# Patient Record
Sex: Male | Born: 1945 | Race: Black or African American | Hispanic: No | Marital: Single | Smoking: Former smoker
Health system: Southern US, Community
[De-identification: ages and names within clinical notes are randomized; demographics above are authoritative.]

## PROBLEM LIST (undated history)

## (undated) DIAGNOSIS — J9311 Primary spontaneous pneumothorax: Secondary | ICD-10-CM

## (undated) DIAGNOSIS — J439 Emphysema, unspecified: Secondary | ICD-10-CM

## (undated) DIAGNOSIS — Z789 Other specified health status: Secondary | ICD-10-CM

## (undated) DIAGNOSIS — R918 Other nonspecific abnormal finding of lung field: Secondary | ICD-10-CM

## (undated) DIAGNOSIS — Z87898 Personal history of other specified conditions: Secondary | ICD-10-CM

## (undated) DIAGNOSIS — F32A Depression, unspecified: Secondary | ICD-10-CM

## (undated) DIAGNOSIS — F200 Paranoid schizophrenia: Secondary | ICD-10-CM

## (undated) DIAGNOSIS — C3490 Malignant neoplasm of unspecified part of unspecified bronchus or lung: Secondary | ICD-10-CM

## (undated) DIAGNOSIS — Z5189 Encounter for other specified aftercare: Secondary | ICD-10-CM

## (undated) DIAGNOSIS — F1010 Alcohol abuse, uncomplicated: Secondary | ICD-10-CM

## (undated) DIAGNOSIS — T7840XA Allergy, unspecified, initial encounter: Secondary | ICD-10-CM

## (undated) DIAGNOSIS — K219 Gastro-esophageal reflux disease without esophagitis: Secondary | ICD-10-CM

## (undated) DIAGNOSIS — D689 Coagulation defect, unspecified: Secondary | ICD-10-CM

## (undated) DIAGNOSIS — F419 Anxiety disorder, unspecified: Secondary | ICD-10-CM

## (undated) DIAGNOSIS — F329 Major depressive disorder, single episode, unspecified: Secondary | ICD-10-CM

## (undated) DIAGNOSIS — D649 Anemia, unspecified: Secondary | ICD-10-CM

## (undated) DIAGNOSIS — F17200 Nicotine dependence, unspecified, uncomplicated: Secondary | ICD-10-CM

## (undated) DIAGNOSIS — E785 Hyperlipidemia, unspecified: Secondary | ICD-10-CM

## (undated) HISTORY — DX: Emphysema, unspecified: J43.9

## (undated) HISTORY — DX: Anemia, unspecified: D64.9

## (undated) HISTORY — PX: COLONOSCOPY: SHX174

## (undated) HISTORY — DX: Other specified health status: Z78.9

## (undated) HISTORY — DX: Major depressive disorder, single episode, unspecified: F32.9

## (undated) HISTORY — DX: Anxiety disorder, unspecified: F41.9

## (undated) HISTORY — DX: Coagulation defect, unspecified: D68.9

## (undated) HISTORY — DX: Hyperlipidemia, unspecified: E78.5

## (undated) HISTORY — DX: Gastro-esophageal reflux disease without esophagitis: K21.9

## (undated) HISTORY — DX: Encounter for other specified aftercare: Z51.89

## (undated) HISTORY — DX: Depression, unspecified: F32.A

## (undated) HISTORY — PX: APPENDECTOMY: SHX54

## (undated) HISTORY — PX: CHEST TUBE INSERTION: SHX231

## (undated) HISTORY — PX: INSERTION CENTRAL VENOUS ACCESS DEVICE W/ SUBCUTANEOUS PORT: SUR725

## (undated) HISTORY — DX: Malignant neoplasm of unspecified part of unspecified bronchus or lung: C34.90

---

## 2003-01-24 ENCOUNTER — Emergency Department (HOSPITAL_COMMUNITY): Admission: EM | Admit: 2003-01-24 | Discharge: 2003-01-24 | Payer: Self-pay | Admitting: Emergency Medicine

## 2004-02-15 ENCOUNTER — Ambulatory Visit: Payer: Self-pay | Admitting: Family Medicine

## 2004-08-12 ENCOUNTER — Ambulatory Visit: Payer: Self-pay | Admitting: Family Medicine

## 2004-10-18 ENCOUNTER — Ambulatory Visit: Payer: Self-pay | Admitting: Family Medicine

## 2004-11-16 ENCOUNTER — Ambulatory Visit: Payer: Self-pay | Admitting: Family Medicine

## 2005-01-04 ENCOUNTER — Ambulatory Visit: Payer: Self-pay | Admitting: Family Medicine

## 2005-01-04 ENCOUNTER — Ambulatory Visit (HOSPITAL_COMMUNITY): Admission: RE | Admit: 2005-01-04 | Discharge: 2005-01-04 | Payer: Self-pay | Admitting: Family Medicine

## 2005-02-06 ENCOUNTER — Ambulatory Visit: Payer: Self-pay | Admitting: Family Medicine

## 2005-03-17 ENCOUNTER — Ambulatory Visit (HOSPITAL_COMMUNITY): Admission: RE | Admit: 2005-03-17 | Discharge: 2005-03-17 | Payer: Self-pay | Admitting: Gastroenterology

## 2005-04-14 ENCOUNTER — Ambulatory Visit: Payer: Self-pay | Admitting: Internal Medicine

## 2005-05-03 ENCOUNTER — Ambulatory Visit (HOSPITAL_COMMUNITY): Admission: RE | Admit: 2005-05-03 | Discharge: 2005-05-03 | Payer: Self-pay | Admitting: Internal Medicine

## 2005-05-16 ENCOUNTER — Ambulatory Visit: Payer: Self-pay | Admitting: Family Medicine

## 2005-09-18 ENCOUNTER — Ambulatory Visit: Payer: Self-pay | Admitting: Family Medicine

## 2005-10-23 ENCOUNTER — Ambulatory Visit: Payer: Self-pay | Admitting: Family Medicine

## 2005-11-03 ENCOUNTER — Encounter: Payer: Self-pay | Admitting: Vascular Surgery

## 2005-11-03 ENCOUNTER — Ambulatory Visit: Admission: RE | Admit: 2005-11-03 | Discharge: 2005-11-03 | Payer: Self-pay | Admitting: Family Medicine

## 2005-11-03 ENCOUNTER — Ambulatory Visit: Payer: Self-pay | Admitting: Family Medicine

## 2005-11-20 ENCOUNTER — Ambulatory Visit: Payer: Self-pay | Admitting: Family Medicine

## 2006-02-19 ENCOUNTER — Ambulatory Visit: Payer: Self-pay | Admitting: Family Medicine

## 2006-03-02 ENCOUNTER — Ambulatory Visit: Payer: Self-pay | Admitting: Family Medicine

## 2006-04-23 ENCOUNTER — Ambulatory Visit: Payer: Self-pay | Admitting: Family Medicine

## 2006-08-27 ENCOUNTER — Ambulatory Visit: Payer: Self-pay | Admitting: Family Medicine

## 2006-12-11 ENCOUNTER — Ambulatory Visit: Payer: Self-pay | Admitting: Family Medicine

## 2006-12-19 DIAGNOSIS — I6529 Occlusion and stenosis of unspecified carotid artery: Secondary | ICD-10-CM | POA: Insufficient documentation

## 2006-12-19 DIAGNOSIS — E785 Hyperlipidemia, unspecified: Secondary | ICD-10-CM | POA: Insufficient documentation

## 2006-12-19 DIAGNOSIS — Z87898 Personal history of other specified conditions: Secondary | ICD-10-CM | POA: Insufficient documentation

## 2006-12-19 DIAGNOSIS — F1910 Other psychoactive substance abuse, uncomplicated: Secondary | ICD-10-CM | POA: Insufficient documentation

## 2006-12-19 HISTORY — DX: Personal history of other specified conditions: Z87.898

## 2007-02-18 ENCOUNTER — Ambulatory Visit: Payer: Self-pay | Admitting: Family Medicine

## 2007-02-18 DIAGNOSIS — I672 Cerebral atherosclerosis: Secondary | ICD-10-CM | POA: Insufficient documentation

## 2007-02-18 DIAGNOSIS — R634 Abnormal weight loss: Secondary | ICD-10-CM | POA: Insufficient documentation

## 2007-02-18 LAB — CONVERTED CEMR LAB
ALT: 11 units/L (ref 0–53)
AST: 25 units/L (ref 0–37)
Albumin: 4.7 g/dL (ref 3.5–5.2)
Alkaline Phosphatase: 63 units/L (ref 39–117)
BUN: 12 mg/dL (ref 6–23)
CO2: 22 meq/L (ref 19–32)
Calcium: 9.4 mg/dL (ref 8.4–10.5)
Chloride: 104 meq/L (ref 96–112)
Cholesterol: 148 mg/dL (ref 0–200)
Creatinine, Ser: 0.97 mg/dL (ref 0.40–1.50)
Glucose, Bld: 80 mg/dL (ref 70–99)
HDL: 78 mg/dL (ref >39)
LDL Cholesterol: 55 mg/dL (ref 0–99)
Potassium: 4.6 meq/L (ref 3.5–5.3)
Sodium: 139 meq/L (ref 135–145)
TSH: 1.298 microintl units/mL (ref 0.350–5.50)
Total Bilirubin: 0.3 mg/dL (ref 0.3–1.2)
Total CHOL/HDL Ratio: 1.9
Total Protein: 8.2 g/dL (ref 6.0–8.3)
Triglycerides: 77 mg/dL (ref <150)
VLDL: 15 mg/dL (ref 0–40)

## 2007-02-22 ENCOUNTER — Telehealth (INDEPENDENT_AMBULATORY_CARE_PROVIDER_SITE_OTHER): Payer: Self-pay | Admitting: Internal Medicine

## 2007-04-23 ENCOUNTER — Ambulatory Visit: Payer: Self-pay | Admitting: Family Medicine

## 2007-11-11 ENCOUNTER — Ambulatory Visit: Payer: Self-pay | Admitting: Family Medicine

## 2007-11-12 ENCOUNTER — Ambulatory Visit: Payer: Self-pay | Admitting: Family Medicine

## 2007-11-12 DIAGNOSIS — D649 Anemia, unspecified: Secondary | ICD-10-CM | POA: Insufficient documentation

## 2007-11-12 LAB — CONVERTED CEMR LAB
ALT: 8 units/L (ref 0–53)
AST: 21 units/L (ref 0–37)
Albumin: 4 g/dL (ref 3.5–5.2)
Alkaline Phosphatase: 75 units/L (ref 39–117)
BUN: 8 mg/dL (ref 6–23)
Basophils Absolute: 0 10*3/uL (ref 0.0–0.1)
Basophils Absolute: 0 10*3/uL (ref 0.0–0.1)
Basophils Relative: 1 % (ref 0–1)
Basophils Relative: 0 % (ref 0–1)
CO2: 19 meq/L (ref 19–32)
Calcium: 8.6 mg/dL (ref 8.4–10.5)
Chloride: 107 meq/L (ref 96–112)
Cholesterol: 147 mg/dL (ref 0–200)
Creatinine, Ser: 0.77 mg/dL (ref 0.40–1.50)
Eosinophils Absolute: 0.3 10*3/uL (ref 0.0–0.7)
Eosinophils Absolute: 0.2 10*3/uL (ref 0.0–0.7)
Eosinophils Relative: 5 % (ref 0–5)
Eosinophils Relative: 4 % (ref 0–5)
Glucose, Bld: 85 mg/dL (ref 70–99)
HCT: 23.9 % — ABNORMAL LOW (ref 39.0–52.0)
HCT: 27 %
HCT: 25.5 % — ABNORMAL LOW (ref 39.0–52.0)
HDL: 72 mg/dL (ref >39)
Hemoglobin: 6.6 g/dL — CL (ref 13.0–17.0)
Hemoglobin: 6.6 g/dL — CL (ref 13.0–17.0)
Iron: 12 ug/dL — ABNORMAL LOW (ref 42–165)
LDL Cholesterol: 58 mg/dL (ref 0–99)
Lymphocytes Relative: 29 % (ref 12–46)
Lymphocytes Relative: 22 % (ref 12–46)
Lymphs Abs: 1.6 10*3/uL (ref 0.7–4.0)
Lymphs Abs: 1.3 10*3/uL (ref 0.7–4.0)
MCHC: 27.6 g/dL — ABNORMAL LOW (ref 30.0–36.0)
MCHC: 25.9 g/dL — ABNORMAL LOW (ref 30.0–36.0)
MCV: 74.5 fL — ABNORMAL LOW (ref 78.0–100.0)
MCV: 74.8 fL — ABNORMAL LOW (ref 78.0–100.0)
Monocytes Absolute: 0.5 10*3/uL (ref 0.1–1.0)
Monocytes Absolute: 0.5 10*3/uL (ref 0.1–1.0)
Monocytes Relative: 9 % (ref 3–12)
Monocytes Relative: 9 % (ref 3–12)
Neutro Abs: 3.1 10*3/uL (ref 1.7–7.7)
Neutro Abs: 3.7 10*3/uL (ref 1.7–7.7)
Neutrophils Relative %: 57 % (ref 43–77)
Neutrophils Relative %: 65 % (ref 43–77)
Platelets: 308 10*3/uL (ref 150–400)
Platelets: 344 10*3/uL (ref 150–400)
Potassium: 4.7 meq/L (ref 3.5–5.3)
RBC: 3.21 M/uL — ABNORMAL LOW (ref 4.22–5.81)
RBC: 3.41 M/uL — ABNORMAL LOW (ref 4.22–5.81)
RDW: 20.7 % — ABNORMAL HIGH (ref 11.5–15.5)
RDW: 21 % — ABNORMAL HIGH (ref 11.5–15.5)
Retic Ct Pct: 1.2 % (ref 0.4–3.1)
Saturation Ratios: 3 % — ABNORMAL LOW (ref 20–55)
Sodium: 140 meq/L (ref 135–145)
TIBC: 402 ug/dL (ref 215–435)
TSH: 2.632 microintl units/mL (ref 0.350–4.50)
Total Bilirubin: 0.2 mg/dL — ABNORMAL LOW (ref 0.3–1.2)
Total CHOL/HDL Ratio: 2
Total Protein: 7.4 g/dL (ref 6.0–8.3)
Triglycerides: 87 mg/dL (ref <150)
UIBC: 390 ug/dL
VLDL: 17 mg/dL (ref 0–40)
WBC: 5.5 10*3/uL (ref 4.0–10.5)
WBC: 5.7 10*3/uL (ref 4.0–10.5)

## 2007-11-13 ENCOUNTER — Encounter (HOSPITAL_COMMUNITY): Admission: RE | Admit: 2007-11-13 | Discharge: 2008-01-08 | Payer: Self-pay | Admitting: Family Medicine

## 2008-03-04 ENCOUNTER — Ambulatory Visit: Payer: Self-pay | Admitting: Family Medicine

## 2008-03-04 ENCOUNTER — Encounter (INDEPENDENT_AMBULATORY_CARE_PROVIDER_SITE_OTHER): Payer: Self-pay | Admitting: *Deleted

## 2008-03-05 ENCOUNTER — Telehealth (INDEPENDENT_AMBULATORY_CARE_PROVIDER_SITE_OTHER): Payer: Self-pay | Admitting: Family Medicine

## 2008-03-05 LAB — CONVERTED CEMR LAB
Basophils Absolute: 0 10*3/uL (ref 0.0–0.1)
Basophils Relative: 1 % (ref 0–1)
Eosinophils Absolute: 0.2 10*3/uL (ref 0.0–0.7)
Eosinophils Relative: 3 % (ref 0–5)
HCT: 22.4 % — ABNORMAL LOW (ref 39.0–52.0)
Hemoglobin: 6 g/dL — CL (ref 13.0–17.0)
Lymphocytes Relative: 23 % (ref 12–46)
Lymphs Abs: 1.6 10*3/uL (ref 0.7–4.0)
MCHC: 26.8 g/dL — ABNORMAL LOW (ref 30.0–36.0)
MCV: 78.6 fL (ref 78.0–100.0)
Monocytes Absolute: 0.6 10*3/uL (ref 0.1–1.0)
Monocytes Relative: 8 % (ref 3–12)
Neutro Abs: 4.6 10*3/uL (ref 1.7–7.7)
Neutrophils Relative %: 66 % (ref 43–77)
Platelets: 330 10*3/uL (ref 150–400)
RBC: 2.85 M/uL — ABNORMAL LOW (ref 4.22–5.81)
RDW: 19.4 % — ABNORMAL HIGH (ref 11.5–15.5)
WBC: 6.9 10*3/uL (ref 4.0–10.5)

## 2008-03-06 ENCOUNTER — Encounter (INDEPENDENT_AMBULATORY_CARE_PROVIDER_SITE_OTHER): Payer: Self-pay | Admitting: Family Medicine

## 2008-03-09 ENCOUNTER — Ambulatory Visit (HOSPITAL_COMMUNITY): Admission: RE | Admit: 2008-03-09 | Discharge: 2008-03-09 | Payer: Self-pay | Admitting: Family Medicine

## 2008-03-16 ENCOUNTER — Encounter (INDEPENDENT_AMBULATORY_CARE_PROVIDER_SITE_OTHER): Payer: Self-pay | Admitting: Family Medicine

## 2008-03-16 ENCOUNTER — Ambulatory Visit (HOSPITAL_COMMUNITY): Admission: RE | Admit: 2008-03-16 | Discharge: 2008-03-16 | Payer: Self-pay | Admitting: Gastroenterology

## 2008-03-16 DIAGNOSIS — Q279 Congenital malformation of peripheral vascular system, unspecified: Secondary | ICD-10-CM | POA: Insufficient documentation

## 2008-03-20 ENCOUNTER — Telehealth (INDEPENDENT_AMBULATORY_CARE_PROVIDER_SITE_OTHER): Payer: Self-pay | Admitting: Family Medicine

## 2008-05-25 ENCOUNTER — Telehealth (INDEPENDENT_AMBULATORY_CARE_PROVIDER_SITE_OTHER): Payer: Self-pay | Admitting: Family Medicine

## 2008-05-29 ENCOUNTER — Encounter (INDEPENDENT_AMBULATORY_CARE_PROVIDER_SITE_OTHER): Payer: Self-pay | Admitting: Family Medicine

## 2008-08-26 ENCOUNTER — Ambulatory Visit: Payer: Self-pay | Admitting: Family Medicine

## 2008-08-27 ENCOUNTER — Encounter (INDEPENDENT_AMBULATORY_CARE_PROVIDER_SITE_OTHER): Payer: Self-pay | Admitting: Family Medicine

## 2008-08-27 LAB — CONVERTED CEMR LAB
ALT: 11 units/L (ref 0–53)
AST: 23 units/L (ref 0–37)
Albumin: 4.2 g/dL (ref 3.5–5.2)
Alkaline Phosphatase: 74 units/L (ref 39–117)
BUN: 9 mg/dL (ref 6–23)
Basophils Absolute: 0 10*3/uL (ref 0.0–0.1)
Basophils Relative: 0 % (ref 0–1)
CO2: 26 meq/L (ref 19–32)
Calcium: 9.5 mg/dL (ref 8.4–10.5)
Chloride: 104 meq/L (ref 96–112)
Cholesterol: 188 mg/dL (ref 0–200)
Creatinine, Ser: 0.97 mg/dL (ref 0.40–1.50)
Eosinophils Absolute: 0.2 10*3/uL (ref 0.0–0.7)
Eosinophils Relative: 2 % (ref 0–5)
Glucose, Bld: 86 mg/dL (ref 70–99)
HCT: 31.3 % — ABNORMAL LOW (ref 39.0–52.0)
HDL: 75 mg/dL (ref >39)
Hemoglobin: 9.5 g/dL — ABNORMAL LOW (ref 13.0–17.0)
LDL Cholesterol: 97 mg/dL (ref 0–99)
Lymphocytes Relative: 17 % (ref 12–46)
Lymphs Abs: 1.3 10*3/uL (ref 0.7–4.0)
MCHC: 30.4 g/dL (ref 30.0–36.0)
MCV: 97.2 fL (ref 78.0–100.0)
Monocytes Absolute: 0.6 10*3/uL (ref 0.1–1.0)
Monocytes Relative: 7 % (ref 3–12)
Neutro Abs: 6 10*3/uL (ref 1.7–7.7)
Neutrophils Relative %: 74 % (ref 43–77)
Platelets: 318 10*3/uL (ref 150–400)
Potassium: 4.9 meq/L (ref 3.5–5.3)
RBC: 3.22 M/uL — ABNORMAL LOW (ref 4.22–5.81)
RDW: 19.6 % — ABNORMAL HIGH (ref 11.5–15.5)
Sodium: 141 meq/L (ref 135–145)
Total Bilirubin: 0.2 mg/dL — ABNORMAL LOW (ref 0.3–1.2)
Total CHOL/HDL Ratio: 2.5
Total Protein: 7.5 g/dL (ref 6.0–8.3)
Triglycerides: 79 mg/dL (ref <150)
VLDL: 16 mg/dL (ref 0–40)
WBC: 8.1 10*3/uL (ref 4.0–10.5)

## 2008-11-19 ENCOUNTER — Ambulatory Visit: Payer: Self-pay | Admitting: Nurse Practitioner

## 2008-11-19 DIAGNOSIS — R040 Epistaxis: Secondary | ICD-10-CM | POA: Insufficient documentation

## 2009-01-25 ENCOUNTER — Telehealth: Payer: Self-pay | Admitting: Physician Assistant

## 2009-01-26 ENCOUNTER — Ambulatory Visit: Payer: Self-pay | Admitting: Physician Assistant

## 2009-01-28 ENCOUNTER — Encounter: Payer: Self-pay | Admitting: Physician Assistant

## 2009-01-28 LAB — CONVERTED CEMR LAB
ALT: 13 units/L (ref 0–53)
AST: 24 units/L (ref 0–37)
Albumin: 4.4 g/dL (ref 3.5–5.2)
Alkaline Phosphatase: 66 units/L (ref 39–117)
BUN: 10 mg/dL (ref 6–23)
Basophils Absolute: 0 10*3/uL (ref 0.0–0.1)
Basophils Relative: 0 % (ref 0–1)
CO2: 23 meq/L (ref 19–32)
Calcium: 8.9 mg/dL (ref 8.4–10.5)
Chloride: 106 meq/L (ref 96–112)
Cholesterol: 142 mg/dL (ref 0–200)
Creatinine, Ser: 0.9 mg/dL (ref 0.40–1.50)
Eosinophils Absolute: 0.1 10*3/uL (ref 0.0–0.7)
Eosinophils Relative: 2 % (ref 0–5)
Glucose, Bld: 91 mg/dL (ref 70–99)
HCT: 35.8 % — ABNORMAL LOW (ref 39.0–52.0)
HDL: 80 mg/dL (ref >39)
Hemoglobin: 11.8 g/dL — ABNORMAL LOW (ref 13.0–17.0)
LDL Cholesterol: 47 mg/dL (ref 0–99)
Lymphocytes Relative: 18 % (ref 12–46)
Lymphs Abs: 1.4 10*3/uL (ref 0.7–4.0)
MCHC: 33 g/dL (ref 30.0–36.0)
MCV: 100.6 fL — ABNORMAL HIGH (ref 78.0–100.0)
Monocytes Absolute: 0.5 10*3/uL (ref 0.1–1.0)
Monocytes Relative: 7 % (ref 3–12)
Neutro Abs: 5.5 10*3/uL (ref 1.7–7.7)
Neutrophils Relative %: 73 % (ref 43–77)
Platelets: 247 10*3/uL (ref 150–400)
Potassium: 4.3 meq/L (ref 3.5–5.3)
RBC: 3.56 M/uL — ABNORMAL LOW (ref 4.22–5.81)
RDW: 16.7 % — ABNORMAL HIGH (ref 11.5–15.5)
Sodium: 142 meq/L (ref 135–145)
Total Bilirubin: 0.4 mg/dL (ref 0.3–1.2)
Total CHOL/HDL Ratio: 1.8
Total Protein: 7.2 g/dL (ref 6.0–8.3)
Triglycerides: 74 mg/dL (ref <150)
VLDL: 15 mg/dL (ref 0–40)
WBC: 7.5 10*3/uL (ref 4.0–10.5)

## 2009-02-01 ENCOUNTER — Encounter: Payer: Self-pay | Admitting: Physician Assistant

## 2009-02-01 ENCOUNTER — Ambulatory Visit: Payer: Self-pay | Admitting: Internal Medicine

## 2009-02-01 LAB — CONVERTED CEMR LAB
Ferritin: 25 ng/mL (ref 22–322)
Folate: 15.3 ng/mL
Vit D, 25-Hydroxy: 55 ng/mL (ref 30–89)
Vitamin B-12: 632 pg/mL (ref 211–911)

## 2009-03-03 ENCOUNTER — Encounter: Payer: Self-pay | Admitting: Physician Assistant

## 2009-04-16 ENCOUNTER — Encounter: Payer: Self-pay | Admitting: Physician Assistant

## 2009-05-17 ENCOUNTER — Telehealth: Payer: Self-pay | Admitting: Physician Assistant

## 2009-05-24 ENCOUNTER — Ambulatory Visit: Payer: Self-pay | Admitting: Nurse Practitioner

## 2009-05-24 DIAGNOSIS — F17200 Nicotine dependence, unspecified, uncomplicated: Secondary | ICD-10-CM

## 2009-05-24 HISTORY — DX: Nicotine dependence, unspecified, uncomplicated: F17.200

## 2009-05-24 LAB — CONVERTED CEMR LAB
Cholesterol, target level: 200 mg/dL
HCT: 41.6 % (ref 39.0–52.0)
HDL goal, serum: 40 mg/dL
Hemoglobin: 12.9 g/dL — ABNORMAL LOW (ref 13.0–17.0)
LDL Goal: 160 mg/dL
MCHC: 31 g/dL (ref 30.0–36.0)
MCV: 100.2 fL — ABNORMAL HIGH (ref 78.0–100.0)
Platelets: 253 10*3/uL (ref 150–400)
RBC: 4.15 M/uL — ABNORMAL LOW (ref 4.22–5.81)
RDW: 16.9 % — ABNORMAL HIGH (ref 11.5–15.5)
Retic Ct Pct: 1.5 % (ref 0.4–3.1)
WBC: 5.4 10*3/uL (ref 4.0–10.5)

## 2009-05-25 ENCOUNTER — Encounter (INDEPENDENT_AMBULATORY_CARE_PROVIDER_SITE_OTHER): Payer: Self-pay | Admitting: Nurse Practitioner

## 2009-09-03 ENCOUNTER — Telehealth: Payer: Self-pay | Admitting: Physician Assistant

## 2009-10-25 ENCOUNTER — Encounter: Payer: Self-pay | Admitting: Physician Assistant

## 2009-10-31 ENCOUNTER — Encounter: Payer: Self-pay | Admitting: Physician Assistant

## 2009-10-31 DIAGNOSIS — F200 Paranoid schizophrenia: Secondary | ICD-10-CM | POA: Insufficient documentation

## 2009-10-31 HISTORY — DX: Paranoid schizophrenia: F20.0

## 2009-11-12 ENCOUNTER — Ambulatory Visit: Payer: Self-pay | Admitting: Nurse Practitioner

## 2009-11-12 DIAGNOSIS — F1010 Alcohol abuse, uncomplicated: Secondary | ICD-10-CM

## 2009-11-12 HISTORY — DX: Alcohol abuse, uncomplicated: F10.10

## 2009-11-15 LAB — CONVERTED CEMR LAB
HCT: 38.3 % — ABNORMAL LOW (ref 39.0–52.0)
Hemoglobin: 12.4 g/dL — ABNORMAL LOW (ref 13.0–17.0)
MCHC: 32.4 g/dL (ref 30.0–36.0)
MCV: 102.4 fL — ABNORMAL HIGH (ref 78.0–100.0)
Platelets: 236 10*3/uL (ref 150–400)
RBC: 3.74 M/uL — ABNORMAL LOW (ref 4.22–5.81)
RDW: 14.4 % (ref 11.5–15.5)
Retic Ct Pct: 2.7 % (ref 0.4–3.1)
WBC: 5.9 10*3/uL (ref 4.0–10.5)

## 2009-12-06 ENCOUNTER — Telehealth (INDEPENDENT_AMBULATORY_CARE_PROVIDER_SITE_OTHER): Payer: Self-pay | Admitting: Nurse Practitioner

## 2010-02-15 ENCOUNTER — Encounter (INDEPENDENT_AMBULATORY_CARE_PROVIDER_SITE_OTHER): Payer: Self-pay | Admitting: Nurse Practitioner

## 2010-03-15 ENCOUNTER — Ambulatory Visit: Payer: Self-pay | Admitting: Nurse Practitioner

## 2010-04-08 ENCOUNTER — Encounter (INDEPENDENT_AMBULATORY_CARE_PROVIDER_SITE_OTHER): Payer: Self-pay | Admitting: Nurse Practitioner

## 2010-04-12 ENCOUNTER — Encounter (INDEPENDENT_AMBULATORY_CARE_PROVIDER_SITE_OTHER): Payer: Self-pay | Admitting: Nurse Practitioner

## 2010-04-12 DIAGNOSIS — S98139A Complete traumatic amputation of one unspecified lesser toe, initial encounter: Secondary | ICD-10-CM | POA: Insufficient documentation

## 2010-04-12 DIAGNOSIS — L851 Acquired keratosis [keratoderma] palmaris et plantaris: Secondary | ICD-10-CM | POA: Insufficient documentation

## 2010-06-07 NOTE — Progress Notes (Signed)
Summary: Plavix refill  Phone Note Call from Patient Call back at [facility] Phone [phone]   Caller: Patient Summary of Call: The pt wants to know if the provider can [facility] [address] Pladix medication Dr. Barbaraann Barthel Initial call taken by: Manon Hilding,  May 25, 2008 12:27 PM  Follow-up for Phone Call        left message to return call....Marland KitchenMarland KitchenMikey College CMA  May 25, 2008 4:56 PM   Mr Hayashi needs a refill on Plavix 75mg  [facility]....his sister states she called the pharmacy last week. Did advise I would send request to provider and will call her when completed.......Marland KitchenMikey College CMA  May 26, 2008 3:00 PM   done.Beverley Fiedler MD  May 26, 2008 5:23 PM  Follow-up by: Beverley Fiedler MD,  May 26, 2008 5:23 PM      Prescriptions: LIPITOR 20 MG TABS (ATORVASTATIN CALCIUM) 1 by mouth once daily  #30 Tablet x 5   Entered and Authorized by:   Beverley Fiedler MD   Signed by:   Beverley Fiedler MD on 05/26/2008   Method used:   Electronically to        [facility] [facility] [address] [phone]* (retail)       [address]       [place], [place]  [address]       Ph: [phone] or [phone]       Fax: [phone]   RxID:   [id number] PLAVIX 75 MG TABS (CLOPIDOGREL BISULFATE) 1 by mouth once daily  #30 x 5   Entered and Authorized by:   Beverley Fiedler MD   Signed by:   Beverley Fiedler MD on 05/26/2008   Method used:   Electronically to        [facility] [facility] [address] [phone]* (retail)       [address]       [place], [place]  [address]       Ph: [phone] or [phone]       Fax: [phone]   RxID:   [id number]

## 2010-06-07 NOTE — Letter (Signed)
Summary: psychiatry notes  psychiatry notes   Imported By: Arta Bruce 11/02/2009 14:38:59  _____________________________________________________________________  External Attachment:    Type:   Image     Comment:   External Document

## 2010-06-07 NOTE — Letter (Signed)
Summary: *HSN Results Follow up  [facility]  [address] [place], [place] [address]   Phone: [phone]  Fax: [phone]      05/25/2009   Ivan Daniels [address] [place], [place]  [address]   Dear  Mr. Ivan Daniels,                            ____S.Drinkard,FNP   ____D. Gore,FNP       ____B. McPherson,MD   ____V. Rankins,MD    ____E. Mulberry,MD    _X___N. Daphine Deutscher, FNP  ____D. Reche Dixon, MD    ____K. Philipp Deputy, MD    ____Other     This letter is to inform you that your recent test(s):  _______Pap Smear    ____X___Lab Test     _______X-ray   Comments: Labs done during recent office visit still show  your are anemic.  Continue to take the iron supplements three times per day.       _________________________________________________________ If you have any questions, please contact our office [phone].                    Sincerely,    Ivan Prom FNP [facility]

## 2010-06-07 NOTE — Progress Notes (Signed)
  Phone Note From Pharmacy   Summary of Call: [facility] [address]  Filled Oct. 4--if they did not receive-can have Plavix  #30 with 4 refills. Initial call taken by: Julieanne Manson MD,  February 22, 2007 8:40 AM  Follow-up for Phone Call        pharmacy indicated it was filled 10/14 with 3 refills Follow-up by: Gaylyn Cheers RN,  February 25, 2007 4:40 PM

## 2010-06-07 NOTE — Progress Notes (Signed)
Summary: REFILL ON PLAVIX  Phone Note Call from Patient   Caller: SISTER-DOROTHY Reason for Call: Refill Medication Summary of Call: Denzell Colasanti PT. MS ALSTON CALLED AND SAYS [facility] THAT MR Nesby HAS NO MORE REFILLS ON HIS PLAVIX, AND SHE SAYS HE HAS ONLY 2 PILLS LEFT. [facility] [address] Initial call taken by: Leodis Rains,  September 03, 2009 8:32 AM  Follow-up for Phone Call        pt is aware Follow-up by: Armenia Shannon,  September 03, 2009 11:37 AM

## 2010-06-07 NOTE — Progress Notes (Signed)
Summary: REFILL ON HIS ASPRIN  Phone Note Call from Patient Call back at Home Phone [phone]   Reason for Call: Refill Medication Summary of Call: SCOTTS PT. MS DOROTHY ALSTON, MR Kohut SISTER STOPPED BY TO SEE IF HE CAN GET ANOTHER REFILL ON HIS ASPRIN [facility] [address] Initial call taken by: Leodis Rains,  May 17, 2009 8:39 AM  Follow-up for Phone Call        forward to provider Follow-up by: Armenia Shannon,  May 17, 2009 9:09 AM    New/Updated Medications: ASPIRIN 325 MG TABS (ASPIRIN) 1 by mouth once daily ASPIR-LOW 81 MG TBEC (ASPIRIN) Take 1 tablet by mouth once a day Prescriptions: ASPIR-LOW 81 MG TBEC (ASPIRIN) Take 1 tablet by mouth once a day  #30 x 11   Entered and Authorized by:   Tereso Newcomer PA-C   Signed by:   Tereso Newcomer PA-C on 05/17/2009   Method used:   Electronically to        [facility]  [address] [phone]* (retail)       [address]       [place], [place]  [id number]       Ph: [phone] or [phone]       Fax: [phone]   RxID:   [id number]

## 2010-06-07 NOTE — Assessment & Plan Note (Signed)
Summary: 3 month fu from July////kt   Vital Signs:  Patient Profile:   65 Years Old Male Height:     69.5 inches Weight:      126 pounds BMI:     18.41 Temp:     98.7 degrees F Pulse rate:   84 / minute Pulse rhythm:   regular Resp:     18 per minute BP sitting:   140 / 60  (left arm) Cuff size:   regular  Pt. in pain?   no  Vitals Entered By: Vesta Mixer CMA (March 04, 2008 10:47 AM)                  PCP:  Dayanara Sherrill  Chief Complaint:  f/u from having blood transfusion in July and would like to get a flu shot.  History of Present Illness: Here with sister. Pt last seen 7/09 just prior to his blood transfusion. He was not seen in immediate f/u and comes in today saying that he is doing well. Sister says his appetite is good and he is actually up 2 lbs from his 7/09 weight. No rectal bleeding or melena. No ill symptoms. His sister got him a multivitamin to take every day...unfortunately it does not have iron in it.  He was to have a f/u with his GI,Dr.John Madilyn Fireman, but did not have appt made. This was done today. Pt last saw Dr.Hayes in 01/26/2003 for colonoscopy(normal to 30 cm).    Prior Medications Reviewed Using: Medication Bottles  Current Allergies (reviewed today): ! 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      Physical Exam  General:     Well-developed,well-nourished,in no acute distress;  alert,appropriate and cooperative throughout examination. PT has mental disabilities at baseline and is a limited historian. Eyes:     non-icteric or non-injected sclerae. Neck:     no thyromegaly.   Lungs:     Normal respiratory effort, chest expands symmetrically. Lungs are clear to auscultation, no crackles or wheezes. Heart:     Normal rate and regular rhythm. S1 and S2 normal without gallop, murmur, click, rub or other extra sounds. Abdomen:     Bowel sounds positive,abdomen soft and non-tender without masses, organomegaly.    Impression & Recommendations:  Problem # 1:  UNSPECIFIED ANEMIA (ICD-285.9) Has had extensive lab w/u including lead,B12,folate,HgB electrophoresis. Needs re-evaluation by GI for new anemia and recent h/o weight loss(though this seems to have improved since 7/09). His updated medication list for this problem includes:    Ferrous Sulfate 325 (65 Fe) Mg Tbec (Ferrous sulfate) .Marland Kitchen... 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  Complete Medication List: 1)  Plavix 75 Mg Tabs (Clopidogrel bisulfate) .Marland Kitchen.. 1 by mouth once daily 2)  Aspirin 325 Mg Tabs (Aspirin) .Marland Kitchen.. 1 by mouth once daily 3)  Lipitor 20 Mg Tabs (Atorvastatin calcium) .Marland Kitchen.. 1 by mouth once daily 4)  Haloperidol 5 Mg Tabs (Haloperidol) .Marland Kitchen.. 1 by mouth once daily(per [facility]) 5)  Benztropine Mesylate 1 Mg Tabs (Benztropine mesylate) .Marland Kitchen.. 1 by mouth two times a day(per [facility]) 6)  Ferrous Sulfate 325 (65 Fe) Mg Tbec (Ferrous sulfate) .... Take one tablet by mouth three times a day   Patient Instructions: 1)  Take your iron pills three times each day...take with orange juice if possible. 2)  Take metamucil daily. 3)  Recommended in the community(we have no FLU shot supply).   Prescriptions: FERROUS SULFATE 325 (65 FE) MG TBEC (FERROUS SULFATE) Take one tablet by mouth three times a day   #90 x 3   Entered and Authorized by:   Beverley Fiedler MD   Signed by:   Beverley Fiedler MD on 03/04/2008   Method used:   Electronically to        [facility] [address] [phone]* (retail)       [address]       [place], [place]  [address]       Ph: [phone] or [phone]       Fax: [phone]   RxID:   [id number]  ]

## 2010-06-07 NOTE — Assessment & Plan Note (Signed)
Summary: PER DR Kamdon Reisig/ F/U OV/ WT//GK   Vital Signs:  Patient Profile:   65 Years Old Male Height:     69.5 inches Weight:      121 pounds BMI:     17.68 BSA:     1.68 Temp:     97 degrees F oral Pulse rate:   68 / minute Pulse rhythm:   regular Resp:     18 per minute BP sitting:   130 / 70  (left arm) Cuff size:   regular  Pt. in pain?   no  Vitals Entered By: Gaylyn Cheers RN (February 18, 2007 9:50 AM)              Is Patient Diabetic? No  Does patient need assistance? Ambulation Normal Comments brought all meds except ASA however pt stated he does take 325mg  daily, sister Kurtis Bushman) with pt.     PCP:  Grace Haggart  Chief Complaint:  F/U on weight loss.  History of Present Illness: Here with sister. Weght down 4 lbs but sister reports up from mid-summer. She feels he is eating better though is often gone during daylight hours and patient reports skips lunch ~3x each week. Still actively drinking. Sister lives with patient and monitors medication administration.Pt and sister report appetit is good. See ROS for further details. Pt had normal colonoscopy to 30cm by Dr.Hayes in 2004. Needs med refilled.  Current Allergies (reviewed today): ! 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    Risk Factors:  Tobacco use:  current    Year started:  1962    Cigarettes:  Yes Passive smoke exposure:  yes Drug use:  no HIV high-risk behavior:  no Caffeine use:  2 drinks per day Alcohol use:  no Exercise:  yes    Times per week:  7    Type:  walk Seatbelt use:  100  % Sun Exposure:  occasionally  Family History Risk Factors:    Family History of MI in females < 45 years old:  no    Family History of MI in males < 18 years old:  no  Colonoscopy History:    Date of Last Colonoscopy:  03/13/2005   Review of Systems       The patient complains of weight loss.  The patient denies anorexia, fever, chest pain, syncope, dyspnea on exhertion, peripheral edema, prolonged cough, hemoptysis, abdominal pain, melena, hematochezia, severe indigestion/heartburn, and hematuria.         No nausea/vomiting. Still spends alot of time away from home and skips lunch  ~ 3x per week.Sister feels that he is doing better.   Physical Exam  General:     alert, appropriate dress, normal appearance, good hygiene, and underweight appearing.   Lungs:     Normal respiratory effort, chest expands symmetrically. Lungs are clear to auscultation, no crackles or wheezes. Heart:     Normal rate and regular rhythm. S1 and S2 normal without gallop, murmur, click, rub or other extra sounds. Abdomen:     Bowel  sounds positive,abdomen soft and non-tender without masses, organomegaly.    Impression & Recommendations:  Problem # 1:  WEIGHT LOSS, ABNORMAL (ICD-783.21) Pt to take ensure supplement three times a day. Historically,Pt's weight increases during winter months when he no longer leaves the home for long periods of time(and therefore drinks less in the community). 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 His updated medication list for this problem includes:    Plavix 75 Mg Tabs (Clopidogrel bisulfate) .Marland Kitchen... 1 by mouth once daily    Aspirin 325 Mg Tabs (Aspirin) .Marland Kitchen... 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  Problem # 4:  DYSLIPIDEMIA (ICD-272.4)  His updated medication list for this problem includes:    Lipitor 20  Mg Tabs (Atorvastatin calcium) .Marland Kitchen... 1 by mouth once daily  Orders: T-Lipid Profile ([id number]) T-Comprehensive Metabolic Panel [phone])   Complete Medication List: 1)  Plavix 75 Mg Tabs (Clopidogrel bisulfate) .Marland Kitchen.. 1 by mouth once daily 2)  Aspirin 325 Mg Tabs (Aspirin) .Marland Kitchen.. 1 by mouth once daily 3)  Lipitor 20 Mg Tabs (Atorvastatin calcium) .Marland Kitchen.. 1 by mouth once daily 4)  Haloperidol 5 Mg Tabs (Haloperidol) .Marland Kitchen.. 1 by mouth once daily(per [facility]) 5)  Benztropine Mesylate 1 Mg Tabs (Benztropine mesylate) .Marland Kitchen.. 1 by mouth two times a day(per [facility])   Patient Instructions: 1)  Please schedule a follow-up appointment in2 months.    Prescriptions: LIPITOR 20 MG TABS (ATORVASTATIN CALCIUM) 1 by mouth once daily  #30 x 4   Entered and Authorized by:   Beverley Fiedler MD   Signed by:   Beverley Fiedler MD on 02/18/2007   Method used:   Electronically sent to ...       [facility] [address] [phone]*       [address]       [place], [place]  [address]       Ph: [phone] or [phone]       Fax: [phone]   RxID:   [id number] ASPIRIN 325 MG TABS (ASPIRIN) 1 by mouth once daily  #30 x 4   Entered and Authorized by:   Beverley Fiedler MD   Signed by:   Beverley Fiedler MD on 02/18/2007   Method used:   Electronically sent to ...       [facility]  [facility] [address] [phone]*       [address]       [place], [place]  [address]       Ph: [phone] or [phone]       Fax: [phone]   RxID:   [id number] PLAVIX 75 MG TABS (CLOPIDOGREL BISULFATE) 1 by mouth once daily  #30 x 4   Entered and Authorized by:   Beverley Fiedler MD   Signed by:   Beverley Fiedler MD on 02/18/2007   Method used:   Electronically sent to ...       [facility]  [facility] [address] [phone]*       [address]       [place], [place]  [address]       Ph: [phone] or [phone]        Fax: [phone]   RxID:  [id number]  ] 

## 2010-06-07 NOTE — Assessment & Plan Note (Signed)
Summary: 4 month F/u   Vital Signs:  Patient profile:   65 year old male Height:      69.50 inches Weight:      122.5 pounds BMI:     17.90 Temp:     97.4 degrees F oral Pulse rate:   76 / minute Pulse rhythm:   regular Resp:     18 per minute BP sitting:   108 / 50  (left arm) Cuff size:   regular  Vitals Entered By: Armenia Shannon (March 15, 2010 8:11 AM) CC: four month f/u...., Lipid Management Is Patient Diabetic? No Pain Assessment Patient in pain? no       Does patient need assistance? Functional Status Self care Ambulation Normal   Primary Care Provider:  Lehman Prom FNP  CC:  four month f/u.... and Lipid Management.  History of Present Illness:  Pt into the office for routine f/u.  Weight - up 4 pounds since last visit.  Pt admits that he is trying to do better about his diet.    Presents today with his sister with whom he lives and she is his caretaker Pt presents today with all his medications.  Mental health - Ongoing f/u and medication management at mental health  BPH - Ongoing f/u with Dr. Julien Girt.  Last visit in September 2011. Pt reports that prostate exam is done at that office.  Pt in his usual state of health - no acute problems today  Lipid Management History:      Positive NCEP/ATP III risk factors include male age 71 years old or older, current tobacco user, and peripheral vascular disease.  Negative NCEP/ATP III risk factors include non-diabetic, HDL cholesterol greater than 60, no family history for ischemic heart disease, non-hypertensive, no ASHD (atherosclerotic heart disease), no prior stroke/TIA, and no history of aortic aneurysm.        The patient states that he does not know about the "Therapeutic Lifestyle Change" diet.  The patient does not know about adjunctive measures for cholesterol lowering.  He expresses no side effects from his lipid-lowering medication.  Comments include: Pt is taking meds as ordered.  The patient denies  any symptoms to suggest myopathy or liver disease.    Habits & Providers  Alcohol-Tobacco-Diet     Alcohol drinks/day: 0     Tobacco Status: current     Tobacco Counseling: to quit use of tobacco products     Cigarette Packs/Day: 0.25     Year Started: age 43  Exercise-Depression-Behavior     Does Patient Exercise: yes     Exercise Counseling: to improve exercise regimen     Type of exercise: walk     Times/week: 7     Have you felt down or hopeless? no     Have you felt little pleasure in things? no     Depression Counseling: not indicated; screening negative for depression     Drug Use: no     Seat Belt Use: 100     Sun Exposure: occasionally  Current Medications (verified): 1)  Plavix 75 Mg Tabs (Clopidogrel Bisulfate) .Marland Kitchen.. 1 By Mouth Once Daily 2)  Aspirin 325 Mg Tabs (Aspirin) .Marland Kitchen.. 1 By Mouth Once Daily 3)  Simvastatin 20 Mg Tabs (Simvastatin) .... One Tablet By Mouth Nightly For Cholesterol 4)  Haloperidol 5 Mg  Tabs (Haloperidol) .Marland Kitchen.. 1 By Mouth At Bedtime 5)  Benztropine Mesylate 1 Mg  Tabs (Benztropine Mesylate) .Marland Kitchen.. 1 By Mouth Two Times A Day(Per  [facility]) 6)  Ferrous Sulfate 325 (65 Fe) Mg Tbec (Ferrous Sulfate) .... Take One Tablet By Mouth Three Times A Day 7)  Aspir-Low 81 Mg Tbec (Aspirin) .... Take 1 Tablet By Mouth Once A Day 8)  Azasite 1 % Soln (Azithromycin) .... One Drop in Each Eye Every Day  Allergies (verified): 1)  ! Cogentin (Benztropine Mesylate) 2)  ! Thorazine  Social History: Packs/Day:  0.25  Review of Systems General:  Denies fever. CV:  Denies chest pain or discomfort. Resp:  Denies cough. GI:  Denies nausea and vomiting. Neuro:  Denies headaches.  Physical Exam  General:  alert.   Head:  normocephalic.   Lungs:  normal breath sounds.   Heart:  normal rate and regular rhythm.   Abdomen:  normal bowel sounds.   Msk:  up to the exam Neurologic:  alert & oriented X3.   Skin:  color normal.   Psych:  Oriented X3.     Impression &  Recommendations:  Problem # 1:  DYSLIPIDEMIA (ICD-272.4) labs stable on last check continue current meds His updated medication list for this problem includes:    Simvastatin 20 Mg Tabs (Simvastatin) ..... One tablet by mouth nightly for cholesterol  Problem # 2:  NEED PROPHYLACTIC VACCINATION&INOCULATION FLU (ICD-V04.81) given in office today  Problem # 3:  TOBACCO ABUSE (ICD-305.1) advised cessation  Problem # 4:  UNSPECIFIED ANEMIA (ICD-285.9) continue current meds will recheck on next visit His updated medication list for this problem includes:    Ferrous Sulfate 325 (65 Fe) Mg Tbec (Ferrous sulfate) .Marland Kitchen... 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 1 by mouth once daily    Aspir-low 81 Mg Tbec (Aspirin) .Marland Kitchen... Take 1 tablet by mouth once a day  Complete Medication List: 1)  Plavix 75 Mg Tabs (Clopidogrel bisulfate) .Marland Kitchen.. 1 by mouth once daily 2)  Simvastatin 20 Mg Tabs (Simvastatin) .... One tablet by mouth nightly for cholesterol 3)  Haloperidol 5 Mg Tabs (Haloperidol) .Marland Kitchen.. 1 by mouth at bedtime 4)  Benztropine Mesylate 1 Mg Tabs (Benztropine mesylate) .Marland Kitchen.. 1 by mouth two times a day(per [facility]) 5)  Ferrous Sulfate 325 (65 Fe) Mg Tbec (Ferrous sulfate) .... Take one tablet by mouth three times a day 6)  Aspir-low 81 Mg Tbec (Aspirin) .... Take 1 tablet by mouth once a day 7)  Azasite 1 % Soln (Azithromycin) .... One drop in each eye every day  Other Orders: Flu Vaccine 55yrs + (42353) Admin 1st Vaccine (61443)  Lipid Assessment/Plan:      Based on NCEP/ATP III, the patient's risk factor category is "history of coronary disease, peripheral vascular disease, cerebrovascular disease, or aortic  aneurysm along with either diabetes, current smoker, or LDL > 130 plus HDL < 40 plus triglycerides > 200".  The patient's lipid goals are as follows: Total cholesterol goal is 200; LDL cholesterol goal is 160; HDL cholesterol goal is 40; Triglyceride goal is 150.     Patient Instructions: 1)  See if you can get last office from Dr. Johnsie Kindred sent to this office. 2)  You have received the flu vaccine today. 3)  Continue current medications. Let your pharmacy know when you need  refills 4)  Follow up in  6 months with n.martin,fnp for sooner if necessary 5)  Come fasting for labs.   Orders Added: 1)  Est. Patient Level III [[address]109] 2)  Flu Vaccine 49yrs + [90658] 3)  Admin 1st Vaccine [90471]   Immunizations Administered:  Influenza Vaccine # 1:    Vaccine Type: Fluvax 3+    Site: left deltoid    Mfr: GlaxoSmithKline    Dose: 0.5 ml    Route: IM    Given by: Armenia Shannon    Exp. Date: 11/05/2010    Lot #: [id number]    VIS given: 11/30/09 version given March 15, 2010.  Flu Vaccine Consent Questions:    Do you have a history of severe allergic reactions to this vaccine? no    Any prior history of allergic reactions to egg and/or gelatin? no    Do you have a sensitivity to the preservative Thimersol? no    Do you have a past history of Guillan-Barre Syndrome? no    Do you currently have an acute febrile illness? no    Have you ever had a severe reaction to latex? no    Vaccine information given and explained to patient? yes   Immunizations Administered:  Influenza Vaccine # 1:    Vaccine Type: Fluvax 3+    Site: left deltoid    Mfr: GlaxoSmithKline    Dose: 0.5 ml    Route: IM    Given by: Armenia Shannon    Exp. Date: 11/05/2010    Lot #: [id number]    VIS given: 11/30/09 version given March 15, 2010.  Prevention & Chronic Care Immunizations   Influenza vaccine: Fluvax 3+  (03/15/2010)    Tetanus booster: 12/07/2003: Historical    Pneumococcal vaccine: Historical   (12/08/2002)    H. zoster vaccine: Not documented  Colorectal Screening   Hemoccult: Not documented    Colonoscopy: Normal  (03/13/2005)  Other Screening   PSA: 0.69  (05/24/2009)   Smoking status: current  (03/15/2010)  Lipids   Total Cholesterol: 146  (11/12/2009)   LDL: 61  (11/12/2009)   LDL Direct: Not documented   HDL: 74  (11/12/2009)   Triglycerides: 57  (11/12/2009)    SGOT (AST): 27  (11/12/2009)   SGPT (ALT): 13  (11/12/2009)   Alkaline phosphatase: 55  (11/12/2009)   Total bilirubin: 0.3  (11/12/2009)  Self-Management Support :    Lipid self-management support: Not documented    Nursing Instructions: Give Flu vaccine today

## 2010-06-07 NOTE — Assessment & Plan Note (Signed)
Summary: URGENT FU PER [place]///[place]   Vital Signs:  Patient Profile:   65 Years Old Male Height:     69.5 inches Weight:      124 pounds Temp:     98.6 degrees F oral Pulse rate:   84 / minute Pulse rhythm:   regular Resp:     20 per minute BP sitting:   120 / 60  (right arm) Cuff size:   regular  Vitals Entered By: Mikey College CMA (November 12, 2007 10:13 AM)                 Serial Vital Signs/Assessments:  Comments: 11:07 AM Lying down pulse 60, BP 152/74 Sitting pulse 60,BP 154/78 Standing 70,BP 130/72 By: Mikey College CMA    PCP:  Kayode Petion   History of Present Illness: her with sister,caregiver. Pt is his own guardian. Pt denies SOB,dizziness, chest pain. Denies syncope. Sister says he walks 3 city blocks before he has to sit down and rest. denies eating clay,starch,ice,lead chips or anything unusual. Denies drinking moonshine or homemade alcohol. Denies any melena or BRBPR.    Current Allergies: ! 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      Physical Exam  General:     Well-developed,well-nourished,in no acute distress; alert,appropriate and cooperative throughout examination Lungs:     Normal respiratory effort, chest expands symmetrically. Lungs are clear to auscultation, no crackles or wheezes. Heart:     Normal rate and regular rhythm. S1 and S2 normal without gallop, murmur, click, rub or other  extra sounds. Abdomen:     Bowel sounds positive,abdomen soft and non-tender without masses, organomegaly or hernias noted. Rectal:     No external abnormalities noted. Normal sphincter tone. No rectal masses or tenderness. Guiac neg/cnrt posit.    Impression & Recommendations:  Problem # 1:  UNSPECIFIED ANEMIA (ICD-285.9) Needs outpatient transfusion of 2 PRBCs Orders: Gastroenterology Referral (GI) T-Reticulocyte Count, Manual (16109) T-Vitamin B12 (60454-09811) T-Lead Level (91478-29562) T-CBC w/Diff [phone]) T-Iron [phone]) T-Iron Binding Capacity (TIBC) ([address]) T-Ferritin (53664-40347) T- * Misc. Laboratory test [phone]) Misc. Referral (Misc. Ref)   Problem # 2:  WEIGHT LOSS, ABNORMAL (ICD-783.21) Has stabilized but still concerning issue(though ETOH may play partial role) Orders: Gastroenterology Referral (GI)   Complete Medication List: 1)  Plavix 75 Mg Tabs (Clopidogrel bisulfate) .Marland Kitchen.. 1 by mouth once daily 2)  Aspirin 325 Mg Tabs (Aspirin) .Marland Kitchen.. 1 by mouth once daily 3)  Lipitor 20 Mg Tabs (Atorvastatin calcium) .Marland Kitchen.. 1 by mouth once daily 4)  Haloperidol 5 Mg Tabs (Haloperidol) .Marland Kitchen.. 1 by mouth once daily(per [facility]) 5)  Benztropine Mesylate 1 Mg Tabs (Benztropine mesylate) .Marland Kitchen.. 1 by mouth two times a day(per [facility])  Other  Orders: T-Folic Acid; RBC ([id number])    ] Laboratory Results   Blood Tests   Date/Time Received: November 12, 2007 10:30 AM Date/Time Reported: November 12, 2007 10:30 AM    CBC   Hct:  27 %   (Normal Range: 36.0-46.0)

## 2010-06-07 NOTE — Progress Notes (Signed)
Summary: Needs transfusion 2 units PRBCs  Phone Note Outgoing Call   Summary of Call: MD called short-stay.Marland KitchenHe needs transfusion and there first available for [facility] or [facility] is Mon.,03/09/08. He is set up at [facility] short-stay for T&C followed by transfusion of 2 units PRBCs.Orders faxed to [phone] attn Laverne. Pt and his sister(guardian) only have one # and no answering machine. Message left at [facility] office(pt has appt there tomorrow at 10:45am) and at  receptionist will continue to try and reach pt's sister... MD d/w Marchelle Folks at [facility] Initial call taken by: Beverley Fiedler MD,  March 05, 2008 10:56 AM  Follow-up for Phone Call        MS. ALSOTN IS AWARE OF THIS APPT FOR HER BROTHER. Follow-up by: Leodis Rains,  March 06, 2008 12:46 PM      Appended Document: Needs transfusion 2 units PRBCs

## 2010-06-07 NOTE — Letter (Signed)
Summary: REFERRAL/[facility]  REFERRAL/[facility]   Imported By: Arta Bruce 05/31/2009 12:34:42  _____________________________________________________________________  External Attachment:    Type:   Image     Comment:   External Document

## 2010-06-07 NOTE — Progress Notes (Signed)
  Phone Note Other Incoming   Summary of Call: received request for refill on Plavix he was supposed to return one week after last office visit in July for labs I do not see that they were ever done will fill Plavix for one month needs those labs done this week Initial call taken by: Brynda Rim,  January 25, 2009 2:14 PM  Follow-up for Phone Call        spoke with pt's mother and she said she can have pt to come in on Wed...........Marland KitchenArmenia Shannon  January 25, 2009 2:56 PM

## 2010-06-07 NOTE — Assessment & Plan Note (Signed)
Summary: f/u???///rjp   Vital Signs:  Patient profile:   65 year old male Weight:      130 pounds Temp:     98.5 degrees F Pulse rate:   68 / minute Pulse rhythm:   regular Resp:     16 per minute BP sitting:   136 / 60  (left arm) Cuff size:   regular  Vitals Entered By: Vesta Mixer CMA (August 26, 2008 10:21 AM) CC: "just for a check up" Is Patient Diabetic? No Pain Assessment Patient in pain? no       Does patient need assistance? Ambulation Normal   History of Present Illness: Here with sister. Weight up 4 lbs from 10/09. 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 Sister reports that they were told that no f/u was necessary. No dizziness or weakness. No blood in stools.Says stools dark(black)...he does take iron tablet daily. No hemetemesis. Appetite and energy level good. Eating most meals at home. Still drinks "one small can of beer per day".  Current Medications (verified): 1)  Plavix 75 Mg Tabs (Clopidogrel Bisulfate) .Marland Kitchen.. 1 By Mouth Once Daily 2)  Aspirin 325 Mg Tabs (Aspirin) .Marland Kitchen.. 1 By Mouth Once Daily 3)  Lipitor 20 Mg Tabs (Atorvastatin Calcium) .Marland Kitchen.. 1 By Mouth Once Daily 4)  Haloperidol 5 Mg  Tabs (Haloperidol) .Marland Kitchen.. 1 By Mouth Once Daily(Per [facility]) 5)  Benztropine Mesylate 1 Mg  Tabs (Benztropine Mesylate) .Marland Kitchen.. 1 By Mouth Two Times A Day(Per [facility]) 6)  Ferrous Sulfate 325 (65 Fe) Mg Tbec (Ferrous Sulfate) .... Take One Tablet By Mouth Three Times A Day  Allergies (verified): 1)  ! Cogentin (Benztropine Mesylate) 2)  ! 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    03/16/2008 EGD Dr.Hayes.Marland KitchenMarland KitchenAVMs x2(duodenum)  Social History:    Reviewed history from 12/19/2006 and no changes required:       Single       Alcohol use-yes       Drug use-yes       Lives w/sister Kurtis Bushman POA/guardian  Physical Exam  General:  Well-developed,well-nourished,in no acute distress; alert,appropriate and cooperative throughout examination. NAD. Little spontaneous speech. Lungs:  Normal respiratory effort, chest expands symmetrically. Lungs are clear to auscultation, no crackles or wheezes. Heart:  Normal rate and regular rhythm. S1 and S2 normal without gallop, murmur, click, rub or other extra sounds. Abdomen:  Bowel sounds positive,abdomen soft and non-tender without masses, organomegaly or hernias noted.   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 Will hopefully have resolved s/p laser treatment of AVMs at 11/09 EGD.  His updated medication list for this problem includes:    Ferrous Sulfate 325 (65 Fe) Mg Tbec (Ferrous sulfate) .Marland Kitchen... Take one tablet by mouth three times a day  Orders: T-CBC w/Diff ([id number])  Problem # 2:  WEIGHT LOSS, ABNORMAL (ICD-783.21) Weight  up 3 lbs. Appears to have stabilized(and suspect improved intake if patient has indeed cut back to just 1 beer per day).  Problem # 3:  DYSLIPIDEMIA (ICD-272.4)  His updated medication list for this problem includes:    Lipitor 20 Mg Tabs (Atorvastatin calcium) .Marland Kitchen... 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 His  updated medication list for this problem includes:    Plavix 75 Mg Tabs (Clopidogrel bisulfate) .Marland Kitchen... 1 by mouth once daily    Aspirin 325 Mg Tabs (Aspirin) .Marland Kitchen... 1 by mouth once daily  Complete Medication List: 1)  Plavix 75 Mg Tabs (Clopidogrel bisulfate) .Marland Kitchen.. 1 by mouth once daily 2)  Aspirin 325 Mg Tabs (Aspirin) .Marland Kitchen.. 1 by mouth once daily 3)  Lipitor 20 Mg Tabs (Atorvastatin calcium) .Marland Kitchen.. 1 by mouth once daily 4)  Haloperidol 5 Mg Tabs (Haloperidol) .Marland Kitchen.. 1 by mouth once daily(per [facility]) 5)  Benztropine Mesylate 1 Mg Tabs (Benztropine mesylate) .Marland Kitchen.. 1 by mouth two times a day(per [facility]) 6)  Ferrous Sulfate 325 (65 Fe) Mg Tbec (Ferrous sulfate) .... Take one tablet by mouth three times a day  Patient Instructions: 1)  If labs look good...next office visit in 6 months Prescriptions: FERROUS SULFATE 325 (65 FE) MG TBEC (FERROUS SULFATE) Take one tablet by mouth three times a day  #90 x 3   Entered and Authorized by:   Beverley Fiedler MD   Signed by:   Beverley Fiedler MD on 08/26/2008   Method used:   [facility]        [facility] [address] [phone][facility] (retail)       [address]       [place], [place]  [address]       Ph: [phone] or [phone]       Fax: [phone]   RxID:   [id number] LIPITOR 20 MG TABS (ATORVASTATIN CALCIUM) 1 by mouth once daily  #30 Tablet x 5   Entered and Authorized by:   Beverley Fiedler MD   Signed by:   Beverley Fiedler MD on 08/26/2008   Method used:   [facility] to        [facility] [address] [phone]* (retail)       [address]       [place], [place]  [address]       Ph: [phone] or [phone]       Fax: [phone]   RxID:   [id number] PLAVIX 75 MG TABS (CLOPIDOGREL BISULFATE) 1 by mouth once daily  #30 x 5   Entered and Authorized by:   Beverley Fiedler MD   Signed by:   Beverley Fiedler MD on 08/26/2008   Method used:   Electronically to        [facility] [address] [phone]* (retail)       [address]       [place], [place]  [address]       Ph: [phone] or [phone]       Fax: [phone]   RxID:   [id number]

## 2010-06-07 NOTE — Assessment & Plan Note (Signed)
Summary: 6 MONTHS CHECK UP / NS   Vital Signs:  Patient Profile:   65 Years Old Male Height:     69.5 inches Weight:      124 pounds Temp:     98.5 degrees F oral Pulse rate:   76 / minute Pulse rhythm:   regular Resp:     20 per minute BP sitting:   110 / 60  (right arm) Cuff size:   regular  Pt. in pain?   no  Vitals Entered By: Mikey College CMA (November 11, 2007 9:49 AM)              Is Patient Diabetic? No Comments [facility]     PCP:  Cathleen Yagi  Chief Complaint:  pt here for mos checkup.Marland Kitchen  History of Present Illness: Here with sister,caregiver. Appetite good and has maintained weight since 12/08. Still leaves in AM and spends day out with friends but says he doesn't drink. He is coming in before dark these days which is an improvement per sister. Pt and sister are getting along well No recent behavior problems.No ill symptoms.    Current Allergies: ! 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      Physical Exam  General:     Well-developed,well-nourished,in no acute distress; alert,appropriate and cooperative throughout examination Lungs:     Normal respiratory effort, chest expands symmetrically. Lungs are clear to auscultation, no crackles or wheezes. Heart:     Normal rate and regular rhythm. S1 and S2 normal without gallop, murmur, click, rub or other extra sounds. Abdomen:     Bowel  sounds positive,abdomen soft and non-tender without masses, organomegaly or hernias noted. Msk:     No CCE    Impression & Recommendations:  Problem # 1:  WEIGHT LOSS, ABNORMAL (ICD-783.21) Weight loss stabilized and he has maintained his weight since last visit. Pt denies ETOH use but reliability as historian is a concern. sister reports that he is not drinking at home and is staying home more. 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 Sister reports that they have also discussed issue at home and patient remains against surgery oprtion. Pt verbalizes decision against seeing surgeon here in office. His updated medication list for this problem includes:    Plavix 75 Mg Tabs (Clopidogrel bisulfate) .Marland Kitchen... 1 by mouth once daily    Aspirin 325 Mg Tabs (Aspirin) .Marland Kitchen... 1 by mouth once daily   Problem # 3:  DYSLIPIDEMIA (ICD-272.4)  His updated medication list for this problem includes:    Lipitor 20 Mg Tabs (Atorvastatin  calcium) .Marland Kitchen... 1 by mouth once daily  Orders: T-General Health Panel (CBCD, CMP, TSH) ([address]) T-Lipid Profile ([address]45-[address])   Complete Medication List: 1)  Plavix 75 Mg Tabs (Clopidogrel bisulfate) .Marland Kitchen.. 1 by mouth once daily 2)  Aspirin 325 Mg Tabs (Aspirin) .Marland Kitchen.. 1 by mouth once daily 3)  Lipitor 20 Mg Tabs (Atorvastatin calcium) .Marland Kitchen.. 1 by mouth once daily 4)  Haloperidol 5 Mg Tabs (Haloperidol) .Marland Kitchen.. 1 by mouth once daily(per [facility]) 5)  Benztropine Mesylate 1 Mg Tabs (Benztropine mesylate) .Marland Kitchen.. 1 by mouth two times a day(per [facility])   Patient Instructions: 1)  Please schedule a follow-up appointment in 6 months.   Prescriptions: PLAVIX 75 MG TABS (CLOPIDOGREL BISULFATE) 1 by mouth once daily  #30 x 5   Entered and Authorized by:   Beverley Fiedler MD   Signed by:   Beverley Fiedler MD on 11/11/2007   Method used:    Electronically sent to ...       [facility] [address] [phone]*       [address]       [place], [place]  [address]       Ph: [phone] or [phone]       Fax: [phone]   RxID:   [id number] LIPITOR 20 MG TABS (ATORVASTATIN CALCIUM) 1 by mouth once daily  #30 Tablet x 5   Entered and Authorized by:   Beverley Fiedler MD   Signed by:   Beverley Fiedler MD on 11/11/2007   Method used:   Electronically sent to ...       [facility] [address] [phone]*       [address]       [place], [place]  [address]       Ph: [phone] or [phone]       Fax: [phone]   RxID:   [id number] ASPIRIN 325 MG TABS (ASPIRIN) 1 by mouth once daily  #30 x 5   Entered and Authorized by:   Beverley Fiedler MD   Signed by:   Beverley Fiedler MD on 11/11/2007   Method used:   Electronically sent to ...       [facility] [address] [phone]*       [address]       [place], [place]  [address]       Ph: [phone] or [phone]       Fax: [phone]   RxID:   [id number]  ]

## 2010-06-07 NOTE — Assessment & Plan Note (Signed)
Summary: Anemia/Carotid artery Stenosis   Vital Signs:  Patient profile:   65 year old male Weight:      122.4 pounds BMI:     17.88 BSA:     1.69 Temp:     98.0 degrees F oral Pulse rate:   73 / minute Pulse rhythm:   regular Resp:     16 per minute BP sitting:   116 / 71  (left arm) Cuff size:   regular  Vitals Entered By: Levon Hedger (May 24, 2009 9:06 AM) CC: follow-up visit, Lipid Management Is Patient Diabetic? No Pain Assessment Patient in pain? no       Does patient need assistance? Functional Status Self care Ambulation Normal   Primary Care Provider:  [facility]  CC:  follow-up visit and Lipid Management.  History of Present Illness:  Pt into the office for 6 month f/u. Presents today with his sister with whom he lives  Known carotid stenosis - pt has refused intervention in the past.  Pt is on plavix.  Anemia - s/p blood transfusions in the past.  iron supplement by mouth three times a day. Admits that it makes his stools are hard and dark.  No fiber supplements  No acute problems today.  Lipid Management History:      Positive NCEP/ATP III risk factors include male age 59 years old or older, current tobacco user, and peripheral vascular disease.  Negative NCEP/ATP III risk factors include HDL cholesterol greater than 60, no family history for ischemic heart disease, non-hypertensive, no ASHD (atherosclerotic heart disease), no prior stroke/TIA, and no history of aortic aneurysm.        Comments include: no current meds.    Habits & Providers  Alcohol-Tobacco-Diet     Alcohol type: beer     Tobacco Status: current     Tobacco Counseling: to quit use of tobacco products     Cigarette Packs/Day: 1/2     Year Started: 1962     Passive Smoke Exposure: yes  Exercise-Depression-Behavior     Does Patient Exercise: yes     Type of exercise: walk     Times/week: 7     Drug Use: no     Seat Belt Use: 100     Sun Exposure:  occasionally  Medications Prior to Update: 1)  Plavix 75 Mg Tabs (Clopidogrel Bisulfate) .Marland Kitchen.. 1 By Mouth Once Daily 2)  Aspirin 325 Mg Tabs (Aspirin) .Marland Kitchen.. 1 By Mouth Once Daily 3)  Lipitor 20 Mg Tabs (Atorvastatin Calcium) .Marland Kitchen.. 1 By Mouth Once Daily 4)  Haloperidol 5 Mg  Tabs (Haloperidol) .Marland Kitchen.. 1 By Mouth Once Daily(Per [facility]) 5)  Benztropine Mesylate 1 Mg  Tabs (Benztropine Mesylate) .Marland Kitchen.. 1 By Mouth Two Times A Day(Per [facility]) 6)  Ferrous Sulfate 325 (65 Fe) Mg Tbec (Ferrous Sulfate) .... Take One Tablet By Mouth Three Times A Day 7)  Aspir-Low 81 Mg Tbec (Aspirin) .... Take 1 Tablet By Mouth Once A Day  Allergies (verified): 1)  ! Cogentin (Benztropine Mesylate) 2)  ! Thorazine  Review of Systems General:  Denies fever. CV:  Denies chest pain or discomfort. Resp:  Denies cough. GI:  Denies abdominal pain, nausea, and vomiting. MS:  Denies joint pain.  Physical Exam  General:  alert.  thin Head:  normocephalic.   Neck:  carotid bruit Lungs:  increased AP diameter Decreased bases Heart:  normal rate and regular rhythm.     Impression & Recommendations:  Problem # 1:  DYSLIPIDEMIA (  ICD-272.4) stable on last visit His updated medication list for this problem includes:    Simvastatin 20 Mg Tabs (Simvastatin) ..... One tablet by mouth nightly for cholesterol  Problem # 2:  TOBACCO ABUSE (ICD-305.1) advised cessation as this worsens his carotid stenosis and lungs  Problem # 3:  UNSPECIFIED ANEMIA (ICD-285.9)  will check anemia profile today advised pt to add fiber in the diet His updated medication list for this problem includes:    Ferrous Sulfate 325 (65 Fe) Mg Tbec (Ferrous sulfate) .Marland Kitchen... Take one tablet by mouth three times a day  Orders: T- * Misc. 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 no intervention. plavix  His updated medication list for this problem includes:    Plavix 75 Mg Tabs (Clopidogrel bisulfate) .Marland Kitchen...  1 by mouth once daily    Aspirin 325 Mg Tabs (Aspirin) .Marland Kitchen... 1 by mouth once daily    Aspir-low 81 Mg Tbec (Aspirin) .Marland Kitchen... Take 1 tablet by mouth once a day  Problem # 5:  BENIGN PROSTATIC HYPERTROPHY, HX OF (ICD-V13.8)  [facility] with [facility] 6 months. 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 advised pt to keep f/u  Problem # 7:  NEED PROPHYLACTIC VACCINATION&INOCULATION FLU (ICD-V04.81) given in office today  Complete Medication List: 1)  Plavix 75 Mg Tabs (Clopidogrel bisulfate) .Marland Kitchen.. 1 by mouth once daily 2)  Aspirin 325 Mg Tabs (Aspirin) .Marland Kitchen.. 1 by mouth once daily 3)  Simvastatin 20 Mg Tabs (Simvastatin) .... One tablet by mouth nightly for cholesterol 4)  Haloperidol 5 Mg Tabs (Haloperidol) .Marland Kitchen.. 1 by mouth once daily(per [facility]) 5)  Benztropine Mesylate 1 Mg Tabs (Benztropine mesylate) .Marland Kitchen.. 1 by mouth two times a day(per [facility]) 6)  Ferrous Sulfate 325 (65 Fe) Mg Tbec (Ferrous sulfate) .... Take one tablet by mouth three times a day 7)  Aspir-low 81 Mg Tbec (Aspirin) .... Take 1 tablet by mouth once a day  Other Orders: Flu Vaccine 6yrs + (64332) Admin 1st Vaccine (95188) Admin 1st Vaccine [facility]) [phone])  Lipid Assessment/Plan:      Based on NCEP/ATP III, the patient's risk factor category is "history of coronary disease, peripheral vascular disease, cerebrovascular disease, or aortic aneurysm along with either diabetes, current smoker, or LDL > 130 plus HDL < 40 plus triglycerides > 200".  The patient's lipid goals are as follows: Total cholesterol goal is 200; LDL cholesterol goal is 160; HDL cholesterol goal is 40; Triglyceride goal is 150.     Patient Instructions: 1)  You have recieved your flu vaccine today. 2)  Add fiber to your diet to keep your stools soft.  Oatmeal, raisins and fiber one ceral are great. 3)  Schedule a follow up appointment in July  2011. 4)  Do not eat before this appointment will need lipids/lft's Prescriptions: SIMVASTATIN 20 MG TABS (SIMVASTATIN) One tablet by mouth nightly for cholesterol  #30 x 5   Entered and Authorized by:   Lehman Prom FNP   Signed by:   Lehman Prom FNP on 05/24/2009   Method used:   [facility] to        [facility]  [address] [phone]* (retail)       [address]       [place], [place]  [address]       Ph:  [phone] or [phone]       Fax: [phone]   RxID:   [id number]    Influenza Vaccine    Vaccine Type: Fluvax 3+    Site: left deltoid    Mfr: Sanofi Pasteur    Dose: 0.5 ml    Route: IM    Given by: Levon Hedger    Exp. Date: 11/04/2009    Lot #: [id number]    VIS given: 11/29/06 version given May 24, 2009.  Flu Vaccine Consent Questions    Do you have a history of severe allergic reactions to this vaccine? no    Any prior history of allergic reactions to egg and/or gelatin? no    Do you have a sensitivity to the preservative Thimersol? no    Do you have a past history of Guillan-Barre Syndrome? no    Do you currently have an acute febrile illness? no    Have you ever had a severe reaction to latex? no    Vaccine information given and explained to patient? yes

## 2010-06-07 NOTE — Letter (Signed)
Summary: [facility]   Imported By: Arta Bruce 03/22/2010 15:20:51  _____________________________________________________________________  External Attachment:    Type:   Image     Comment:   External Document

## 2010-06-07 NOTE — Miscellaneous (Signed)
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

## 2010-06-07 NOTE — Miscellaneous (Signed)
  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

## 2010-06-07 NOTE — Progress Notes (Signed)
Summary: WEIGHT REPORT  Phone Note Call from Patient Call back at Home Phone [phone]   Summary of Call: WEAVER PT. MR [place] SISTER, DOROTHY CAME BY WITH HIS WEIGHT FOR 4 WEEKS, FROM 7-14, IT WAS 118, 7-18, IT WAS 120,  7-25, IT WAS 119, AND 08/73M, IT IS 120 Initial call taken by: Leodis Rains,  December 06, 2009 10:39 AM  Follow-up for Phone Call        forward to provider Follow-up by: Arm[place] Shannon,  December 06, 2009 11:11 AM  Additional Follow-up for Phone Call Additional follow up Details #1::        Looks like this is Margeart Allender's patient. Has been following with her for last several months. Will forward to N. Daphine Deutscher, NP Tereso Newcomer PA-C  December 06, 2009 2:39 PM     Additional Follow-up for Phone Call Additional follow up Details #2::    notify pt/sister thanks for weight values holding steady and actually up two pounds. continue to eat snacks during the day and a good breakfast and dinner as discussed during office visit. Follow-up by: Lehman Prom FNP,  December 07, 2009 8:11 AM  Additional Follow-up for Phone Call Additional follow up Details #3:: Details for Additional Follow-up Action Taken: Levon Hedger  December 07, 2009 3:27 PM Left message on machine for pt to return call to the office.  MS ALSTON STOPPED BY THIS MORNING AND SHE IS AWARE OF THE ABOVE MESSAGE FOR MR Morel. Additional Follow-up by: Leodis Rains,  December 08, 2009 9:29 AM

## 2010-06-07 NOTE — Progress Notes (Signed)
Summary: Office Visit - Podiatry  Office Visit - Podiatry   Imported By: Paula Libra 02/01/2009 16:44:07  _____________________________________________________________________  External Attachment:    Type:   Image     Comment:   External Document

## 2010-06-07 NOTE — Assessment & Plan Note (Signed)
Summary: 6 month f/u visit   Vital Signs:  Patient profile:   65 year old male Weight:      118.5 pounds BMI:     17.31 BSA:     1.66 Temp:     98.0 degrees F oral Pulse rate:   70 / minute Pulse rhythm:   regular Resp:     16 per minute BP sitting:   110 / 62  (left arm) Cuff size:   regular  Vitals Entered By: Levon Hedger (November 12, 2009 9:03 AM) CC: follow-up visit, Lipid Management Is Patient Diabetic? No Pain Assessment Patient in pain? no       Does patient need assistance? Functional Status Self care Ambulation Normal   Primary Care Provider:  [facility]  CC:  follow-up visit and Lipid Management.  History of Present Illness:  Pt into the office for 6 month f/u Presents today with his sister  Weight loss - Sister has concerns that pt is losing weight.  Down 4 pounds since the last. Pt walks a lot during the day. After breakfast he leaves his house and walks up to the corner store.  He is there all day, may eat a snack but no lunchtime meal.  He then goes home for dinner. Pt lives with his sister. Later during the interview pt admits that he drinks beer - daily when he goes to the store  Mental health - pt f/u there every 6 months. presents today with medication from that office  Lipid Management History:      Positive NCEP/ATP III risk factors include male age 10 years old or older, current tobacco user, and peripheral vascular disease.  Negative NCEP/ATP III risk factors include HDL cholesterol greater than 60, no family history for ischemic heart disease, non-hypertensive, no ASHD (atherosclerotic heart disease), no prior stroke/TIA, and no history of aortic aneurysm.        The patient does not know about adjunctive measures for cholesterol lowering.  Adjunctive measures started by the patient include ASA.  He expresses no side effects from his lipid-lowering medication.  The patient denies any symptoms to suggest myopathy or liver disease.     Habits &  Providers  Alcohol-Tobacco-Diet     Alcohol drinks/day: 1     Alcohol Counseling: to decrease amount and/or frequency of alcohol intake     Alcohol type: beer     Tobacco Status: current     Tobacco Counseling: to quit use of tobacco products     Cigarette Packs/Day: 1/2     Year Started: 1962     Passive Smoke Exposure: yes  Exercise-Depression-Behavior     Does Patient Exercise: yes     Type of exercise: walk     Times/week: 7     Drug Use: no     Seat Belt Use: 100     Sun Exposure: occasionally  Medications Prior to Update: 1)  Plavix 75 Mg Tabs (Clopidogrel Bisulfate) .Marland Kitchen.. 1 By Mouth Once Daily 2)  Aspirin 325 Mg Tabs (Aspirin) .Marland Kitchen.. 1 By Mouth Once Daily 3)  Simvastatin 20 Mg Tabs (Simvastatin) .... One Tablet By Mouth Nightly For Cholesterol 4)  Haloperidol 5 Mg  Tabs (Haloperidol) .Marland Kitchen.. 1 By Mouth At Bedtime 5)  Benztropine Mesylate 1 Mg  Tabs (Benztropine Mesylate) .Marland Kitchen.. 1 By Mouth Two Times A Day(Per [facility]) 6)  Ferrous Sulfate 325 (65 Fe) Mg Tbec (Ferrous Sulfate) .... Take One Tablet By Mouth Three Times A Day 7)  Aspir-Low  81 Mg Tbec (Aspirin) .... Take 1 Tablet By Mouth Once A Day  Allergies (verified): 1)  ! Cogentin (Benztropine Mesylate) 2)  ! Thorazine  Review of Systems General:  Denies fever. CV:  Denies chest pain or discomfort. Resp:  Denies cough. GI:  Denies abdominal pain, nausea, and vomiting. MS:  foot pain.  Physical Exam  General:  alert.  thin Head:  normocephalic.   Lungs:  normal breath sounds.   Heart:  normal rate and regular rhythm.   Abdomen:  normal bowel sounds.   Msk:  bil feet with all toes amputated - callouses bilaterally Neurologic:  alert & oriented X3.     Impression & Recommendations:  Problem # 1:  ATHEROSCLEROSIS, CEREBRAL (ICD-437.0)  His updated medication list for this problem includes:    Plavix 75 Mg Tabs (Clopidogrel bisulfate) .Marland Kitchen... 1 by mouth once daily    Aspirin 325 Mg Tabs (Aspirin) .Marland Kitchen... 1 by mouth once  daily    Aspir-low 81 Mg Tbec (Aspirin) .Marland Kitchen... Take 1 tablet by mouth once a day  Problem # 2:  DYSLIPIDEMIA (ICD-272.4) will check labs today will refill simvastatin His updated medication list for this problem includes:    Simvastatin 20 Mg Tabs (Simvastatin) ..... One tablet by mouth nightly for cholesterol  Orders: T-Lipid Profile ([id number]) T-Hepatic Function [phone])  Problem # 3:  WEIGHT LOSS, ABNORMAL (ICD-783.21) advised pt to monitor ETOH use try to eat healthy snacks during the day monitor weight every Monday for the next 4 weeks  Problem # 4:  TOBACCO ABUSE (ICD-305.1) advised cessation  Problem # 5:  CALLUS, RIGHT FOOT (ICD-700) sister will refer to podiatry  Problem # 6:  ALCOHOL ABUSE (ICD-305.00) advised cessation  Complete Medication List: 1)  Plavix 75 Mg Tabs (Clopidogrel bisulfate) .Marland Kitchen.. 1 by mouth once daily 2)  Aspirin 325 Mg Tabs (Aspirin) .Marland Kitchen.. 1 by mouth once daily 3)  Simvastatin 20 Mg Tabs (Simvastatin) .... One tablet by mouth nightly for cholesterol 4)  Haloperidol 5 Mg Tabs (Haloperidol) .Marland Kitchen.. 1 by mouth at bedtime 5)  Benztropine Mesylate 1 Mg Tabs (Benztropine mesylate) .Marland Kitchen.. 1 by mouth two times a day(per [facility]) 6)  Ferrous Sulfate 325 (65 Fe) Mg Tbec (Ferrous sulfate) .... Take one tablet by mouth three times a day 7)  Aspir-low 81 Mg Tbec (Aspirin) .... Take 1 tablet by mouth once a day 8)  Azasite 1 % Soln (Azithromycin) .... One drop in each eye every day  Other Orders: T- * Misc. Laboratory test [phone])  Lipid Assessment/Plan:      Based on NCEP/ATP III, the patient's risk factor category is "history of coronary disease, peripheral vascular disease, cerebrovascular disease, or aortic aneurysm along with either diabetes, current smoker, or LDL > 130 plus HDL < 40 plus triglycerides > 200".  The patient's lipid goals are as follows: Total cholesterol goal is 200; LDL cholesterol goal is 160; HDL cholesterol goal is 40; Triglyceride goal  is 150.    Patient Instructions: 1)  Weight loss - monitor weight for the next 4 weeks on Monday.  write down and either call or bring to this office 2)  Your labs will be checked today for cholesterol, liver and blood count.  you will be notified of the results 3)  Remember to try to decrease your alcohol intake.  The alcohol makes you eat less and take in less calories. 4)  Follow up in 4 months.  You will need your flu vaccine at that time. Prescriptions:  SIMVASTATIN 20 MG TABS (SIMVASTATIN) One tablet by mouth nightly for cholesterol  #30 x 5   Entered and Authorized by:   Lehman Prom FNP   Signed by:   Lehman Prom FNP on 11/12/2009   Method used:   [facility]  [address] [phone]* (retail)       [address]       [place], [place]  [id number]       Ph: [phone] or [phone]       Fax: [phone]   RxID:   [id number]

## 2010-06-07 NOTE — Assessment & Plan Note (Signed)
Summary: per Dr Shacara Cozine/ f/u 2 mos ?????? //gk   Vital Signs:  Patient Profile:   65 Years Old Male Height:     69.5 inches Weight:      124 pounds BMI:     18.11 BSA:     1.70 Temp:     97.4 degrees F oral Pulse (ortho):   78 / minute Pulse rhythm:   regular Resp:     18 per minute BP sitting:   120 / 60  (left arm) Cuff size:   regular  Pt. in pain?   no  Vitals Entered By: Gaylyn Cheers RN (April 23, 2007 9:15 AM)              Is Patient Diabetic? No  Does patient need assistance? Ambulation Normal     PCP:  Lonia Roane  Chief Complaint:  F/U per Dr. Barbaraann Barthel.  History of Present Illness: Here with sister/caregiver. Now that weather is colder,pt is staying at home and has subsequently decreased his alcohol use(not getting it out in community) and is eating regular meals. He has gained 3 lbs since his 02/18/07 visit. Has good appetite and is eating well at meals and then has snacks. No abdominal complaints or acute care issues per patient and his sister.  Current Allergies (reviewed today): ! 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    Risk Factors:    Review of Systems      See HPI   Physical Exam  General:     Well-developed,well-nourished,in no acute distress; alert,appropriate and cooperative throughout examination Lungs:     Normal respiratory effort, chest expands symmetrically. Lungs are clear  to auscultation, no crackles or wheezes. Heart:     Normal rate and regular rhythm. S1 and S2 normal without gallop, murmur, click, rub or other extra sounds.    Impression & Recommendations:  Problem # 1:  WEIGHT LOSS, ABNORMAL (ICD-783.21) Pt is now gaining as is in colder months and doesn't miss meals because hangs out in community and drinks beer. Doing well per his sister/caregiver. Got FLU shot today All labs from 02/19/07 visit were normal and are reviewed with Pt and his sister.  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 His updated medication list for this problem includes:    Plavix 75 Mg Tabs (Clopidogrel bisulfate) .Marland Kitchen... 1 by mouth once daily    Aspirin 325 Mg Tabs (Aspirin) .Marland Kitchen... 1 by mouth once daily   Complete Medication List: 1)  Plavix 75 Mg Tabs (Clopidogrel bisulfate) .Marland Kitchen.. 1 by mouth once daily 2)  Aspirin 325 Mg Tabs (Aspirin) .Marland Kitchen.. 1 by mouth once daily 3)  Lipitor 20 Mg Tabs (Atorvastatin calcium) .Marland KitchenMarland KitchenMarland Kitchen  1 by mouth once daily 4)  Haloperidol 5 Mg Tabs (Haloperidol) .Marland Kitchen.. 1 by mouth once daily(per [facility]) 5)  Benztropine Mesylate 1 Mg Tabs (Benztropine mesylate) .Marland Kitchen.. 1 by mouth two times a day(per [facility])  Other Orders: Flu Vaccine 9yrs + (13086) Admin 1st Vaccine ([id number])   Patient Instructions: 1)  Please schedule a follow-up appointment in 6 months. 2)  Pt got his FLU shot today    ]  Influenza Vaccine    Vaccine Type: Fluvax 3+    Site: left deltoid    Mfr: Sanofi Pasteur    Dose: 0.5 ml    Route: IM    Given by: Gaylyn Cheers RN    Exp. Date: 11/05/2007    Lot #: [id number]    VIS given: 11/29/06 version given April 23, 2007.  Flu Vaccine Consent Questions    Do you have a history of severe allergic reactions to this vaccine? no    Any prior history of allergic reactions to egg and/or gelatin? no    Do you have a sensitivity to the preservative Thimersol? no    Do you have a past  history of Guillan-Barre Syndrome? no    Do you currently have an acute febrile illness? no    Have you ever had a severe reaction to latex? no    Vaccine information given and explained to patient? yes

## 2010-06-07 NOTE — Assessment & Plan Note (Signed)
Summary: NASAL BLEEDING//GK   Vital Signs:  Patient profile:   65 year old male Height:      69.50 inches Weight:      122 pounds BMI:     17.82 BSA:     1.68 Temp:     97.5 degrees F oral Pulse rate:   73 / minute Pulse rhythm:   regular Resp:     16 per minute BP sitting:   118 / 70  (left arm) Cuff size:   regular  Vitals Entered By: Levon Hedger (November 19, 2008 11:22 AM) CC: had nose bleed one morning about 3 am/  needs follow-u0 Is Patient Diabetic? No Pain Assessment Patient in pain? no       Does patient need assistance? Functional Status Self care Ambulation Normal   Primary Care Provider:  [facility]  CC:  had nose bleed one morning about 3 am/  needs follow-u0.  History of Present Illness: Patient added on for epistaxis.  He is a prior patient of Dr. Barbaraann Barthel.  He has a h/o schizophrenia.  He lives with his sister who is here with him today.    The epistaxis occurred last Wed. am and lasted about 30 mins. on the right side.  There has been no recurrence.    He really presented today to have a checkup.  He has a h/o severe anemia treated with transfusion with PRBCs and referral to GI in October 2009.  He apparently had a previous colo that was normal in 2006 per the records.  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  His sister tells me he has been on iron therapy ever since.   He has not had a CBC since 08/2008.  He denies melena, hematochezia, hematemesis.  He has a h/o weight loss.  His appetite is better.  He is up 9 pounds since 2008.  He has a h/o >80% LICA stenosis by carotid doppler in 2007.  Pt. has declined vasc. surg referral in past.  No symptoms of TIAs or A.Fugax.  He has h/o schizophrenia and is followed by [facility] Hortencia Pilar at [facility].  Tolerating meds well.  Has f/u in Sept. 2010.  He has a h/o BPH and sees urology on a regular basis.  He has f/u soon.   Habits & Providers  Alcohol-Tobacco-Diet     Tobacco Status:  current  Problems Prior to Update: 1)  Arteriovenous Malformation  (ICD-747.60) 2)  Unspecified Anemia  (ICD-285.9) 3)  Weight Loss, Abnormal  (ICD-783.21) 4)  Carotid Artery Stenosis, Left  (ICD-433.10) 5)  Benign Prostatic Hypertrophy, Hx of  (ICD-V13.8) 6)  Substance Abuse, Multiple  (ICD-305.90) 7)  Schizophrenia Nos, Chronic  (ICD-295.92) 8)  Dyslipidemia  (ICD-272.4) 9)  Atherosclerosis, Cerebral  (ICD-437.0)  Medications Prior to Update: 1)  Plavix 75 Mg Tabs (Clopidogrel Bisulfate) .Marland Kitchen.. 1 By Mouth Once Daily 2)  Aspirin 325 Mg Tabs (Aspirin) .Marland Kitchen.. 1 By Mouth Once Daily 3)  Lipitor 20 Mg Tabs (Atorvastatin Calcium) .Marland Kitchen.. 1 By Mouth Once Daily 4)  Haloperidol 5 Mg  Tabs (Haloperidol) .Marland Kitchen.. 1 By Mouth Once Daily(Per [facility]) 5)  Benztropine Mesylate 1 Mg  Tabs (Benztropine Mesylate) .Marland Kitchen.. 1 By Mouth Two Times A Day(Per [facility]) 6)  Ferrous Sulfate 325 (65 Fe) Mg Tbec (Ferrous Sulfate) .... Take One Tablet By Mouth Three Times A Day  Allergies (verified): 1)  ! Cogentin (Benztropine Mesylate) 2)  ! 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         He does complain of a callus on his right foot (plantar surface).  He has a h/o frost bite and is s/p amputation of most toes on bilat. feet.   Physical Exam  General:  alert,  well-developed, and well-nourished.   Head:  normocephalic and atraumatic.   Eyes:  pupils equal, pupils round, and pupils reactive to light.   Ears:  R ear normal and L ear normal.   Nose:  no external deformity, no nasal mucosal lesions, and no active bleeding or clots.   Mouth:  pharynx pink and moist.   Neck:  supple.   Lungs:  normal respiratory effort, normal breath sounds, no crackles, and no wheezes.   Heart:  normal rate, regular rhythm, and no murmur.   Abdomen:  soft, non-tender, normal bowel sounds, and no hepatomegaly.   Msk:  normal ROM.   Neurologic:  alert & oriented X3 and cranial nerves II-XII intact.   Skin:  turgor normal.   Psych:  flat affect.   Additional Exam:  Right foot with all toes except the 5th toe amputated; large callus noted at 1st metatarsal head   Detailed Cardiovascular Exam  Neck    Carotids: cannot appreciate any bruits bilat.   Impression & Recommendations:  Problem # 1:  ARTERIOVENOUS MALFORMATION (ICD-747.60) He a h/o severe anemia requiring PRBC transfusion.  He is still on Iron.  GI is to see back on an as needed basis.  Will check CBC and ferritin level today.  If iron stores normal, could probably d/c iron. Orders: T-CBC w/Diff ([id number])  Problem # 2:  Preventive Health Care (ICD-V70.0) He would be at risk for vit. D deficiency with thin stature, smoking hx, race.  Will check Vit. D level with labs today. Orders: T-Vitamin D (25-Hydroxy) [id number])  Problem # 3:  DYSLIPIDEMIA (ICD-272.4) Recent labs in 08/2008.  Will recheck lipids and LFTs before next visit. His updated medication list for this problem includes:    Lipitor 20 Mg Tabs (Atorvastatin calcium) .Marland Kitchen... 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  He still refuses referral to vascular surgery.  Discussed reasons to have carotid stenosis treated with the patient and his sister today. His updated  medication list for this problem includes:    Plavix 75 Mg Tabs (Clopidogrel bisulfate) .Marland Kitchen... 1 by mouth once daily    Aspirin 325 Mg Tabs (Aspirin) .Marland Kitchen... 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.  Problem # 6:  BENIGN PROSTATIC HYPERTROPHY, HX OF (ICD-V13.8) Followed by urology.  Problem # 7:  CALLUS, RIGHT FOOT (ICD-700) Will refer to podiatry for help with foot care. Orders: Podiatry Referral (Podiatry)  Problem # 8:  EPISTAXIS (ICD-784.7) Resolved.  Probably exacerbated by dual antiplatelet therapy.  Rx is for stroke prophylaxis only.  Will decrease ASA to 81 mg daily.  Complete Medication List: 1)  Plavix 75 Mg Tabs (Clopidogrel  bisulfate) .Marland Kitchen.. 1 by mouth once daily 2)  Aspirin 325 Mg Tabs (Aspirin) .Marland Kitchen.. 1 by mouth once daily 3)  Lipitor 20 Mg Tabs (Atorvastatin calcium) .Marland Kitchen.. 1 by mouth once daily 4)  Haloperidol 5 Mg Tabs (Haloperidol) .Marland Kitchen.. 1 by mouth once daily(per [facility]) 5)  Benztropine Mesylate 1 Mg Tabs (Benztropine mesylate) .Marland Kitchen.. 1 by mouth two times a day(per [facility]) 6)  Ferrous Sulfate 325 (65 Fe) Mg Tbec (Ferrous sulfate) .... Take one tablet by mouth three times a day  Other Orders: T-Ferritin ([id number])  Patient Instructions: 1)  Please schedule a follow-up appointment in 6 months to follow up on choleterol and carotid stenosis.  2)  Please return for lab work one(1) week before your next appointment. (CMET; CBC; Fasting Lipids - ICD9: 285.9, 272.4) 3)  We will refer you to podiatry for your right foot callus.

## 2010-06-09 NOTE — Letter (Signed)
[facility]: [facility]   Imported By: Arta Bruce 04/19/2010 16:23:08  _____________________________________________________________________  External Attachment:    Type:   Image     Comment:   External Document

## 2010-07-26 ENCOUNTER — Telehealth (INDEPENDENT_AMBULATORY_CARE_PROVIDER_SITE_OTHER): Payer: Self-pay | Admitting: Nurse Practitioner

## 2010-08-04 NOTE — Progress Notes (Signed)
Summary: Query:  Refill Plavix?  Phone Note Call from Patient Call back at Home Phone [phone]   Reason for Call: Refill Medication Summary of Call: Ivan Daniels. Ivan Daniels SISTER, Ivan Daniels STOPPED BY BECAUSE SHE HAS BEEN TRYING TO GET HIS PLAVIX REFILLED SINCE LAST WEEK. [facility] [address]. Initial call taken by: Leodis Rains,  July 26, 2010 2:41 PM  Follow-up for Phone Call        Only received request for Plavix yesterday.  Last seen early November -- refill Plavix per protocol?  Has f/u appt. in May. Follow-up by: Dutch Quint RN,  July 26, 2010 3:40 PM  Additional Follow-up for Phone Call Additional follow up Details #1::        refill done notify sister to check at the pharmacy for availability Additional Follow-up by: Lehman Prom FNP,  July 26, 2010 3:59 PM    Additional Follow-up for Phone Call Additional follow up Details #2::    Sister notified.  Dutch Quint RN  July 26, 2010 4:15 PM   Prescriptions: PLAVIX 75 MG TABS (CLOPIDOGREL BISULFATE) 1 by mouth once daily  #30 Tablet x 11   Entered and Authorized by:   Lehman Prom FNP   Signed by:   Lehman Prom FNP on 07/26/2010   Method used:   [facility]  [address] [phone][facility] (retail)       [address]       [place], [place]  [id number]       Ph: [phone] or [phone]       Fax: [phone]   RxID:   [id number]

## 2010-09-20 NOTE — Op Note (Signed)
NAME:  Ivan Daniels, FARRIOR NO.:  [id number]   MEDICAL RECORD NO.:  [id number]          PATIENT TYPE:  AMB   LOCATION:  ENDO                         FACILITY:  [facility]   PHYSICIAN:  John C. Madilyn Fireman, M.D.    DATE OF BIRTH:  18-Aug-1945   DATE OF PROCEDURE:  03/16/2008  DATE OF DISCHARGE:                               OPERATIVE REPORT   PROCEDURE:  Esophagogastroduodenoscopy.   INDICATION FOR PROCEDURE:  Severe anemia with recent transfusion for  hemoglobin of 6.  No hematemesis, melena, or hematochezia.  He has had  colonoscopy done in 2006, which was normal.   PROCEDURE:  The patient was placed in the left lateral decubitus  position and placed on the pulse monitor with continuous low-flow oxygen  delivered via nasal cannula.  He was sedated with 50 mcg of IV fentanyl  and 5 mg of IV Versed.  The Olympus video endoscope was advanced under  direct vision into the oropharynx and esophagus.  There was a typical  appearance of scattered white plaques consistent with candidal  esophagitis primarily in the proximal and middle esophagus.  Other than  the white plaques, there was no associated visible inflammation.  The  distal esophagus and GE junction was relatively normal.  There was no  hiatal hernia, ring, stricture, or other abnormality of the GE junction.  The stomach was entered and a small amount of liquid secretions were  suctioned from the fundus.  Retroflexed view of cardia was unremarkable.  The fundus, body, antrum, and pylorus all appeared normal.  The duodenum  was entered and both bulb and second portion were well inspected.  Within the bulb, there were 2 small AVMs, which were cauterized with the  argon plasma coagulator.  No other abnormalities were seen in the  duodenum.  The scope was then withdrawn, and the patient returned to the  recovery room in stable condition.  He tolerated the procedure well.  There were no immediate complications.   IMPRESSION:  1.  Mild-to-moderate candidal esophagitis.  2. Two duodenal arteriovenous malformations, status post laser therapy      with the argon plasma coagulator.           ______________________________  Everardo All. Madilyn Fireman, M.D.     [facility]  D:  03/16/2008  T:  03/16/2008  Job:  [id number]   cc:   Fanny Dance. Rankins, M.D.

## 2010-09-23 NOTE — Op Note (Signed)
NAME:  Ivan Daniels, Ivan Daniels NO.:  [id number]   MEDICAL RECORD NO.:  [id number]          PATIENT TYPE:  AMB   LOCATION:  ENDO                         FACILITY:  [facility]   PHYSICIAN:  John C. Madilyn Fireman, M.D.    DATE OF BIRTH:  [birth year]   DATE OF PROCEDURE:  03/17/2005  DATE OF DISCHARGE:                                 OPERATIVE REPORT   OPERATION:  Colonoscopy.   INDICATIONS FOR PROCEDURE:  Weight loss in a 65 year old patient with no  prior colon screening.   PROCEDURE:  The patient was placed in the left lateral decubitus position  and placed on the pulse monitor with continuous low flow oxygen delivered by  nasal cannula.  He was sedated with 75 mcg IV Fentanyl and 7.5 mg IV Versed.  The Olympus video colonoscope was inserted into the rectum and advanced to  the cecum, confirmed by transillumination of McBurney's point and  visualization of the ileocecal valve and appendiceal orifice.  The prep was  good.  The cecum, ascending, transverse, descending, and sigmoid colon all  appeared normal with no masses, polyps, diverticula or other mucosal  abnormalities.  The rectum, likewise, appeared normal on retroflex view.  The anus revealed no obvious internal hemorrhoids.  The scope was then  withdrawn and the patient returned to the recovery room in stable condition.  He tolerated the procedure well and there were no immediate complications.   IMPRESSION:  Normal colonoscopy.   PLAN:  Further workup of weight loss per Dr. Barbaraann Barthel.           ______________________________  Everardo All Madilyn Fireman, M.D.     [facility]/MEDQ  D:  03/17/2005  T:  03/17/2005  Job:  [id number]   cc:   Fanny Dance. Rankins, M.D.  Fax: [phone]

## 2010-12-30 ENCOUNTER — Ambulatory Visit
Admission: RE | Admit: 2010-12-30 | Discharge: 2010-12-30 | Disposition: A | Discharge status: Home / Self Care | Payer: Medicaid Other | Source: Ambulatory Visit | Attending: Family Medicine | Admitting: Family Medicine

## 2010-12-30 ENCOUNTER — Other Ambulatory Visit: Payer: Self-pay | Admitting: Family Medicine

## 2010-12-30 DIAGNOSIS — R634 Abnormal weight loss: Secondary | ICD-10-CM

## 2010-12-30 DIAGNOSIS — F17200 Nicotine dependence, unspecified, uncomplicated: Secondary | ICD-10-CM

## 2011-02-02 LAB — CROSSMATCH
ABO/RH(D): A POS
Antibody Screen: NEGATIVE

## 2011-02-02 LAB — HEMATOCRIT: HCT: 30.4 — ABNORMAL LOW

## 2011-02-02 LAB — ABO/RH: ABO/RH(D): A POS

## 2011-02-07 LAB — CROSSMATCH
ABO/RH(D): A POS
Antibody Screen: NEGATIVE

## 2011-02-07 LAB — HEMATOCRIT: HCT: 28.9 — ABNORMAL LOW

## 2011-03-23 ENCOUNTER — Other Ambulatory Visit: Payer: Self-pay | Admitting: Physician Assistant

## 2013-02-25 ENCOUNTER — Ambulatory Visit (INDEPENDENT_AMBULATORY_CARE_PROVIDER_SITE_OTHER): Payer: Medicare Other

## 2013-02-25 VITALS — BP 176/89 | HR 64 | Resp 12 | Ht 71.0 in | Wt 125.0 lb

## 2013-02-25 DIAGNOSIS — I739 Peripheral vascular disease, unspecified: Secondary | ICD-10-CM

## 2013-02-25 DIAGNOSIS — M79609 Pain in unspecified limb: Secondary | ICD-10-CM

## 2013-02-25 DIAGNOSIS — Q828 Other specified congenital malformations of skin: Secondary | ICD-10-CM

## 2013-02-25 NOTE — Patient Instructions (Signed)
Peripheral Vascular Disease Peripheral Vascular Disease (PVD), also called Peripheral Arterial Disease (PAD), is a circulation problem caused by cholesterol (atherosclerotic plaque) deposits in the arteries. PVD commonly occurs in the lower extremities (legs) but it can occur in other areas of the body, such as your arms. The cholesterol buildup in the arteries reduces blood flow which can cause pain and other serious problems. The presence of PVD can place a person at risk for Coronary Artery Disease (CAD).  CAUSES  Causes of PVD can be many. It is usually associated with more than one risk factor such as:   High Cholesterol.  Smoking.  Diabetes.  Lack of exercise or inactivity.  High blood pressure (hypertension).  Obesity.  Family history. SYMPTOMS   When the lower extremities are affected, patients with PVD may experience:  Leg pain with exertion or physical activity. This is called INTERMITTENT CLAUDICATION. This may present as cramping or numbness with physical activity. The location of the pain is associated with the level of blockage. For example, blockage at the abdominal level (distal abdominal aorta) may result in buttock or hip pain. Lower leg arterial blockage may result in calf pain.  As PVD becomes more severe, pain can develop with less physical activity.  In people with severe PVD, leg pain may occur at rest.  Other PVD signs and symptoms:  Leg numbness or weakness.  Coldness in the affected leg or foot, especially when compared to the other leg.  A change in leg color.  Patients with significant PVD are more prone to ulcers or sores on toes, feet or legs. These may take longer to heal or may reoccur. The ulcers or sores can become infected.  If signs and symptoms of PVD are ignored, gangrene may occur. This can result in the loss of toes or loss of an entire limb.  Not all leg pain is related to PVD. Other medical conditions can cause leg pain such  as:  Blood clots (embolism) or Deep Vein Thrombosis.  Inflammation of the blood vessels (vasculitis).  Spinal stenosis. DIAGNOSIS  Diagnosis of PVD can involve several different types of tests. These can include:  Pulse Volume Recording Method (PVR). This test is simple, painless and does not involve the use of X-rays. PVR involves measuring and comparing the blood pressure in the arms and legs. An ABI (Ankle-Brachial Index) is calculated. The normal ratio of blood pressures is 1. As this number becomes smaller, it indicates more severe disease.  < 0.95  indicates significant narrowing in one or more leg vessels.  <0.8 there will usually be pain in the foot, leg or buttock with exercise.  <0.4 will usually have pain in the legs at rest.  <0.25  usually indicates limb threatening PVD.  Doppler detection of pulses in the legs. This test is painless and checks to see if you have a pulses in your legs/feet.  A dye or contrast material (a substance that highlights the blood vessels so they show up on x-ray) may be given to help your caregiver better see the arteries for the following tests. The dye is eliminated from your body by the kidney's. Your caregiver may order blood work to check your kidney function and other laboratory values before the following tests are performed:  Magnetic Resonance Angiography (MRA). An MRA is a picture study of the blood vessels and arteries. The MRA machine uses a large magnet to produce images of the blood vessels.  Computed Tomography Angiography (CTA). A CTA is a   specialized x-ray that looks at how the blood flows in your blood vessels. An IV may be inserted into your arm so contrast dye can be injected.  Angiogram. Is a procedure that uses x-rays to look at your blood vessels. This procedure is minimally invasive, meaning a small incision (cut) is made in your groin. A small tube (catheter) is then inserted into the artery of your groin. The catheter is  guided to the blood vessel or artery your caregiver wants to examine. Contrast dye is injected into the catheter. X-rays are then taken of the blood vessel or artery. After the images are obtained, the catheter is taken out. TREATMENT  Treatment of PVD involves many interventions which may include:  Lifestyle changes:  Quitting smoking.  Exercise.  Following a low fat, low cholesterol diet.  Control of diabetes.  Foot care is very important to the PVD patient. Good foot care can help prevent infection.  Medication:  Cholesterol-lowering medicine.  Blood pressure medicine.  Anti-platelet drugs.  Certain medicines may reduce symptoms of Intermittent Claudication.  Interventional/Surgical options:  Angioplasty. An Angioplasty is a procedure that inflates a balloon in the blocked artery. This opens the blocked artery to improve blood flow.  Stent Implant. A wire mesh tube (stent) is placed in the artery. The stent expands and stays in place, allowing the artery to remain open.  Peripheral Bypass Surgery. This is a surgical procedure that reroutes the blood around a blocked artery to help improve blood flow. This type of procedure may be performed if Angioplasty or stent implants are not an option. SEEK IMMEDIATE MEDICAL CARE IF:   You develop pain or numbness in your arms or legs.  Your arm or leg turns cold, becomes blue in color.  You develop redness, warmth, swelling and pain in your arms or legs. MAKE SURE YOU:   Understand these instructions.  Will watch your condition.  Will get help right away if you are not doing well or get worse. Document Released: 06/01/2004 Document Revised: 07/17/2011 Document Reviewed: 04/28/2008 [employer] Patient Information 2014 [employer].  
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
## 2013-10-10 ENCOUNTER — Ambulatory Visit (INDEPENDENT_AMBULATORY_CARE_PROVIDER_SITE_OTHER): Payer: Medicare Other

## 2013-10-10 VITALS — BP 126/56 | HR 70 | Resp 16

## 2013-10-10 DIAGNOSIS — M79609 Pain in unspecified limb: Secondary | ICD-10-CM

## 2013-10-10 DIAGNOSIS — Q828 Other specified congenital malformations of skin: Secondary | ICD-10-CM

## 2013-10-10 DIAGNOSIS — I739 Peripheral vascular disease, unspecified: Secondary | ICD-10-CM

## 2013-10-10 NOTE — Progress Notes (Signed)
   Subjective:    Patient ID: Ivan Daniels, male    DOB: [birth year], 68 y.o.   MRN: [id number]  HPI Comments: "Check my feet and trim them"  Trim toenail 5th toe right  Calluses: Sub 2nd MPJ and lateral 5th MPJ right / Sub 4th MPJ and 2nd and 3rd metatarsal stump left     Review of Systems no new findings or systemic changes noted this time.     Objective:   Physical Exam Okay objective findings as follows patient does have dorsalis pedis pulse one over 4 on the right and left sides PT pulse bilateral zero over four absent sensations noted on Semmes Weinstein testing to the digit to the digits patient amputated 9 digits intact fifth toe right foot only remaining digits have been amputated however the stump site showed hyperkeratoses and pre-ulceration there is sub-1 right and distal second third metatarsal right having he opted for a keratoses and sub-1 left and subsecond MTP area or distal second. Left also have punctate keratotic lesions which are debrided and dressed with lumicain Neosporin and Band-Aid or gauze dressings. Patient does have diminished neurovascular status history of amputations with abnormal architecture in plantigrade metatarsals and osteoarthropathy atrophy of fat pad keratotic lesion debridement at this time followup in the future as needed      Assessment & Plan:  Assessment history peripheral arterial disease her PhD in vascular compromise and angiopathy at this time debridement of multiple 4 keratoses the presence of peripheral vascular compromise and complications no active ulcers noted no secondary infection is noted keratoses are debrided Neosporin and Band-Aid dressings applied recheck in 3-6 months or as needed for future care and followup  Harriet Masson DPM

## 2013-10-10 NOTE — Patient Instructions (Signed)
Place peripheral vascular disease patient instructions here.

## 2014-01-09 ENCOUNTER — Ambulatory Visit (INDEPENDENT_AMBULATORY_CARE_PROVIDER_SITE_OTHER): Payer: Medicare Other

## 2014-01-09 DIAGNOSIS — M79609 Pain in unspecified limb: Secondary | ICD-10-CM

## 2014-01-09 DIAGNOSIS — S98139A Complete traumatic amputation of one unspecified lesser toe, initial encounter: Secondary | ICD-10-CM

## 2014-01-09 DIAGNOSIS — M79673 Pain in unspecified foot: Secondary | ICD-10-CM

## 2014-01-09 DIAGNOSIS — L851 Acquired keratosis [keratoderma] palmaris et plantaris: Secondary | ICD-10-CM

## 2014-01-09 DIAGNOSIS — Q828 Other specified congenital malformations of skin: Secondary | ICD-10-CM

## 2014-01-09 DIAGNOSIS — I739 Peripheral vascular disease, unspecified: Secondary | ICD-10-CM

## 2014-01-09 NOTE — Patient Instructions (Signed)
WEARING INSTRUCTIONS FOR ORTHOTICS or new shoes and insoles  Don't expect to be comfortable wearing your orthotic devices for the first time.  Like eyeglasses, you may be aware of them as time passes, they will not be uncomfortable and you will enjoy wearing them.  FOLLOW THESE INSTRUCTIONS EXACTLY!  1. Wear your orthotic devices for:       Not more than 1 hour the first day.       Not more than 2 hours the second day.       Not more than 3 hours the third day and so on.        Or wear them for as long as they feel comfortable.       If you experience discomfort in your feet or legs take them out.  When feet & legs feel       better, put them back in.  You do need to be consistent and wear them a little        everyday. 2.   If at any time the orthotic devices become acutely uncomfortable before the       time for that particular day, STOP WEARING THEM. 3.   On the next day, do not increase the wearing time. 4.   Subsequently, increase the wearing time by 15-30 minutes only if comfortable to do       so. 5.   You will be seen by your doctor about 2-4 weeks after you receive your orthotic       devices, at which time you will probably be wearing your devices comfortably        for about 8 hours or more a day. 6.   Some patients occasionally report mild aches or discomfort in other parts of the of       body such as the knees, hips or back after 3 or 4 consecutive hours of wear.  If this       is the case with you, do not extend your wearing time.  Instead, cut it back an hour or       two.  In all likelihood, these symptoms will disappear in a short period of time as your       body posture realigns itself and functions more efficiently. 7.   It is possible that your orthotic device may require some small changes or adjustment       to improve their function or make them more comfortable.   This is usually not done       before one to three months have elapsed.  These adjustments are made  in        accordance with the changed position your feet are assuming as a result of       improved biomechanical function. 8.   In women's shoes, it's not unusual for your heel to slip out of the shoe, particularly if       they are step-in-shoes.  If this is the case, try other shoes or other styles.  Try to       purchase shoes which have deeper heal seats or higher heel counters. 9.   Squeaking of orthotics devices in the shoes is due to the movement of the devices       when they are functioning normally.  To eliminate squeaking, simply dust some       baby powder into your shoes before inserting the devices.  If this does not  work,        apply soap or wax to the edges of the orthotic devices or put a tissue into the shoes. 10. It is important that you follow these directions explicitly.  Failure to do so will simply       prolong the adjustment period or create problems which are easily avoided.  It makes       no difference if you are wearing your orthotic devices for only a few hours after        several months, so long as you are wearing them comfortably for those hours. 11. If you have any questions or complaints, contact our office.  We have no way of       knowing about your problems unless you tell us.  If we do not hear from you, we will       assume that you are proceeding well.

## 2014-01-09 NOTE — Progress Notes (Signed)
   Subjective:    Patient ID: Ivan Daniels, male    DOB: [birth year], 68 y.o.   MRN: [id number]  HPI Comments: "Trim the toenail and the calluses"  Trim toenail 5th toe right  Calluses: Sub 2nd MPJ and lateral 5th MPJ right / Sub 4th MPJ and 2nd and 3rd metatarsal stump left      Review of Systems no new findings or systemic changes noted     Objective:   Physical Exam Neurovascular status is diminished with thready pulses DP and PT bilateral patient had amputated multiple toes has multiple keratoses distal stump site left sub-first toe stump site on the right and sub-fourth toe stump site on the left is also keratoses lateral fifth MTP area right groin digit left is 80 atrophy fifth toe right foot. 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 No open wounds no current ulcers no signs of infection patient has intact sensation although very thin trophic skin changes noted       Assessment & Plan:  Assessment history of vascular compromise and angiopathy history of previous amputation of toes difficult exposure in injury patient has history of ulceration also porokeratosis the scars with history of amputation skin grafts. This time keratotic lesion is debrided and dispensed patient free of charge a pair of diabetic type extra-depth shoes apex 1200 she was dispensed with Plastizote inlays to accommodate the foot and cushion and help prevent keratoses and ulceration. Patient asked about diabetic shoes however without diabetes has not no coverage from this a pair shoes I never been pickup are dispensed and are furnished the patient this time free of charge. Followup in 3 months for palliative care in the future as needed  Harriet Masson DPM

## 2014-04-14 ENCOUNTER — Ambulatory Visit (INDEPENDENT_AMBULATORY_CARE_PROVIDER_SITE_OTHER): Payer: Medicare Other

## 2014-04-14 VITALS — BP 123/66 | HR 74 | Resp 12

## 2014-04-14 DIAGNOSIS — S98139S Complete traumatic amputation of one unspecified lesser toe, sequela: Secondary | ICD-10-CM

## 2014-04-14 DIAGNOSIS — Q828 Other specified congenital malformations of skin: Secondary | ICD-10-CM

## 2014-04-14 DIAGNOSIS — M79673 Pain in unspecified foot: Secondary | ICD-10-CM

## 2014-04-14 DIAGNOSIS — I739 Peripheral vascular disease, unspecified: Secondary | ICD-10-CM

## 2014-04-14 DIAGNOSIS — L851 Acquired keratosis [keratoderma] palmaris et plantaris: Secondary | ICD-10-CM
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
## 2014-07-14 ENCOUNTER — Ambulatory Visit (INDEPENDENT_AMBULATORY_CARE_PROVIDER_SITE_OTHER): Payer: Medicare Other | Admitting: Podiatry

## 2014-07-14 ENCOUNTER — Encounter: Payer: Self-pay | Admitting: Podiatry

## 2014-07-14 VITALS — BP 120/69 | HR 63

## 2014-07-14 DIAGNOSIS — I739 Peripheral vascular disease, unspecified: Secondary | ICD-10-CM

## 2014-07-14 DIAGNOSIS — L851 Acquired keratosis [keratoderma] palmaris et plantaris: Secondary | ICD-10-CM

## 2014-07-14 DIAGNOSIS — Q828 Other specified congenital malformations of skin: Secondary | ICD-10-CM

## 2014-07-14 DIAGNOSIS — M79673 Pain in unspecified foot: Secondary | ICD-10-CM | POA: Diagnosis not present

## 2014-07-14 DIAGNOSIS — S98139S Complete traumatic amputation of one unspecified lesser toe, sequela: Secondary | ICD-10-CM

## 2014-07-14 NOTE — Progress Notes (Signed)
He presents today for follow-up of his pore keratotic lesions plantar aspect of the bilateral foot.  Objective: Pulses are palpable. Porokeratosis plantar aspect bilateral foot.  Assessment: Porokeratosis.  Plan: Treatment on reactive hyperkeratosis bilateral.

## 2014-10-20 ENCOUNTER — Ambulatory Visit: Payer: Medicare Other

## 2014-10-22 ENCOUNTER — Ambulatory Visit (INDEPENDENT_AMBULATORY_CARE_PROVIDER_SITE_OTHER): Payer: Medicare Other | Admitting: Podiatry

## 2014-10-22 ENCOUNTER — Encounter: Payer: Self-pay | Admitting: Podiatry

## 2014-10-22 VITALS — BP 122/66 | HR 85 | Resp 18

## 2014-10-22 DIAGNOSIS — S98139S Complete traumatic amputation of one unspecified lesser toe, sequela: Secondary | ICD-10-CM

## 2014-10-22 DIAGNOSIS — Q828 Other specified congenital malformations of skin: Secondary | ICD-10-CM | POA: Diagnosis not present
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
## 2015-01-25 ENCOUNTER — Encounter (HOSPITAL_COMMUNITY): Payer: Self-pay | Admitting: Emergency Medicine

## 2015-01-25 ENCOUNTER — Other Ambulatory Visit: Payer: Self-pay | Admitting: Family

## 2015-01-25 ENCOUNTER — Inpatient Hospital Stay (HOSPITAL_COMMUNITY): Payer: Medicare Other

## 2015-01-25 ENCOUNTER — Inpatient Hospital Stay (HOSPITAL_COMMUNITY)
Admission: EM | Admit: 2015-01-25 | Discharge: 2015-01-29 | DRG: 988 | Disposition: A | Discharge status: Home / Self Care | Payer: Medicare Other | Attending: Thoracic Surgery (Cardiothoracic Vascular Surgery) | Admitting: Thoracic Surgery (Cardiothoracic Vascular Surgery)

## 2015-01-25 ENCOUNTER — Ambulatory Visit
Admission: RE | Admit: 2015-01-25 | Discharge: 2015-01-25 | Disposition: A | Discharge status: Home / Self Care | Payer: Medicaid Other | Source: Ambulatory Visit | Attending: Family | Admitting: Family

## 2015-01-25 DIAGNOSIS — Z79899 Other long term (current) drug therapy: Secondary | ICD-10-CM

## 2015-01-25 DIAGNOSIS — I672 Cerebral atherosclerosis: Secondary | ICD-10-CM | POA: Diagnosis present

## 2015-01-25 DIAGNOSIS — Z7982 Long term (current) use of aspirin: Secondary | ICD-10-CM | POA: Diagnosis not present

## 2015-01-25 DIAGNOSIS — I6522 Occlusion and stenosis of left carotid artery: Secondary | ICD-10-CM | POA: Diagnosis present

## 2015-01-25 DIAGNOSIS — R05 Cough: Secondary | ICD-10-CM

## 2015-01-25 DIAGNOSIS — F1010 Alcohol abuse, uncomplicated: Secondary | ICD-10-CM | POA: Diagnosis present

## 2015-01-25 DIAGNOSIS — C3411 Malignant neoplasm of upper lobe, right bronchus or lung: Secondary | ICD-10-CM | POA: Diagnosis present

## 2015-01-25 DIAGNOSIS — Z888 Allergy status to other drugs, medicaments and biological substances status: Secondary | ICD-10-CM

## 2015-01-25 DIAGNOSIS — L570 Actinic keratosis: Secondary | ICD-10-CM | POA: Diagnosis present

## 2015-01-25 DIAGNOSIS — D649 Anemia, unspecified: Secondary | ICD-10-CM | POA: Diagnosis present

## 2015-01-25 DIAGNOSIS — C771 Secondary and unspecified malignant neoplasm of intrathoracic lymph nodes: Secondary | ICD-10-CM | POA: Diagnosis present

## 2015-01-25 DIAGNOSIS — J939 Pneumothorax, unspecified: Secondary | ICD-10-CM

## 2015-01-25 DIAGNOSIS — E785 Hyperlipidemia, unspecified: Secondary | ICD-10-CM | POA: Diagnosis present

## 2015-01-25 DIAGNOSIS — R918 Other nonspecific abnormal finding of lung field: Secondary | ICD-10-CM | POA: Diagnosis present

## 2015-01-25 DIAGNOSIS — N400 Enlarged prostate without lower urinary tract symptoms: Secondary | ICD-10-CM | POA: Diagnosis present

## 2015-01-25 DIAGNOSIS — Z89429 Acquired absence of other toe(s), unspecified side: Secondary | ICD-10-CM | POA: Diagnosis not present

## 2015-01-25 DIAGNOSIS — Z7902 Long term (current) use of antithrombotics/antiplatelets: Secondary | ICD-10-CM | POA: Diagnosis not present

## 2015-01-25 DIAGNOSIS — J9311 Primary spontaneous pneumothorax: Secondary | ICD-10-CM | POA: Diagnosis present

## 2015-01-25 DIAGNOSIS — J9811 Atelectasis: Secondary | ICD-10-CM

## 2015-01-25 DIAGNOSIS — R059 Cough, unspecified: Secondary | ICD-10-CM

## 2015-01-25 DIAGNOSIS — F17210 Nicotine dependence, cigarettes, uncomplicated: Secondary | ICD-10-CM | POA: Diagnosis present

## 2015-01-25 DIAGNOSIS — C799 Secondary malignant neoplasm of unspecified site: Secondary | ICD-10-CM

## 2015-01-25 DIAGNOSIS — Z9689 Presence of other specified functional implants: Secondary | ICD-10-CM

## 2015-01-25 DIAGNOSIS — F200 Paranoid schizophrenia: Secondary | ICD-10-CM | POA: Diagnosis present

## 2015-01-25 HISTORY — DX: Other nonspecific abnormal finding of lung field: R91.8

## 2015-01-25 HISTORY — DX: Alcohol abuse, uncomplicated: F10.10

## 2015-01-25 HISTORY — DX: Nicotine dependence, unspecified, uncomplicated: F17.200

## 2015-01-25 HISTORY — DX: Paranoid schizophrenia: F20.0

## 2015-01-25 HISTORY — DX: Personal history of other specified conditions: Z87.898

## 2015-01-25 HISTORY — DX: Primary spontaneous pneumothorax: J93.11

## 2015-01-25 LAB — BASIC METABOLIC PANEL
Anion gap: 9 (ref 5–15)
BUN: 9 mg/dL (ref 6–20)
CO2: 25 mmol/L (ref 22–32)
Calcium: 9.1 mg/dL (ref 8.9–10.3)
Chloride: 103 mmol/L (ref 101–111)
Creatinine, Ser: 1.09 mg/dL (ref 0.61–1.24)
GFR calc Af Amer: >60 mL/min (ref >60)
GFR calc non Af Amer: >60 mL/min (ref >60)
Glucose, Bld: 98 mg/dL (ref 65–99)
Potassium: 4.3 mmol/L (ref 3.5–5.1)
Sodium: 137 mmol/L (ref 135–145)

## 2015-01-25 LAB — CBC
HCT: 42.3 % (ref 39.0–52.0)
Hemoglobin: 14.1 g/dL (ref 13.0–17.0)
MCH: 29.8 pg (ref 26.0–34.0)
MCHC: 33.3 g/dL (ref 30.0–36.0)
MCV: 89.4 fL (ref 78.0–100.0)
Platelets: 180 10*3/uL (ref 150–400)
RBC: 4.73 MIL/uL (ref 4.22–5.81)
RDW: 15 % (ref 11.5–15.5)
WBC: 5.5 10*3/uL (ref 4.0–10.5)

## 2015-01-25 MED ORDER — IOHEXOL 300 MG/ML  SOLN
75.0000 mL | Freq: Once | INTRAMUSCULAR | Status: AC | PRN
  Administered 2015-01-25: 75 mL via INTRAVENOUS

## 2015-01-25 MED ORDER — DOCUSATE SODIUM 100 MG PO CAPS
100.0000 mg | ORAL_CAPSULE | Freq: Two times a day (BID) | ORAL | Status: DC
  Administered 2015-01-25 – 2015-01-29 (×7): 100 mg via ORAL
  Filled 2015-01-25 (×7): qty 1

## 2015-01-25 MED ORDER — ACETAMINOPHEN 325 MG PO TABS: 650.0000 mg | ORAL_TABLET | Freq: Four times a day (QID) | ORAL | Status: DC | PRN

## 2015-01-25 MED ORDER — MORPHINE SULFATE (PF) 2 MG/ML IV SOLN
1.0000 mg | INTRAVENOUS | Status: DC | PRN
  Administered 2015-01-28: 1 mg via INTRAVENOUS
  Filled 2015-01-25: qty 1

## 2015-01-25 MED ORDER — HALOPERIDOL 5 MG PO TABS
5.0000 mg | ORAL_TABLET | Freq: Every day | ORAL | Status: DC
  Administered 2015-01-25 – 2015-01-28 (×4): 5 mg via ORAL
  Filled 2015-01-25 (×6): qty 1

## 2015-01-25 MED ORDER — MIDAZOLAM HCL 2 MG/2ML IJ SOLN
1.0000 mg | Freq: Once | INTRAMUSCULAR | Status: AC
  Administered 2015-01-25: 1 mg via INTRAVENOUS
  Filled 2015-01-25: qty 2

## 2015-01-25 MED ORDER — MORPHINE SULFATE (PF) 2 MG/ML IV SOLN
2.0000 mg | Freq: Once | INTRAVENOUS | Status: AC
  Administered 2015-01-25: 2 mg via INTRAVENOUS
  Filled 2015-01-25: qty 1

## 2015-01-25 MED ORDER — ENOXAPARIN SODIUM 30 MG/0.3ML ~~LOC~~ SOLN
30.0000 mg | SUBCUTANEOUS | Status: DC
  Administered 2015-01-26: 30 mg via SUBCUTANEOUS
  Filled 2015-01-25 (×2): qty 0.3

## 2015-01-25 MED ORDER — OXYCODONE HCL 5 MG PO TABS
10.0000 mg | ORAL_TABLET | ORAL | Status: DC | PRN
  Administered 2015-01-27: 10 mg via ORAL
  Filled 2015-01-25: qty 2

## 2015-01-25 MED ORDER — BENZTROPINE MESYLATE 0.5 MG PO TABS
0.5000 mg | ORAL_TABLET | Freq: Every day | ORAL | Status: DC
  Administered 2015-01-25 – 2015-01-28 (×4): 0.5 mg via ORAL
  Filled 2015-01-25 (×6): qty 1

## 2015-01-25 MED ORDER — LEVOFLOXACIN 500 MG PO TABS
500.0000 mg | ORAL_TABLET | Freq: Every day | ORAL | Status: DC
  Administered 2015-01-26 – 2015-01-29 (×3): 500 mg via ORAL
  Filled 2015-01-25 (×3): qty 1

## 2015-01-25 MED ORDER — LIDOCAINE HCL (PF) 1 % IJ SOLN
30.0000 mL | Freq: Once | INTRAMUSCULAR | Status: AC
  Administered 2015-01-25: 30 mL
  Filled 2015-01-25: qty 30

## 2015-01-25 MED ORDER — OXYCODONE HCL 5 MG PO TABS
5.0000 mg | ORAL_TABLET | ORAL | Status: DC | PRN
Start: 2015-01-25 — End: 2015-01-29

## 2015-01-25 MED ORDER — SIMVASTATIN 40 MG PO TABS
40.0000 mg | ORAL_TABLET | Freq: Every day | ORAL | Status: DC
  Administered 2015-01-25 – 2015-01-28 (×4): 40 mg via ORAL
  Filled 2015-01-25 (×6): qty 1

## 2015-01-25 MED ORDER — ADULT MULTIVITAMIN W/MINERALS CH
1.0000 | ORAL_TABLET | Freq: Every day | ORAL | Status: DC
  Administered 2015-01-26 – 2015-01-29 (×3): 1 via ORAL
  Filled 2015-01-25 (×4): qty 1

## 2015-01-25 MED ORDER — ASPIRIN EC 81 MG PO TBEC
81.0000 mg | DELAYED_RELEASE_TABLET | Freq: Every day | ORAL | Status: DC
  Administered 2015-01-26 – 2015-01-29 (×3): 81 mg via ORAL
  Filled 2015-01-25 (×3): qty 1

## 2015-01-25 NOTE — ED Notes (Signed)
Requested meds from pharmacy  

## 2015-01-25 NOTE — Op Note (Signed)
CARDIOTHORACIC SURGERY OPERATIVE NOTE  Date of Procedure:  01/25/2015  Preoperative Diagnosis: Left Spontaneous Pneumothorax  Postoperative Diagnosis: Same  Procedure:   Left chest tube placement  Surgeon:   Valentina Gu. Roxy Manns, MD  Anesthesia:   1% lidocaine local with intravenous sedation    DETAILS OF THE OPERATIVE PROCEDURE  Following full informed consent the patient was given morphine sulfate 2 mg and midazolam 1 mg intravenously and continuously monitored for rhythm, BP and oxygen saturation. The left chest was prepared and draped in a sterile manner. 1% lidocaine was utilized to anesthetize the skin and subcutaneous tissues. A small incision was made and a 28 French straight chest tube was placed through the incision into the pleural space. The tube was secured to the skin and connected to a closed suction collection device. The patient tolerated the procedure well. A portable CXR was ordered. There were no complications.    Valentina Gu. Roxy Manns, MD 01/25/2015 5:38 PM

## 2015-01-25 NOTE — ED Notes (Signed)
CT on the way as soon as a table opens

## 2015-01-25 NOTE — H&P (Signed)
[address]       [place] [address]             [phone]          CARDIOTHORACIC SURGERY HISTORY AND PHYSICAL EXAM  PCP is [name], MD Referring Provider is POLLINA, Gwenyth Allegra, MD   Chief Complaint:  cough  HPI:  Patient is a 69 year old African-American male from [place] with no previous history of spontaneous pneumothorax who describes a 1-2 week history of persistent cough and respiratory congestion. He was evaluated in his primary care physician's office several days ago and a chest x-ray was ordered. Chest x-ray performed earlier today demonstrates large left pneumothorax. The patient was instructed to go directly to the emergency department.  The patient lives with his sister locally in [place]. He has been out of work for many years and has been treated for schizophrenia. He has had problems with long-standing tobacco abuse and intermittent polysubstance abuse. He denies any recent history of trauma. He denies any history of previous pneumothorax. He states that he began developing a cough more than a week ago. He has not had associated shortness of breath. He denies any chest pain. Cough has been productive of whitish sputum. He has not had fevers or chills.  Past Medical History  Diagnosis Date  . Hyperlipidemia   . TOBACCO ABUSE 05/24/2009    Qualifier: Diagnosis of  By: Hassell Done FNP, Tori Milks    . Paranoid schizophrenia 10/31/2009    Qualifier: Diagnosis of  By: Jorene Minors, Scott    . BENIGN PROSTATIC HYPERTROPHY, HX OF 12/19/2006    Qualifier: Diagnosis of  By: Radene Ou MD, [place]    . ALCOHOL ABUSE 11/12/2009    Qualifier: Diagnosis of  By: Hassell Done FNP, Tori Milks    . Primary spontaneous pneumothorax, left 01/25/2015    History reviewed. No pertinent past surgical history.  History reviewed. No pertinent family history.  Social History Social History  Substance Use Topics  . Smoking status: Current Every Day Smoker --  0.50 packs/day for 30 years    Types: Cigarettes  . Smokeless tobacco: None  . Alcohol Use: None    Prior to Admission medications   Medication Sig Start Date End Date Taking? Authorizing Provider  aspirin EC 81 MG tablet Take 81 mg by mouth daily.   Yes Historical Provider, MD  benztropine (COGENTIN) 0.5 MG tablet Take 0.5 mg by mouth at bedtime.  02/24/13  Yes Historical Provider, MD  ferrous fumarate (HEMOCYTE - 106 MG FE) 325 (106 FE) MG TABS tablet Take 1 tablet by mouth.   Yes Historical Provider, MD  haloperidol (HALDOL) 5 MG tablet Take 5 mg by mouth at bedtime.  02/21/13  Yes Historical Provider, MD  levofloxacin (LEVAQUIN) 500 MG tablet Take 500 mg by mouth daily. 01/22/15  Yes Historical Provider, MD  Multiple Vitamins-Minerals (MULTIVITAMIN WITH MINERALS) tablet Take 1 tablet by mouth daily.   Yes Historical Provider, MD  simvastatin (ZOCOR) 20 MG tablet  02/14/13  Yes Historical Provider, MD  aspirin 120 MG suppository Place 120 mg rectally every 6 (six) hours as needed for fever.    Historical Provider, MD  clopidogrel (PLAVIX) 75 MG tablet  12/09/12   Historical Provider, MD    Allergies  Allergen Reactions  . Benztropine Mesylate     REACTION: decrease ROM neck  . Chlorpromazine Hcl     Review of Systems:  General:  normal appetite, normal energy   Respiratory:  +  cough, no wheezing, no hemoptysis, no pain with inspiration or cough, no shortness of breath   Cardiac:   no chest pain or tightness, no exertional SOB, no resting SOB, no PND, no orthopnea, no LE edema, no palpitations, no syncope  GI:   no difficulty swallowing, no hematochezia, no hematemesis, no melena, no constipation, no diarrhea   GU:   no dysuria, no urgency, no frequency   Musculoskeletal: no arthritis, no arthralgia   Vascular:  no pain suggestive of claudication   Neuro:   no symptoms suggestive of TIA's, no seizures, no headaches, no peripheral neuropathy   Endocrine:  Negative   HEENT:  no loose  teeth or painful teeth,  no recent vision changes  Psych:   no anxiety, no depression    Physical Exam:   BP 153/91 mmHg  Pulse 85  Temp(Src) 97.9 F (36.6 C) (Oral)  Resp 17  Ht '5\' 11"'$  (1.803 m)  Wt 56.7 kg (125 lb)  BMI 17.44 kg/m2  SpO2 98%  General:  Thin, chronically ill-appearing  HEENT:  Unremarkable   Neck:   no JVD, no bruits, no adenopathy   Chest:   clear to auscultation, diminished breath sounds on left side, no wheezes, no rhonchi   CV:   RRR, no  murmur   Abdomen:  soft, non-tender, no masses   Extremities:  warm, well-perfused, pulses diminished - s/p amputation of toes both feet  Rectal/GU  Deferred  Neuro:   Grossly non-focal and symmetrical throughout  Skin:   Clean and dry, no rashes, no breakdown  Diagnostic Tests:  CHEST 2 VIEW  COMPARISON: 12/30/2010  FINDINGS: Mediastinum is normal. Heart size normal. Right suprahilar infiltrate versus mass lesion noted. Prominent left pneumothorax with some degree of tension. Tiny nodular opacities projected over both lungs consistent with nipple shadows, similar findings noted on prior study . Left pleural effusion. No acute bony abnormality.  IMPRESSION: 1. Tension left pneumothorax. 2. Right suprahilar infiltrate versus mass. Critical Value/emergent results were called by telephone at the time of interpretation on 01/25/2015 at 2:03 pm to nurse Maudie Mercury , who verbally acknowledged these results.   Electronically Signed  By: Marcello Moores Register  On: 01/25/2015 14:06    Impression:  Primary left spontaneous pneumothorax. The patient has long-standing history of tobacco abuse and may have significant underlying COPD. He denies any history of recent trauma. Chest radiograph also demonstrates opacity in the right suprahilar region which could represent developing pneumonia versus underlying parenchymal mass.    Plan:  I have discussed the nature of primary spontaneous pneumothorax with the patient and  his sister at the bedside in the emergency department. Options for treatment and discussed. I recommend chest tube placement followed by  CT scan of the chest to further evaluate the presence of the right suprahilar opacity. Patient will be admitted to the hospital following chest tube placement.  The indications, risks, and potential benefits of chest tube placement have been explained in detail. All of their questions been addressed.   I spent in excess of 30 minutes during the conduct of this hospital encounter and >50% of this time involved direct face-to-face encounter with the patient for counseling and/or coordination of their care.   Valentina Gu. Roxy Manns, MD 01/25/2015 5:22 PM

## 2015-01-25 NOTE — ED Notes (Signed)
Meal tray has been ordered.

## 2015-01-25 NOTE — ED Notes (Signed)
Pt from PCP for tension pneumo to left lung, pt and family report dry cough x3 weeks. Pt denies any pain. Diminished lung sounds to left side. Pt reports no pain, no cp, no sob. axox 4.

## 2015-01-25 NOTE — ED Notes (Signed)
Attempted report to [facility]. They state they are capped and are unable to take the pt due to staffing. Unit informed that the surgeon stated before he left to not allow the pt go to any other unit rt chest tube placement.

## 2015-01-25 NOTE — ED Provider Notes (Signed)
CSN: [id number]     Arrival date & time 01/25/15  1429 History   First MD Initiated Contact with Patient 01/25/15 1506     Chief Complaint  Patient presents with  . pneumothorax    . Cough     (Consider location/radiation/quality/duration/timing/severity/associated sxs/prior Treatment) HPI Comments: Patient presents to the ER for evaluation of pneumothorax. Patient has been experiencing a dry cough for several weeks. He saw his doctor 3 days ago for this and was prescribed an antibiotic. He also had a chest x-ray ordered. The chest x-ray was not performed until today. When the x-ray was performed at [facility], it was noted that he had a left-sided pneumothorax. He was told to come immediately to the ER for further treatment.  Spitting any chest pain. He does not have any significant shortness of breath.  Patient is a 69 y.o. male presenting with cough.  Cough   Past Medical History  Diagnosis Date  . Hyperlipidemia    No past surgical history on file. No family history on file. Social History  Substance Use Topics  . Smoking status: Current Every Day Smoker  . Smokeless tobacco: None  . Alcohol Use: None    Review of Systems  Respiratory: Positive for cough.   All other systems reviewed and are negative.     Allergies  Benztropine mesylate and Chlorpromazine hcl  Home Medications   Prior to Admission medications   Medication Sig Start Date End Date Taking? Authorizing Provider  aspirin 120 MG suppository Place 120 mg rectally every 6 (six) hours as needed for fever.    Historical Provider, MD  benztropine (COGENTIN) 0.5 MG tablet  02/24/13   Historical Provider, MD  clopidogrel (PLAVIX) 75 MG tablet  12/09/12   Historical Provider, MD  ferrous fumarate (HEMOCYTE - 106 MG FE) 325 (106 FE) MG TABS tablet Take 1 tablet by mouth.    Historical Provider, MD  haloperidol (HALDOL) 5 MG tablet  02/21/13   Historical Provider, MD  Multiple Vitamins-Minerals  (MULTIVITAMIN WITH MINERALS) tablet Take 1 tablet by mouth daily.    Historical Provider, MD  simvastatin (ZOCOR) 20 MG tablet  02/14/13   Historical Provider, MD   BP 147/75 mmHg  Pulse 92  Temp(Src) 97.9 F (36.6 C) (Oral)  Resp 17  Ht '5\' 11"'$  (1.803 m)  Wt 125 lb (56.7 kg)  BMI 17.44 kg/m2  SpO2 97% Physical Exam  Constitutional: He is oriented to person, place, and time. He appears well-developed and well-nourished. No distress.  HENT:  Head: Normocephalic and atraumatic.  Right Ear: Hearing normal.  Left Ear: Hearing normal.  Nose: Nose normal.  Mouth/Throat: Oropharynx is clear and moist and mucous membranes are normal.  Eyes: Conjunctivae and EOM are normal. Pupils are equal, round, and reactive to light.  Neck: Normal range of motion. Neck supple.  Cardiovascular: Regular rhythm, S1 normal and S2 normal.  Exam reveals no gallop and no friction rub.   No murmur heard. Pulmonary/Chest: Effort normal. No respiratory distress. He has decreased breath sounds in the left middle field and the left lower field. He exhibits no tenderness.  Abdominal: Soft. Normal appearance and bowel sounds are normal. There is no hepatosplenomegaly. There is no tenderness. There is no rebound, no guarding, no tenderness at McBurney's point and negative Murphy's sign. No hernia.  Musculoskeletal: Normal range of motion.  Neurological: He is alert and oriented to person, place, and time. He has normal strength. No cranial nerve deficit or sensory deficit. Coordination  normal. GCS eye subscore is 4. GCS verbal subscore is 5. GCS motor subscore is 6.  Skin: Skin is warm, dry and intact. No rash noted. No cyanosis.  Psychiatric: He has a normal mood and affect. His speech is normal and behavior is normal. Thought content normal.  Nursing note and vitals reviewed.   ED Course  Procedures (including critical care time) Labs Review Labs Reviewed - No data to display  Imaging Review Dg Chest 2  View  01/25/2015   CLINICAL DATA:  Dry cough.  EXAM: CHEST  2 VIEW  COMPARISON:  12/30/2010  FINDINGS: Mediastinum is normal. Heart size normal. Right suprahilar infiltrate versus mass lesion noted. Prominent left pneumothorax with some degree of tension. Tiny nodular opacities projected over both lungs consistent with nipple shadows, similar findings noted on prior study . Left pleural effusion. No acute bony abnormality.  IMPRESSION: 1. Tension left pneumothorax. 2. Right suprahilar infiltrate versus mass. Critical Value/emergent results were called by telephone at the time of interpretation on 01/25/2015 at 2:03 pm to nurse Maudie Mercury , who verbally acknowledged these results.   Electronically Signed   By: Marcello Moores  Register   On: 01/25/2015 14:06   I have personally reviewed and evaluated these images and lab results as part of my medical decision-making.   EKG Interpretation   Date/Time:  Monday January 25 2015 14:44:27 EDT Ventricular Rate:  99 PR Interval:  154 QRS Duration: 76 QT Interval:  342 QTC Calculation: 438 R Axis:   91 Text Interpretation:  Normal sinus rhythm Possible Left atrial enlargement  Rightward axis Borderline ECG No previous tracing Confirmed by Rima Blizzard   MD, Thierno Hun [phone]) on 01/25/2015 3:38:36 PM      MDM   Final diagnoses:  Pneumothorax    Patient referred to the emergency department from radiology for pneumothorax. Patient is stable, no tension physiology on examination, although there apparently is some element of tension seen on the x-ray. No hypoxia, no hypotension. Consultation to CT surgery for further treatment.  Patient also informed of the possibility that he has a mass in his right lung. This will need to be followed up and further evaluated, likely as an outpatient after treatment for pneumothorax.    Orpah Greek, MD 01/25/15 [phone]

## 2015-01-26 ENCOUNTER — Inpatient Hospital Stay (HOSPITAL_COMMUNITY): Payer: Medicare Other

## 2015-01-26 ENCOUNTER — Encounter (HOSPITAL_COMMUNITY): Payer: Self-pay | Admitting: Thoracic Surgery (Cardiothoracic Vascular Surgery)

## 2015-01-26 LAB — CBC
HCT: 40 % (ref 39.0–52.0)
Hemoglobin: 13.5 g/dL (ref 13.0–17.0)
MCH: 29.8 pg (ref 26.0–34.0)
MCHC: 33.8 g/dL (ref 30.0–36.0)
MCV: 88.3 fL (ref 78.0–100.0)
Platelets: 219 10*3/uL (ref 150–400)
RBC: 4.53 MIL/uL (ref 4.22–5.81)
RDW: 14.9 % (ref 11.5–15.5)
WBC: 6.1 10*3/uL (ref 4.0–10.5)

## 2015-01-26 LAB — COMPREHENSIVE METABOLIC PANEL
ALT: 11 U/L — ABNORMAL LOW (ref 17–63)
AST: 21 U/L (ref 15–41)
Albumin: 3.5 g/dL (ref 3.5–5.0)
Alkaline Phosphatase: 52 U/L (ref 38–126)
Anion gap: 9 (ref 5–15)
BUN: 9 mg/dL (ref 6–20)
CO2: 28 mmol/L (ref 22–32)
Calcium: 9.1 mg/dL (ref 8.9–10.3)
Chloride: 101 mmol/L (ref 101–111)
Creatinine, Ser: 0.95 mg/dL (ref 0.61–1.24)
GFR calc Af Amer: >60 mL/min (ref >60)
GFR calc non Af Amer: >60 mL/min (ref >60)
Glucose, Bld: 108 mg/dL — ABNORMAL HIGH (ref 65–99)
Potassium: 3.7 mmol/L (ref 3.5–5.1)
Sodium: 138 mmol/L (ref 135–145)
Total Bilirubin: <0.1 mg/dL — ABNORMAL LOW (ref 0.3–1.2)
Total Protein: 7.2 g/dL (ref 6.5–8.1)

## 2015-01-26 LAB — TYPE AND SCREEN
ABO/RH(D): A POS
Antibody Screen: NEGATIVE

## 2015-01-26 LAB — PROTIME-INR
INR: 1.42 (ref 0.00–1.49)
Prothrombin Time: 17.5 seconds — ABNORMAL HIGH (ref 11.6–15.2)

## 2015-01-26 LAB — PLATELET INHIBITION P2Y12: Platelet Function  P2Y12: 258 [PRU] (ref 194–418)

## 2015-01-26 LAB — SURGICAL PCR SCREEN
MRSA, PCR: NEGATIVE
Staphylococcus aureus: NEGATIVE

## 2015-01-26 LAB — APTT: aPTT: 37 seconds (ref 24–37)

## 2015-01-26 MED ORDER — CHLORHEXIDINE GLUCONATE 4 % EX LIQD
60.0000 mL | Freq: Once | CUTANEOUS | Status: AC
  Administered 2015-01-27: 4 via TOPICAL
  Filled 2015-01-26: qty 60

## 2015-01-26 MED ORDER — DEXTROSE 5 % IV SOLN: 1.5000 g | INTRAVENOUS | Status: DC

## 2015-01-26 MED ORDER — GADOBENATE DIMEGLUMINE 529 MG/ML IV SOLN
10.0000 mL | Freq: Once | INTRAVENOUS | Status: AC | PRN
  Administered 2015-01-26: 10 mL via INTRAVENOUS

## 2015-01-26 MED ORDER — CHLORHEXIDINE GLUCONATE 4 % EX LIQD
60.0000 mL | Freq: Once | CUTANEOUS | Status: AC
  Administered 2015-01-26: 4 via TOPICAL
  Filled 2015-01-26: qty 60

## 2015-01-26 NOTE — ED Notes (Signed)
Pt being transported via stretcher w/telemetry to [facility].

## 2015-01-26 NOTE — Progress Notes (Signed)
UR Completed. Jaylon Grode, RN, BSN.  [phone] 

## 2015-01-26 NOTE — ED Notes (Signed)
MRI states it will be a while until this patient can go to MRI. Possibly several hours.

## 2015-01-26 NOTE — ED Notes (Signed)
Attempted to call report - was advised RN will need to return call for report.

## 2015-01-26 NOTE — Progress Notes (Signed)
Patient ID: Ivan Daniels, male   DOB: 1946-03-29, 69 y.o.   MRN: [id number]      [address]       [place] [address]             [phone]        Ivan Daniels [facility] Medical Record #[id number] Date of Birth: May 08, 1946  Referring: No ref. provider found Primary Care: [name], MD  Chief Complaint:    Chief Complaint  Patient presents with  . pneumothorax    . Cough    History of Present Illness:     Patient with wt lass for several months and increasing cough. Chest xray done as outpatient yesterdayshould left ptx and patient referred to the [facility] ER. A chest tube was placed on the left last pm, no air leak now. No hemoptysis. Cough has been productive of whitish sputum. He has not had fevers or chills.   The patient lives with his sister locally in [place]. He  has been treated for schizophrenia. He has had problems with long-standing tobacco abuse and intermittent polysubstance abuse.   Current Activity/ Functional Status: Patient is independent with mobility/ambulation, transfers, ADL's, IADL's.   Zubrod Score: At the time of surgery this patient's most appropriate activity status/level should be described as: '[]'$     0    Normal activity, no symptoms '[x]'$     1    Restricted in physical strenuous activity but ambulatory, able to do out light work '[]'$     2    Ambulatory and capable of self care, unable to do work activities, up and about                 more than 50%  Of the time                            '[]'$     3    Only limited self care, in bed greater than 50% of waking hours '[]'$     4    Completely disabled, no self care, confined to bed or chair '[]'$     5    Moribund  Past Medical History  Diagnosis Date  . Hyperlipidemia   . TOBACCO ABUSE 05/24/2009    Qualifier: Diagnosis of  By: Hassell Done FNP, Tori Milks    . Paranoid schizophrenia 10/31/2009    Qualifier: Diagnosis of  By: Jorene Minors, Scott    . BENIGN PROSTATIC HYPERTROPHY, HX OF  12/19/2006    Qualifier: Diagnosis of  By: Radene Ou MD, [place]    . ALCOHOL ABUSE 11/12/2009    Qualifier: Diagnosis of  By: Hassell Done FNP, Tori Milks    . Primary spontaneous pneumothorax, left 01/25/2015  . Lung mass 01/25/2015    3.7 x 3.6 cm RUL spiculated lung mass with 2.0 cm right paratracheal lymph node suspicious for bronchogenic CA    History reviewed. No pertinent past surgical history.  History  Smoking status  . Current Every Day Smoker -- 0.50 packs/day for 30 years  . Types: Cigarettes  Smokeless tobacco  . Not on file    History  Alcohol Use: Not on file    Social History   Social History  . Marital Status: Single    Spouse Name: N/A  . Number of Children: N/A  . Years of Education: N/A   Occupational History  . Not on file.   Social History Main Topics  . Smoking status: Current  Every Day Smoker -- 0.50 packs/day for 30 years    Types: Cigarettes  . Smokeless tobacco: Not on file  . Alcohol Use: Not on file  . Drug Use: Not on file  . Sexual Activity: Not on file   Other Topics Concern  . Not on file   Social History Narrative    Allergies  Allergen Reactions  . Benztropine Mesylate     REACTION: decrease ROM neck  . Chlorpromazine Hcl     Current Facility-Administered Medications  Medication Dose Route Frequency Provider Last Rate Last Dose  . acetaminophen (TYLENOL) tablet 650 mg  650 mg Oral Q6H PRN Rexene Alberts, MD      . aspirin EC tablet 81 mg  81 mg Oral Daily Rexene Alberts, MD   81 mg at 01/26/15 0706  . benztropine (COGENTIN) tablet 0.5 mg  0.5 mg Oral QHS Rexene Alberts, MD   0.5 mg at 01/25/15 2245  . docusate sodium (COLACE) capsule 100 mg  100 mg Oral BID Rexene Alberts, MD   100 mg at 01/26/15 1053  . haloperidol (HALDOL) tablet 5 mg  5 mg Oral QHS Rexene Alberts, MD   5 mg at 01/25/15 2246  . levofloxacin (LEVAQUIN) tablet 500 mg  500 mg Oral Daily Rexene Alberts, MD   500 mg at 01/26/15 1053  . morphine 2 MG/ML injection 1  mg  1 mg Intravenous Q1H PRN Rexene Alberts, MD      . multivitamin with minerals tablet 1 tablet  1 tablet Oral Daily Rexene Alberts, MD   1 tablet at 01/26/15 1053  . oxyCODONE (Oxy IR/ROXICODONE) immediate release tablet 10 mg  10 mg Oral Q3H PRN Rexene Alberts, MD      . oxyCODONE (Oxy IR/ROXICODONE) immediate release tablet 5 mg  5 mg Oral Q3H PRN Rexene Alberts, MD      . simvastatin (ZOCOR) tablet 40 mg  40 mg Oral q1800 Rexene Alberts, MD   40 mg at 01/25/15 2310    Prescriptions prior to admission  Medication Sig Dispense Refill Last Dose  . aspirin EC 81 MG tablet Take 81 mg by mouth daily.   01/25/2015 at Unknown time  . benztropine (COGENTIN) 0.5 MG tablet Take 0.5 mg by mouth at bedtime.    01/24/2015 at Unknown time  . ferrous fumarate (HEMOCYTE - 106 MG FE) 325 (106 FE) MG TABS tablet Take 1 tablet by mouth.   01/25/2015 at Unknown time  . haloperidol (HALDOL) 5 MG tablet Take 5 mg by mouth at bedtime.    01/24/2015 at Unknown time  . levofloxacin (LEVAQUIN) 500 MG tablet Take 500 mg by mouth daily.  0 01/25/2015 at Unknown time  . Multiple Vitamins-Minerals (MULTIVITAMIN WITH MINERALS) tablet Take 1 tablet by mouth daily.   01/25/2015 at Unknown time  . simvastatin (ZOCOR) 20 MG tablet    01/24/2015 at Unknown time  . aspirin 120 MG suppository Place 120 mg rectally every 6 (six) hours as needed for fever.   Taking  . clopidogrel (PLAVIX) 75 MG tablet    Taking    History reviewed. No pertinent family history.   Review of Systems:      Cardiac Review of Systems: Y or N  Chest Pain [ y   ]  Resting SOB [ y  ] Exertional SOB  Blue.Reese  ]  Orthopnea [ n ]   Pedal Edema [ n]    Palpitations [  n ] Syncope  [ n ]   Presyncope [ n  ]  General Review of Systems: [Y] = yes [  ]=no Constitional: recent weight change Blue.Reese  ]; anorexia Blue.Reese ]; fatigue Blue.Reese  ]; nausea [  ]; night sweats [  ]; fever [  ]; or chills [  ]                                                               Dental: poor  dentition[  ]; Last Dentist visit:   Eye : blurred vision [  ]; diplopia [   ]; vision changes [  ];  Amaurosis fugax[  ]; Resp: cough [  ];  wheezing[  ];  hemoptysis[n ]; shortness of breath[y ]; paroxysmal nocturnal dyspnea[  ]; dyspnea on exertion[  ]; or orthopnea[  ];  GI:  gallstones[  ], vomiting[  ];  dysphagia[  ]; melena[  ];  hematochezia [  ]; heartburn[  ];   Hx of  Colonoscopy[  ]; GU: kidney stones [  ]; hematuria[  ];   dysuria [  ];  nocturia[  ];  history of     obstruction [  ]; urinary frequency [  ]             Skin: rash, swelling[  ];, hair loss[  ];  peripheral edema[  ];  or itching[  ]; Musculosketetal: myalgias[  ];  joint swelling[  ];  joint erythema[  ];  joint pain[  ];  back pain[  ];  Heme/Lymph: bruising[  ];  bleeding[  ];  anemia[  ];  Neuro: TIA[  ];  headaches[  ];  stroke[  ];  vertigo[  ];  seizures[  ];   paresthesias[  ];  difficulty walking[ n ];  Psych:depression[  ]; anxiety[  ];  Endocrine: diabetes[  ];  thyroid dysfunction[  ];  Immunizations: Flu [ n ]; Pneumococcal[ n ];  Other:  Physical Exam: BP 149/74 mmHg  Pulse 81  Temp(Src) 99 F (37.2 C) (Oral)  Resp 19  Ht '5\' 11"'$  (1.803 m)  Wt 129 lb (58.514 kg)  BMI 18.00 kg/m2  SpO2 100%   General appearance: alert, cooperative and appears older than stated age Head: Normocephalic, without obvious abnormality, atraumatic Neck: no adenopathy, no carotid bruit, no JVD, supple, symmetrical, trachea midline and thyroid not enlarged, symmetric, no tenderness/mass/nodules Lymph nodes: Cervical, supraclavicular, and axillary nodes normal. Resp: diminished breath sounds bibasilar Back: symmetric, no curvature. ROM normal. No CVA tenderness. Cardio: regular rate and rhythm, S1, S2 normal, no murmur, click, rub or gallop GI: soft, non-tender; bowel sounds normal; no masses,  no organomegaly Extremities: feet warm, +1 dp and pt pulses bil, bilaterial toe amputations Neurologic: Grossly  normal  Diagnostic Studies & Laboratory data:     Recent Radiology Findings:   Dg Chest 2 View  01/25/2015   CLINICAL DATA:  Dry cough.  EXAM: CHEST  2 VIEW  COMPARISON:  12/30/2010  FINDINGS: Mediastinum is normal. Heart size normal. Right suprahilar infiltrate versus mass lesion noted. Prominent left pneumothorax with some degree of tension. Tiny nodular opacities projected over both lungs consistent with nipple shadows, similar findings noted on prior study . Left pleural effusion. No acute bony abnormality.  IMPRESSION: 1. Tension left pneumothorax. 2. Right suprahilar infiltrate versus mass. Critical Value/emergent results were called by telephone at the time of interpretation on 01/25/2015 at 2:03 pm to nurse Maudie Mercury , who verbally acknowledged these results.   Electronically Signed   By: Marcello Moores  Register   On: 01/25/2015 14:06   Ct Chest W Contrast  01/25/2015   CLINICAL DATA:  Status post chest tube for tension pneumothorax  EXAM: CT CHEST WITH CONTRAST  TECHNIQUE: Multidetector CT imaging of the chest was performed during intravenous contrast administration.  CONTRAST:  29m OMNIPAQUE IOHEXOL 300 MG/ML  SOLN  COMPARISON:  Chest radiographs dated 01/25/2015  FINDINGS: Mediastinum/Nodes: Heart is normal in size.  Mild coronary atherosclerosis.  2.0 cm short axis low right paratracheal node (series 2/ image 24). 12 mm short axis left paratracheal node (series 2/image 24).  Visualized thyroid is unremarkable.  Lungs/Pleura: 3.7 x 3.6 cm spiculated medial right upper lobe mass radiating into the hilar region (series 3/ image 25).  Additional 6 mm nodular opacity in the right lung apex (series 3/image 11).  Underlying moderate paraseptal emphysematous changes.  Subpleural reticulation/fibrosis, predominantly in the left lower lobe and lingula (series 3/image 46).  Near complete resolution of prior left pneumothorax status post placement of left chest tube. Trace residual pleural gas along the left fissure  inferiorly (series 3/image 50), of no consequence.  Upper abdomen: Visualized upper abdomen is notable for tiny probable hepatic cysts measuring up to 5 mm (series 2/ image 55), although technically too small to characterize.  Musculoskeletal: No focal osseous lesions.  Subcutaneous gas along the left anterior chest wall.  IMPRESSION: Prior left pneumothorax has essentially resolved status post placement of left chest tube.  3.7 x 3.6 cm spiculated medial right upper lobe mass extending into the hilar region, suspicious for primary bronchogenic neoplasm.  Associated paratracheal nodal metastases, as above.   Electronically Signed   By: SJulian HyM.D.   On: 01/25/2015 23:02   Dg Chest Port 1 View  01/26/2015   CLINICAL DATA:  Atelectasis, spontaneous pneumothorax status post left chest tube, lung mass  EXAM: PORTABLE CHEST - 1 VIEW  COMPARISON:  CT chest dated 01/25/2015  FINDINGS: Left chest tube without visualized pneumothorax.  Medial right upper lobe/hilar mass, better evaluated on CT.  The heart is normal in size.  IMPRESSION: Left chest tube without visualized pneumothorax.  Medial right upper lobe/hilar mass, better visualized on CT.   Electronically Signed   By: SJulian HyM.D.   On: 01/26/2015 05:04   Dg Chest Port 1 View  01/25/2015   CLINICAL DATA:  Left-sided chest tube inserted. Check tube position.  EXAM: PORTABLE CHEST - 1 VIEW  COMPARISON:  Nineteen 2016  FINDINGS: Left-sided chest tube has been placed. There has been near complete evacuation of left pneumothorax. Small left basilar pneumothorax persists. The lungs are hyperinflated. There is perihilar peribronchial thickening. Prominence of the right hilar region is again noted and warrants follow-up.  IMPRESSION: 1. Interval near complete evacuation of left pneumothorax following tube placement. 2. Persistent right hilar prominence.   Electronically Signed   By: ENolon NationsM.D.   On: 01/25/2015 18:42     I have  independently reviewed the above radiologic studies.  Recent Lab Findings: Lab Results  Component Value Date   WBC 5.5 01/25/2015   HGB 14.1 01/25/2015   HCT 42.3 01/25/2015   PLT 180 01/25/2015   GLUCOSE 98 01/25/2015   CHOL 142 01/26/2009   TRIG  74 01/26/2009   HDL 80 01/26/2009   LDLCALC 47 01/26/2009   ALT 13 01/26/2009   AST 24 01/26/2009   NA 137 01/25/2015   K 4.3 01/25/2015   CL 103 01/25/2015   CREATININE 1.09 01/25/2015   BUN 9 01/25/2015   CO2 25 01/25/2015   TSH 2.632 11/11/2007      Assessment / Plan:     Clinicial stage IIIa /B lung cancinoma, MRI of brain pending I have recommended to patient and family to proceed with bronchoscopy,EBUS poss mediastinoscopy to obtain tissue dx. PET scan can be done as outpatient. AFter bx then can dx chest tube on the left if no air leak. Discussed with Dr Roxy Manns The goals risks and alternatives of the planned surgical procedure  Bronchoscopy,EBUS with biopsy  poss mediastinoscopy  have been discussed with the patient in detail. The risks of the procedure including death, infection, stroke, myocardial infarction, bleeding, blood transfusion have all been discussed specifically.  I have quoted Princess Bruins Oettinger a 2 % of perioperative mortality and a complication rate as high as 10 %. The patient's questions have been answered.Princess Bruins Mcewan is willing  to proceed with the planned procedure.   I  spent 30 minutes counseling the patient face to face and 50% or more the  time was spent in counseling and coordination of care. The total time spent in the appointment was 40 minutes.    Grace Isaac MD      [address] [place] [address] Office [phone]   Beeper [phone]  01/26/2015 1:28 PM

## 2015-01-26 NOTE — ED Notes (Signed)
Family at bedside. 

## 2015-01-27 ENCOUNTER — Inpatient Hospital Stay (HOSPITAL_COMMUNITY): Payer: Medicare Other | Admitting: Certified Registered"

## 2015-01-27 ENCOUNTER — Encounter (HOSPITAL_COMMUNITY)
Admission: EM | Disposition: A | Discharge status: Home / Self Care | Payer: Self-pay | Source: Home / Self Care | Attending: Thoracic Surgery (Cardiothoracic Vascular Surgery)

## 2015-01-27 ENCOUNTER — Encounter (HOSPITAL_COMMUNITY): Payer: Self-pay | Admitting: Certified Registered"

## 2015-01-27 DIAGNOSIS — C3411 Malignant neoplasm of upper lobe, right bronchus or lung: Secondary | ICD-10-CM

## 2015-01-27 DIAGNOSIS — C3490 Malignant neoplasm of unspecified part of unspecified bronchus or lung: Secondary | ICD-10-CM

## 2015-01-27 HISTORY — PX: LUNG BIOPSY: SHX5088

## 2015-01-27 HISTORY — DX: Malignant neoplasm of unspecified part of unspecified bronchus or lung: C34.90

## 2015-01-27 HISTORY — PX: VIDEO BRONCHOSCOPY WITH ENDOBRONCHIAL ULTRASOUND: SHX6177

## 2015-01-27 SURGERY — BRONCHOSCOPY, WITH EBUS
Anesthesia: General | Laterality: Right

## 2015-01-27 MED ORDER — PHENYLEPHRINE HCL 10 MG/ML IJ SOLN
10.0000 mg | INTRAVENOUS | Status: DC | PRN
  Administered 2015-01-27: 50 ug/min via INTRAVENOUS

## 2015-01-27 MED ORDER — OXYCODONE HCL 5 MG/5ML PO SOLN: 5.0000 mg | Freq: Once | ORAL | Status: DC | PRN

## 2015-01-27 MED ORDER — GLYCOPYRROLATE 0.2 MG/ML IJ SOLN
INTRAMUSCULAR | Status: AC
  Filled 2015-01-27: qty 2

## 2015-01-27 MED ORDER — INFLUENZA VAC SPLIT QUAD 0.5 ML IM SUSY
0.5000 mL | PREFILLED_SYRINGE | INTRAMUSCULAR | Status: DC
  Filled 2015-01-27: qty 0.5

## 2015-01-27 MED ORDER — LACTATED RINGERS IV SOLN
INTRAVENOUS | Status: DC
  Administered 2015-01-27: 11:00:00 via INTRAVENOUS

## 2015-01-27 MED ORDER — LIDOCAINE HCL (CARDIAC) 20 MG/ML IV SOLN
INTRAVENOUS | Status: AC
  Filled 2015-01-27: qty 5

## 2015-01-27 MED ORDER — ROCURONIUM BROMIDE 100 MG/10ML IV SOLN
INTRAVENOUS | Status: DC | PRN
  Administered 2015-01-27: 40 mg via INTRAVENOUS

## 2015-01-27 MED ORDER — MIDAZOLAM HCL 5 MG/5ML IJ SOLN
INTRAMUSCULAR | Status: DC | PRN
  Administered 2015-01-27: 2 mg via INTRAVENOUS

## 2015-01-27 MED ORDER — SUFENTANIL CITRATE 50 MCG/ML IV SOLN
INTRAVENOUS | Status: AC
  Filled 2015-01-27: qty 1

## 2015-01-27 MED ORDER — SUFENTANIL CITRATE 50 MCG/ML IV SOLN
INTRAVENOUS | Status: DC | PRN
  Administered 2015-01-27: 10 ug via INTRAVENOUS

## 2015-01-27 MED ORDER — GLYCOPYRROLATE 0.2 MG/ML IJ SOLN
INTRAMUSCULAR | Status: DC | PRN
  Administered 2015-01-27: 0.4 mg via INTRAVENOUS

## 2015-01-27 MED ORDER — LIDOCAINE HCL (CARDIAC) 20 MG/ML IV SOLN
INTRAVENOUS | Status: DC | PRN
  Administered 2015-01-27: 50 mg via INTRAVENOUS

## 2015-01-27 MED ORDER — 0.9 % SODIUM CHLORIDE (POUR BTL) OPTIME
TOPICAL | Status: DC | PRN
  Administered 2015-01-27: 1000 mL

## 2015-01-27 MED ORDER — NEOSTIGMINE METHYLSULFATE 10 MG/10ML IV SOLN
INTRAVENOUS | Status: AC
  Filled 2015-01-27: qty 1

## 2015-01-27 MED ORDER — FENTANYL CITRATE (PF) 100 MCG/2ML IJ SOLN: 25.0000 ug | INTRAMUSCULAR | Status: DC | PRN

## 2015-01-27 MED ORDER — SODIUM CHLORIDE 0.9 % IR SOLN
Status: DC | PRN
  Administered 2015-01-27: 250 mL

## 2015-01-27 MED ORDER — PHENYLEPHRINE HCL 10 MG/ML IJ SOLN
INTRAMUSCULAR | Status: DC | PRN
  Administered 2015-01-27: 120 ug via INTRAVENOUS
  Administered 2015-01-27: 80 ug via INTRAVENOUS
  Administered 2015-01-27: 120 ug via INTRAVENOUS
  Administered 2015-01-27: 80 ug via INTRAVENOUS

## 2015-01-27 MED ORDER — NEOSTIGMINE METHYLSULFATE 10 MG/10ML IV SOLN
INTRAVENOUS | Status: DC | PRN
  Administered 2015-01-27: 3 mg via INTRAVENOUS

## 2015-01-27 MED ORDER — ONDANSETRON HCL 4 MG/2ML IJ SOLN: 4.0000 mg | Freq: Once | INTRAMUSCULAR | Status: DC | PRN

## 2015-01-27 MED ORDER — PROPOFOL 10 MG/ML IV BOLUS
INTRAVENOUS | Status: AC
  Filled 2015-01-27: qty 20

## 2015-01-27 MED ORDER — MIDAZOLAM HCL 2 MG/2ML IJ SOLN
INTRAMUSCULAR | Status: AC
  Filled 2015-01-27: qty 4

## 2015-01-27 MED ORDER — ONDANSETRON HCL 4 MG/2ML IJ SOLN
INTRAMUSCULAR | Status: AC
  Filled 2015-01-27: qty 2

## 2015-01-27 MED ORDER — ENOXAPARIN SODIUM 30 MG/0.3ML ~~LOC~~ SOLN
30.0000 mg | SUBCUTANEOUS | Status: DC
  Administered 2015-01-27 – 2015-01-28 (×2): 30 mg via SUBCUTANEOUS
  Filled 2015-01-27 (×2): qty 0.3

## 2015-01-27 MED ORDER — PHENYLEPHRINE 40 MCG/ML (10ML) SYRINGE FOR IV PUSH (FOR BLOOD PRESSURE SUPPORT)
PREFILLED_SYRINGE | INTRAVENOUS | Status: AC
  Filled 2015-01-27: qty 10

## 2015-01-27 MED ORDER — PROPOFOL 10 MG/ML IV BOLUS
INTRAVENOUS | Status: DC | PRN
Start: 2015-01-27 — End: 2015-01-27
  Administered 2015-01-27: 140 mg via INTRAVENOUS

## 2015-01-27 MED ORDER — DEXTROSE 5 % IV SOLN
INTRAVENOUS | Status: AC
  Administered 2015-01-27: 1.5 g via INTRAVENOUS
  Filled 2015-01-27: qty 1.5

## 2015-01-27 MED ORDER — ONDANSETRON HCL 4 MG/2ML IJ SOLN
INTRAMUSCULAR | Status: DC | PRN
  Administered 2015-01-27: 4 mg via INTRAVENOUS

## 2015-01-27 MED ORDER — PHENYLEPHRINE HCL 10 MG/ML IJ SOLN
INTRAMUSCULAR | Status: AC
  Filled 2015-01-27: qty 1

## 2015-01-27 MED ORDER — EPINEPHRINE HCL 1 MG/ML IJ SOLN
INTRAMUSCULAR | Status: AC
  Filled 2015-01-27: qty 1

## 2015-01-27 MED ORDER — OXYCODONE HCL 5 MG PO TABS: 5.0000 mg | ORAL_TABLET | Freq: Once | ORAL | Status: DC | PRN

## 2015-01-27 SURGICAL SUPPLY — 49 items
BLADE SURG 10 STRL SS (BLADE) IMPLANT
BRUSH CYTOL CELLEBRITY 1.5X140 (MISCELLANEOUS) ×3 IMPLANT
CANISTER SUCTION 2500CC (MISCELLANEOUS) ×6 IMPLANT
CLIP TI MEDIUM 6 (CLIP) IMPLANT
CONT SPEC 4OZ CLIKSEAL STRL BL (MISCELLANEOUS) ×9 IMPLANT
COVER DOME SNAP 22 D (MISCELLANEOUS) IMPLANT
COVER SURGICAL LIGHT HANDLE (MISCELLANEOUS) IMPLANT
COVER TABLE BACK 60X90 (DRAPES) ×3 IMPLANT
DERMABOND ADVANCED (GAUZE/BANDAGES/DRESSINGS) ×1
DERMABOND ADVANCED .7 DNX12 (GAUZE/BANDAGES/DRESSINGS) ×2 IMPLANT
DRAPE LAPAROTOMY T 102X78X121 (DRAPES) IMPLANT
DRSG AQUACEL AG ADV 3.5X14 (GAUZE/BANDAGES/DRESSINGS) IMPLANT
ELECT CAUTERY BLADE 6.4 (BLADE) IMPLANT
ELECT REM PT RETURN 9FT ADLT (ELECTROSURGICAL)
ELECTRODE REM PT RTRN 9FT ADLT (ELECTROSURGICAL) IMPLANT
FORCEPS BIOP RJ4 1.8 (CUTTING FORCEPS) ×3 IMPLANT
GAUZE SPONGE 4X4 12PLY STRL (GAUZE/BANDAGES/DRESSINGS) ×6 IMPLANT
GAUZE SPONGE 4X4 16PLY XRAY LF (GAUZE/BANDAGES/DRESSINGS) ×3 IMPLANT
GLOVE BIO SURGEON STRL SZ 6.5 (GLOVE) ×6 IMPLANT
GOWN STRL REUS W/ TWL LRG LVL3 (GOWN DISPOSABLE) ×4 IMPLANT
GOWN STRL REUS W/TWL LRG LVL3 (GOWN DISPOSABLE) ×2
HEMOSTAT SURGICEL 2X14 (HEMOSTASIS) IMPLANT
KIT BASIN OR (CUSTOM PROCEDURE TRAY) ×3 IMPLANT
KIT CLEAN ENDO COMPLIANCE (KITS) ×6 IMPLANT
KIT ROOM TURNOVER OR (KITS) ×3 IMPLANT
MARKER SKIN DUAL TIP RULER LAB (MISCELLANEOUS) ×3 IMPLANT
NEEDLE BIOPSY TRANSBRONCH 21G (NEEDLE) IMPLANT
NEEDLE BLUNT 18X1 FOR OR ONLY (NEEDLE) IMPLANT
NEEDLE SONO TIP II EBUS (NEEDLE) ×3 IMPLANT
NS IRRIG 1000ML POUR BTL (IV SOLUTION) ×6 IMPLANT
OIL SILICONE PENTAX (PARTS (SERVICE/REPAIRS)) ×3 IMPLANT
PACK SURGICAL SETUP 50X90 (CUSTOM PROCEDURE TRAY) IMPLANT
PAD ARMBOARD 7.5X6 YLW CONV (MISCELLANEOUS) ×12 IMPLANT
PENCIL BUTTON HOLSTER BLD 10FT (ELECTRODE) IMPLANT
SPONGE INTESTINAL PEANUT (DISPOSABLE) IMPLANT
STAPLER VISISTAT 35W (STAPLE) IMPLANT
SUT VIC AB 3-0 SH 18 (SUTURE) IMPLANT
SUT VICRYL 4-0 PS2 18IN ABS (SUTURE) IMPLANT
SWAB COLLECTION DEVICE MRSA (MISCELLANEOUS) IMPLANT
SYR 20CC LL (SYRINGE) ×3 IMPLANT
SYR 20ML ECCENTRIC (SYRINGE) ×6 IMPLANT
SYRINGE 10CC LL (SYRINGE) ×3 IMPLANT
TOWEL OR 17X24 6PK STRL BLUE (TOWEL DISPOSABLE) ×6 IMPLANT
TOWEL OR 17X26 10 PK STRL BLUE (TOWEL DISPOSABLE) ×3 IMPLANT
TRAP SPECIMEN MUCOUS 40CC (MISCELLANEOUS) ×3 IMPLANT
TUBE ANAEROBIC SPECIMEN COL (MISCELLANEOUS) IMPLANT
TUBE CONNECTING 12X1/4 (SUCTIONS) ×3 IMPLANT
TUBE CONNECTING 20X1/4 (TUBING) ×3 IMPLANT
WATER STERILE IRR 1000ML POUR (IV SOLUTION) ×3 IMPLANT

## 2015-01-27 NOTE — Anesthesia Postprocedure Evaluation (Signed)
  Anesthesia Post-op Note  Patient: Ivan Daniels  Procedure(s) Performed: Procedure(s): VIDEO BRONCHOSCOPY WITH ENDOBRONCHIAL ULTRASOUND (N/A) Right Upper Lobe Bronchus BIOPSY (Right)  Patient Location: PACU  Anesthesia Type:General  Level of Consciousness: awake, alert  and oriented  Airway and Oxygen Therapy: Patient Spontanous Breathing and Patient connected to nasal cannula oxygen  Post-op Pain: mild  Post-op Assessment: Post-op Vital signs reviewed, Patient's Cardiovascular Status Stable, Respiratory Function Stable, Patent Airway and Pain level controlled              Post-op Vital Signs: stable  Last Vitals:  Filed Vitals:   01/27/15 1427  BP: 118/61  Pulse: 85  Temp: 36.9 C  Resp: 18    Complications: No apparent anesthesia complications

## 2015-01-27 NOTE — Brief Op Note (Signed)
      [address]       [place] [address]             [phone]    01/27/2015  12:14 PM  PATIENT:  Ivan Daniels  69 y.o. male  PRE-OPERATIVE DIAGNOSIS:  Right Lung mass and mediastinal adenopathy  POST-OPERATIVE DIAGNOSIS: non small cell lung cancer stage IIIb PROCEDURE:  Procedure(s): VIDEO BRONCHOSCOPY WITH ENDOBRONCHIAL ULTRASOUND (N/A) Right Upper Lobe Bronchus BIOPSY (Right) Transbronchial biopsy of #7 node  SURGEON:  Surgeon(s) and Role:    * Grace Isaac, MD - Primary   ANESTHESIA:   general  EBL:  Total I/O In: 0  Out: 200 [Urine:200]  BLOOD ADMINISTERED:none  DRAINS: none   LOCAL MEDICATIONS USED:  NONE  SPECIMEN:  Source of Specimen:  # 7 node and right upper lobe bronchus  DISPOSITION OF SPECIMEN:  PATHOLOGY  COUNTS:  YES  DICTATION: .Dragon Dictation  PLAN OF CARE: patient already inpatient  PATIENT DISPOSITION:  PACU - hemodynamically stable.   Delay start of Pharmacological VTE agent (>24hrs) due to surgical blood loss or risk of bleeding: yes

## 2015-01-27 NOTE — Anesthesia Preprocedure Evaluation (Signed)
Anesthesia Evaluation  Patient identified by MRN, date of birth, ID band Patient awake    Reviewed: Allergy & Precautions, NPO status , Patient's Chart, lab work & pertinent test results  Airway Mallampati: II  TM Distance: >3 FB Neck ROM: Full    Dental  (+) Edentulous Upper, Edentulous Lower   Pulmonary Current Smoker,    breath sounds clear to auscultation       Cardiovascular  Rhythm:Regular Rate:Normal     Neuro/Psych    GI/Hepatic   Endo/Other    Renal/GU      Musculoskeletal   Abdominal   Peds  Hematology   Anesthesia Other Findings   Reproductive/Obstetrics                             Anesthesia Physical Anesthesia Plan  ASA: III  Anesthesia Plan: General   Post-op Pain Management:    Induction: Intravenous  Airway Management Planned: Oral ETT  Additional Equipment:   Intra-op Plan:   Post-operative Plan:   Informed Consent: I have reviewed the patients History and Physical, chart, labs and discussed the procedure including the risks, benefits and alternatives for the proposed anesthesia with the patient or authorized representative who has indicated his/her understanding and acceptance.     Plan Discussed with: CRNA and Anesthesiologist  Anesthesia Plan Comments:         Anesthesia Quick Evaluation

## 2015-01-27 NOTE — Anesthesia Procedure Notes (Signed)
Procedure Name: Intubation Date/Time: 01/27/2015 11:16 AM Performed by: Melina Copa, Loryn Haacke R Pre-anesthesia Checklist: Patient identified, Emergency Drugs available, Suction available, Patient being monitored and Timeout performed Patient Re-evaluated:Patient Re-evaluated prior to inductionOxygen Delivery Method: Circle system utilized Intubation Type: IV induction Ventilation: Mask ventilation without difficulty Laryngoscope Size: Mac and 4 Grade View: Grade I Tube type: Oral Tube size: 9.0 mm Number of attempts: 1 Airway Equipment and Method: Stylet Placement Confirmation: ETT inserted through vocal cords under direct vision,  positive ETCO2 and breath sounds checked- equal and bilateral Secured at: 20 cm Tube secured with: Tape Dental Injury: Teeth and Oropharynx as per pre-operative assessment

## 2015-01-27 NOTE — Progress Notes (Signed)
      [address]       [place] [address]             [phone]         Procedure(s) (LRB): VIDEO BRONCHOSCOPY WITH ENDOBRONCHIAL ULTRASOUND (N/A) MEDIASTINOSCOPY (N/A) LUNG BIOPSY (N/A)  Subjective: Patient with pain at left chest tube site.  Objective: Vital signs in last 24 hours: Temp:  [98.6 F (37 C)-99 F (37.2 C)] 98.6 F (37 C) (09/21 0503) Pulse Rate:  [81-91] 86 (09/21 0503) Cardiac Rhythm:  [-] Normal sinus rhythm (09/21 0719) Resp:  [18-19] 18 (09/21 0503) BP: (133-161)/(68-74) 133/72 mmHg (09/21 0503) SpO2:  [95 %-100 %] 99 % (09/21 0503) Weight:  [129 lb (58.514 kg)] 129 lb (58.514 kg) (09/20 1219)     Intake/Output from previous day: 09/20 0701 - 09/21 0700 In: 596 [P.O.:596] Out: 1825 [Urine:1825]   Physical Exam:  Cardiovascular: RRR Pulmonary: Clear to auscultation bilaterally; no rales, wheezes, or rhonchi. Wounds: Dressing is clean and dry.   Chest Tube: to suction and no air leak  Lab Results: CBC: Recent Labs  01/25/15 1820 01/26/15 1454  WBC 5.5 6.1  HGB 14.1 13.5  HCT 42.3 40.0  PLT 180 219   BMET:  Recent Labs  01/25/15 1820 01/26/15 1454  NA 137 138  K 4.3 3.7  CL 103 101  CO2 25 28  GLUCOSE 98 108*  BUN 9 9  CREATININE 1.09 0.95  CALCIUM 9.1 9.1    PT/INR:  Recent Labs  01/26/15 1454  LABPROT 17.5*  INR 1.42   ABG:  INR: Will add last result for INR, ABG once components are confirmed Will add last 4 CBG results once components are confirmed  Assessment/Plan:  1. CV - SR in the 80's. 2.  Pulmonary - Chest tube is to suction. There is no air leak.Patient with stage IIIa/B lung cancer.  3. MRI brain showed no acute intracranial process or metastasis. 4. Patient to undergo EBUS/lung biopsy later today  Misael Mcgaha MPA-C 01/27/2015,9:18 AM

## 2015-01-27 NOTE — Transfer of Care (Signed)
Immediate Anesthesia Transfer of Care Note  Patient: Ivan Daniels  Procedure(s) Performed: Procedure(s): VIDEO BRONCHOSCOPY WITH ENDOBRONCHIAL ULTRASOUND (N/A) Right Upper Lobe Bronchus BIOPSY (Right)  Patient Location: PACU  Anesthesia Type:General  Level of Consciousness: sedated  Airway & Oxygen Therapy: Patient Spontanous Breathing and Patient connected to nasal cannula oxygen  Post-op Assessment: Report given to RN, Post -op Vital signs reviewed and stable and Patient moving all extremities  Post vital signs: Reviewed and stable  Last Vitals:  Filed Vitals:   01/27/15 1224  BP:   Pulse:   Temp: 36.8 C  Resp:     Complications: No apparent anesthesia complications

## 2015-01-28 ENCOUNTER — Ambulatory Visit: Payer: Medicare Other | Admitting: Podiatry

## 2015-01-28 ENCOUNTER — Encounter (HOSPITAL_COMMUNITY): Payer: Self-pay | Admitting: Cardiothoracic Surgery

## 2015-01-28 ENCOUNTER — Inpatient Hospital Stay (HOSPITAL_COMMUNITY): Payer: Medicare Other

## 2015-01-28 MED ORDER — GUAIFENESIN 100 MG/5ML PO SOLN
5.0000 mL | ORAL | Status: DC | PRN
  Administered 2015-01-28: 100 mg via ORAL
  Filled 2015-01-28: qty 5

## 2015-01-28 NOTE — Discharge Summary (Signed)
Physician Discharge Summary  Patient ID: Ivan Daniels MRN: [id number] DOB/AGE: 1945-05-21 69 y.o.  Admit date: 01/25/2015 Discharge date: 01/29/2015  Admission Diagnoses:  Patient Active Problem List   Diagnosis Date Noted  . Primary spontaneous pneumothorax, left 01/25/2015  . Lung mass 01/25/2015  . KERATOSIS 04/12/2010  . 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 ALCOHOL ABUSE 11/12/2009  . Paranoid schizophrenia 10/31/2009  . TOBACCO ABUSE 05/24/2009  . EPISTAXIS 11/19/2008  . 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 UNSPECIFIED ANEMIA 11/12/2007  . ATHEROSCLEROSIS, CEREBRAL 02/18/2007  . WEIGHT LOSS, ABNORMAL 02/18/2007  . DYSLIPIDEMIA 12/19/2006  . 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 CAROTID ARTERY STENOSIS, LEFT 12/19/2006  . BENIGN PROSTATIC HYPERTROPHY, HX OF 12/19/2006   Discharge Diagnoses:   Patient Active Problem List   Diagnosis Date Noted  . Primary spontaneous pneumothorax, left 01/25/2015  . Lung mass 01/25/2015  . KERATOSIS 04/12/2010  . 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 ALCOHOL ABUSE 11/12/2009  . Paranoid schizophrenia 10/31/2009  . TOBACCO ABUSE 05/24/2009  . EPISTAXIS 11/19/2008  . 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 UNSPECIFIED ANEMIA 11/12/2007  . ATHEROSCLEROSIS, CEREBRAL 02/18/2007  . WEIGHT LOSS, ABNORMAL 02/18/2007  . DYSLIPIDEMIA 12/19/2006  . 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 CAROTID ARTERY STENOSIS, LEFT 12/19/2006  . BENIGN PROSTATIC HYPERTROPHY, HX OF 12/19/2006   Discharged Condition: good  History of Present Illness:  Mr. Ivan Daniels is a 69 yo African American Male who present with complaint of 1-2 week history of persistent cough and respiratory congestion.  He was evaluated by his PCP who got a CXR which showed a large left sided pneumothorax and he was sent to the ED.  TCTS consult was obtained and review of CXR showed a large opacity on the right side.  It was also felt chest tube placement would  be indicated.  Hospital Course:   He was admitted by Dr. Roxy Daniels.  He performed bedside chest tube placement with successful evacuation of pneumothorax.  CT Scan was obtained to further investigate Right lung opacity.  This showed a 3.7 x 3.6 cm spiculate medial right upper lobe mass with extension into the hilar region.  The was also associated evidence of nodal metastasis.  The patient was then evaluated by Dr. Servando Daniels who felt he should undergo an EBUS for further diagnostic purposes.  This was done on 01/27/2015.  The patient tolerated the procedure without difficulty.  MRI of brain did not show evidence of metastasis.  His CXR has remained stable.  He has been weaned off suction and his chest tube was removed.  Follow up CXR is stable and free from significant pneumothorax.  The patient will require PET once discharge with follow up in [facility] clinic.  He is felt medically stable for discharge today.  Significant Diagnostic Studies:  CT Scan: Prior left pneumothorax has essentially resolved status post placement of left chest tube.  3.7 x 3.6 cm spiculated medial right upper lobe mass extending into the hilar region, suspicious for primary bronchogenic neoplasm.  Associated paratracheal nodal metastases, as above.  Treatments:   VIDEO BRONCHOSCOPY WITH ENDOBRONCHIAL ULTRASOUND (N/A) Right Upper Lobe Bronchus BIOPSY (Right) Transbronchial biopsy of #7 node  Disposition: Home  Discharge Medications:     Medication List    TAKE these medications        aspirin 120 MG suppository  Place 120 mg rectally every 6 (six) hours as needed for fever.     aspirin EC 81 MG tablet  Take 81 mg  by mouth daily.     benztropine 0.5 MG tablet  Commonly known as:  COGENTIN  Take 0.5 mg by mouth at bedtime.     clopidogrel 75 MG tablet  Commonly known as:  PLAVIX     ferrous fumarate 325 (106 FE) MG Tabs tablet  Commonly known as:  HEMOCYTE - 106 mg FE  Take 1 tablet by mouth.      haloperidol 5 MG tablet  Commonly known as:  HALDOL  Take 5 mg by mouth at bedtime.     levofloxacin 500 MG tablet  Commonly known as:  LEVAQUIN  Take 500 mg by mouth daily.     multivitamin with minerals tablet  Take 1 tablet by mouth daily.     Oxycodone HCl 10 MG Tabs  Take 1 tablet (10 mg total) by mouth every 3 (three) hours as needed for severe pain.     oxyCODONE 5 MG immediate release tablet  Commonly known as:  Oxy IR/ROXICODONE  Take 1 tablet (5 mg total) by mouth every 3 (three) hours as needed for moderate pain.     simvastatin 20 MG tablet  Commonly known as:  ZOCOR       Follow-up Information    Follow up with Ivan Isaac, MD On 01/28/2015.   Specialty:  Cardiothoracic Surgery   Contact information:   [address] [place] [address] [phone]       Signed: Ellwood Handler 01/29/2015, 8:50 AM

## 2015-01-28 NOTE — Progress Notes (Addendum)
      [address]       [place] [address]             [phone]      1 Day Post-Op Procedure(s) (LRB): VIDEO BRONCHOSCOPY WITH ENDOBRONCHIAL ULTRASOUND (N/A) Right Upper Lobe Bronchus BIOPSY (Right)   Subjective:  No complaints  Objective: Vital signs in last 24 hours: Temp:  [97.8 F (36.6 C)-99 F (37.2 C)] 99 F (37.2 C) (09/22 0333) Pulse Rate:  [85-95] 95 (09/22 0333) Cardiac Rhythm:  [-] Normal sinus rhythm (09/22 0700) Resp:  [11-19] 18 (09/22 0333) BP: (113-146)/(56-81) 146/76 mmHg (09/22 0333) SpO2:  [97 %-99 %] 99 % (09/22 0333)  Intake/Output from previous day: 09/21 0701 - 09/22 0700 In: 855 [P.O.:355; I.V.:500] Out: 1955 [Urine:1955] Intake/Output this shift: Total I/O In: -  Out: 300 [Urine:300]  General appearance: alert, cooperative and no distress Heart: regular rate and rhythm Lungs: clear to auscultation bilaterally Abdomen: soft, non-tender; bowel sounds normal; no masses,  no organomegaly Wound: clean and dry  Lab Results:  Recent Labs  01/25/15 1820 01/26/15 1454  WBC 5.5 6.1  HGB 14.1 13.5  HCT 42.3 40.0  PLT 180 219   BMET:  Recent Labs  01/25/15 1820 01/26/15 1454  NA 137 138  K 4.3 3.7  CL 103 101  CO2 25 28  GLUCOSE 98 108*  BUN 9 9  CREATININE 1.09 0.95  CALCIUM 9.1 9.1    PT/INR:  Recent Labs  01/26/15 1454  LABPROT 17.5*  INR 1.42   ABG No results found for: PHART, HCO3, TCO2, ACIDBASEDEF, O2SAT CBG (last 3)  No results for input(s): GLUCAP in the last 72 hours.  Assessment/Plan: S/P Procedure(s) (LRB): VIDEO BRONCHOSCOPY WITH ENDOBRONCHIAL ULTRASOUND (N/A) Right Upper Lobe Bronchus BIOPSY (Right)  1. Chest tube- no air leak appreciated, CXR free from pneumothorax- can possibly d/c chest tube today 2. CV- hemodynamically stable 3. Dispo- CT per Dr. Servando Snare, possibly remove today, home in AM   LOS: 3 days    Ivan Daniels 01/28/2015  Chest xray ok, d/c chest tube  today Poss home tomorrow PET scan as outpatient followup in [facility]/Oncology I have seen and examined Ivan Daniels and agree with the above assessment  and plan.  Grace Isaac MD Beeper [phone] Office [phone] 01/28/2015 8:59 AM

## 2015-01-29 ENCOUNTER — Inpatient Hospital Stay (HOSPITAL_COMMUNITY): Payer: Medicare Other

## 2015-01-29 MED ORDER — OXYCODONE HCL 5 MG PO TABS: 5.0000 mg | ORAL_TABLET | ORAL | Status: DC | PRN

## 2015-01-29 MED ORDER — OXYCODONE HCL 10 MG PO TABS: 10.0000 mg | ORAL_TABLET | ORAL | Status: DC | PRN

## 2015-01-29 NOTE — Progress Notes (Signed)
      [address]       [place] [address]             [phone]      2 Days Post-Op Procedure(s) (LRB): VIDEO BRONCHOSCOPY WITH ENDOBRONCHIAL ULTRASOUND (N/A) Right Upper Lobe Bronchus BIOPSY (Right)   Subjective:  Mr. Nevada Crane has no complaints.  Objective: Vital signs in last 24 hours: Temp:  [98.6 F (37 C)-98.8 F (37.1 C)] 98.6 F (37 C) (09/23 0511) Pulse Rate:  [83-86] 83 (09/23 0511) Cardiac Rhythm:  [-] Normal sinus rhythm (09/23 0700) Resp:  [16-18] 18 (09/23 0511) BP: (111-122)/(62-65) 111/65 mmHg (09/23 0511) SpO2:  [98 %] 98 % (09/23 0511)  Intake/Output from previous day: 09/22 0701 - 09/23 0700 In: 360 [P.O.:360] Out: 2700 [Urine:2700]  General appearance: alert, cooperative and no distress Heart: regular rate and rhythm Lungs: clear to auscultation bilaterally Abdomen: soft, non-tender; bowel sounds normal; no masses,  no organomegaly Wound: clean and dry  Lab Results:  Recent Labs  01/26/15 1454  WBC 6.1  HGB 13.5  HCT 40.0  PLT 219   BMET:  Recent Labs  01/26/15 1454  NA 138  K 3.7  CL 101  CO2 28  GLUCOSE 108*  BUN 9  CREATININE 0.95  CALCIUM 9.1    PT/INR:  Recent Labs  01/26/15 1454  LABPROT 17.5*  INR 1.42   ABG No results found for: PHART, HCO3, TCO2, ACIDBASEDEF, O2SAT CBG (last 3)  No results for input(s): GLUCAP in the last 72 hours.  Assessment/Plan: S/P Procedure(s) (LRB): VIDEO BRONCHOSCOPY WITH ENDOBRONCHIAL ULTRASOUND (N/A) Right Upper Lobe Bronchus BIOPSY (Right)  1. Chest tube- removed yesterday, CXR free from pneumothorax 2. Lung Cancer- will arrange outpatient PET CT 3. Dispo- patient stable, d/c home today   LOS: 4 days    Ahmed Prima, Junie Panning 01/29/2015

## 2015-01-29 NOTE — Progress Notes (Signed)
Discharge instructions given. Pt verbalized understanding and all questions were answered.  

## 2015-01-29 NOTE — Op Note (Signed)
NAME:  Ivan Daniels, Ivan Daniels NO.:  [id number]  MEDICAL RECORD NO.:  [id number]  LOCATION:  [id number]                        FACILITY:  [facility]  PHYSICIAN:  Lanelle Bal, MD    DATE OF BIRTH:  02-Apr-1946  DATE OF PROCEDURE:  01/27/2015 DATE OF DISCHARGE:                              OPERATIVE REPORT   PREOPERATIVE DIAGNOSIS:  Right hilar mass and left spontaneous pneumothorax.  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  PROCEDURES PERFORMED:  Video bronchoscopy with biopsy and EBUS with transbronchial biopsy.  SURGEON:  Lanelle Bal, MD.  BRIEF HISTORY:  The patient presented to his family care doctor with increasing shortness of breath.  A chest x-ray was obtained and noted a moderate-sized left spontaneous pneumothorax.  The patient was seen in the emergency room.  Chest tube was placed on the left by Dr. Roxy Manns. Subsequent chest x-ray suggested a right hilar mass.  A CT scan was performed demonstrating evidence of mediastinal adenopathy, and a right hilar mass suggestive of carcinoma.  MRI of the brain showed no evidence of metastatic disease.  The patient's pneumothorax quickly resolved without air leak.  With the chest tube still in place, we decided and recommended the patient and his family to proceed with bronchoscopy and EBUS to obtain a tissue diagnosis.  The patient agreed and signed informed consent.  DESCRIPTION OF PROCEDURE:  The patient underwent general endotracheal anesthesia without incident.  A single-lumen endotracheal tube was placed.  Appropriate time-out was performed.  We proceeded with a video bronchoscopy to the subsegmental level.  The left tracheobronchial tree appeared clear of endobronchial lesions.  At the takeoff of the right upper lobe bronchus, there was extrinsic mass narrowing the bronchus suggestive of tumor.  We then removed the video scope and placed an EBUS scope and easily identified  enlarged #7 nodes.  Multiple transbronchial biopsies were obtained, and this tissue was sent for an initial quick smear, and the remaining passes in satellite for further examination. We then removed the EBUS scope and again identified the area of tumor at the very takeoff of the right upper lobe bronchus.  Brushings of this area and multiple biopsies were obtained and submitted as right upper lobe bronchus biopsy.  Initial quick smear of the #7 node suggested non- small cell lung cancer, probable squamous cell.  The scope was removed. The patient was then extubated in the operating room and transferred to the recovery room for postoperative observation having tolerated the procedure without obvious complication.    Lanelle Bal, MD    EG/MEDQ  D:  01/29/2015  T:  01/29/2015  Job:  [id number]

## 2015-02-01 ENCOUNTER — Other Ambulatory Visit: Payer: Self-pay | Admitting: *Deleted

## 2015-02-01 ENCOUNTER — Encounter: Payer: Self-pay | Admitting: *Deleted

## 2015-02-01 DIAGNOSIS — R918 Other nonspecific abnormal finding of lung field: Secondary | ICD-10-CM

## 2015-02-01 NOTE — Progress Notes (Signed)
Oncology Nurse Navigator Documentation  Oncology Nurse Navigator Flowsheets 02/01/2015  Navigator Encounter Type Other/noteiced patient is out of the hospital.  I asked HIM to schedule with Dr. Julien Nordmann on 02/10/15 arrive at 1:45, labs placed.   Interventions Coordination of Care  Coordination of Care MD Appointments  Time Spent with Patient 15

## 2015-02-02 ENCOUNTER — Encounter: Payer: Medicaid Other | Admitting: *Deleted

## 2015-02-03 ENCOUNTER — Encounter: Payer: Self-pay | Admitting: Podiatry

## 2015-02-03 ENCOUNTER — Ambulatory Visit
Admission: RE | Admit: 2015-02-03 | Discharge: 2015-02-03 | Disposition: A | Discharge status: Home / Self Care | Payer: Medicaid Other | Source: Ambulatory Visit | Attending: Cardiothoracic Surgery | Admitting: Cardiothoracic Surgery

## 2015-02-03 ENCOUNTER — Ambulatory Visit (INDEPENDENT_AMBULATORY_CARE_PROVIDER_SITE_OTHER): Payer: Medicare Other | Admitting: Podiatry

## 2015-02-03 ENCOUNTER — Other Ambulatory Visit: Payer: Self-pay | Admitting: Cardiothoracic Surgery

## 2015-02-03 ENCOUNTER — Ambulatory Visit (INDEPENDENT_AMBULATORY_CARE_PROVIDER_SITE_OTHER): Payer: Medicare Other | Admitting: *Deleted

## 2015-02-03 DIAGNOSIS — J9383 Other pneumothorax: Secondary | ICD-10-CM

## 2015-02-03 DIAGNOSIS — R918 Other nonspecific abnormal finding of lung field: Secondary | ICD-10-CM | POA: Diagnosis not present

## 2015-02-03 DIAGNOSIS — Q828 Other specified congenital malformations of skin: Secondary | ICD-10-CM | POA: Diagnosis not present

## 2015-02-03 DIAGNOSIS — Z4802 Encounter for removal of sutures: Secondary | ICD-10-CM

## 2015-02-03 DIAGNOSIS — J939 Pneumothorax, unspecified: Secondary | ICD-10-CM

## 2015-02-03 DIAGNOSIS — M79673 Pain in unspecified foot: Secondary | ICD-10-CM

## 2015-02-03 NOTE — Progress Notes (Signed)
Subjective:     Patient ID: Ivan Daniels, male   DOB: 02-18-1946, 69 y.o.   MRN: [id number]  HPIThis patient presents to my office with painful areas on the bottom of both feet.  He says he lost his toes to frostbite and now develops callused areas on the bottom of both feet.   Review of Systems     Objective:   Physical Exam Objective: Review of past medical history, medications, social history and allergies were performed.  Vascular: Dorsalis pedis and posterior tibial pulses were palpable B/L, capillary refill was  WNL B/L, temperature gradient was WNL B/L   Skin:  No signs of symptoms of infection or ulcers on both feet.  Porokeratosis sub2 right foot and sub4th left foot.   Sensory: Thornell Mule monifilament WNL   Orthopedic: Orthopedic evaluation demonstrates all joints distal t ankle have full ROM without crepitus, muscle power WNL B/L    Assessment:     Porokeratosis B/L     Plan:    Debridement of porokeratosis

## 2015-02-04 NOTE — Progress Notes (Signed)
Ivan Daniels returns for suture removal from a previous left chest tube site for a left spontaneous pneumothorax.  The site is well healed and a chest xray shows no residual pneumothorax. He has f/u for a PET scan and [facility] for treatment of a right hilar mass and is aware.  I discussed the chest xray findings with Dr. Servando Snare today.

## 2015-02-08 ENCOUNTER — Ambulatory Visit (HOSPITAL_COMMUNITY)
Admission: RE | Admit: 2015-02-08 | Discharge: 2015-02-08 | Disposition: A | Discharge status: Home / Self Care | Payer: Medicare Other | Source: Ambulatory Visit | Attending: Cardiothoracic Surgery | Admitting: Cardiothoracic Surgery

## 2015-02-08 DIAGNOSIS — Z79899 Other long term (current) drug therapy: Secondary | ICD-10-CM | POA: Diagnosis not present

## 2015-02-08 DIAGNOSIS — J439 Emphysema, unspecified: Secondary | ICD-10-CM | POA: Insufficient documentation

## 2015-02-08 DIAGNOSIS — R918 Other nonspecific abnormal finding of lung field: Secondary | ICD-10-CM

## 2015-02-08 LAB — GLUCOSE, CAPILLARY: Glucose-Capillary: 88 mg/dL (ref 65–99)

## 2015-02-08 MED ORDER — FLUDEOXYGLUCOSE F - 18 (FDG) INJECTION
7.8000 | Freq: Once | INTRAVENOUS | Status: DC | PRN
  Administered 2015-02-08: 7.8 via INTRAVENOUS
  Filled 2015-02-08: qty 7.8

## 2015-02-10 ENCOUNTER — Encounter: Payer: Self-pay | Admitting: Internal Medicine

## 2015-02-10 ENCOUNTER — Ambulatory Visit (HOSPITAL_BASED_OUTPATIENT_CLINIC_OR_DEPARTMENT_OTHER): Payer: Medicare Other | Admitting: Internal Medicine

## 2015-02-10 ENCOUNTER — Other Ambulatory Visit (HOSPITAL_BASED_OUTPATIENT_CLINIC_OR_DEPARTMENT_OTHER): Payer: Medicare Other

## 2015-02-10 VITALS — BP 141/60 | HR 79 | Temp 98.4°F | Resp 18 | Ht 71.0 in | Wt 129.0 lb

## 2015-02-10 DIAGNOSIS — C3491 Malignant neoplasm of unspecified part of right bronchus or lung: Secondary | ICD-10-CM

## 2015-02-10 DIAGNOSIS — C3411 Malignant neoplasm of upper lobe, right bronchus or lung: Secondary | ICD-10-CM | POA: Diagnosis present

## 2015-02-10 DIAGNOSIS — Z720 Tobacco use: Secondary | ICD-10-CM

## 2015-02-10 DIAGNOSIS — R918 Other nonspecific abnormal finding of lung field: Secondary | ICD-10-CM

## 2015-02-10 DIAGNOSIS — Z789 Other specified health status: Secondary | ICD-10-CM

## 2015-02-10 DIAGNOSIS — C3490 Malignant neoplasm of unspecified part of unspecified bronchus or lung: Secondary | ICD-10-CM | POA: Insufficient documentation

## 2015-02-10 HISTORY — DX: Other specified health status: Z78.9

## 2015-02-10 LAB — COMPREHENSIVE METABOLIC PANEL (CC13)
ALT: 10 U/L (ref 0–55)
AST: 19 U/L (ref 5–34)
Albumin: 3.7 g/dL (ref 3.5–5.0)
Alkaline Phosphatase: 55 U/L (ref 40–150)
Anion Gap: 6 mEq/L (ref 3–11)
BUN: 8 mg/dL (ref 7.0–26.0)
CO2: 30 mEq/L — ABNORMAL HIGH (ref 22–29)
Calcium: 9.8 mg/dL (ref 8.4–10.4)
Chloride: 105 mEq/L (ref 98–109)
Creatinine: 1 mg/dL (ref 0.7–1.3)
EGFR: 87 mL/min/{1.73_m2} — ABNORMAL LOW (ref >90)
Glucose: 107 mg/dl (ref 70–140)
Potassium: 4.6 mEq/L (ref 3.5–5.1)
Sodium: 141 mEq/L (ref 136–145)
Total Bilirubin: <0.3 mg/dL (ref 0.20–1.20)
Total Protein: 7.4 g/dL (ref 6.4–8.3)

## 2015-02-10 LAB — CBC WITH DIFFERENTIAL/PLATELET
BASO%: 0.2 % (ref 0.0–2.0)
Basophils Absolute: 0 10*3/uL (ref 0.0–0.1)
EOS%: 1.4 % (ref 0.0–7.0)
Eosinophils Absolute: 0.1 10*3/uL (ref 0.0–0.5)
HCT: 35.3 % — ABNORMAL LOW (ref 38.4–49.9)
HGB: 11.9 g/dL — ABNORMAL LOW (ref 13.0–17.1)
LYMPH%: 24.2 % (ref 14.0–49.0)
MCH: 30.1 pg (ref 27.2–33.4)
MCHC: 33.7 g/dL (ref 32.0–36.0)
MCV: 89.1 fL (ref 79.3–98.0)
MONO#: 0.5 10*3/uL (ref 0.1–0.9)
MONO%: 8.2 % (ref 0.0–14.0)
NEUT#: 4.1 10*3/uL (ref 1.5–6.5)
NEUT%: 66 % (ref 39.0–75.0)
Platelets: 314 10*3/uL (ref 140–400)
RBC: 3.96 10*6/uL — ABNORMAL LOW (ref 4.20–5.82)
RDW: 14.9 % — ABNORMAL HIGH (ref 11.0–14.6)
WBC: 6.2 10*3/uL (ref 4.0–10.3)
lymph#: 1.5 10*3/uL (ref 0.9–3.3)
nRBC: 0 % (ref 0–0)

## 2015-02-10 MED ORDER — PROCHLORPERAZINE MALEATE 10 MG PO TABS
10.0000 mg | ORAL_TABLET | Freq: Four times a day (QID) | ORAL | Status: DC | PRN
Start: 2015-02-10 — End: 2016-07-10

## 2015-02-10 NOTE — Progress Notes (Signed)
[place] [phone]   [phone]  CONSULT NOTE  REFERRING PHYSICIAN: Dr. Lanelle Bal  REASON FOR CONSULTATION:  69 years old African-American male recently diagnosed with lung cancer.  HPI Ivan Daniels is a 69 y.o. male with past medical history significant for paranoid schizophrenia, benign prostatic hypertrophy, dyslipidemia, anemia, alcohol and substance abuse, carotid artery stenosis as well as left spontaneous pneumothorax. The patient also has a long history of smoking. 2 weeks ago the patient has been complaining of increasing cough as well as shortness of breath. He was seen by his primary care physician and chest x-ray was performed on 01/25/2015 and it showed tension left pneumothorax. The patient was advised to go immediately to the emergency department at [facility]. He had left chest tube placed by Dr. Roxy Manns. CT scan of the chest on 01/25/2015 showed 3.7 x 3.6 cm spiculated medial right upper lobe mass radiating into the hilar region. There was also 2.0 cm short axis lower right paratracheal nodes and 1.2 CM short axis left paratracheal node. There was also additional 6 mm nodular opacity in the right lung apex. The scan also showed near complete resolution of the prior left pneumothorax status post placement of left chest tube. MRI of the brain on 01/27/2015 showed no evidence for metastatic disease to the brain. On 01/27/2015 the patient underwent a video bronchoscopy with biopsies as well as endobronchial ultrasound and transbronchial biopsy under the care of Dr. Servando Snare. The final pathology (Accession: [id number]) of the left upper lobe biopsy as well as the level 7 lymph nodes were positive for invasive squamous cell carcinoma. A PET scan on 02/08/2015 showed markedly hypermetabolic right central upper lobe lung mass invading the right hilum and mediastinum consistent with neoplasm. There was also hypermetabolic AP window  lymph nodes no contralateral hilar adenopathy. There was no findings for metastatic disease involving the lung, abdomen, pelvis or bony structures. Dr. Servando Snare kindly referred the patient to me today for further evaluation and recommendation regarding treatment of his condition. When seen today the patient is feeling fine except for pain at the left side of the chest at the insertion site of the chest tube. He denied having any significant shortness breath, cough or hemoptysis. The patient has some difficulty swallowing. He denied having any significant weight loss or night sweats. He has no headache or visual changes. He denied having any significant nausea or vomiting, no fever or chills. Family history significant for a mother and father died from old age and sister with breast cancer. The patient is single and has 2 daughters. He was accompanied by his sister Jetty Duhamel. He is to go Architect work. The patient has a history of smoking 1 pack per day for around 52 years and unfortunately he continues to smoke a few cigarettes every day. He also has a history of alcohol and drug abuse but not recently.  HPI  Past Medical History  Diagnosis Date  . Hyperlipidemia   . TOBACCO ABUSE 05/24/2009    Qualifier: Diagnosis of  By: Hassell Done FNP, Tori Milks    . Paranoid schizophrenia ([place]) 10/31/2009    Qualifier: Diagnosis of  By: Jorene Minors, [place]    . BENIGN PROSTATIC HYPERTROPHY, HX OF 12/19/2006    Qualifier: Diagnosis of  By: Radene Ou MD, [place]    . ALCOHOL ABUSE 11/12/2009    Qualifier: Diagnosis of  By: Hassell Done FNP, Tori Milks    . Primary spontaneous pneumothorax, left 01/25/2015  . Lung  mass 01/25/2015    3.7 x 3.6 cm RUL spiculated lung mass with 2.0 cm right paratracheal lymph node suspicious for bronchogenic CA  . Non-small cell lung cancer ([place]) 02/10/2015    Past Surgical History  Procedure Laterality Date  . Video bronchoscopy with endobronchial ultrasound N/A 01/27/2015    Procedure:  VIDEO BRONCHOSCOPY WITH ENDOBRONCHIAL ULTRASOUND;  Surgeon: Grace Isaac, MD;  Location: [place];  Service: Thoracic;  Laterality: N/A;  . Lung biopsy Right 01/27/2015    Procedure: Right Upper Lobe Bronchus BIOPSY;  Surgeon: Grace Isaac, MD;  Location: [place];  Service: Thoracic;  Laterality: Right;    No family history on file.  Social History Social History  Substance Use Topics  . Smoking status: Current Every Day Smoker -- 0.50 packs/day for 30 years    Types: Cigarettes  . Smokeless tobacco: Not on file  . Alcohol Use: Not on file    Allergies  Allergen Reactions  . Benztropine Mesylate     REACTION: decrease ROM neck  . Chlorpromazine Hcl     Current Outpatient Prescriptions  Medication Sig Dispense Refill  . aspirin EC 81 MG tablet Take 81 mg by mouth daily.    . benztropine (COGENTIN) 0.5 MG tablet Take 0.5 mg by mouth at bedtime.     . clopidogrel (PLAVIX) 75 MG tablet     . ferrous fumarate (HEMOCYTE - 106 MG FE) 325 (106 FE) MG TABS tablet Take 1 tablet by mouth.    . haloperidol (HALDOL) 5 MG tablet Take 5 mg by mouth at bedtime.     . Multiple Vitamins-Minerals (MULTIVITAMIN WITH MINERALS) tablet Take 1 tablet by mouth daily.    . simvastatin (ZOCOR) 20 MG tablet     . aspirin 120 MG suppository Place 120 mg rectally every 6 (six) hours as needed for fever.    Marland Kitchen oxyCODONE (OXY IR/ROXICODONE) 5 MG immediate release tablet Take 1 tablet (5 mg total) by mouth every 3 (three) hours as needed for moderate pain. (Patient not taking: Reported on 02/10/2015) 30 tablet 0  . oxyCODONE 10 MG TABS Take 1 tablet (10 mg total) by mouth every 3 (three) hours as needed for severe pain. (Patient not taking: Reported on 02/10/2015) 30 tablet 0   No current facility-administered medications for this visit.   Facility-Administered Medications Ordered in Other Visits  Medication Dose Route Frequency Provider Last Rate Last Dose  . fludeoxyglucose F - 18 (FDG) injection 7.8 milli  Curie  7.8 milli Curie Intravenous Once PRN Medication Radiologist, MD   7.8 milli Curie at 02/08/15 1345    Review of Systems  Constitutional: negative Eyes: negative Ears, nose, mouth, throat, and face: negative Respiratory: positive for pleurisy/chest pain Cardiovascular: negative Gastrointestinal: negative Genitourinary:negative Integument/breast: negative Hematologic/lymphatic: negative Musculoskeletal:negative Neurological: negative Behavioral/Psych: negative Endocrine: negative Allergic/Immunologic: negative  Physical Exam  ZJI:RCVEL, healthy, no distress, well nourished and well developed SKIN: skin color, texture, turgor are normal, no rashes or significant lesions HEAD: Normocephalic, No masses, lesions, tenderness or abnormalities EYES: normal, PERRLA, Conjunctiva are pink and non-injected EARS: External ears normal, Canals clear OROPHARYNX:no exudate, no erythema and lips, buccal mucosa, and tongue normal  NECK: supple, no adenopathy, no JVD LYMPH:  no palpable lymphadenopathy, no hepatosplenomegaly LUNGS: clear to auscultation , and palpation HEART: regular rate & rhythm, no murmurs and no gallops ABDOMEN:abdomen soft, non-tender, normal bowel sounds and no masses or organomegaly BACK: Back symmetric, no curvature., No CVA tenderness EXTREMITIES:no joint deformities, effusion, or inflammation,  no edema, no skin discoloration  NEURO: alert & oriented x 3 with fluent speech, no focal motor/sensory deficits  PERFORMANCE STATUS: ECOG 1  LABORATORY DATA: Lab Results  Component Value Date   WBC 6.2 02/10/2015   HGB 11.9* 02/10/2015   HCT 35.3* 02/10/2015   MCV 89.1 02/10/2015   PLT 314 02/10/2015      Chemistry      Component Value Date/Time   NA 141 02/10/2015 1304   NA 138 01/26/2015 1454   K 4.6 02/10/2015 1304   K 3.7 01/26/2015 1454   CL 101 01/26/2015 1454   CO2 30* 02/10/2015 1304   CO2 28 01/26/2015 1454   BUN 8.0 02/10/2015 1304   BUN 9  01/26/2015 1454   CREATININE 1.0 02/10/2015 1304   CREATININE 0.95 01/26/2015 1454      Component Value Date/Time   CALCIUM 9.8 02/10/2015 1304   CALCIUM 9.1 01/26/2015 1454   ALKPHOS 55 02/10/2015 1304   ALKPHOS 52 01/26/2015 1454   AST 19 02/10/2015 1304   AST 21 01/26/2015 1454   ALT 10 02/10/2015 1304   ALT 11* 01/26/2015 1454   BILITOT <0.30 02/10/2015 1304   BILITOT <0.1* 01/26/2015 1454       RADIOGRAPHIC STUDIES: Dg Chest 2 View  02/03/2015   CLINICAL DATA:  Recent bronchoscopy and biopsy.  Pneumothorax.  EXAM: CHEST  2 VIEW  COMPARISON:  01/29/2015  FINDINGS: Cardiac silhouette is within normal limits. Right suprahilar mass is unchanged. No airspace consolidation, edema, pleural effusion, or pneumothorax is identified. No acute osseous abnormality is identified.  IMPRESSION: No pneumothorax.  Unchanged right suprahilar mass.   Electronically Signed   By: Logan Bores M.D.   On: 02/03/2015 11:26   Dg Chest 2 View  01/29/2015   CLINICAL DATA:  Status post video bronchoscopy with endobronchial ultrasound and biopsy. Z78.9 (ICD-10-CM) - Chest tube in place  EXAM: CHEST  2 VIEW  COMPARISON:  01/28/2015  FINDINGS: Left chest tube has been removed since the previous study. No pneumothorax.  Right suprahilar mass is stable. No lung consolidation or edema. Cardiac silhouette is normal in size. Normal mediastinal contours. No left hilar mass or adenopathy.  Bony thorax is intact.  IMPRESSION: 1. Status post removal of the left chest tube.  No pneumothorax. 2. No acute cardiopulmonary disease.   Electronically Signed   By: Lajean Manes M.D.   On: 01/29/2015 08:10   Dg Chest 2 View  01/25/2015   CLINICAL DATA:  Dry cough.  EXAM: CHEST  2 VIEW  COMPARISON:  12/30/2010  FINDINGS: Mediastinum is normal. Heart size normal. Right suprahilar infiltrate versus mass lesion noted. Prominent left pneumothorax with some degree of tension. Tiny nodular opacities projected over both lungs consistent with  nipple shadows, similar findings noted on prior study . Left pleural effusion. No acute bony abnormality.  IMPRESSION: 1. Tension left pneumothorax. 2. Right suprahilar infiltrate versus mass. Critical Value/emergent results were called by telephone at the time of interpretation on 01/25/2015 at 2:03 pm to nurse Maudie Mercury , who verbally acknowledged these results.   Electronically Signed   By: Marcello Moores  Register   On: 01/25/2015 14:06   Ct Chest W Contrast  01/25/2015   CLINICAL DATA:  Status post chest tube for tension pneumothorax  EXAM: CT CHEST WITH CONTRAST  TECHNIQUE: Multidetector CT imaging of the chest was performed during intravenous contrast administration.  CONTRAST:  44m OMNIPAQUE IOHEXOL 300 MG/ML  SOLN  COMPARISON:  Chest radiographs dated 01/25/2015  FINDINGS: Mediastinum/Nodes: Heart is normal in size.  Mild coronary atherosclerosis.  2.0 cm short axis low right paratracheal node (series 2/ image 24). 12 mm short axis left paratracheal node (series 2/image 24).  Visualized thyroid is unremarkable.  Lungs/Pleura: 3.7 x 3.6 cm spiculated medial right upper lobe mass radiating into the hilar region (series 3/ image 25).  Additional 6 mm nodular opacity in the right lung apex (series 3/image 11).  Underlying moderate paraseptal emphysematous changes.  Subpleural reticulation/fibrosis, predominantly in the left lower lobe and lingula (series 3/image 46).  Near complete resolution of prior left pneumothorax status post placement of left chest tube. Trace residual pleural gas along the left fissure inferiorly (series 3/image 50), of no consequence.  Upper abdomen: Visualized upper abdomen is notable for tiny probable hepatic cysts measuring up to 5 mm (series 2/ image 55), although technically too small to characterize.  Musculoskeletal: No focal osseous lesions.  Subcutaneous gas along the left anterior chest wall.  IMPRESSION: Prior left pneumothorax has essentially resolved status post placement of left  chest tube.  3.7 x 3.6 cm spiculated medial right upper lobe mass extending into the hilar region, suspicious for primary bronchogenic neoplasm.  Associated paratracheal nodal metastases, as above.   Electronically Signed   By: Julian Hy M.D.   On: 01/25/2015 23:02   Mr Jeri Cos SW Contrast  01/27/2015   CLINICAL DATA:  Persistent cough, lung mass seen on yesterday's CT chest concerning for neoplasm. Assess for intracranial metastasis. History of schizophrenia and alcohol abuse.  EXAM: MRI HEAD WITHOUT AND WITH CONTRAST  TECHNIQUE: Multiplanar, multiecho pulse sequences of the brain and surrounding structures were obtained without and with intravenous contrast.  CONTRAST:  35m MULTIHANCE GADOBENATE DIMEGLUMINE 529 MG/ML IV SOLN  COMPARISON:  None.  FINDINGS: Moderate ventriculomegaly on the basis of global parenchymal brain volume loss. No abnormal parenchymal signal, mass lesions, mass effect. No abnormal parenchymal enhancement. Minimal white matter T2 hyperintensities compatible with chronic small vessel ischemic disease, less than expected for age. No reduced diffusion to suggest acute ischemia. No susceptibility artifact to suggest hemorrhage.  No abnormal extra-axial fluid collections. No extra-axial masses nor leptomeningeal enhancement. Normal major intracranial vascular flow voids seen at the skull base.  Ocular globes and orbital contents are unremarkable though not tailored for evaluation. No suspicious calvarial bone marrow signal. No abnormal sellar expansion. Craniocervical junction maintained. Mild paranasal sinus mucosal thickening. The mastoid air cells are well aerated. Patient is edentulous.  IMPRESSION: No acute intracranial process nor MR findings of intracranial metastasis.  Moderate global brain volume loss.   Electronically Signed   By: CElon AlasM.D.   On: 01/27/2015 00:24   Nm Pet Image Initial (pi) Skull Base To Thigh  02/08/2015   CLINICAL DATA:  Initial treatment  strategy for right lung mass.  EXAM: NUCLEAR MEDICINE PET SKULL BASE TO THIGH  TECHNIQUE: 7.8 mCi F-18 FDG was injected intravenously. Full-ring PET imaging was performed from the skull base to thigh after the radiotracer. CT data was obtained and used for attenuation correction and anatomic localization.  FASTING BLOOD GLUCOSE:  Value: 80 mg/dl  COMPARISON:  Chest CT 01/25/2015  FINDINGS: NECK  No hypermetabolic lymph nodes in the neck.  CHEST  The large central lung mass invading the right hilum and mediastinum is markedly hypermetabolic with SUV max of 296.7 Weakly positive aorticopulmonary window lymph node measuring 3.5 SUV. No contralateral hilar adenopathy. Mild diffuse activity in the esophagus is likely normal.  Curvilinear soft  tissue density in the anterior mediastinum is not hypermetabolic and may represent some type of thymic remnant.  Moderate paraseptal emphysema noted in the upper lung zones bilaterally. Right upper lobe scarring change without hypermetabolism.  Somewhat linear area of hypermetabolism noted in the left chest wall area. This extends from the scan down to the lower aspect of the left pectoralis muscle. It may be old granulation tissue from previous chest wound. It does not have the appearance of a metastatic focus.  ABDOMEN/PELVIS  No abnormal hypermetabolic activity within the liver, pancreas, adrenal glands, or spleen. No hypermetabolic lymph nodes in the abdomen or pelvis.  SKELETON  No focal hypermetabolic activity to suggest skeletal metastasis.  IMPRESSION: 1. Markedly hypermetabolic right central upper lobe lung mass invading the right hilum and mediastinum consistent with neoplasm. 2. Hypermetabolic aorticopulmonary window lymph nodes. No contralateral hilar adenopathy. 3. No findings for metastatic disease involving the lungs, abdomen, pelvis or bony structures. 4. Paraseptal emphysema.   Electronically Signed   By: Marijo Sanes M.D.   On: 02/08/2015 15:28   Dg Chest Port 1  View  01/28/2015   CLINICAL DATA:  Spontaneous left pneumothorax. Left chest tube in place.  EXAM: PORTABLE CHEST - 1 VIEW  COMPARISON:  01/26/2015 and earlier, including CT chest 01/25/2015.  FINDINGS: Left chest tube in place with no pneumothorax. Central right upper lobe lung mass and right paratracheal lymphadenopathy as noted previously. Lungs otherwise clear. Hyperinflation and emphysematous changes as noted previously. Cardiac silhouette normal in size, unchanged.  IMPRESSION: 1. No evidence of pneumothorax with left chest tube in place. 2. Central right upper lobe lung mass and right paratracheal lymphadenopathy as no previously. 3. COPD/emphysema.  No acute cardiopulmonary disease otherwise.   Electronically Signed   By: Evangeline Dakin M.D.   On: 01/28/2015 08:12   Dg Chest Port 1 View  01/26/2015   CLINICAL DATA:  Atelectasis, spontaneous pneumothorax status post left chest tube, lung mass  EXAM: PORTABLE CHEST - 1 VIEW  COMPARISON:  CT chest dated 01/25/2015  FINDINGS: Left chest tube without visualized pneumothorax.  Medial right upper lobe/hilar mass, better evaluated on CT.  The heart is normal in size.  IMPRESSION: Left chest tube without visualized pneumothorax.  Medial right upper lobe/hilar mass, better visualized on CT.   Electronically Signed   By: Julian Hy M.D.   On: 01/26/2015 05:04   Dg Chest Port 1 View  01/25/2015   CLINICAL DATA:  Left-sided chest tube inserted. Check tube position.  EXAM: PORTABLE CHEST - 1 VIEW  COMPARISON:  Nineteen 2016  FINDINGS: Left-sided chest tube has been placed. There has been near complete evacuation of left pneumothorax. Small left basilar pneumothorax persists. The lungs are hyperinflated. There is perihilar peribronchial thickening. Prominence of the right hilar region is again noted and warrants follow-up.  IMPRESSION: 1. Interval near complete evacuation of left pneumothorax following tube placement. 2. Persistent right hilar prominence.    Electronically Signed   By: Nolon Nations M.D.   On: 01/25/2015 18:42    ASSESSMENT: This is a very pleasant 68 years old African-American male recently diagnosed with a stage IIIa (T2a, N2, M0) non-small cell lung cancer consistent with invasive squamous cell carcinoma presented with right upper lobe lung mass in addition to right hilar and mediastinal lymphadenopathy diagnosed in September 2016.   PLAN: I had a lengthy discussion with the patient and his sister today about his current disease stage, prognosis and treatment options. I recommended for the patient  a course of concurrent chemoradiation with weekly carboplatin for AUC of 2 and paclitaxel 45 MG/M2. I discussed with the patient adverse effect of the chemotherapy including but not limited to alopecia, myelosuppression, nausea and vomiting, peripheral neuropathy, liver or renal dysfunction. I will arrange for the patient to have a chemotherapy education class before starting the first dose of his chemotherapy. I will refer the patient to radiation oncology for evaluation and discussion of the radiotherapy option. I expect the patient to start the first cycle of concurrent chemoradiation on 02/22/2015. I will call his pharmacy with prescription for Compazine 10 mg by mouth every 6 hours as needed for nausea. The patient would come back for follow-up visit in 3 weeks for reevaluation and management of any adverse effect of his treatment. He was advised to call immediately if he has any concerning symptoms in the interval. CODE STATUS was discussed with the patient and he would like to continue with full CODE STATUS. For smoke cessation I strongly encouraged the patient to quit smoking and offered him to smoke cessation program.  The patient voices understanding of current disease status and treatment options and is in agreement with the current care plan.  All questions were answered. The patient knows to call the clinic with any  problems, questions or concerns. We can certainly see the patient much sooner if necessary.  Thank you so much for allowing me to participate in the care of [employer]. I will continue to follow up the patient with you and assist in his care.  I spent 55 minutes counseling the patient face to face. The total time spent in the appointment was 80 minutes.  Disclaimer: This note was dictated with voice recognition software. Similar sounding words can inadvertently be transcribed and may not be corrected upon review.   Gaurav Baldree K. February 10, 2015, 3:15 PM

## 2015-02-11 ENCOUNTER — Telehealth: Payer: Self-pay | Admitting: Internal Medicine

## 2015-02-11 ENCOUNTER — Encounter (HOSPITAL_COMMUNITY): Payer: Self-pay

## 2015-02-11 NOTE — Telephone Encounter (Signed)
Phone states no one available and has no voicemail,have made appointments and mailed out calendars  anne

## 2015-02-16 ENCOUNTER — Other Ambulatory Visit: Payer: Medicare Other

## 2015-02-16 ENCOUNTER — Encounter: Payer: Self-pay | Admitting: Radiation Oncology

## 2015-02-16 ENCOUNTER — Encounter: Payer: Self-pay | Admitting: *Deleted

## 2015-02-16 NOTE — Progress Notes (Addendum)
Thoracic Location of Tumor / Histology: Right Upper Lobe Mass Lung, radiation into hilar region(Stage IIa,N2,MO) non small -cell consistent with invasive squamous cell carcinoma   Patient presented months ago with symptoms of: shortness of breath with exertion, occasional difficulty swallowing, non productive cough   Biopsies of  (if applicable) revealed: Diagnosis 01/27/15: Dr.  Lanelle Bal, MD Bronchus, biopsy, Right upper lobe- INVASIVE SQUAMOUS CELL CARCINOMA. BRONCHIAL BRUSHING, RIGHT, B (SPECIMEN 2 OF 2, COLLECTED ON 01/27/15): SQUAMOUS CELL CARCINOMA. Diagnosis 01/27/15: FINE NEEDLE ASPIRATION: NEEDLE ASPIRATION: NEEDLE ASPIRATION, EBUS, 7 NODE, A (SPECIMEN 1 OF 2, COLLECTED ON 01/27/15): SQUAMOUS CELL CARCINOMA.  Tobacco/substance: 1PPD  Cigarettes x 52 years, still smokes a few daily, , alcohol and substance abuse   Past/Anticipated interventions by cardiothoracic surgery, if any:  Left side Chest tube removed 02/03/15. Site well healed.  Past/Anticipated interventions by medical oncology, if any: Dr. Julien Nordmann seen 02/10/15. Chemo class 02/16/15, 1st taxol/carbo infusion 02/22/15 with F.U Dr. Julien Nordmann 03/01/15  Signs/Symptoms  Weight changes, if any: 2 lb weight loss in a month  Respiratory complaints, if any: occasional difficulty swallowing, sob with exertion, non productive cough  Hemoptysis, if any: no  Pain issues, if any:  no  SAFETY ISSUES:  Prior radiation? NO  Pacemaker/ICD? NO Is the patient on methotrexate? NO  Current Complaints / other details:  Single, 2 daughters, paranoid schizophrenia,BPH,carotid artery stenosis, (as well as left spontaneous pneumothorax,=01/25/15), toes missing from frostbite,  Father&Mother  Deceased old age. Here today with oldest sister. Patient is one of six siblings. Oldest sister aided younger sister through breast ca.   Left eye ptosis. Denies diplopia, floaters or blurred vision. Denies nausea or vomiting. Reports an episode of  dizziness preceding a fall in September. Sister reports the patient "lays down a lot." Patient states, "i am bored; there is nothing to do."

## 2015-02-17 ENCOUNTER — Encounter: Payer: Self-pay | Admitting: Radiation Oncology

## 2015-02-17 ENCOUNTER — Ambulatory Visit
Admission: RE | Admit: 2015-02-17 | Discharge: 2015-02-17 | Disposition: A | Discharge status: Home / Self Care | Payer: Medicare Other | Source: Ambulatory Visit | Attending: Radiation Oncology | Admitting: Radiation Oncology

## 2015-02-17 ENCOUNTER — Telehealth: Payer: Self-pay | Admitting: *Deleted

## 2015-02-17 ENCOUNTER — Telehealth: Payer: Self-pay | Admitting: Internal Medicine

## 2015-02-17 ENCOUNTER — Other Ambulatory Visit: Payer: Self-pay | Admitting: Internal Medicine

## 2015-02-17 VITALS — BP 134/54 | HR 75 | Temp 98.0°F | Resp 16 | Ht 71.0 in | Wt 131.0 lb

## 2015-02-17 DIAGNOSIS — Z87891 Personal history of nicotine dependence: Secondary | ICD-10-CM | POA: Diagnosis not present

## 2015-02-17 DIAGNOSIS — C3412 Malignant neoplasm of upper lobe, left bronchus or lung: Secondary | ICD-10-CM | POA: Diagnosis not present

## 2015-02-17 DIAGNOSIS — Z809 Family history of malignant neoplasm, unspecified: Secondary | ICD-10-CM | POA: Insufficient documentation

## 2015-02-17 DIAGNOSIS — C3411 Malignant neoplasm of upper lobe, right bronchus or lung: Secondary | ICD-10-CM | POA: Insufficient documentation

## 2015-02-17 DIAGNOSIS — F200 Paranoid schizophrenia: Secondary | ICD-10-CM | POA: Insufficient documentation

## 2015-02-17 DIAGNOSIS — Z510 Encounter for antineoplastic radiation therapy: Secondary | ICD-10-CM | POA: Insufficient documentation

## 2015-02-17 DIAGNOSIS — C3490 Malignant neoplasm of unspecified part of unspecified bronchus or lung: Secondary | ICD-10-CM

## 2015-02-17 DIAGNOSIS — C969 Malignant neoplasm of lymphoid, hematopoietic and related tissue, unspecified: Secondary | ICD-10-CM | POA: Diagnosis not present

## 2015-02-17 DIAGNOSIS — E785 Hyperlipidemia, unspecified: Secondary | ICD-10-CM | POA: Diagnosis not present

## 2015-02-17 HISTORY — DX: Allergy, unspecified, initial encounter: T78.40XA

## 2015-02-17 NOTE — Telephone Encounter (Signed)
per pof to delay pt trmt-cld and spoke w/Dorothy patients sister and gave appt time & date for 10/24

## 2015-02-17 NOTE — Progress Notes (Signed)
See progress note under physician encounter. 

## 2015-02-17 NOTE — Telephone Encounter (Signed)
-----   Message from Curt Bears, MD sent at 02/16/2015  3:35 PM EDT ----- Regarding: RE: PAC  OK to do Port per his request. ----- Message -----    From: Lucile Crater, RN    Sent: 02/16/2015  10:06 AM      To: Curt Bears, MD, Ardeen Garland, RN Subject: [facility]                                            Dr. Julien Nordmann,  Pt went to chemo ed class and told them he would like a PAC. She advised she would let MD know, however his 1st tx on 10/17 would be PIV.  Can I place this order for pt?  Stanton Kidney

## 2015-02-17 NOTE — Telephone Encounter (Signed)
POF to scheduling, order entered. LMOVM for pt - expect a call from scheduling about Port placement date/time. 1st treatment will more than likely be with PIV. Call office with concerns

## 2015-02-17 NOTE — Progress Notes (Signed)
Radiation Oncology         [phone] ________________________________  Name: Ivan Daniels MRN: [id number]  Date: 02/17/2015  DOB: [birth year]  HC:WCBJSEG,BTDVV N, MD  Curt Bears, MD     REFERRING PHYSICIAN: Curt Bears, MD   DIAGNOSIS: The primary encounter diagnosis was Malignant neoplasm of right upper lobe of lung ([place]). A diagnosis of Primary cancer of right upper lobe of lung ([place]) was also pertinent to this visit.    HISTORY OF PRESENT ILLNESS::[name] is a 69 y.o. male who is seen for an initial consultation visit regarding the patient's diagnosis of (Stage IIa,N2,MO) non small -cell lung cancer .  The patient presented with increasing cough as well as shortness of breath beginning in September 2016. On 01/25/15, his primary care physician ordered a chest X-ray which showed tension in the left pneumothorax. The patient was advised to go immediately to the ED at [facility]. He had a left chest tube placed by Dr. Roxy Manns. CT scan of the chest on 01/25/15 revealed a 3.7 x 3.6 cm spiculated medial right upper lobe mass radiating into the hilar region. There was also 2.0 cm short axis lower right paratracheal nodes and 1.2 cm short axis left paratracheal node. There was also an additional 6 mm nodular opacity in the right lung apex. The scan also showed near complete resolution of the prior left pneumothorax status post placement of left chest tube.   MRI of the brain on 01/27/2015 showed no evidence for metastatic disease to the brain. On 01/27/2015 the patient underwent a video bronchoscopy with biopsies as well as endobronchial ultrasound and transbronchial biopsy under the care of Dr. 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 A PET scan on 02/08/2015 showed markedly hypermetabolic right central upper lobe lung mass invading the right  hilum and mediastinum consistent with neoplasm. There was also hypermetabolic AP window lymph nodes no contralateral hilar adenopathy. There was no findings for metastatic disease involving the lung, abdomen, pelvis or bony structures.  Patients is feeling well on today's visit. He noted recently that he had difficulty swallowing due to coughing but the symptoms have since subsided. Left side Chest tube removed 02/03/15. Site well healed.   Patient works in [employer]. He has a history of smoking 1 pack per day for around 52 years, he continues to smoke a few cigarettes every day. He also has a history of alcohol and drug abuse but not recently.   Patient has significant past medical history significant for paranoid schizophrenia, benign prostatic hypertrophy, dyslipidemia, anemia, alcohol and substance abuse, carotid artery stenosis as well as left spontaneous pneumothorax.    PREVIOUS RADIATION THERAPY: No   PAST MEDICAL HISTORY:  has a past medical history of Hyperlipidemia; TOBACCO ABUSE (05/24/2009); Paranoid schizophrenia ([place]) (10/31/2009); BENIGN PROSTATIC HYPERTROPHY, HX OF (12/19/2006); Primary spontaneous pneumothorax, left (01/25/2015); Lung mass (01/25/2015); Full code status (02/10/2015); Allergy; ALCOHOL ABUSE (11/12/2009); and Non-small cell lung cancer ([place]) (01/27/15).     PAST SURGICAL HISTORY: Past Surgical History  Procedure Laterality Date  . Video bronchoscopy with endobronchial ultrasound N/A 01/27/2015    Procedure: VIDEO BRONCHOSCOPY WITH ENDOBRONCHIAL ULTRASOUND;  Surgeon: Grace Isaac, MD;  Location: [place];  Service: Thoracic;  Laterality: N/A;  . Lung biopsy Right 01/27/2015    Procedure: Right Upper Lobe Bronchus BIOPSY;  Surgeon: Grace Isaac, MD;  Location: [place];  Service: Thoracic;  Laterality: Right;     FAMILY HISTORY: family history includes Cancer in his sister.   SOCIAL HISTORY:  reports that he has been smoking Cigarettes.  He has a 26 pack-year  smoking history. He has never used smokeless tobacco. He reports that he does not drink alcohol or use illicit drugs.   ALLERGIES: Benztropine mesylate and Chlorpromazine hcl   MEDICATIONS:  Current Outpatient Prescriptions  Medication Sig Dispense Refill  . aspirin 120 MG suppository Place 120 mg rectally every 6 (six) hours as needed for fever.    . benztropine (COGENTIN) 0.5 MG tablet Take 0.5 mg by mouth at bedtime.     . clopidogrel (PLAVIX) 75 MG tablet     . ferrous fumarate (HEMOCYTE - 106 MG FE) 325 (106 FE) MG TABS tablet Take 1 tablet by mouth.    . haloperidol (HALDOL) 5 MG tablet Take 5 mg by mouth at bedtime.     . Multiple Vitamins-Minerals (MULTIVITAMIN WITH MINERALS) tablet Take 1 tablet by mouth daily.    . simvastatin (ZOCOR) 20 MG tablet     . oxyCODONE (OXY IR/ROXICODONE) 5 MG immediate release tablet Take 1 tablet (5 mg total) by mouth every 3 (three) hours as needed for moderate pain. (Patient not taking: Reported on 02/10/2015) 30 tablet 0  . oxyCODONE 10 MG TABS Take 1 tablet (10 mg total) by mouth every 3 (three) hours as needed for severe pain. (Patient not taking: Reported on 02/10/2015) 30 tablet 0  . prochlorperazine (COMPAZINE) 10 MG tablet Take 1 tablet (10 mg total) by mouth every 6 (six) hours as needed for nausea or vomiting. (Patient not taking: Reported on 02/17/2015) 30 tablet 0   No current facility-administered medications for this encounter.     REVIEW OF SYSTEMS:  A 15 point review of systems is documented in the electronic medical record. This was obtained by the nursing staff. However, I reviewed this with the patient to discuss relevant findings and make appropriate changes.  Pertinent items are noted in HPI.    PHYSICAL EXAM:  height is '5\' 11"'$  (1.803 m) and weight is 131 lb (59.421 kg). His oral temperature is 98 F (36.7 C). His blood pressure is 134/54 and his pulse is 75. His respiration is 16 and oxygen saturation is 100%.   General:  Well-developed, in no acute distress HEENT: Normocephalic, atraumatic; oral cavity clear Neck: Supple without any lymphadenopathy Cardiovascular: Regular rate and rhythm Respiratory: Decreased breath sounds in the right upper lung, otherwise Clear to auscultation bilaterally GI: Soft, nontender, normal bowel sounds Extremities: No edema present Neuro: No focal deficits     ECOG = 1  0 - Asymptomatic (Fully active, able to carry on all predisease activities without restriction)  1 - Symptomatic but completely ambulatory (Restricted in physically strenuous activity but ambulatory and able to carry out work of a light or sedentary nature. For example, light housework, office work)  2 - Symptomatic, <50% in bed during the day (Ambulatory and capable of all self care but unable to carry out any work activities. Up and about more than 50% of waking hours)  3 - Symptomatic, >50% in bed, but not bedbound (Capable of only limited self-care, confined to bed or chair 50% or more of waking hours)  4 - Bedbound (Completely disabled. Cannot carry on any self-care. Totally confined to bed or chair)  5 - Death   Eustace Pen MM, Creech RH, Tormey DC, et al. [phone]). "Toxicity and response criteria of  the [facility]". [place] Oncol. 5 (6): 649-55  _   LABORATORY DATA:  Lab Results  Component Value Date   WBC 6.2 02/10/2015   HGB 11.9* 02/10/2015   HCT 35.3* 02/10/2015   MCV 89.1 02/10/2015   PLT 314 02/10/2015   Lab Results  Component Value Date   NA 141 02/10/2015   K 4.6 02/10/2015   CL 101 01/26/2015   CO2 30* 02/10/2015   Lab Results  Component Value Date   ALT 10 02/10/2015   AST 19 02/10/2015   ALKPHOS 55 02/10/2015   BILITOT <0.30 02/10/2015      RADIOGRAPHY: Dg Chest 2 View  02/03/2015  CLINICAL DATA:  Recent bronchoscopy and biopsy.  Pneumothorax. EXAM: CHEST  2 VIEW COMPARISON:  01/29/2015 FINDINGS: Cardiac silhouette is within normal limits.  Right suprahilar mass is unchanged. No airspace consolidation, edema, pleural effusion, or pneumothorax is identified. No acute osseous abnormality is identified. IMPRESSION: No pneumothorax.  Unchanged right suprahilar mass. Electronically Signed   By: Logan Bores M.D.   On: 02/03/2015 11:26   Dg Chest 2 View  01/29/2015  CLINICAL DATA:  Status post video bronchoscopy with endobronchial ultrasound and biopsy. Z78.9 (ICD-10-CM) - Chest tube in place EXAM: CHEST  2 VIEW COMPARISON:  01/28/2015 FINDINGS: Left chest tube has been removed since the previous study. No pneumothorax. Right suprahilar mass is stable. No lung consolidation or edema. Cardiac silhouette is normal in size. Normal mediastinal contours. No left hilar mass or adenopathy. Bony thorax is intact. IMPRESSION: 1. Status post removal of the left chest tube.  No pneumothorax. 2. No acute cardiopulmonary disease. Electronically Signed   By: Lajean Manes M.D.   On: 01/29/2015 08:10   Dg Chest 2 View  01/25/2015  CLINICAL DATA:  Dry cough. EXAM: CHEST  2 VIEW COMPARISON:  12/30/2010 FINDINGS: Mediastinum is normal. Heart size normal. Right suprahilar infiltrate versus mass lesion noted. Prominent left pneumothorax with some degree of tension. Tiny nodular opacities projected over both lungs consistent with nipple shadows, similar findings noted on prior study . Left pleural effusion. No acute bony abnormality. IMPRESSION: 1. Tension left pneumothorax. 2. Right suprahilar infiltrate versus mass. Critical Value/emergent results were called by telephone at the time of interpretation on 01/25/2015 at 2:03 pm to nurse Maudie Mercury , who verbally acknowledged these results. Electronically Signed   By: Marcello Moores  Register   On: 01/25/2015 14:06   Ct Chest W Contrast  01/25/2015  CLINICAL DATA:  Status post chest tube for tension pneumothorax EXAM: CT CHEST WITH CONTRAST TECHNIQUE: Multidetector CT imaging of the chest was performed during intravenous contrast  administration. CONTRAST:  22m OMNIPAQUE IOHEXOL 300 MG/ML  SOLN COMPARISON:  Chest radiographs dated 01/25/2015 FINDINGS: Mediastinum/Nodes: Heart is normal in size. Mild coronary atherosclerosis. 2.0 cm short axis low right paratracheal node (series 2/ image 24). 12 mm short axis left paratracheal node (series 2/image 24). Visualized thyroid is unremarkable. Lungs/Pleura: 3.7 x 3.6 cm spiculated medial right upper lobe mass radiating into the hilar region (series 3/ image 25). Additional 6 mm nodular opacity in the right lung apex (series 3/image 11). Underlying moderate paraseptal emphysematous changes. Subpleural reticulation/fibrosis, predominantly in the left lower lobe and lingula (series 3/image 46). Near complete resolution of prior left pneumothorax status post placement of left chest tube. Trace residual pleural gas along the left fissure inferiorly (series 3/image 50), of no consequence. Upper abdomen: Visualized upper abdomen is notable for tiny probable hepatic cysts measuring up to  5 mm (series 2/ image 55), although technically too small to characterize. Musculoskeletal: No focal osseous lesions. Subcutaneous gas along the left anterior chest wall. IMPRESSION: Prior left pneumothorax has essentially resolved status post placement of left chest tube. 3.7 x 3.6 cm spiculated medial right upper lobe mass extending into the hilar region, suspicious for primary bronchogenic neoplasm. Associated paratracheal nodal metastases, as above. Electronically Signed   By: Julian Hy M.D.   On: 01/25/2015 23:02   Mr Jeri Cos XI Contrast  01/27/2015  CLINICAL DATA:  Persistent cough, lung mass seen on yesterday's CT chest concerning for neoplasm. Assess for intracranial metastasis. History of schizophrenia and alcohol abuse. EXAM: MRI HEAD WITHOUT AND WITH CONTRAST TECHNIQUE: Multiplanar, multiecho pulse sequences of the brain and surrounding structures were obtained without and with intravenous contrast.  CONTRAST:  4m MULTIHANCE GADOBENATE DIMEGLUMINE 529 MG/ML IV SOLN COMPARISON:  None. FINDINGS: Moderate ventriculomegaly on the basis of global parenchymal brain volume loss. No abnormal parenchymal signal, mass lesions, mass effect. No abnormal parenchymal enhancement. Minimal white matter T2 hyperintensities compatible with chronic small vessel ischemic disease, less than expected for age. No reduced diffusion to suggest acute ischemia. No susceptibility artifact to suggest hemorrhage. No abnormal extra-axial fluid collections. No extra-axial masses nor leptomeningeal enhancement. Normal major intracranial vascular flow voids seen at the skull base. Ocular globes and orbital contents are unremarkable though not tailored for evaluation. No suspicious calvarial bone marrow signal. No abnormal sellar expansion. Craniocervical junction maintained. Mild paranasal sinus mucosal thickening. The mastoid air cells are well aerated. Patient is edentulous. IMPRESSION: No acute intracranial process nor MR findings of intracranial metastasis. Moderate global brain volume loss. Electronically Signed   By: CElon AlasM.D.   On: 01/27/2015 00:24   Nm Pet Image Initial (pi) Skull Base To Thigh  02/08/2015  CLINICAL DATA:  Initial treatment strategy for right lung mass. EXAM: NUCLEAR MEDICINE PET SKULL BASE TO THIGH TECHNIQUE: 7.8 mCi F-18 FDG was injected intravenously. Full-ring PET imaging was performed from the skull base to thigh after the radiotracer. CT data was obtained and used for attenuation correction and anatomic localization. FASTING BLOOD GLUCOSE:  Value: 80 mg/dl COMPARISON:  Chest CT 01/25/2015 FINDINGS: NECK No hypermetabolic lymph nodes in the neck. CHEST The large central lung mass invading the right hilum and mediastinum is markedly hypermetabolic with SUV max of 233.8 Weakly positive aorticopulmonary window lymph node measuring 3.5 SUV. No contralateral hilar adenopathy. Mild diffuse activity in  the esophagus is likely normal. Curvilinear soft tissue density in the anterior mediastinum is not hypermetabolic and may represent some type of thymic remnant. Moderate paraseptal emphysema noted in the upper lung zones bilaterally. Right upper lobe scarring change without hypermetabolism. Somewhat linear area of hypermetabolism noted in the left chest wall area. This extends from the scan down to the lower aspect of the left pectoralis muscle. It may be old granulation tissue from previous chest wound. It does not have the appearance of a metastatic focus. ABDOMEN/PELVIS No abnormal hypermetabolic activity within the liver, pancreas, adrenal glands, or spleen. No hypermetabolic lymph nodes in the abdomen or pelvis. SKELETON No focal hypermetabolic activity to suggest skeletal metastasis. IMPRESSION: 1. Markedly hypermetabolic right central upper lobe lung mass invading the right hilum and mediastinum consistent with neoplasm. 2. Hypermetabolic aorticopulmonary window lymph nodes. No contralateral hilar adenopathy. 3. No findings for metastatic disease involving the lungs, abdomen, pelvis or bony structures. 4. Paraseptal emphysema. Electronically Signed   By: PRicky StabsD.  On: 02/08/2015 15:28   Dg Chest Port 1 View  01/28/2015  CLINICAL DATA:  Spontaneous left pneumothorax. Left chest tube in place. EXAM: PORTABLE CHEST - 1 VIEW COMPARISON:  01/26/2015 and earlier, including CT chest 01/25/2015. FINDINGS: Left chest tube in place with no pneumothorax. Central right upper lobe lung mass and right paratracheal lymphadenopathy as noted previously. Lungs otherwise clear. Hyperinflation and emphysematous changes as noted previously. Cardiac silhouette normal in size, unchanged. IMPRESSION: 1. No evidence of pneumothorax with left chest tube in place. 2. Central right upper lobe lung mass and right paratracheal lymphadenopathy as no previously. 3. COPD/emphysema.  No acute cardiopulmonary disease otherwise.  Electronically Signed   By: Evangeline Dakin M.D.   On: 01/28/2015 08:12   Dg Chest Port 1 View  01/26/2015  CLINICAL DATA:  Atelectasis, spontaneous pneumothorax status post left chest tube, lung mass EXAM: PORTABLE CHEST - 1 VIEW COMPARISON:  CT chest dated 01/25/2015 FINDINGS: Left chest tube without visualized pneumothorax. Medial right upper lobe/hilar mass, better evaluated on CT. The heart is normal in size. IMPRESSION: Left chest tube without visualized pneumothorax. Medial right upper lobe/hilar mass, better visualized on CT. Electronically Signed   By: Julian Hy M.D.   On: 01/26/2015 05:04   Dg Chest Port 1 View  01/25/2015  CLINICAL DATA:  Left-sided chest tube inserted. Check tube position. EXAM: PORTABLE CHEST - 1 VIEW COMPARISON:  Nineteen 2016 FINDINGS: Left-sided chest tube has been placed. There has been near complete evacuation of left pneumothorax. Small left basilar pneumothorax persists. The lungs are hyperinflated. There is perihilar peribronchial thickening. Prominence of the right hilar region is again noted and warrants follow-up. IMPRESSION: 1. Interval near complete evacuation of left pneumothorax following tube placement. 2. Persistent right hilar prominence. Electronically Signed   By: Nolon Nations M.D.   On: 01/25/2015 18:42       IMPRESSION:  The patient has been diagnosed with Stage T2,N2, M0 non-small cell lung cancer, stage III. The patient is experiencing symptoms of non-productive cough with shortness of breath upon exertion. The patient would benefit from radiation treatment with adjuvant chemotherapy to the lung mass.    PLAN: We discussed the possible side effects and risks of treatment in addition to the possible benefits of treatment. We discussed the protocol for radiation treatment.  All of the patient's questions were answered. The patient does wish to proceed with this treatment. A simulation will be scheduled such that we can proceed with  treatment planning. I anticipate a 6.5 week course of treatment.    Patient to begin chemotherapy with Dr.Mohamed next Monday, however I will send a note to Dr.Mohamed to push back that start date so that we can begin radiation treatment concurrently with chemotherapy. I will schedule a simulation appointment for this Friday.      ________________________________   Jodelle Gross, MD, PhD   **Disclaimer: This note was dictated with voice recognition software. Similar sounding words can inadvertently be transcribed and this note may contain transcription errors which may not have been corrected upon publication of note.**  This document serves as a record of services personally performed by Kyung Rudd, MD. It was created on his behalf by Derek Mound, a trained medical scribe. The creation of this record is based on the scribe's personal observations and the provider's statements to them. This document has been checked and approved by the attending provider.

## 2015-02-18 ENCOUNTER — Telehealth: Payer: Self-pay | Admitting: *Deleted

## 2015-02-18 NOTE — Telephone Encounter (Signed)
I have adjusted appts 

## 2015-02-19 ENCOUNTER — Ambulatory Visit
Admission: RE | Admit: 2015-02-19 | Discharge: 2015-02-19 | Disposition: A | Discharge status: Home / Self Care | Payer: Medicare Other | Source: Ambulatory Visit | Attending: Radiation Oncology | Admitting: Radiation Oncology

## 2015-02-19 DIAGNOSIS — C3411 Malignant neoplasm of upper lobe, right bronchus or lung: Secondary | ICD-10-CM

## 2015-02-19 DIAGNOSIS — Z510 Encounter for antineoplastic radiation therapy: Secondary | ICD-10-CM | POA: Diagnosis not present

## 2015-02-22 ENCOUNTER — Ambulatory Visit: Payer: Medicare Other

## 2015-02-22 ENCOUNTER — Other Ambulatory Visit: Payer: Medicare Other

## 2015-02-24 DIAGNOSIS — Z510 Encounter for antineoplastic radiation therapy: Secondary | ICD-10-CM | POA: Diagnosis not present

## 2015-03-01 ENCOUNTER — Ambulatory Visit (HOSPITAL_BASED_OUTPATIENT_CLINIC_OR_DEPARTMENT_OTHER): Payer: Medicare Other | Admitting: Internal Medicine

## 2015-03-01 ENCOUNTER — Ambulatory Visit
Admission: RE | Admit: 2015-03-01 | Discharge: 2015-03-01 | Disposition: A | Discharge status: Home / Self Care | Payer: Medicare Other | Source: Ambulatory Visit | Attending: Radiation Oncology | Admitting: Radiation Oncology

## 2015-03-01 ENCOUNTER — Telehealth: Payer: Self-pay | Admitting: *Deleted

## 2015-03-01 ENCOUNTER — Ambulatory Visit: Payer: Medicare Other

## 2015-03-01 ENCOUNTER — Encounter: Payer: Self-pay | Admitting: *Deleted

## 2015-03-01 ENCOUNTER — Encounter: Payer: Self-pay | Admitting: Internal Medicine

## 2015-03-01 ENCOUNTER — Telehealth: Payer: Self-pay | Admitting: Internal Medicine

## 2015-03-01 ENCOUNTER — Other Ambulatory Visit: Payer: Self-pay | Admitting: Internal Medicine

## 2015-03-01 ENCOUNTER — Other Ambulatory Visit (HOSPITAL_BASED_OUTPATIENT_CLINIC_OR_DEPARTMENT_OTHER): Payer: Medicare Other

## 2015-03-01 VITALS — BP 128/60 | HR 101 | Temp 98.3°F | Resp 18 | Ht 71.0 in | Wt 129.5 lb

## 2015-03-01 DIAGNOSIS — F17200 Nicotine dependence, unspecified, uncomplicated: Secondary | ICD-10-CM

## 2015-03-01 DIAGNOSIS — C3490 Malignant neoplasm of unspecified part of unspecified bronchus or lung: Secondary | ICD-10-CM

## 2015-03-01 DIAGNOSIS — R634 Abnormal weight loss: Secondary | ICD-10-CM

## 2015-03-01 DIAGNOSIS — C3411 Malignant neoplasm of upper lobe, right bronchus or lung: Secondary | ICD-10-CM

## 2015-03-01 DIAGNOSIS — Z510 Encounter for antineoplastic radiation therapy: Secondary | ICD-10-CM | POA: Diagnosis not present

## 2015-03-01 LAB — COMPREHENSIVE METABOLIC PANEL (CC13)
ALT: 9 U/L (ref 0–55)
AST: 20 U/L (ref 5–34)
Albumin: 3.9 g/dL (ref 3.5–5.0)
Alkaline Phosphatase: 56 U/L (ref 40–150)
Anion Gap: 7 mEq/L (ref 3–11)
BUN: 9.7 mg/dL (ref 7.0–26.0)
CO2: 29 mEq/L (ref 22–29)
Calcium: 10.2 mg/dL (ref 8.4–10.4)
Chloride: 105 mEq/L (ref 98–109)
Creatinine: 1.1 mg/dL (ref 0.7–1.3)
EGFR: 79 mL/min/{1.73_m2} — ABNORMAL LOW (ref >90)
Glucose: 124 mg/dl (ref 70–140)
Potassium: 4.1 mEq/L (ref 3.5–5.1)
Sodium: 141 mEq/L (ref 136–145)
Total Bilirubin: <0.3 mg/dL (ref 0.20–1.20)
Total Protein: 7.8 g/dL (ref 6.4–8.3)

## 2015-03-01 LAB — CBC WITH DIFFERENTIAL/PLATELET
BASO%: 0.4 % (ref 0.0–2.0)
Basophils Absolute: 0 10*3/uL (ref 0.0–0.1)
EOS%: 1.4 % (ref 0.0–7.0)
Eosinophils Absolute: 0.1 10*3/uL (ref 0.0–0.5)
HCT: 33.6 % — ABNORMAL LOW (ref 38.4–49.9)
HGB: 10.9 g/dL — ABNORMAL LOW (ref 13.0–17.1)
LYMPH%: 24.7 % (ref 14.0–49.0)
MCH: 29.7 pg (ref 27.2–33.4)
MCHC: 32.6 g/dL (ref 32.0–36.0)
MCV: 91.2 fL (ref 79.3–98.0)
MONO#: 0.4 10*3/uL (ref 0.1–0.9)
MONO%: 5.6 % (ref 0.0–14.0)
NEUT#: 4.6 10*3/uL (ref 1.5–6.5)
NEUT%: 67.9 % (ref 39.0–75.0)
Platelets: 305 10*3/uL (ref 140–400)
RBC: 3.68 10*6/uL — ABNORMAL LOW (ref 4.20–5.82)
RDW: 16.3 % — ABNORMAL HIGH (ref 11.0–14.6)
WBC: 6.8 10*3/uL (ref 4.0–10.3)
lymph#: 1.7 10*3/uL (ref 0.9–3.3)

## 2015-03-01 NOTE — Progress Notes (Signed)
Pt paged several times for treatment and called home phone number. I also went to lobby and called for him. Message left at home for pt to come tomorrow

## 2015-03-01 NOTE — Progress Notes (Signed)
Oncology Nurse Navigator Documentation  Oncology Nurse Navigator Flowsheets 03/01/2015  Navigator Encounter Type Clinic/MDC/spoke to patient and wife today at clinic.  Patient is still smoking.  I spoke to him about smoking cessation.  I gave him tips to help quit and educational materials.    Patient Visit Type Follow-up  Treatment Phase Treatment  Barriers/Navigation Needs Education  Education Smoking cessation  Interventions Other  Coordination of Care -  Time Spent with Patient 30

## 2015-03-01 NOTE — Telephone Encounter (Signed)
Per staff message and POF I have scheduled appts. Advised scheduler of appts. JMW  

## 2015-03-01 NOTE — Progress Notes (Signed)
[place] [phone]   [phone]  OFFICE PROGRESS NOTE  Ivan Greenland, MD [address] [address]  DIAGNOSIS: Stage IIIA (T2a, N2, M0) non-small cell lung cancer consistent with invasive squamous cell carcinoma presented with right upper lobe lung mass in addition to right hilar and mediastinal lymphadenopathy diagnosed in September 2016.  PRIOR THERAPY: None.  CURRENT THERAPY: Course of concurrent chemoradiation with weekly carboplatin for AUC of 2 and paclitaxel 45 MG/M2. First dose 03/01/2015.  INTERVAL HISTORY: Ivan Daniels 69 y.o. male returns to the clinic today for follow-up visit accompanied by his wife. The patient is feeling fine today was no specific complaints. He denied having any significant fever or chills. He has no nausea or vomiting. He continues to smoke a few cigarettes every day. The patient denied having any significant chest pain and has improvement in his cough. He has no hemoptysis. His Lexapro to start the first dose of concurrent chemoradiation.  MEDICAL HISTORY: Past Medical History  Diagnosis Date  . Hyperlipidemia   . TOBACCO ABUSE 05/24/2009    Qualifier: Diagnosis of  By: Hassell Done FNP, Tori Milks    . Paranoid schizophrenia ([place]) 10/31/2009    Qualifier: Diagnosis of  By: Jorene Minors, [place]    . BENIGN PROSTATIC HYPERTROPHY, HX OF 12/19/2006    Qualifier: Diagnosis of  By: Radene Ou MD, [place]    . Primary spontaneous pneumothorax, left 01/25/2015  . Lung mass 01/25/2015    3.7 x 3.6 cm RUL spiculated lung mass with 2.0 cm right paratracheal lymph node suspicious for bronchogenic CA  . Full code status 02/10/2015  . Allergy   . ALCOHOL ABUSE 11/12/2009    Qualifier: Diagnosis of  By: Hassell Done FNP, Tori Milks    . Non-small cell lung cancer ([place]) 01/27/15    non small-cell,squamous cell ca RUL    ALLERGIES:  is allergic to benztropine mesylate and chlorpromazine hcl.  MEDICATIONS:    Current Outpatient Prescriptions  Medication Sig Dispense Refill  . aspirin 120 MG suppository Place 120 mg rectally every 6 (six) hours as needed for fever.    . benztropine (COGENTIN) 0.5 MG tablet Take 0.5 mg by mouth at bedtime.     . clopidogrel (PLAVIX) 75 MG tablet     . ferrous fumarate (HEMOCYTE - 106 MG FE) 325 (106 FE) MG TABS tablet Take 1 tablet by mouth.    . haloperidol (HALDOL) 5 MG tablet Take 5 mg by mouth at bedtime.     . Multiple Vitamins-Minerals (MULTIVITAMIN WITH MINERALS) tablet Take 1 tablet by mouth daily.    Marland Kitchen oxyCODONE (OXY IR/ROXICODONE) 5 MG immediate release tablet Take 1 tablet (5 mg total) by mouth every 3 (three) hours as needed for moderate pain. 30 tablet 0  . oxyCODONE 10 MG TABS Take 1 tablet (10 mg total) by mouth every 3 (three) hours as needed for severe pain. 30 tablet 0  . prochlorperazine (COMPAZINE) 10 MG tablet Take 1 tablet (10 mg total) by mouth every 6 (six) hours as needed for nausea or vomiting. 30 tablet 0  . simvastatin (ZOCOR) 20 MG tablet      No current facility-administered medications for this visit.    SURGICAL HISTORY:  Past Surgical History  Procedure Laterality Date  . Video bronchoscopy with endobronchial ultrasound N/A 01/27/2015    Procedure: VIDEO BRONCHOSCOPY WITH ENDOBRONCHIAL ULTRASOUND;  Surgeon: Grace Isaac, MD;  Location: [facility];  Service: Thoracic;  Laterality: N/A;  .  Lung biopsy Right 01/27/2015    Procedure: Right Upper Lobe Bronchus BIOPSY;  Surgeon: Grace Isaac, MD;  Location: [facility];  Service: Thoracic;  Laterality: Right;    REVIEW OF SYSTEMS:  A comprehensive review of systems was negative except for: Constitutional: positive for fatigue and weight loss Respiratory: positive for cough and dyspnea on exertion Behavioral/Psych: positive for tobacco use   PHYSICAL EXAMINATION: General appearance: alert, cooperative and no distress Head: Normocephalic, without obvious abnormality, atraumatic Neck:  no adenopathy, no JVD, supple, symmetrical, trachea midline and thyroid not enlarged, symmetric, no tenderness/mass/nodules Lymph nodes: Cervical, supraclavicular, and axillary nodes normal. Resp: clear to auscultation bilaterally Back: symmetric, no curvature. ROM normal. No CVA tenderness. Cardio: regular rate and rhythm, S1, S2 normal, no murmur, click, rub or gallop GI: soft, non-tender; bowel sounds normal; no masses,  no organomegaly Extremities: extremities normal, atraumatic, no cyanosis or edema  ECOG PERFORMANCE STATUS: 1 - Symptomatic but completely ambulatory  Blood pressure 128/60, pulse 101, temperature 98.3 F (36.8 C), temperature source Oral, resp. rate 18, height '5\' 11"'$  (1.803 m), weight 129 lb 8 oz (58.741 kg), SpO2 100 %.  LABORATORY DATA: Lab Results  Component Value Date   WBC 6.2 02/10/2015   HGB 11.9* 02/10/2015   HCT 35.3* 02/10/2015   MCV 89.1 02/10/2015   PLT 314 02/10/2015      Chemistry      Component Value Date/Time   NA 141 02/10/2015 1304   NA 138 01/26/2015 1454   K 4.6 02/10/2015 1304   K 3.7 01/26/2015 1454   CL 101 01/26/2015 1454   CO2 30* 02/10/2015 1304   CO2 28 01/26/2015 1454   BUN 8.0 02/10/2015 1304   BUN 9 01/26/2015 1454   CREATININE 1.0 02/10/2015 1304   CREATININE 0.95 01/26/2015 1454      Component Value Date/Time   CALCIUM 9.8 02/10/2015 1304   CALCIUM 9.1 01/26/2015 1454   ALKPHOS 55 02/10/2015 1304   ALKPHOS 52 01/26/2015 1454   AST 19 02/10/2015 1304   AST 21 01/26/2015 1454   ALT 10 02/10/2015 1304   ALT 11* 01/26/2015 1454   BILITOT <0.30 02/10/2015 1304   BILITOT <0.1* 01/26/2015 1454       RADIOGRAPHIC STUDIES: Dg Chest 2 View  02/03/2015  CLINICAL DATA:  Recent bronchoscopy and biopsy.  Pneumothorax. EXAM: CHEST  2 VIEW COMPARISON:  01/29/2015 FINDINGS: Cardiac silhouette is within normal limits. Right suprahilar mass is unchanged. No airspace consolidation, edema, pleural effusion, or pneumothorax is  identified. No acute osseous abnormality is identified. IMPRESSION: No pneumothorax.  Unchanged right suprahilar mass. Electronically Signed   By: Logan Bores M.D.   On: 02/03/2015 11:26   Nm Pet Image Initial (pi) Skull Base To Thigh  02/08/2015  CLINICAL DATA:  Initial treatment strategy for right lung mass. EXAM: NUCLEAR MEDICINE PET SKULL BASE TO THIGH TECHNIQUE: 7.8 mCi F-18 FDG was injected intravenously. Full-ring PET imaging was performed from the skull base to thigh after the radiotracer. CT data was obtained and used for attenuation correction and anatomic localization. FASTING BLOOD GLUCOSE:  Value: 80 mg/dl COMPARISON:  Chest CT 01/25/2015 FINDINGS: NECK No hypermetabolic lymph nodes in the neck. CHEST The large central lung mass invading the right hilum and mediastinum is markedly hypermetabolic with SUV max of 97.3. Weakly positive aorticopulmonary window lymph node measuring 3.5 SUV. No contralateral hilar adenopathy. Mild diffuse activity in the esophagus is likely normal. Curvilinear soft tissue density in the anterior mediastinum is not  hypermetabolic and may represent some type of thymic remnant. Moderate paraseptal emphysema noted in the upper lung zones bilaterally. Right upper lobe scarring change without hypermetabolism. Somewhat linear area of hypermetabolism noted in the left chest wall area. This extends from the scan down to the lower aspect of the left pectoralis muscle. It may be old granulation tissue from previous chest wound. It does not have the appearance of a metastatic focus. ABDOMEN/PELVIS No abnormal hypermetabolic activity within the liver, pancreas, adrenal glands, or spleen. No hypermetabolic lymph nodes in the abdomen or pelvis. SKELETON No focal hypermetabolic activity to suggest skeletal metastasis. IMPRESSION: 1. Markedly hypermetabolic right central upper lobe lung mass invading the right hilum and mediastinum consistent with neoplasm. 2. Hypermetabolic  aorticopulmonary window lymph nodes. No contralateral hilar adenopathy. 3. No findings for metastatic disease involving the lungs, abdomen, pelvis or bony structures. 4. Paraseptal emphysema. Electronically Signed   By: Marijo Sanes M.D.   On: 02/08/2015 15:28    ASSESSMENT AND PLAN: This is a very pleasant 69 years old African-American male with a stage IIIa non-small cell lung cancer and he is here today to start the first cycle of concurrent chemoradiation with weekly carboplatin and paclitaxel. The patient is feeling fine today with no specific complaints. We will proceed with the first cycle of his treatment as a scheduled. For smoke cessation, I strongly advised the patient to quit smoking and offered him a smoking cessation program. He will be seen by the thoracic navigator for smoke cessation counseling. For the weight loss and malnutrition, the patient will have an appointment with the dietitian at the [facility] for evaluation of his condition. He would come back for follow-up visit in 2 weeks for reevaluation and management of any adverse effect of his treatment. The patient was advised to call immediately if he has any concerning symptoms in the interval. The patient voices understanding of current disease status and treatment options and is in agreement with the current care plan.  All questions were answered. The patient knows to call the clinic with any problems, questions or concerns. We can certainly see the patient much sooner if necessary.  I spent 15 minutes counseling the patient face to face. The total time spent in the appointment was 25 minutes.  Disclaimer: This note was dictated with voice recognition software. Similar sounding words can inadvertently be transcribed and may not be corrected upon review.

## 2015-03-01 NOTE — Telephone Encounter (Signed)
per pof to sch pt appt-gave pt copy of avs-sent MW email to sch trmt-pt to pickup sch 10/25 after radiation

## 2015-03-01 NOTE — Telephone Encounter (Signed)
Per charge RN in chemo I have scheduled appts from today to tomorrow. I have called and left patient a message on his machine.

## 2015-03-02 ENCOUNTER — Telehealth: Payer: Self-pay | Admitting: *Deleted

## 2015-03-02 ENCOUNTER — Ambulatory Visit (HOSPITAL_BASED_OUTPATIENT_CLINIC_OR_DEPARTMENT_OTHER): Payer: Medicare Other

## 2015-03-02 ENCOUNTER — Ambulatory Visit: Payer: Medicare Other | Admitting: Nutrition

## 2015-03-02 ENCOUNTER — Ambulatory Visit
Admission: RE | Admit: 2015-03-02 | Discharge: 2015-03-02 | Disposition: A | Discharge status: Home / Self Care | Payer: Medicare Other | Source: Ambulatory Visit | Attending: Radiation Oncology | Admitting: Radiation Oncology

## 2015-03-02 VITALS — BP 139/55 | HR 73 | Temp 98.6°F | Resp 16

## 2015-03-02 DIAGNOSIS — Z510 Encounter for antineoplastic radiation therapy: Secondary | ICD-10-CM | POA: Diagnosis not present

## 2015-03-02 DIAGNOSIS — C3491 Malignant neoplasm of unspecified part of right bronchus or lung: Secondary | ICD-10-CM

## 2015-03-02 DIAGNOSIS — Z5111 Encounter for antineoplastic chemotherapy: Secondary | ICD-10-CM | POA: Diagnosis present

## 2015-03-02 DIAGNOSIS — C3411 Malignant neoplasm of upper lobe, right bronchus or lung: Secondary | ICD-10-CM

## 2015-03-02 MED ORDER — FAMOTIDINE IN NACL 20-0.9 MG/50ML-% IV SOLN
20.0000 mg | Freq: Once | INTRAVENOUS | Status: AC
  Administered 2015-03-02: 20 mg via INTRAVENOUS

## 2015-03-02 MED ORDER — SODIUM CHLORIDE 0.9 % IV SOLN
Freq: Once | INTRAVENOUS | Status: AC
  Administered 2015-03-02: 13:00:00 via INTRAVENOUS

## 2015-03-02 MED ORDER — FAMOTIDINE IN NACL 20-0.9 MG/50ML-% IV SOLN
INTRAVENOUS | Status: AC
  Filled 2015-03-02: qty 50

## 2015-03-02 MED ORDER — PACLITAXEL CHEMO INJECTION 300 MG/50ML
45.0000 mg/m2 | Freq: Once | INTRAVENOUS | Status: AC
  Administered 2015-03-02: 78 mg via INTRAVENOUS
  Filled 2015-03-02: qty 13

## 2015-03-02 MED ORDER — DEXAMETHASONE SODIUM PHOSPHATE 100 MG/10ML IJ SOLN
Freq: Once | INTRAMUSCULAR | Status: AC
  Administered 2015-03-02: 13:00:00 via INTRAVENOUS
  Filled 2015-03-02: qty 8

## 2015-03-02 MED ORDER — DIPHENHYDRAMINE HCL 50 MG/ML IJ SOLN
50.0000 mg | Freq: Once | INTRAMUSCULAR | Status: AC
  Administered 2015-03-02: 50 mg via INTRAVENOUS

## 2015-03-02 MED ORDER — SODIUM CHLORIDE 0.9 % IV SOLN
168.6000 mg | Freq: Once | INTRAVENOUS | Status: AC
  Administered 2015-03-02: 170 mg via INTRAVENOUS
  Filled 2015-03-02: qty 17

## 2015-03-02 MED ORDER — DIPHENHYDRAMINE HCL 50 MG/ML IJ SOLN
INTRAMUSCULAR | Status: AC
  Filled 2015-03-02: qty 1

## 2015-03-02 NOTE — Progress Notes (Signed)
69 year old male diagnosed with lung cancer receiving chemotherapy and radiation treatment.  He is a patient of Dr. Julien Nordmann.  Past medical history includes hyperlipidemia, tobacco, paranoid schizophrenia, alcohol usage.  Labs were reviewed.  Medications include Haldol, multivitamin, Zocor, Compazine.  Height: 5 feet 11 inches. Weight: 129 pounds. Usual body weight: 120-130 pounds. BMI: 18.07.  Spoke with patient and sister, during chemotherapy. Patient reports he has never weighed a lot. He was told by physician to drink Ensure Plus 3 times a day. Patient's sister reports they have difficulty affording nutrition supplements. Patient states he tolerates milk.  Nutrition diagnosis: Food and nutrition related knowledge deficit related to lung cancer and associated treatments as evidenced by no prior need for nutrition related information.  Intervention:  Provided education on strategies for increasing calories and protein. Recommended small meals and snacks. Recommended patient continue oral nutrition supplements and provided samples and coupons. Questions were answered.  Teach back method was used.  Contact information was given.  Monitoring, evaluation, goals: Patient will tolerate adequate calories and protein to minimize weight loss throughout treatment.  Next visit: Monday, October 31 during infusion.  **Disclaimer: This note was dictated with voice recognition software. Similar sounding words can inadvertently be transcribed and this note may contain transcription errors which may not have been corrected upon publication of note.**

## 2015-03-02 NOTE — Telephone Encounter (Signed)
I have adjusted appts 

## 2015-03-02 NOTE — Patient Instructions (Addendum)
[facility] Discharge Instructions for Patients Receiving Chemotherapy  Today you received the following chemotherapy agents Taxol Carboplatin  To help prevent nausea and vomiting after your treatment, we encourage you to take your nausea medication as needed   If you develop nausea and vomiting that is not controlled by your nausea medication, call the clinic.   BELOW ARE SYMPTOMS THAT SHOULD BE REPORTED IMMEDIATELY:  *FEVER GREATER THAN 100.5 F  *CHILLS WITH OR WITHOUT FEVER  NAUSEA AND VOMITING THAT IS NOT CONTROLLED WITH YOUR NAUSEA MEDICATION  *UNUSUAL SHORTNESS OF BREATH  *UNUSUAL BRUISING OR BLEEDING  TENDERNESS IN MOUTH AND THROAT WITH OR WITHOUT PRESENCE OF ULCERS  *URINARY PROBLEMS  *BOWEL PROBLEMS  UNUSUAL RASH Items with * indicate a potential emergency and should be followed up as soon as possible.  Feel free to call the clinic you have any questions or concerns. The clinic phone number is [phone].  Please show the [facility] at check-in to the Emergency Department and triage nurse.  Paclitaxel injection What is this medicine? PACLITAXEL (PAK li TAX el) is a chemotherapy drug. It targets fast dividing cells, like cancer cells, and causes these cells to die. This medicine is used to treat ovarian cancer, breast cancer, and other cancers. This medicine may be used for other purposes; ask your health care provider or pharmacist if you have questions. What should I tell my health care provider before I take this medicine? They need to know if you have any of these conditions: -blood disorders -irregular heartbeat -infection (especially a virus infection such as chickenpox, cold sores, or herpes) -liver disease -previous or ongoing radiation therapy -an unusual or allergic reaction to paclitaxel, alcohol, polyoxyethylated castor oil, other chemotherapy agents, other medicines, foods, dyes, or preservatives -pregnant or trying to get  pregnant -breast-feeding How should I use this medicine? This drug is given as an infusion into a vein. It is administered in a hospital or clinic by a specially trained health care professional. Talk to your pediatrician regarding the use of this medicine in children. Special care may be needed. Overdosage: If you think you have taken too much of this medicine contact a poison control center or emergency room at once. NOTE: This medicine is only for you. Do not share this medicine with others. What if I miss a dose? It is important not to miss your dose. Call your doctor or health care professional if you are unable to keep an appointment. What may interact with this medicine? Do not take this medicine with any of the following medications: -disulfiram -metronidazole This medicine may also interact with the following medications: -cyclosporine -diazepam -ketoconazole -medicines to increase blood counts like filgrastim, pegfilgrastim, sargramostim -other chemotherapy drugs like cisplatin, doxorubicin, epirubicin, etoposide, teniposide, vincristine -quinidine -testosterone -vaccines -verapamil Talk to your doctor or health care professional before taking any of these medicines: -acetaminophen -aspirin -ibuprofen -ketoprofen -naproxen This list may not describe all possible interactions. Give your health care provider a list of all the medicines, herbs, non-prescription drugs, or dietary supplements you use. Also tell them if you smoke, drink alcohol, or use illegal drugs. Some items may interact with your medicine. What should I watch for while using this medicine? Your condition will be monitored carefully while you are receiving this medicine. You will need important blood work done while you are taking this medicine. This drug may make you feel generally unwell. This is not uncommon, as chemotherapy can affect healthy cells as well as  cancer cells. Report any side effects. Continue  your course of treatment even though you feel ill unless your doctor tells you to stop. This medicine can cause serious allergic reactions. To reduce your risk you will need to take other medicine(s) before treatment with this medicine. In some cases, you may be given additional medicines to help with side effects. Follow all directions for their use. Call your doctor or health care professional for advice if you get a fever, chills or sore throat, or other symptoms of a cold or flu. Do not treat yourself. This drug decreases your body's ability to fight infections. Try to avoid being around people who are sick. This medicine may increase your risk to bruise or bleed. Call your doctor or health care professional if you notice any unusual bleeding. Be careful brushing and flossing your teeth or using a toothpick because you may get an infection or bleed more easily. If you have any dental work done, tell your dentist you are receiving this medicine. Avoid taking products that contain aspirin, acetaminophen, ibuprofen, naproxen, or ketoprofen unless instructed by your doctor. These medicines may hide a fever. Do not become pregnant while taking this medicine. Women should inform their doctor if they wish to become pregnant or think they might be pregnant. There is a potential for serious side effects to an unborn child. Talk to your health care professional or pharmacist for more information. Do not breast-feed an infant while taking this medicine. Men are advised not to father a child while receiving this medicine. This product may contain alcohol. Ask your pharmacist or healthcare provider if this medicine contains alcohol. Be sure to tell all healthcare providers you are taking this medicine. Certain medicines, like metronidazole and disulfiram, can cause an unpleasant reaction when taken with alcohol. The reaction includes flushing, headache, nausea, vomiting, sweating, and increased thirst. The reaction  can last from 30 minutes to several hours. What side effects may I notice from receiving this medicine? Side effects that you should report to your doctor or health care professional as soon as possible: -allergic reactions like skin rash, itching or hives, swelling of the face, lips, or tongue -low blood counts - This drug may decrease the number of white blood cells, red blood cells and platelets. You may be at increased risk for infections and bleeding. -signs of infection - fever or chills, cough, sore throat, pain or difficulty passing urine -signs of decreased platelets or bleeding - bruising, pinpoint red spots on the skin, black, tarry stools, nosebleeds -signs of decreased red blood cells - unusually weak or tired, fainting spells, lightheadedness -breathing problems -chest pain -high or low blood pressure -mouth sores -nausea and vomiting -pain, swelling, redness or irritation at the injection site -pain, tingling, numbness in the hands or feet -slow or irregular heartbeat -swelling of the ankle, feet, hands Side effects that usually do not require medical attention (report to your doctor or health care professional if they continue or are bothersome): -bone pain -complete hair loss including hair on your head, underarms, pubic hair, eyebrows, and eyelashes -changes in the color of fingernails -diarrhea -loosening of the fingernails -loss of appetite -muscle or joint pain -red flush to skin -sweating This list may not describe all possible side effects. Call your doctor for medical advice about side effects. You may report side effects to FDA at 1-800-FDA-1088. Where should I keep my medicine? This drug is given in a hospital or clinic and will not be stored at home.  NOTE: This sheet is a summary. It may not cover all possible information. If you have questions about this medicine, talk to your doctor, pharmacist, or health care provider.    2016, [employer] Standard.  (2014-12-10 13:02:56)  Carboplatin injection What is this medicine? CARBOPLATIN (KAR boe pla tin) is a chemotherapy drug. It targets fast dividing cells, like cancer cells, and causes these cells to die. This medicine is used to treat ovarian cancer and many other cancers. This medicine may be used for other purposes; ask your health care provider or pharmacist if you have questions. What should I tell my health care provider before I take this medicine? They need to know if you have any of these conditions: -blood disorders -hearing problems -kidney disease -recent or ongoing radiation therapy -an unusual or allergic reaction to carboplatin, cisplatin, other chemotherapy, other medicines, foods, dyes, or preservatives -pregnant or trying to get pregnant -breast-feeding How should I use this medicine? This drug is usually given as an infusion into a vein. It is administered in a hospital or clinic by a specially trained health care professional. Talk to your pediatrician regarding the use of this medicine in children. Special care may be needed. Overdosage: If you think you have taken too much of this medicine contact a poison control center or emergency room at once. NOTE: This medicine is only for you. Do not share this medicine with others. What if I miss a dose? It is important not to miss a dose. Call your doctor or health care professional if you are unable to keep an appointment. What may interact with this medicine? -medicines for seizures -medicines to increase blood counts like filgrastim, pegfilgrastim, sargramostim -some antibiotics like amikacin, gentamicin, neomycin, streptomycin, tobramycin -vaccines Talk to your doctor or health care professional before taking any of these medicines: -acetaminophen -aspirin -ibuprofen -ketoprofen -naproxen This list may not describe all possible interactions. Give your health care provider a list of all the medicines, herbs,  non-prescription drugs, or dietary supplements you use. Also tell them if you smoke, drink alcohol, or use illegal drugs. Some items may interact with your medicine. What should I watch for while using this medicine? Your condition will be monitored carefully while you are receiving this medicine. You will need important blood work done while you are taking this medicine. This drug may make you feel generally unwell. This is not uncommon, as chemotherapy can affect healthy cells as well as cancer cells. Report any side effects. Continue your course of treatment even though you feel ill unless your doctor tells you to stop. In some cases, you may be given additional medicines to help with side effects. Follow all directions for their use. Call your doctor or health care professional for advice if you get a fever, chills or sore throat, or other symptoms of a cold or flu. Do not treat yourself. This drug decreases your body's ability to fight infections. Try to avoid being around people who are sick. This medicine may increase your risk to bruise or bleed. Call your doctor or health care professional if you notice any unusual bleeding. Be careful brushing and flossing your teeth or using a toothpick because you may get an infection or bleed more easily. If you have any dental work done, tell your dentist you are receiving this medicine. Avoid taking products that contain aspirin, acetaminophen, ibuprofen, naproxen, or ketoprofen unless instructed by your doctor. These medicines may hide a fever. Do not become pregnant while taking this medicine. Women  should inform their doctor if they wish to become pregnant or think they might be pregnant. There is a potential for serious side effects to an unborn child. Talk to your health care professional or pharmacist for more information. Do not breast-feed an infant while taking this medicine. What side effects may I notice from receiving this medicine? Side effects  that you should report to your doctor or health care professional as soon as possible: -allergic reactions like skin rash, itching or hives, swelling of the face, lips, or tongue -signs of infection - fever or chills, cough, sore throat, pain or difficulty passing urine -signs of decreased platelets or bleeding - bruising, pinpoint red spots on the skin, black, tarry stools, nosebleeds -signs of decreased red blood cells - unusually weak or tired, fainting spells, lightheadedness -breathing problems -changes in hearing -changes in vision -chest pain -high blood pressure -low blood counts - This drug may decrease the number of white blood cells, red blood cells and platelets. You may be at increased risk for infections and bleeding. -nausea and vomiting -pain, swelling, redness or irritation at the injection site -pain, tingling, numbness in the hands or feet -problems with balance, talking, walking -trouble passing urine or change in the amount of urine Side effects that usually do not require medical attention (report to your doctor or health care professional if they continue or are bothersome): -hair loss -loss of appetite -metallic taste in the mouth or changes in taste This list may not describe all possible side effects. Call your doctor for medical advice about side effects. You may report side effects to FDA at 1-800-FDA-1088. Where should I keep my medicine? This drug is given in a hospital or clinic and will not be stored at home. NOTE: This sheet is a summary. It may not cover all possible information. If you have questions about this medicine, talk to your doctor, pharmacist, or health care provider.    2016, [employer] Standard. (2007-07-30 14:38:05)

## 2015-03-03 ENCOUNTER — Ambulatory Visit
Admission: RE | Admit: 2015-03-03 | Discharge: 2015-03-03 | Disposition: A | Discharge status: Home / Self Care | Payer: Medicare Other | Source: Ambulatory Visit | Attending: Radiation Oncology | Admitting: Radiation Oncology

## 2015-03-03 DIAGNOSIS — Z510 Encounter for antineoplastic radiation therapy: Secondary | ICD-10-CM | POA: Diagnosis not present

## 2015-03-04 ENCOUNTER — Ambulatory Visit
Admission: RE | Admit: 2015-03-04 | Discharge: 2015-03-04 | Disposition: A | Discharge status: Home / Self Care | Payer: Medicare Other | Source: Ambulatory Visit | Attending: Radiation Oncology | Admitting: Radiation Oncology

## 2015-03-04 DIAGNOSIS — Z510 Encounter for antineoplastic radiation therapy: Secondary | ICD-10-CM | POA: Diagnosis not present

## 2015-03-05 ENCOUNTER — Ambulatory Visit
Admission: RE | Admit: 2015-03-05 | Discharge: 2015-03-05 | Disposition: A | Discharge status: Home / Self Care | Payer: Medicare Other | Source: Ambulatory Visit | Attending: Radiation Oncology | Admitting: Radiation Oncology

## 2015-03-05 ENCOUNTER — Ambulatory Visit: Admission: RE | Admit: 2015-03-05 | Payer: Medicare Other | Source: Ambulatory Visit | Admitting: Radiation Oncology

## 2015-03-05 ENCOUNTER — Encounter: Payer: Self-pay | Admitting: Radiation Oncology

## 2015-03-05 VITALS — BP 111/53 | HR 115 | Temp 98.6°F | Resp 20 | Wt 131.9 lb

## 2015-03-05 DIAGNOSIS — C3411 Malignant neoplasm of upper lobe, right bronchus or lung: Secondary | ICD-10-CM

## 2015-03-05 DIAGNOSIS — Z510 Encounter for antineoplastic radiation therapy: Secondary | ICD-10-CM | POA: Diagnosis not present

## 2015-03-05 MED ORDER — SONAFINE EX EMUL
1.0000 "application " | Freq: Two times a day (BID) | CUTANEOUS | Status: DC
  Administered 2015-03-05: 1 via TOPICAL
  Filled 2015-03-05: qty 45

## 2015-03-05 NOTE — Progress Notes (Signed)
Department of Radiation Oncology  Phone:  [phone] Fax:        [phone]  Weekly Treatment Note    Name: Ivan Daniels Date: 03/05/2015 MRN: [id number] DOB: May 24, 1945   Current dose: 10 Gy  Current fraction: 5   MEDICATIONS: Current Outpatient Prescriptions  Medication Sig Dispense Refill  . benztropine (COGENTIN) 0.5 MG tablet Take 0.5 mg by mouth at bedtime.     . ferrous fumarate (HEMOCYTE - 106 MG FE) 325 (106 FE) MG TABS tablet Take 1 tablet by mouth.    . haloperidol (HALDOL) 5 MG tablet Take 5 mg by mouth at bedtime.     . Multiple Vitamins-Minerals (MULTIVITAMIN WITH MINERALS) tablet Take 1 tablet by mouth daily.    . simvastatin (ZOCOR) 20 MG tablet     . aspirin 120 MG suppository Place 120 mg rectally every 6 (six) hours as needed for fever.    . clopidogrel (PLAVIX) 75 MG tablet     . oxyCODONE (OXY IR/ROXICODONE) 5 MG immediate release tablet Take 1 tablet (5 mg total) by mouth every 3 (three) hours as needed for moderate pain. (Patient not taking: Reported on 03/05/2015) 30 tablet 0  . oxyCODONE 10 MG TABS Take 1 tablet (10 mg total) by mouth every 3 (three) hours as needed for severe pain. (Patient not taking: Reported on 03/05/2015) 30 tablet 0  . prochlorperazine (COMPAZINE) 10 MG tablet Take 1 tablet (10 mg total) by mouth every 6 (six) hours as needed for nausea or vomiting. (Patient not taking: Reported on 03/05/2015) 30 tablet 0   Current Facility-Administered Medications  Medication Dose Route Frequency Provider Last Rate Last Dose  . SONAFINE emulsion 1 application  1 application Topical BID Kyung Rudd, MD   1 application at 38/25/05 1550     ALLERGIES: Benztropine mesylate and Chlorpromazine hcl   LABORATORY DATA:  Lab Results  Component Value Date   WBC 6.8 03/01/2015   HGB 10.9* 03/01/2015   HCT 33.6* 03/01/2015   MCV 91.2 03/01/2015   PLT 305 03/01/2015   Lab Results  Component Value Date   NA 141 03/01/2015   K 4.1  03/01/2015   CL 101 01/26/2015   CO2 29 03/01/2015   Lab Results  Component Value Date   ALT 9 03/01/2015   AST 20 03/01/2015   ALKPHOS 56 03/01/2015   BILITOT <0.30 03/01/2015     NARRATIVE: Ivan Daniels was seen today for weekly treatment management. The chart was checked and the patient's films were reviewed.  Weekly rad txs right lung 5/33 completed, no skin changes, no nausea, or swallowing difficulty,no pain, is coughin some clear phelgm at times, appetite good, Pt education done, sonafine cream , radiation therapy and you book, my business card given, discussed ways to manage side defects, weight loss, loss appetite, skin irritation, fatigue, throat changes, difficulty swallowing, , increas protein in diet,  May need to start softer foods and eat 5-6 smaller meals and snacks, use sonafine cream once skin is irritated or itchiness, daily after rad txsatient verbal understanding, teach back given, BP 111/53 mmHg  Pulse 115  Temp(Src) 98.6 F (37 C) (Oral)  Resp 20  Wt 131 lb 14.4 oz (59.829 kg)  SpO2 100%  Wt Readings from Last 3 Encounters:  03/05/15 131 lb 14.4 oz (59.829 kg)  03/01/15 129 lb 8 oz (58.741 kg)  02/17/15 131 lb (59.421 kg)    PHYSICAL EXAMINATION: weight is 131 lb 14.4 oz (59.829 kg). His oral  temperature is 98.6 F (37 C). His blood pressure is 111/53 and his pulse is 115. His respiration is 20 and oxygen saturation is 100%.        ASSESSMENT: The patient is doing satisfactorily with treatment.  PLAN: We will continue with the patient's radiation treatment as planned.

## 2015-03-05 NOTE — Progress Notes (Signed)
  Radiation Oncology         [phone] ________________________________  Name: Ivan Daniels MRN: [id number]  Date: 02/19/2015  DOB: 08-13-1945  SIMULATION AND TREATMENT PLANNING NOTE  DIAGNOSIS:  Lung cancer  NARRATIVE:  The patient was brought to the [facility].  Identity was confirmed.  All relevant records and images related to the planned course of therapy were reviewed.   Written consent to proceed with treatment was confirmed which was freely given after reviewing the details related to the planned course of therapy had been reviewed with the patient.  Then, the patient was set-up in a stable reproducible supine position for radiation therapy.  The patient's arms were raised above using a wing board device. CT images were obtained.  An isocenter was placed within the chest in relation to the target volume. Surface markings were placed.    The CT images were loaded into the planning software.  Then the target and avoidance structures were contoured.  Treatment planning then occurred.  The radiation prescription was entered and confirmed.  A total of 5 complex treatment devices were fabricated which correspond to the designed customized radiation treatment fields. Each of these customized fields/ complex treatment devices will be used on a daily basis during the radiation course. I have requested a 3D Simulation.  I have requested a DVH of the following structures: target volume, spinal cord, lungs, esophagus.   The patient will undergo daily image guidance to ensure accurate localization of the target, and adequate minimize dose to the normal surrounding structures in close proximity to the target.   PLAN:  The patient will initially receive 60 Gy in 30 fractions. It is anticipated that the patient will then received a 6 gray boost. The patient's final total dose will be 66 gray.   Special treatment procedure The patient will receive chemotherapy during the course  of radiation treatment. The patient may experience increased or overlapping toxicity due to this combined-modality approach and the patient will be monitored for such problems. This may include extra lab work as necessary. This therefore constitutes a special treatment procedure.   ________________________________   Jodelle Gross, MD, PhD

## 2015-03-05 NOTE — Progress Notes (Signed)
Weekly rad txs right lung 5/33 completed, no skin changes, no nausea, or swallowing difficulty,no pain, is coughin some clear phelgm at times, appetite good, Pt education done, sonafine cream , radiation therapy and you book, my business card given, discussed ways to manage side defects, weight loss, loss appetite, skin irritation, fatigue, throat changes, difficulty swallowing, , increas protein in diet,  May need to start softer foods and eat 5-6 smaller meals and snacks, use sonafine cream once skin is irritated or itchiness, daily after rad txsatient verbal understanding, teach back given, BP 111/53 mmHg  Pulse 115  Temp(Src) 98.6 F (37 C) (Oral)  Resp 20  Wt 131 lb 14.4 oz (59.829 kg)  SpO2 100%  Wt Readings from Last 3 Encounters:  03/05/15 131 lb 14.4 oz (59.829 kg)  03/01/15 129 lb 8 oz (58.741 kg)  02/17/15 131 lb (59.421 kg)

## 2015-03-07 ENCOUNTER — Ambulatory Visit: Admission: RE | Admit: 2015-03-07 | Payer: Medicare Other | Source: Ambulatory Visit

## 2015-03-08 ENCOUNTER — Other Ambulatory Visit: Payer: Self-pay | Admitting: Radiology

## 2015-03-08 ENCOUNTER — Ambulatory Visit: Payer: Medicare Other | Admitting: Nutrition

## 2015-03-08 ENCOUNTER — Ambulatory Visit (HOSPITAL_BASED_OUTPATIENT_CLINIC_OR_DEPARTMENT_OTHER): Payer: Medicare Other | Admitting: Internal Medicine

## 2015-03-08 ENCOUNTER — Inpatient Hospital Stay: Admission: RE | Admit: 2015-03-08 | Payer: Medicare Other | Source: Ambulatory Visit | Admitting: Radiation Oncology

## 2015-03-08 ENCOUNTER — Other Ambulatory Visit: Payer: Medicare Other

## 2015-03-08 ENCOUNTER — Ambulatory Visit
Admission: RE | Admit: 2015-03-08 | Discharge: 2015-03-08 | Disposition: A | Discharge status: Home / Self Care | Payer: Medicare Other | Source: Ambulatory Visit | Attending: Radiation Oncology | Admitting: Radiation Oncology

## 2015-03-08 ENCOUNTER — Encounter: Payer: Self-pay | Admitting: Internal Medicine

## 2015-03-08 ENCOUNTER — Other Ambulatory Visit (HOSPITAL_BASED_OUTPATIENT_CLINIC_OR_DEPARTMENT_OTHER): Payer: Medicare Other

## 2015-03-08 ENCOUNTER — Ambulatory Visit: Payer: Medicare Other

## 2015-03-08 ENCOUNTER — Telehealth: Payer: Self-pay | Admitting: Internal Medicine

## 2015-03-08 ENCOUNTER — Ambulatory Visit (HOSPITAL_BASED_OUTPATIENT_CLINIC_OR_DEPARTMENT_OTHER): Payer: Medicare Other

## 2015-03-08 VITALS — BP 125/65 | HR 96 | Temp 97.8°F | Resp 20 | Ht 71.0 in | Wt 130.1 lb

## 2015-03-08 DIAGNOSIS — C3490 Malignant neoplasm of unspecified part of unspecified bronchus or lung: Secondary | ICD-10-CM

## 2015-03-08 DIAGNOSIS — Z510 Encounter for antineoplastic radiation therapy: Secondary | ICD-10-CM | POA: Diagnosis not present

## 2015-03-08 DIAGNOSIS — Z5111 Encounter for antineoplastic chemotherapy: Secondary | ICD-10-CM | POA: Diagnosis present

## 2015-03-08 DIAGNOSIS — C3411 Malignant neoplasm of upper lobe, right bronchus or lung: Secondary | ICD-10-CM | POA: Diagnosis present

## 2015-03-08 DIAGNOSIS — C3491 Malignant neoplasm of unspecified part of right bronchus or lung: Secondary | ICD-10-CM

## 2015-03-08 LAB — COMPREHENSIVE METABOLIC PANEL (CC13)
ALT: 13 U/L (ref 0–55)
AST: 19 U/L (ref 5–34)
Albumin: 3.8 g/dL (ref 3.5–5.0)
Alkaline Phosphatase: 53 U/L (ref 40–150)
Anion Gap: 9 mEq/L (ref 3–11)
BUN: 8.2 mg/dL (ref 7.0–26.0)
CO2: 26 mEq/L (ref 22–29)
Calcium: 9.7 mg/dL (ref 8.4–10.4)
Chloride: 103 mEq/L (ref 98–109)
Creatinine: 0.9 mg/dL (ref 0.7–1.3)
EGFR: >90 mL/min/{1.73_m2} (ref >90)
Glucose: 86 mg/dl (ref 70–140)
Potassium: 4.1 mEq/L (ref 3.5–5.1)
Sodium: 138 mEq/L (ref 136–145)
Total Bilirubin: 0.35 mg/dL (ref 0.20–1.20)
Total Protein: 7.9 g/dL (ref 6.4–8.3)

## 2015-03-08 LAB — CBC WITH DIFFERENTIAL/PLATELET
BASO%: 0.2 % (ref 0.0–2.0)
Basophils Absolute: 0 10*3/uL (ref 0.0–0.1)
EOS%: 0.6 % (ref 0.0–7.0)
Eosinophils Absolute: 0 10*3/uL (ref 0.0–0.5)
HCT: 31.1 % — ABNORMAL LOW (ref 38.4–49.9)
HGB: 10.2 g/dL — ABNORMAL LOW (ref 13.0–17.1)
LYMPH%: 16.9 % (ref 14.0–49.0)
MCH: 30.2 pg (ref 27.2–33.4)
MCHC: 32.9 g/dL (ref 32.0–36.0)
MCV: 91.7 fL (ref 79.3–98.0)
MONO#: 0.2 10*3/uL (ref 0.1–0.9)
MONO%: 3.9 % (ref 0.0–14.0)
NEUT#: 3.9 10*3/uL (ref 1.5–6.5)
NEUT%: 78.4 % — ABNORMAL HIGH (ref 39.0–75.0)
Platelets: 283 10*3/uL (ref 140–400)
RBC: 3.4 10*6/uL — ABNORMAL LOW (ref 4.20–5.82)
RDW: 15.7 % — ABNORMAL HIGH (ref 11.0–14.6)
WBC: 5 10*3/uL (ref 4.0–10.3)
lymph#: 0.8 10*3/uL — ABNORMAL LOW (ref 0.9–3.3)

## 2015-03-08 MED ORDER — FAMOTIDINE IN NACL 20-0.9 MG/50ML-% IV SOLN
20.0000 mg | Freq: Once | INTRAVENOUS | Status: AC
  Administered 2015-03-08: 20 mg via INTRAVENOUS

## 2015-03-08 MED ORDER — SODIUM CHLORIDE 0.9 % IV SOLN
Freq: Once | INTRAVENOUS | Status: AC
  Administered 2015-03-08: 12:00:00 via INTRAVENOUS

## 2015-03-08 MED ORDER — DIPHENHYDRAMINE HCL 50 MG/ML IJ SOLN
INTRAMUSCULAR | Status: AC
  Filled 2015-03-08: qty 1

## 2015-03-08 MED ORDER — LIDOCAINE-PRILOCAINE 2.5-2.5 % EX CREA: 1.0000 "application " | TOPICAL_CREAM | CUTANEOUS | Status: DC | PRN

## 2015-03-08 MED ORDER — DIPHENHYDRAMINE HCL 50 MG/ML IJ SOLN
50.0000 mg | Freq: Once | INTRAMUSCULAR | Status: AC
  Administered 2015-03-08: 50 mg via INTRAVENOUS

## 2015-03-08 MED ORDER — SODIUM CHLORIDE 0.9 % IV SOLN
Freq: Once | INTRAVENOUS | Status: AC
  Administered 2015-03-08: 12:00:00 via INTRAVENOUS
  Filled 2015-03-08: qty 8

## 2015-03-08 MED ORDER — FAMOTIDINE IN NACL 20-0.9 MG/50ML-% IV SOLN
INTRAVENOUS | Status: AC
  Filled 2015-03-08: qty 50

## 2015-03-08 MED ORDER — SODIUM CHLORIDE 0.9 % IV SOLN
168.6000 mg | Freq: Once | INTRAVENOUS | Status: AC
  Administered 2015-03-08: 170 mg via INTRAVENOUS
  Filled 2015-03-08: qty 17

## 2015-03-08 MED ORDER — DEXTROSE 5 % IV SOLN
45.0000 mg/m2 | Freq: Once | INTRAVENOUS | Status: AC
  Administered 2015-03-08: 78 mg via INTRAVENOUS
  Filled 2015-03-08: qty 13

## 2015-03-08 NOTE — Progress Notes (Signed)
[place] [phone]   [phone]  OFFICE PROGRESS NOTE  Ivan Greenland, MD [address] [place] [address]  DIAGNOSIS: Stage IIIA (T2a, N2, M0) non-small cell lung cancer consistent with invasive squamous cell carcinoma presented with right upper lobe lung mass in addition to right hilar and mediastinal lymphadenopathy diagnosed in September 2016.  PRIOR THERAPY: None.  CURRENT THERAPY: Course of concurrent chemoradiation with weekly carboplatin for AUC of 2 and paclitaxel 45 MG/M2. First dose 03/01/2015. He is status post 1 cycle.  INTERVAL HISTORY: Ivan Daniels 69 y.o. male returns to the clinic today for follow-up visit accompanied by his wife. The patient tolerated the first week of his treatment fairly well with no significant adverse effects. He denied having any significant fever or chills. He has no nausea or vomiting. He quit smoking last week. He denied having any significant chest pain, shortness breath, cough or hemoptysis. He is here today to start cycle #2 of his chemotherapy.  MEDICAL HISTORY: Past Medical History  Diagnosis Date  . Hyperlipidemia   . TOBACCO ABUSE 05/24/2009    Qualifier: Diagnosis of  By: Hassell Done FNP, Tori Milks    . Paranoid schizophrenia ([place]) 10/31/2009    Qualifier: Diagnosis of  By: Jorene Minors, [place]    . BENIGN PROSTATIC HYPERTROPHY, HX OF 12/19/2006    Qualifier: Diagnosis of  By: Radene Ou MD, [place]    . Primary spontaneous pneumothorax, left 01/25/2015  . Lung mass 01/25/2015    3.7 x 3.6 cm RUL spiculated lung mass with 2.0 cm right paratracheal lymph node suspicious for bronchogenic CA  . Full code status 02/10/2015  . Allergy   . ALCOHOL ABUSE 11/12/2009    Qualifier: Diagnosis of  By: Hassell Done FNP, Tori Milks    . Non-small cell lung cancer ([place]) 01/27/15    non small-cell,squamous cell ca RUL    ALLERGIES:  is allergic to benztropine mesylate and chlorpromazine  hcl.  MEDICATIONS:  Current Outpatient Prescriptions  Medication Sig Dispense Refill  . benztropine (COGENTIN) 0.5 MG tablet Take 0.5 mg by mouth at bedtime.     . ferrous fumarate (HEMOCYTE - 106 MG FE) 325 (106 FE) MG TABS tablet Take 1 tablet by mouth.    . haloperidol (HALDOL) 5 MG tablet Take 5 mg by mouth at bedtime.     . Multiple Vitamins-Minerals (MULTIVITAMIN WITH MINERALS) tablet Take 1 tablet by mouth daily.    . simvastatin (ZOCOR) 20 MG tablet     . Wound Dressings (SONAFINE) Apply 1 application topically 3 (three) times daily.    . clopidogrel (PLAVIX) 75 MG tablet     . oxyCODONE (OXY IR/ROXICODONE) 5 MG immediate release tablet Take 1 tablet (5 mg total) by mouth every 3 (three) hours as needed for moderate pain. (Patient not taking: Reported on 03/05/2015) 30 tablet 0  . oxyCODONE 10 MG TABS Take 1 tablet (10 mg total) by mouth every 3 (three) hours as needed for severe pain. (Patient not taking: Reported on 03/05/2015) 30 tablet 0  . prochlorperazine (COMPAZINE) 10 MG tablet Take 1 tablet (10 mg total) by mouth every 6 (six) hours as needed for nausea or vomiting. (Patient not taking: Reported on 03/05/2015) 30 tablet 0   No current facility-administered medications for this visit.    SURGICAL HISTORY:  Past Surgical History  Procedure Laterality Date  . Video bronchoscopy with endobronchial ultrasound N/A 01/27/2015    Procedure: VIDEO BRONCHOSCOPY WITH ENDOBRONCHIAL ULTRASOUND;  Surgeon: Grace Isaac, MD;  Location: [facility];  Service: Thoracic;  Laterality: N/A;  . Lung biopsy Right 01/27/2015    Procedure: Right Upper Lobe Bronchus BIOPSY;  Surgeon: Grace Isaac, MD;  Location: [facility];  Service: Thoracic;  Laterality: Right;    REVIEW OF SYSTEMS:  A comprehensive review of systems was negative.   PHYSICAL EXAMINATION: General appearance: alert, cooperative and no distress Head: Normocephalic, without obvious abnormality, atraumatic Neck: no adenopathy, no  JVD, supple, symmetrical, trachea midline and thyroid not enlarged, symmetric, no tenderness/mass/nodules Lymph nodes: Cervical, supraclavicular, and axillary nodes normal. Resp: clear to auscultation bilaterally Back: symmetric, no curvature. ROM normal. No CVA tenderness. Cardio: regular rate and rhythm, S1, S2 normal, no murmur, click, rub or gallop GI: soft, non-tender; bowel sounds normal; no masses,  no organomegaly Extremities: extremities normal, atraumatic, no cyanosis or edema  ECOG PERFORMANCE STATUS: 1 - Symptomatic but completely ambulatory  Blood pressure 125/65, pulse 96, temperature 97.8 F (36.6 C), temperature source Oral, resp. rate 20, height '5\' 11"'$  (1.803 m), weight 130 lb 1.6 oz (59.013 kg), SpO2 100 %.  LABORATORY DATA: Lab Results  Component Value Date   WBC 5.0 03/08/2015   HGB 10.2* 03/08/2015   HCT 31.1* 03/08/2015   MCV 91.7 03/08/2015   PLT 283 03/08/2015      Chemistry      Component Value Date/Time   NA 138 03/08/2015 0839   NA 138 01/26/2015 1454   K 4.1 03/08/2015 0839   K 3.7 01/26/2015 1454   CL 101 01/26/2015 1454   CO2 26 03/08/2015 0839   CO2 28 01/26/2015 1454   BUN 8.2 03/08/2015 0839   BUN 9 01/26/2015 1454   CREATININE 0.9 03/08/2015 0839   CREATININE 0.95 01/26/2015 1454      Component Value Date/Time   CALCIUM 9.7 03/08/2015 0839   CALCIUM 9.1 01/26/2015 1454   ALKPHOS 53 03/08/2015 0839   ALKPHOS 52 01/26/2015 1454   AST 19 03/08/2015 0839   AST 21 01/26/2015 1454   ALT 13 03/08/2015 0839   ALT 11* 01/26/2015 1454   BILITOT 0.35 03/08/2015 0839   BILITOT <0.1* 01/26/2015 1454       RADIOGRAPHIC STUDIES: Nm Pet Image Initial (pi) Skull Base To Thigh  02/08/2015  CLINICAL DATA:  Initial treatment strategy for right lung mass. EXAM: NUCLEAR MEDICINE PET SKULL BASE TO THIGH TECHNIQUE: 7.8 mCi F-18 FDG was injected intravenously. Full-ring PET imaging was performed from the skull base to thigh after the radiotracer. CT  data was obtained and used for attenuation correction and anatomic localization. FASTING BLOOD GLUCOSE:  Value: 80 mg/dl COMPARISON:  Chest CT 01/25/2015 FINDINGS: NECK No hypermetabolic lymph nodes in the neck. CHEST The large central lung mass invading the right hilum and mediastinum is markedly hypermetabolic with SUV max of 23.7. Weakly positive aorticopulmonary window lymph node measuring 3.5 SUV. No contralateral hilar adenopathy. Mild diffuse activity in the esophagus is likely normal. Curvilinear soft tissue density in the anterior mediastinum is not hypermetabolic and may represent some type of thymic remnant. Moderate paraseptal emphysema noted in the upper lung zones bilaterally. Right upper lobe scarring change without hypermetabolism. Somewhat linear area of hypermetabolism noted in the left chest wall area. This extends from the scan down to the lower aspect of the left pectoralis muscle. It may be old granulation tissue from previous chest wound. It does not have the appearance of a metastatic focus. ABDOMEN/PELVIS No abnormal hypermetabolic activity within the liver,  pancreas, adrenal glands, or spleen. No hypermetabolic lymph nodes in the abdomen or pelvis. SKELETON No focal hypermetabolic activity to suggest skeletal metastasis. IMPRESSION: 1. Markedly hypermetabolic right central upper lobe lung mass invading the right hilum and mediastinum consistent with neoplasm. 2. Hypermetabolic aorticopulmonary window lymph nodes. No contralateral hilar adenopathy. 3. No findings for metastatic disease involving the lungs, abdomen, pelvis or bony structures. 4. Paraseptal emphysema. Electronically Signed   By: Marijo Sanes M.D.   On: 02/08/2015 15:28    ASSESSMENT AND PLAN: This is a very pleasant 69 years old African-American male with a stage IIIa non-small cell lung cancer and started on concurrent chemoradiation with weekly carboplatin and paclitaxel. Status post 1 week of treatment. The patient is  feeling fine today with no specific complaints. I recommended for the patient to proceed with cycle #2 today as a scheduled. He would come back for follow-up visit in 2 weeks for reevaluation and management of any adverse effect of his treatment. The patient was advised to call immediately if he has any concerning symptoms in the interval. The patient voices understanding of current disease status and treatment options and is in agreement with the current care plan.  All questions were answered. The patient knows to call the clinic with any problems, questions or concerns. We can certainly see the patient much sooner if necessary.  Disclaimer: This note was dictated with voice recognition software. Similar sounding words can inadvertently be transcribed and may not be corrected upon review.

## 2015-03-08 NOTE — Patient Instructions (Signed)
Smoking Cessation, Tips for Success If you are ready to quit smoking, congratulations! You have chosen to help yourself be healthier. Cigarettes bring nicotine, tar, carbon monoxide, and other irritants into your body. Your lungs, heart, and blood vessels will be able to work better without these poisons. There are many different ways to quit smoking. Nicotine gum, nicotine patches, a nicotine inhaler, or nicotine nasal spray can help with physical craving. Hypnosis, support groups, and medicines help break the habit of smoking. WHAT THINGS CAN I DO TO MAKE QUITTING EASIER?  Here are some tips to help you quit for good:  Pick a date when you will quit smoking completely. Tell all of your friends and family about your plan to quit on that date.  Do not try to slowly cut down on the number of cigarettes you are smoking. Pick a quit date and quit smoking completely starting on that day.  Throw away all cigarettes.   Clean and remove all ashtrays from your home, work, and car.  On a card, write down your reasons for quitting. Carry the card with you and read it when you get the urge to smoke.  Cleanse your body of nicotine. Drink enough water and fluids to keep your urine clear or pale yellow. Do this after quitting to flush the nicotine from your body.  Learn to predict your moods. Do not let a bad situation be your excuse to have a cigarette. Some situations in your life might tempt you into wanting a cigarette.  Never have "just one" cigarette. It leads to wanting another and another. Remind yourself of your decision to quit.  Change habits associated with smoking. If you smoked while driving or when feeling stressed, try other activities to replace smoking. Stand up when drinking your coffee. Brush your teeth after eating. Sit in a different chair when you read the paper. Avoid alcohol while trying to quit, and try to drink fewer caffeinated beverages. Alcohol and caffeine may urge you to  smoke.  Avoid foods and drinks that can trigger a desire to smoke, such as sugary or spicy foods and alcohol.  Ask people who smoke not to smoke around you.  Have something planned to do right after eating or having a cup of coffee. For example, plan to take a walk or exercise.  Try a relaxation exercise to calm you down and decrease your stress. Remember, you may be tense and nervous for the first 2 weeks after you quit, but this will pass.  Find new activities to keep your hands busy. Play with a pen, coin, or rubber band. Doodle or draw things on paper.  Brush your teeth right after eating. This will help cut down on the craving for the taste of tobacco after meals. You can also try mouthwash.   Use oral substitutes in place of cigarettes. Try using lemon drops, carrots, cinnamon sticks, or chewing gum. Keep them handy so they are available when you have the urge to smoke.  When you have the urge to smoke, try deep breathing.  Designate your home as a nonsmoking area.  If you are a heavy smoker, ask your health care provider about a prescription for nicotine chewing gum. It can ease your withdrawal from nicotine.  Reward yourself. Set aside the cigarette money you save and buy yourself something nice.  Look for support from others. Join a support group or smoking cessation program. Ask someone at home or at work to help you with your plan   to quit smoking.  Always ask yourself, "Do I need this cigarette or is this just a reflex?" Tell yourself, "Today, I choose not to smoke," or "I do not want to smoke." You are reminding yourself of your decision to quit.  Do not replace cigarette smoking with electronic cigarettes (commonly called e-cigarettes). The safety of e-cigarettes is unknown, and some may contain harmful chemicals.  If you relapse, do not give up! Plan ahead and think about what you will do the next time you get the urge to smoke. HOW WILL I FEEL WHEN I QUIT SMOKING? You  may have symptoms of withdrawal because your body is used to nicotine (the addictive substance in cigarettes). You may crave cigarettes, be irritable, feel very hungry, cough often, get headaches, or have difficulty concentrating. The withdrawal symptoms are only temporary. They are strongest when you first quit but will go away within 10-14 days. When withdrawal symptoms occur, stay in control. Think about your reasons for quitting. Remind yourself that these are signs that your body is healing and getting used to being without cigarettes. Remember that withdrawal symptoms are easier to treat than the major diseases that smoking can cause.  Even after the withdrawal is over, expect periodic urges to smoke. However, these cravings are generally short lived and will go away whether you smoke or not. Do not smoke! WHAT RESOURCES ARE AVAILABLE TO HELP ME QUIT SMOKING? Your health care provider can direct you to community resources or hospitals for support, which may include:  Group support.  Education.  Hypnosis.  Therapy.   This information is not intended to replace advice given to you by your health care provider. Make sure you discuss any questions you have with your health care provider.   Document Released: 01/21/2004 Document Revised: 05/15/2014 Document Reviewed: 10/10/2012 Elsevier Interactive Patient Education 2016 [employer]  

## 2015-03-08 NOTE — Telephone Encounter (Signed)
Gave relative avs report and appointment schedule for November and December. Per instruction from today's pof along with previous pof's patient would have weekly f/u appointments. Per desk nurse weekly f/u appointments not needed follow instruction from MM per 10/31 pof to f/u in 2 weeks and again in 4 weeks. Other f/u appointments cancelled.

## 2015-03-08 NOTE — Addendum Note (Signed)
Addended by: Curt Bears on: 03/08/2015 09:49 AM   Modules accepted: Orders

## 2015-03-08 NOTE — Progress Notes (Signed)
Nutrition follow-up completed with patient during chemotherapy for lung cancer. Weight is stable at 130.1 pounds October 31.  He remains within his usual body weight. Patient denies nutrition impact symptoms. States he drinks oral nutrition supplements 3 times a day.  Nutrition diagnosis: Food and nutrition related knowledge deficit improved.  Intervention:  I encouraged patient to continue high-calorie high-protein foods with oral nutrition supplements 3 times a day to promote weight maintenance. Provided additional samples chocolate Ensure Plus and boost plus. Questions were answered.  Teach back method used.  Monitoring, evaluation, goals: Patient will tolerate adequate calories and protein to minimize weight loss throughout treatment.  Next visit: Monday, November 7, during infusion.  **Disclaimer: This note was dictated with voice recognition software. Similar sounding words can inadvertently be transcribed and this note may contain transcription errors which may not have been corrected upon publication of note.**

## 2015-03-08 NOTE — Patient Instructions (Signed)
[facility] Discharge Instructions for Patients Receiving Chemotherapy  Today you received the following chemotherapy agents Paclitaxel/Carboplatin.   To help prevent nausea and vomiting after your treatment, we encourage you to take your nausea medication as directed.    If you develop nausea and vomiting that is not controlled by your nausea medication, call the clinic.   BELOW ARE SYMPTOMS THAT SHOULD BE REPORTED IMMEDIATELY:  *FEVER GREATER THAN 100.5 F  *CHILLS WITH OR WITHOUT FEVER  NAUSEA AND VOMITING THAT IS NOT CONTROLLED WITH YOUR NAUSEA MEDICATION  *UNUSUAL SHORTNESS OF BREATH  *UNUSUAL BRUISING OR BLEEDING  TENDERNESS IN MOUTH AND THROAT WITH OR WITHOUT PRESENCE OF ULCERS  *URINARY PROBLEMS  *BOWEL PROBLEMS  UNUSUAL RASH Items with * indicate a potential emergency and should be followed up as soon as possible.  Feel free to call the clinic you have any questions or concerns. The clinic phone number is [phone].  Please show the [facility] at check-in to the Emergency Department and triage nurse.

## 2015-03-09 ENCOUNTER — Other Ambulatory Visit: Payer: Self-pay | Admitting: Internal Medicine

## 2015-03-09 ENCOUNTER — Ambulatory Visit (HOSPITAL_COMMUNITY)
Admission: RE | Admit: 2015-03-09 | Discharge: 2015-03-09 | Disposition: A | Discharge status: Home / Self Care | Payer: Medicare Other | Source: Ambulatory Visit | Attending: Internal Medicine | Admitting: Internal Medicine

## 2015-03-09 ENCOUNTER — Ambulatory Visit
Admission: RE | Admit: 2015-03-09 | Discharge: 2015-03-09 | Disposition: A | Discharge status: Home / Self Care | Payer: Medicare Other | Source: Ambulatory Visit | Attending: Radiation Oncology | Admitting: Radiation Oncology

## 2015-03-09 ENCOUNTER — Encounter (HOSPITAL_COMMUNITY): Payer: Self-pay

## 2015-03-09 DIAGNOSIS — Z87891 Personal history of nicotine dependence: Secondary | ICD-10-CM | POA: Diagnosis not present

## 2015-03-09 DIAGNOSIS — E785 Hyperlipidemia, unspecified: Secondary | ICD-10-CM | POA: Insufficient documentation

## 2015-03-09 DIAGNOSIS — F200 Paranoid schizophrenia: Secondary | ICD-10-CM | POA: Diagnosis not present

## 2015-03-09 DIAGNOSIS — Z7902 Long term (current) use of antithrombotics/antiplatelets: Secondary | ICD-10-CM | POA: Diagnosis not present

## 2015-03-09 DIAGNOSIS — C3490 Malignant neoplasm of unspecified part of unspecified bronchus or lung: Secondary | ICD-10-CM

## 2015-03-09 DIAGNOSIS — N400 Enlarged prostate without lower urinary tract symptoms: Secondary | ICD-10-CM | POA: Insufficient documentation

## 2015-03-09 DIAGNOSIS — F1010 Alcohol abuse, uncomplicated: Secondary | ICD-10-CM | POA: Insufficient documentation

## 2015-03-09 DIAGNOSIS — Z510 Encounter for antineoplastic radiation therapy: Secondary | ICD-10-CM | POA: Diagnosis not present

## 2015-03-09 LAB — CBC WITH DIFFERENTIAL/PLATELET
Basophils Absolute: 0 10*3/uL (ref 0.0–0.1)
Basophils Relative: 0 %
Eosinophils Absolute: 0 10*3/uL (ref 0.0–0.7)
Eosinophils Relative: 0 %
HCT: 31 % — ABNORMAL LOW (ref 39.0–52.0)
Hemoglobin: 10.1 g/dL — ABNORMAL LOW (ref 13.0–17.0)
Lymphocytes Relative: 5 %
Lymphs Abs: 0.5 10*3/uL — ABNORMAL LOW (ref 0.7–4.0)
MCH: 29.5 pg (ref 26.0–34.0)
MCHC: 32.6 g/dL (ref 30.0–36.0)
MCV: 90.6 fL (ref 78.0–100.0)
Monocytes Absolute: 0.4 10*3/uL (ref 0.1–1.0)
Monocytes Relative: 5 %
Neutro Abs: 8.2 10*3/uL — ABNORMAL HIGH (ref 1.7–7.7)
Neutrophils Relative %: 90 %
Platelets: 317 10*3/uL (ref 150–400)
RBC: 3.42 MIL/uL — ABNORMAL LOW (ref 4.22–5.81)
RDW: 15.8 % — ABNORMAL HIGH (ref 11.5–15.5)
WBC: 9.1 10*3/uL (ref 4.0–10.5)

## 2015-03-09 LAB — PROTIME-INR
INR: 1.15 (ref 0.00–1.49)
Prothrombin Time: 14.9 seconds (ref 11.6–15.2)

## 2015-03-09 MED ORDER — LIDOCAINE-EPINEPHRINE 2 %-1:100000 IJ SOLN
INTRAMUSCULAR | Status: AC
  Filled 2015-03-09: qty 1

## 2015-03-09 MED ORDER — CEFAZOLIN SODIUM-DEXTROSE 2-3 GM-% IV SOLR
2.0000 g | Freq: Once | INTRAVENOUS | Status: AC
  Administered 2015-03-09: 2 g via INTRAVENOUS

## 2015-03-09 MED ORDER — MIDAZOLAM HCL 2 MG/2ML IJ SOLN
INTRAMUSCULAR | Status: AC
  Filled 2015-03-09: qty 6

## 2015-03-09 MED ORDER — SODIUM CHLORIDE 0.9 % IV SOLN
INTRAVENOUS | Status: DC
  Administered 2015-03-09: 500 mL via INTRAVENOUS

## 2015-03-09 MED ORDER — LIDOCAINE HCL 1 % IJ SOLN
INTRAMUSCULAR | Status: AC
  Filled 2015-03-09: qty 20

## 2015-03-09 MED ORDER — MIDAZOLAM HCL 2 MG/2ML IJ SOLN
INTRAMUSCULAR | Status: AC | PRN
  Administered 2015-03-09: 1 mg via INTRAVENOUS
  Administered 2015-03-09: 0.5 mg via INTRAVENOUS

## 2015-03-09 MED ORDER — FENTANYL CITRATE (PF) 100 MCG/2ML IJ SOLN
INTRAMUSCULAR | Status: AC | PRN
  Administered 2015-03-09: 50 ug via INTRAVENOUS

## 2015-03-09 MED ORDER — HEPARIN SOD (PORK) LOCK FLUSH 100 UNIT/ML IV SOLN
INTRAVENOUS | Status: AC
  Filled 2015-03-09: qty 5

## 2015-03-09 MED ORDER — FENTANYL CITRATE (PF) 100 MCG/2ML IJ SOLN
INTRAMUSCULAR | Status: AC
  Filled 2015-03-09: qty 4

## 2015-03-09 MED ORDER — CEFAZOLIN SODIUM-DEXTROSE 2-3 GM-% IV SOLR
INTRAVENOUS | Status: AC
  Administered 2015-03-09: 2 g via INTRAVENOUS
  Filled 2015-03-09: qty 50

## 2015-03-09 NOTE — Discharge Instructions (Signed)
Implanted Port Insertion, Care After °Refer to this sheet in the next few weeks. These instructions provide you with information on caring for yourself after your procedure. Your health care provider may also give you more specific instructions. Your treatment has been planned according to current medical practices, but problems sometimes occur. Call your health care provider if you have any problems or questions after your procedure. °WHAT TO EXPECT AFTER THE PROCEDURE °After your procedure, it is typical to have the following:  °· Discomfort at the port insertion site. Ice packs to the area will help. °· Bruising on the skin over the port. This will subside in 3-4 days. °HOME CARE INSTRUCTIONS °· After your port is placed, you will get a manufacturer's information card. The card has information about your port. Keep this card with you at all times.   °· Know what kind of port you have. There are many types of ports available.   °· Wear a medical alert bracelet in case of an emergency. This can help alert health care workers that you have a port.   °· The port can stay in for as long as your health care provider believes it is necessary.   °· A home health care nurse may give medicines and take care of the port.   °· You or a family member can get special training and directions for giving medicine and taking care of the port at home.   °SEEK MEDICAL CARE IF:  °· Your port does not flush or you are unable to get a blood return.   °· You have a fever or chills. °SEEK IMMEDIATE MEDICAL CARE IF: °· You have new fluid or pus coming from your incision.   °· You notice a bad smell coming from your incision site.   °· You have swelling, pain, or more redness at the incision or port site.   °· You have chest pain or shortness of breath. °  °This information is not intended to replace advice given to you by your health care provider. Make sure you discuss any questions you have with your health care provider. °  °Document  Released: 02/12/2013 Document Revised: 04/29/2013 Document Reviewed: 02/12/2013 °Elsevier Interactive Patient Education ©2016 Else[employer] Inc[employer] °Implanted Port Home Guide °An implanted port is a type of central line that is placed under the skin. Central lines are used to provide IV access when treatment or nutrition needs to be given through a person's veins. Implanted ports are used for long-term IV access. An implanted port may be placed because:  °· You need IV medicine that would be irritating to the small veins in your hands or arms.   °· You need long-term IV medicines, such as antibiotics.   °· You need IV nutrition for a long period.   °· You need frequent blood draws for lab tests.   °· You need dialysis.   °Implanted ports are usually placed in the chest area, but they can also be placed in the upper arm, the abdomen, or the leg. An implanted port has two main parts:  °· Reservoir. The reservoir is round and will appear as a small, raised area under your skin. The reservoir is the part where a needle is inserted to give medicines or draw blood.   °· Catheter. The catheter is a thin, flexible tube that extends from the reservoir. The catheter is placed into a large vein. Medicine that is inserted into the reservoir goes into the catheter and then into the vein.   °HOW WILL I CARE FOR MY INCISION SITE? °Do not get the   incision site wet. Bathe or shower as directed by your health care provider.  °HOW IS MY PORT ACCESSED? °Special steps must be taken to access the port:  °· Before the port is accessed, a numbing cream can be placed on the skin. This helps numb the skin over the port site.   °· Your health care provider uses a sterile technique to access the port. °· Your health care provider must put on a mask and sterile gloves. °· The skin over your port is cleaned carefully with an antiseptic and allowed to dry. °· The port is gently pinched between sterile gloves, and a needle is inserted into the  port. °· Only "non-coring" port needles should be used to access the port. Once the port is accessed, a blood return should be checked. This helps ensure that the port is in the vein and is not clogged.   °· If your port needs to remain accessed for a constant infusion, a clear (transparent) bandage will be placed over the needle site. The bandage and needle will need to be changed every week, or as directed by your health care provider.   °· Keep the bandage covering the needle clean and dry. Do not get it wet. Follow your health care provider's instructions on how to take a shower or bath while the port is accessed.   °· If your port does not need to stay accessed, no bandage is needed over the port.   °WHAT IS FLUSHING? °Flushing helps keep the port from getting clogged. Follow your health care provider's instructions on how and when to flush the port. Ports are usually flushed with saline solution or a medicine called heparin. The need for flushing will depend on how the port is used.  °· If the port is used for intermittent medicines or blood draws, the port will need to be flushed:   °· After medicines have been given.   °· After blood has been drawn.   °· As part of routine maintenance.   °· If a constant infusion is running, the port may not need to be flushed.   °HOW LONG WILL MY PORT STAY IMPLANTED? °The port can stay in for as long as your health care provider thinks it is needed. When it is time for the port to come out, surgery will be done to remove it. The procedure is similar to the one performed when the port was put in.  °WHEN SHOULD I SEEK IMMEDIATE MEDICAL CARE? °When you have an implanted port, you should seek immediate medical care if:  °· You notice a bad smell coming from the incision site.   °· You have swelling, redness, or drainage at the incision site.   °· You have more swelling or pain at the port site or the surrounding area.   °· You have a fever that is not controlled with  medicine. °  °This information is not intended to replace advice given to you by your health care provider. Make sure you discuss any questions you have with your health care provider. °  °Document Released: 04/24/2005 Document Revised: 02/12/2013 Document Reviewed: 12/30/2012 °Elsevier Interactive Patient Education ©2016 Else[employer] Inc[employer] °Moderate Conscious Sedation, Adult °Sedation is the use of medicines to promote relaxation and relieve discomfort and anxiety. Moderate conscious sedation is a type of sedation. Under moderate conscious sedation you are less alert than normal but are still able to respond to instructions or stimulation. Moderate conscious sedation is used during short medical and dental procedures. It is milder than deep sedation or general anesthesia and   allows you to return to your regular activities sooner. °LET YOUR HEALTH CARE PROVIDER KNOW ABOUT:  °· Any allergies you have. °· All medicines you are taking, including vitamins, herbs, eye drops, creams, and over-the-counter medicines. °· Use of steroids (by mouth or creams). °· Previous problems you or members of your family have had with the use of anesthetics. °· Any blood disorders you have. °· Previous surgeries you have had. °· Medical conditions you have. °· Possibility of pregnancy, if this applies. °· Use of cigarettes, alcohol, or illegal drugs. °RISKS AND COMPLICATIONS °Generally, this is a safe procedure. However, as with any procedure, problems can occur. Possible problems include: °· Oversedation. °· Trouble breathing on your own. You may need to have a breathing tube until you are awake and breathing on your own. °· Allergic reaction to any of the medicines used for the procedure. °BEFORE THE PROCEDURE °· You may have blood tests done. These tests can help show how well your kidneys and liver are working. They can also show how well your blood clots. °· A physical exam will be done.   °· Only take medicines as directed by your  health care provider. You may need to stop taking medicines (such as blood thinners, aspirin, or nonsteroidal anti-inflammatory drugs) before the procedure.   °· Do not eat or drink at least 6 hours before the procedure or as directed by your health care provider. °· Arrange for a responsible adult, family member, or friend to take you home after the procedure. He or she should stay with you for at least 24 hours after the procedure, until the medicine has worn off. °PROCEDURE  °· An intravenous (IV) catheter will be inserted into one of your veins. Medicine will be able to flow directly into your body through this catheter. You may be given medicine through this tube to help prevent pain and help you relax. °· The medical or dental procedure will be done. °AFTER THE PROCEDURE °· You will stay in a recovery area until the medicine has worn off. Your blood pressure and pulse will be checked.   °·  Depending on the procedure you had, you may be allowed to go home when you can tolerate liquids and your pain is under control. °  °This information is not intended to replace advice given to you by your health care provider. Make sure you discuss any questions you have with your health care provider. °  °Document Released: 01/17/2001 Document Revised: 05/15/2014 Document Reviewed: 12/30/2012 °Elsevier Interactive Patient Education ©2016 Else[employer] Inc[employer] °Moderate Conscious Sedation, Adult, Care After °Refer to this sheet in the next few weeks. These instructions provide you with information on caring for yourself after your procedure. Your health care provider may also give you more specific instructions. Your treatment has been planned according to current medical practices, but problems sometimes occur. Call your health care provider if you have any problems or questions after your procedure. °WHAT TO EXPECT AFTER THE PROCEDURE  °After your procedure: °· You may feel sleepy, clumsy, and have poor balance for several  hours. °· Vomiting may occur if you eat too soon after the procedure. °HOME CARE INSTRUCTIONS °· Do not participate in any activities where you could become injured for at least 24 hours. Do not: °¨ Drive. °¨ Swim. °¨ Ride a bicycle. °¨ Operate heavy machinery. °¨ Cook. °¨ Use power tools. °¨ Climb ladders. °¨ Work from a high place. °· Do not make important decisions or sign legal documents until you are improved. °·   If you vomit, drink water, juice, or soup when you can drink without vomiting. Make sure you have little or no nausea before eating solid foods. °· Only take over-the-counter or prescription medicines for pain, discomfort, or fever as directed by your health care provider. °· Make sure you and your family fully understand everything about the medicines given to you, including what side effects may occur. °· You should not drink alcohol, take sleeping pills, or take medicines that cause drowsiness for at least 24 hours. °· If you smoke, do not smoke without supervision. °· If you are feeling better, you may resume normal activities 24 hours after you were sedated. °· Keep all appointments with your health care provider. °SEEK MEDICAL CARE IF: °· Your skin is pale or bluish in color. °· You continue to feel nauseous or vomit. °· Your pain is getting worse and is not helped by medicine. °· You have bleeding or swelling. °· You are still sleepy or feeling clumsy after 24 hours. °SEEK IMMEDIATE MEDICAL CARE IF: °· You develop a rash. °· You have difficulty breathing. °· You develop any type of allergic problem. °· You have a fever. °MAKE SURE YOU: °· Understand these instructions. °· Will watch your condition. °· Will get help right away if you are not doing well or get worse. °  °This information is not intended to replace advice given to you by your health care provider. Make sure you discuss any questions you have with your health care provider. °  °Document Released: 02/12/2013 Document Revised:  05/15/2014 Document Reviewed: 02/12/2013 °Elsevier Interactive Patient Education ©2016 Elsevier Inc. ° °

## 2015-03-09 NOTE — H&P (Signed)
Chief Complaint: Patient was seen in consultation today for Port-A-Cath placement  Referring Physician(s): Mohamed,Mohamed  History of Present Illness: Ivan Daniels is a 69 y.o. 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 Patient is currently receiving chemoradiation , has poor venous access and presents today for Port-A-Cath placement.  Past Medical History  Diagnosis Date  . Hyperlipidemia   . TOBACCO ABUSE 05/24/2009    Qualifier: Diagnosis of  By: Hassell Done FNP, Tori Milks    . Paranoid schizophrenia ([place]) 10/31/2009    Qualifier: Diagnosis of  By: Jorene Minors, [place]    . BENIGN PROSTATIC HYPERTROPHY, HX OF 12/19/2006    Qualifier: Diagnosis of  By: Radene Ou MD, [place]    . Primary spontaneous pneumothorax, left 01/25/2015  . Lung mass 01/25/2015    3.7 x 3.6 cm RUL spiculated lung mass with 2.0 cm right paratracheal lymph node suspicious for bronchogenic CA  . Full code status 02/10/2015  . Allergy   . ALCOHOL ABUSE 11/12/2009    Qualifier: Diagnosis of  By: Hassell Done FNP, Tori Milks    . Non-small cell lung cancer ([place]) 01/27/15    non small-cell,squamous cell ca RUL    Past Surgical History  Procedure Laterality Date  . Video bronchoscopy with endobronchial ultrasound N/A 01/27/2015    Procedure: VIDEO BRONCHOSCOPY WITH ENDOBRONCHIAL ULTRASOUND;  Surgeon: Grace Isaac, MD;  Location: [facility];  Service: Thoracic;  Laterality: N/A;  . Lung biopsy Right 01/27/2015    Procedure: Right Upper Lobe Bronchus BIOPSY;  Surgeon: Grace Isaac, MD;  Location: [facility];  Service: Thoracic;  Laterality: Right;    Allergies: Benztropine mesylate and Chlorpromazine hcl  Medications: Prior to Admission medications   Medication Sig Start Date End Date Taking? Authorizing Provider  benztropine  (COGENTIN) 0.5 MG tablet Take 0.5 mg by mouth at bedtime.  02/24/13  Yes Historical Provider, MD  ferrous fumarate (HEMOCYTE - 106 MG FE) 325 (106 FE) MG TABS tablet Take 1 tablet by mouth.   Yes Historical Provider, MD  haloperidol (HALDOL) 5 MG tablet Take 5 mg by mouth at bedtime.  02/21/13  Yes Historical Provider, MD  Multiple Vitamins-Minerals (MULTIVITAMIN WITH MINERALS) tablet Take 1 tablet by mouth daily.   Yes Historical Provider, MD  simvastatin (ZOCOR) 20 MG tablet  02/14/13  Yes Historical Provider, MD  clopidogrel (PLAVIX) 75 MG tablet  12/09/12   Historical Provider, MD  lidocaine-prilocaine (EMLA) cream Apply 1 application topically as needed. 03/08/15   Curt Bears, MD  oxyCODONE (OXY IR/ROXICODONE) 5 MG immediate release tablet Take 1 tablet (5 mg total) by mouth every 3 (three) hours as needed for moderate pain. Patient not taking: Reported on 03/05/2015 01/29/15   [name] R [name], PA-C  oxyCODONE 10 MG TABS Take 1 tablet (10 mg total) by mouth every 3 (three) hours as needed for severe pain. Patient not taking: Reported on 03/05/2015 01/29/15   [name] R [name], PA-C  prochlorperazine (COMPAZINE) 10 MG tablet Take 1 tablet (10 mg total) by mouth every 6 (six) hours as needed for nausea or vomiting. Patient not taking: Reported on 03/05/2015 02/10/15   Curt Bears, MD  W[facility]) Apply 1 application topically 3 (three) times daily.    Historical Provider, MD     Family History  Problem Relation Age of Onset  . Cancer  Sister     breast    Social History   Social History  . Marital Status: Single    Spouse Name: N/A  . Number of Children: N/A  . Years of Education: N/A   Social History Main Topics  . Smoking status: Former Smoker -- 0.50 packs/day for 52 years    Types: Cigarettes  . Smokeless tobacco: Former Systems developer    Quit date: 03/01/2015  . Alcohol Use: No  . Drug Use: No  . Sexual Activity: No   Other Topics Concern  . None   Social History  Narrative      Review of Systems  Constitutional: Negative for fever and chills.  Respiratory: Positive for cough. Negative for shortness of breath.   Cardiovascular: Negative for chest pain.  Gastrointestinal: Negative for nausea, vomiting, abdominal pain and blood in stool.  Genitourinary: Negative for dysuria and hematuria.  Musculoskeletal: Negative for back pain.  Neurological: Negative for headaches.    Vital Signs: BP 126/67 mmHg  Pulse 116  Temp(Src) 98 F (36.7 C) (Oral)  Resp 18  Ht '5\' 11"'$  (1.803 m)  Wt 130 lb (58.968 kg)  BMI 18.14 kg/m2  SpO2 100%  Physical Exam  Constitutional: He is oriented to person, place, and time.  Thin black male in no acute distress  Cardiovascular: Regular rhythm.   Tachycardic  Pulmonary/Chest: Effort normal and breath sounds normal.  Abdominal: Soft. Bowel sounds are normal. There is no tenderness.  Musculoskeletal: Normal range of motion. He exhibits no edema.  Neurological: He is alert and oriented to person, place, and time.    Mallampati Score:     Imaging: Nm Pet Image Initial (pi) Skull Base To Thigh  02/08/2015  CLINICAL DATA:  Initial treatment strategy for right lung mass. EXAM: NUCLEAR MEDICINE PET SKULL BASE TO THIGH TECHNIQUE: 7.8 mCi F-18 FDG was injected intravenously. Full-ring PET imaging was performed from the skull base to thigh after the radiotracer. CT data was obtained and used for attenuation correction and anatomic localization. FASTING BLOOD GLUCOSE:  Value: 80 mg/dl COMPARISON:  Chest CT 01/25/2015 FINDINGS: NECK No hypermetabolic lymph nodes in the neck. CHEST The large central lung mass invading the right hilum and mediastinum is markedly hypermetabolic with SUV max of 37.1. Weakly positive aorticopulmonary window lymph node measuring 3.5 SUV. No contralateral hilar adenopathy. Mild diffuse activity in the esophagus is likely normal. Curvilinear soft tissue density in the anterior mediastinum is not  hypermetabolic and may represent some type of thymic remnant. Moderate paraseptal emphysema noted in the upper lung zones bilaterally. Right upper lobe scarring change without hypermetabolism. Somewhat linear area of hypermetabolism noted in the left chest wall area. This extends from the scan down to the lower aspect of the left pectoralis muscle. It may be old granulation tissue from previous chest wound. It does not have the appearance of a metastatic focus. ABDOMEN/PELVIS No abnormal hypermetabolic activity within the liver, pancreas, adrenal glands, or spleen. No hypermetabolic lymph nodes in the abdomen or pelvis. SKELETON No focal hypermetabolic activity to suggest skeletal metastasis. IMPRESSION: 1. Markedly hypermetabolic right central upper lobe lung mass invading the right hilum and mediastinum consistent with neoplasm. 2. Hypermetabolic aorticopulmonary window lymph nodes. No contralateral hilar adenopathy. 3. No findings for metastatic disease involving the lungs, abdomen, pelvis or bony structures. 4. Paraseptal emphysema. Electronically Signed   By: Marijo Sanes M.D.   On: 02/08/2015 15:28    Labs:  CBC:  Recent Labs  02/10/15 1304 03/01/15 1157 03/08/15  1610 03/09/15 1240  WBC 6.2 6.8 5.0 9.1  HGB 11.9* 10.9* 10.2* 10.1*  HCT 35.3* 33.6* 31.1* 31.0*  PLT 314 305 283 317    COAGS:  Recent Labs  01/26/15 1454 03/09/15 1240  INR 1.42 1.15  APTT 37  --     BMP:  Recent Labs  01/25/15 1820 01/26/15 1454 02/10/15 1304 03/01/15 1158 03/08/15 0839  NA 137 138 141 141 138  K 4.3 3.7 4.6 4.1 4.1  CL 103 101  --   --   --   CO2 25 28 30* 29 26  GLUCOSE 98 108* 107 124 86  BUN 9 9 8.0 9.7 8.2  CALCIUM 9.1 9.1 9.8 10.2 9.7  CREATININE 1.09 0.95 1.0 1.1 0.9  GFRNONAA >60 >60  --   --   --   GFRAA >60 >60  --   --   --     LIVER FUNCTION TESTS:  Recent Labs  01/26/15 1454 02/10/15 1304 03/01/15 1158 03/08/15 0839  BILITOT <0.1* <0.30 <0.30 0.35  AST '21 19  20 19  '$ ALT 11* '10 9 13  '$ ALKPHOS 52 55 56 53  PROT 7.2 7.4 7.8 7.9  ALBUMIN 3.5 3.7 3.9 3.8    TUMOR MARKERS: No results for input(s): AFPTM, CEA, CA199, CHROMGRNA in the last 8760 hours.  Assessment and Plan: ROBT OKUDA is a 69 y.o. 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 Patient is currently receiving chemoradiation , has poor venous access and presents today for Port-A-Cath placement.Risks and benefits discussed with the patient/sister including, but not limited to bleeding, infection, pneumothorax, or fibrin sheath development and need for additional procedures. All of the patient's questions were answered, patient is agreeable to proceed. Consent signed and in chart.     Thank you for this interesting consult.  I greatly enjoyed meeting CELESTINE BOUGIE and look forward to participating in their care.  A copy of this report was sent to the requesting provider on this date.  Signed: D. Rowe Robert 03/09/2015, 1:35 PM   I spent a total of 15 minutes in face to face in clinical consultation, greater than 50% of which was counseling/coordinating care for Port-A-Cath placement

## 2015-03-09 NOTE — Procedures (Signed)
R IJ Port cathter placement with US and fluoroscopy No complication No blood loss. See complete dictation in [facility] PACS.  

## 2015-03-10 ENCOUNTER — Ambulatory Visit
Admission: RE | Admit: 2015-03-10 | Discharge: 2015-03-10 | Disposition: A | Discharge status: Home / Self Care | Payer: Medicare Other | Source: Ambulatory Visit | Attending: Radiation Oncology | Admitting: Radiation Oncology

## 2015-03-10 DIAGNOSIS — Z510 Encounter for antineoplastic radiation therapy: Secondary | ICD-10-CM | POA: Diagnosis not present

## 2015-03-11 ENCOUNTER — Ambulatory Visit
Admission: RE | Admit: 2015-03-11 | Discharge: 2015-03-11 | Disposition: A | Discharge status: Home / Self Care | Payer: Medicare Other | Source: Ambulatory Visit | Attending: Radiation Oncology | Admitting: Radiation Oncology

## 2015-03-11 DIAGNOSIS — Z510 Encounter for antineoplastic radiation therapy: Secondary | ICD-10-CM | POA: Diagnosis not present

## 2015-03-12 ENCOUNTER — Ambulatory Visit
Admission: RE | Admit: 2015-03-12 | Discharge: 2015-03-12 | Disposition: A | Discharge status: Home / Self Care | Payer: Medicare Other | Source: Ambulatory Visit | Attending: Radiation Oncology | Admitting: Radiation Oncology

## 2015-03-12 ENCOUNTER — Encounter: Payer: Self-pay | Admitting: Radiation Oncology

## 2015-03-12 VITALS — BP 115/62 | HR 109 | Temp 99.0°F | Resp 18 | Ht 71.0 in | Wt 129.9 lb

## 2015-03-12 DIAGNOSIS — Z510 Encounter for antineoplastic radiation therapy: Secondary | ICD-10-CM | POA: Diagnosis not present

## 2015-03-12 DIAGNOSIS — C3411 Malignant neoplasm of upper lobe, right bronchus or lung: Secondary | ICD-10-CM

## 2015-03-12 NOTE — Progress Notes (Signed)
Department of Radiation Oncology  Phone:  [phone] Fax:        [phone]  Weekly Treatment Note    Name: Ivan Daniels Date: 03/12/2015 MRN: [id number] DOB: Apr 30, 1946   Current dose: 10 Gy  Current fraction: 20   MEDICATIONS: Current Outpatient Prescriptions  Medication Sig Dispense Refill  . benztropine (COGENTIN) 0.5 MG tablet Take 0.5 mg by mouth at bedtime.     . clopidogrel (PLAVIX) 75 MG tablet     . ferrous fumarate (HEMOCYTE - 106 MG FE) 325 (106 FE) MG TABS tablet Take 1 tablet by mouth.    . haloperidol (HALDOL) 5 MG tablet Take 5 mg by mouth at bedtime.     . lidocaine-prilocaine (EMLA) cream Apply 1 application topically as needed. 30 g 0  . Multiple Vitamins-Minerals (MULTIVITAMIN WITH MINERALS) tablet Take 1 tablet by mouth daily.    . simvastatin (ZOCOR) 20 MG tablet     . Wound Dressings (SONAFINE) Apply 1 application topically 3 (three) times daily.    Marland Kitchen oxyCODONE (OXY IR/ROXICODONE) 5 MG immediate release tablet Take 1 tablet (5 mg total) by mouth every 3 (three) hours as needed for moderate pain. (Patient not taking: Reported on 03/05/2015) 30 tablet 0  . oxyCODONE 10 MG TABS Take 1 tablet (10 mg total) by mouth every 3 (three) hours as needed for severe pain. (Patient not taking: Reported on 03/05/2015) 30 tablet 0  . prochlorperazine (COMPAZINE) 10 MG tablet Take 1 tablet (10 mg total) by mouth every 6 (six) hours as needed for nausea or vomiting. (Patient not taking: Reported on 03/05/2015) 30 tablet 0   No current facility-administered medications for this encounter.     ALLERGIES: Benztropine mesylate and Chlorpromazine hcl   LABORATORY DATA:  Lab Results  Component Value Date   WBC 9.1 03/09/2015   HGB 10.1* 03/09/2015   HCT 31.0* 03/09/2015   MCV 90.6 03/09/2015   PLT 317 03/09/2015   Lab Results  Component Value Date   NA 138 03/08/2015   K 4.1 03/08/2015   CL 101 01/26/2015   CO2 26 03/08/2015   Lab Results  Component  Value Date   ALT 13 03/08/2015   AST 19 03/08/2015   ALKPHOS 53 03/08/2015   BILITOT 0.35 03/08/2015     NARRATIVE: Ivan Daniels was seen today for weekly treatment management. He has completed 9 fractions to his right lung thus far. He reports having discomfort in his neck. This symptoms began about last week.  He also reports symptoms of a dry cough that has decreased in frequency. However, he denies having shortness of breath, pain swallowing, heart burn, skin irritation, or trouble swallowing. The patient expressed having a good appetite, fatigue that is more present around the afternoon time, and constipation. To address his constipation he ate "boiled eggs, cranberry juice, and milk" to solve this issue. He had chemotherapy on Monday (03/08/2015). His left eye was half closed throughout the entire appointment, but did not appear red, swollen, or irritated. The chart was checked and the patient's films were reviewed. The patient projected a healthy mental status and was accompanied by his family today.     BP 115/62 mmHg  Pulse 109  Temp(Src) 99 F (37.2 C) (Oral)  Resp 18  Ht '5\' 11"'$  (1.803 m)  Wt 129 lb 14.4 oz (58.922 kg)  BMI 18.13 kg/m2  SpO2 100%   Wt Readings from Last 3 Encounters:  03/12/15 129 lb 14.4 oz (58.922 kg)  03/09/15 130 lb (58.968 kg)  03/08/15 130 lb 1.6 oz (59.013 kg)    PHYSICAL EXAMINATION: height is '5\' 11"'$  (1.803 m) and weight is 129 lb 14.4 oz (58.922 kg). His oral temperature is 99 F (37.2 C). His blood pressure is 115/62 and his pulse is 109. His respiration is 18 and oxygen saturation is 100%.  The patient is alert and oriented. There is no significant changes to the status of overall health to be noted at this time.  ASSESSMENT: The patient is doing satisfactorily with treatment. He is managing reported symptoms approprietly. The patient understands that he can use a stool softener to address his symptom of constipation. The patient is aware that he  may access his appointments and medical records via [employer].   PLAN: We will continue with the patient's radiation treatment as planned. Healthy methods of management in regards to reported symptoms were reviewed. All vocalized questions and concerns have been addressed. If the patient develops any further questions or concerns in regards to his treatment and recovery, he has been encouraged to contact Dr. Lisbeth Renshaw, MD. He is aware of his follow-up appointment with radiation oncology to take place next week, as scheduled.    This document serves as a record of services personally performed by Kyung Rudd, MD. It was created on his behalf by Lenn Cal, a trained medical scribe. The creation of this record is based on the scribe's personal observations and the provider's statements to them. This document has been checked and approved by the attending provider. ------------------------------------------------  Jodelle Gross, MD, PhD

## 2015-03-12 NOTE — Progress Notes (Signed)
Ivan Daniels has completed 9 fractions to his right lung.  He reports having discomfort in his neck that started last week.  He reports having a dry cough that has decreased in frequency.  He denies having shortness of breath and trouble swallowing.  He had chemotherapy on Monday.  He reports having a good appetite.  He also has had fatigue in the afternoons. He does not have any skin irritation.  BP 115/62 mmHg  Pulse 109  Temp(Src) 99 F (37.2 C) (Oral)  Resp 18  Ht '5\' 11"'$  (1.803 m)  Wt 129 lb 14.4 oz (58.922 kg)  BMI 18.13 kg/m2  SpO2 100%

## 2015-03-15 ENCOUNTER — Ambulatory Visit: Payer: Medicare Other | Admitting: Nutrition

## 2015-03-15 ENCOUNTER — Other Ambulatory Visit: Payer: Medicare Other

## 2015-03-15 ENCOUNTER — Ambulatory Visit
Admission: RE | Admit: 2015-03-15 | Discharge: 2015-03-15 | Disposition: A | Discharge status: Home / Self Care | Payer: Medicare Other | Source: Ambulatory Visit | Attending: Radiation Oncology | Admitting: Radiation Oncology

## 2015-03-15 ENCOUNTER — Encounter: Payer: Self-pay | Admitting: *Deleted

## 2015-03-15 ENCOUNTER — Ambulatory Visit (HOSPITAL_BASED_OUTPATIENT_CLINIC_OR_DEPARTMENT_OTHER): Payer: Medicare Other

## 2015-03-15 ENCOUNTER — Ambulatory Visit: Payer: Medicare Other | Admitting: Physician Assistant

## 2015-03-15 ENCOUNTER — Other Ambulatory Visit (HOSPITAL_BASED_OUTPATIENT_CLINIC_OR_DEPARTMENT_OTHER): Payer: Medicare Other

## 2015-03-15 VITALS — BP 134/63 | HR 100 | Temp 98.1°F | Resp 18

## 2015-03-15 DIAGNOSIS — C3411 Malignant neoplasm of upper lobe, right bronchus or lung: Secondary | ICD-10-CM | POA: Diagnosis present

## 2015-03-15 DIAGNOSIS — Z510 Encounter for antineoplastic radiation therapy: Secondary | ICD-10-CM | POA: Diagnosis not present

## 2015-03-15 DIAGNOSIS — C3490 Malignant neoplasm of unspecified part of unspecified bronchus or lung: Secondary | ICD-10-CM

## 2015-03-15 DIAGNOSIS — Z5111 Encounter for antineoplastic chemotherapy: Secondary | ICD-10-CM | POA: Diagnosis not present

## 2015-03-15 DIAGNOSIS — C3491 Malignant neoplasm of unspecified part of right bronchus or lung: Secondary | ICD-10-CM

## 2015-03-15 LAB — COMPREHENSIVE METABOLIC PANEL (CC13)
ALT: 12 U/L (ref 0–55)
AST: 16 U/L (ref 5–34)
Albumin: 3.2 g/dL — ABNORMAL LOW (ref 3.5–5.0)
Alkaline Phosphatase: 51 U/L (ref 40–150)
Anion Gap: 8 mEq/L (ref 3–11)
BUN: 9 mg/dL (ref 7.0–26.0)
CO2: 27 mEq/L (ref 22–29)
Calcium: 9.6 mg/dL (ref 8.4–10.4)
Chloride: 102 mEq/L (ref 98–109)
Creatinine: 0.8 mg/dL (ref 0.7–1.3)
EGFR: >90 mL/min/{1.73_m2} (ref >90)
Glucose: 114 mg/dl (ref 70–140)
Potassium: 3.8 mEq/L (ref 3.5–5.1)
Sodium: 138 mEq/L (ref 136–145)
Total Bilirubin: <0.3 mg/dL (ref 0.20–1.20)
Total Protein: 7.2 g/dL (ref 6.4–8.3)

## 2015-03-15 LAB — CBC WITH DIFFERENTIAL/PLATELET
BASO%: 0.3 % (ref 0.0–2.0)
Basophils Absolute: 0 10*3/uL (ref 0.0–0.1)
EOS%: 0.3 % (ref 0.0–7.0)
Eosinophils Absolute: 0 10*3/uL (ref 0.0–0.5)
HCT: 28.4 % — ABNORMAL LOW (ref 38.4–49.9)
HGB: 9.2 g/dL — ABNORMAL LOW (ref 13.0–17.1)
LYMPH%: 11.5 % — ABNORMAL LOW (ref 14.0–49.0)
MCH: 29.5 pg (ref 27.2–33.4)
MCHC: 32.4 g/dL (ref 32.0–36.0)
MCV: 91 fL (ref 79.3–98.0)
MONO#: 0.4 10*3/uL (ref 0.1–0.9)
MONO%: 11.5 % (ref 0.0–14.0)
NEUT#: 2.7 10*3/uL (ref 1.5–6.5)
NEUT%: 76.4 % — ABNORMAL HIGH (ref 39.0–75.0)
Platelets: 288 10*3/uL (ref 140–400)
RBC: 3.12 10*6/uL — ABNORMAL LOW (ref 4.20–5.82)
RDW: 15.9 % — ABNORMAL HIGH (ref 11.0–14.6)
WBC: 3.6 10*3/uL — ABNORMAL LOW (ref 4.0–10.3)
lymph#: 0.4 10*3/uL — ABNORMAL LOW (ref 0.9–3.3)

## 2015-03-15 MED ORDER — PACLITAXEL CHEMO INJECTION 300 MG/50ML
45.0000 mg/m2 | Freq: Once | INTRAVENOUS | Status: AC
  Administered 2015-03-15: 78 mg via INTRAVENOUS
  Filled 2015-03-15: qty 13

## 2015-03-15 MED ORDER — HEPARIN SOD (PORK) LOCK FLUSH 100 UNIT/ML IV SOLN
500.0000 [IU] | Freq: Once | INTRAVENOUS | Status: AC | PRN
  Administered 2015-03-15: 500 [IU]
  Filled 2015-03-15: qty 5

## 2015-03-15 MED ORDER — SODIUM CHLORIDE 0.9 % IV SOLN
Freq: Once | INTRAVENOUS | Status: AC
  Administered 2015-03-15: 10:00:00 via INTRAVENOUS

## 2015-03-15 MED ORDER — SODIUM CHLORIDE 0.9 % IV SOLN
Freq: Once | INTRAVENOUS | Status: AC
  Administered 2015-03-15: 10:00:00 via INTRAVENOUS
  Filled 2015-03-15: qty 8

## 2015-03-15 MED ORDER — DIPHENHYDRAMINE HCL 50 MG/ML IJ SOLN
50.0000 mg | Freq: Once | INTRAMUSCULAR | Status: AC
  Administered 2015-03-15: 50 mg via INTRAVENOUS

## 2015-03-15 MED ORDER — SODIUM CHLORIDE 0.9 % IV SOLN
168.6000 mg | Freq: Once | INTRAVENOUS | Status: AC
  Administered 2015-03-15: 170 mg via INTRAVENOUS
  Filled 2015-03-15: qty 17

## 2015-03-15 MED ORDER — SODIUM CHLORIDE 0.9 % IJ SOLN
10.0000 mL | INTRAMUSCULAR | Status: DC | PRN
  Administered 2015-03-15: 10 mL
  Filled 2015-03-15: qty 10

## 2015-03-15 MED ORDER — DIPHENHYDRAMINE HCL 50 MG/ML IJ SOLN
INTRAMUSCULAR | Status: AC
Start: 2015-03-15 — End: 2015-03-15
  Filled 2015-03-15: qty 1

## 2015-03-15 MED ORDER — FAMOTIDINE IN NACL 20-0.9 MG/50ML-% IV SOLN
INTRAVENOUS | Status: AC
  Filled 2015-03-15: qty 50

## 2015-03-15 MED ORDER — FAMOTIDINE IN NACL 20-0.9 MG/50ML-% IV SOLN
20.0000 mg | Freq: Once | INTRAVENOUS | Status: AC
  Administered 2015-03-15: 20 mg via INTRAVENOUS

## 2015-03-15 NOTE — Progress Notes (Signed)
Oncology Nurse Navigator Documentation  Oncology Nurse Navigator Flowsheets 03/15/2015  Navigator Encounter Type Treatment/spoke to patient and sister today during tx.  He is doing well without noted side effects of chemotherapy.   I did ask him about his smoking.  He stated he quit.  His sister states she caught him smoking twice.  I asked that he call me if needed to help with smoking cessation.    Patient Visit Type Follow-up  Treatment Phase Treatment  Barriers/Navigation Needs Education  Education Smoking cessation  Time Spent with Patient 15

## 2015-03-15 NOTE — Progress Notes (Signed)
Nutrition follow-up completed with patient during chemotherapy for lung cancer. Weight remains stable at 129.9 pounds November 4. Patient reports his constipation has resolved. Patient is continuing oral nutrition supplements between meals.  Nutrition diagnosis: Food and nutrition related knowledge deficit has improved.  Intervention: Encouraged patient to continue strategies for adequate calories and protein, and to continue oral nutrition supplements 3 times a day between meals. Provided additional coupons. Educated patient on strategies for avoiding constipation.  I provided a fact sheet Questions were answered.  Teach back method used.  Monitoring, evaluation, goals: Patient will tolerate adequate calories and protein to maintain current weight.  Next visit: Monday, November 21 during infusion.  **Disclaimer: This note was dictated with voice recognition software. Similar sounding words can inadvertently be transcribed and this note may contain transcription errors which may not have been corrected upon publication of note.**

## 2015-03-15 NOTE — Patient Instructions (Signed)
[facility] Discharge Instructions for Patients Receiving Chemotherapy  Today you received the following chemotherapy agents Taxol and Carboplatin. To help prevent nausea and vomiting after your treatment, we encourage you to take your nausea medication as directed.  If you develop nausea and vomiting that is not controlled by your nausea medication, call the clinic.   BELOW ARE SYMPTOMS THAT SHOULD BE REPORTED IMMEDIATELY:  *FEVER GREATER THAN 100.5 F  *CHILLS WITH OR WITHOUT FEVER  NAUSEA AND VOMITING THAT IS NOT CONTROLLED WITH YOUR NAUSEA MEDICATION  *UNUSUAL SHORTNESS OF BREATH  *UNUSUAL BRUISING OR BLEEDING  TENDERNESS IN MOUTH AND THROAT WITH OR WITHOUT PRESENCE OF ULCERS  *URINARY PROBLEMS  *BOWEL PROBLEMS  UNUSUAL RASH Items with * indicate a potential emergency and should be followed up as soon as possible.  Feel free to call the clinic you have any questions or concerns. The clinic phone number is [phone].  Please show the CHEMO ALERT CARD at check-in to the Emergency Department and triage nurse.    

## 2015-03-16 ENCOUNTER — Ambulatory Visit
Admission: RE | Admit: 2015-03-16 | Discharge: 2015-03-16 | Disposition: A | Discharge status: Home / Self Care | Payer: Medicare Other | Source: Ambulatory Visit | Attending: Radiation Oncology | Admitting: Radiation Oncology

## 2015-03-16 DIAGNOSIS — Z510 Encounter for antineoplastic radiation therapy: Secondary | ICD-10-CM | POA: Diagnosis not present

## 2015-03-17 ENCOUNTER — Ambulatory Visit: Payer: Medicare Other

## 2015-03-17 DIAGNOSIS — Z510 Encounter for antineoplastic radiation therapy: Secondary | ICD-10-CM | POA: Diagnosis not present

## 2015-03-18 ENCOUNTER — Ambulatory Visit: Payer: Medicare Other

## 2015-03-18 DIAGNOSIS — Z510 Encounter for antineoplastic radiation therapy: Secondary | ICD-10-CM | POA: Diagnosis not present

## 2015-03-19 ENCOUNTER — Encounter: Payer: Self-pay | Admitting: Radiation Oncology

## 2015-03-19 ENCOUNTER — Ambulatory Visit
Admission: RE | Admit: 2015-03-19 | Discharge: 2015-03-19 | Disposition: A | Discharge status: Home / Self Care | Payer: Medicare Other | Source: Ambulatory Visit | Attending: Radiation Oncology | Admitting: Radiation Oncology

## 2015-03-19 VITALS — BP 118/75 | HR 110 | Temp 99.0°F | Ht 71.0 in | Wt 131.4 lb

## 2015-03-19 DIAGNOSIS — C3411 Malignant neoplasm of upper lobe, right bronchus or lung: Secondary | ICD-10-CM

## 2015-03-19 DIAGNOSIS — Z510 Encounter for antineoplastic radiation therapy: Secondary | ICD-10-CM | POA: Diagnosis not present

## 2015-03-19 NOTE — Progress Notes (Signed)
   Department of Radiation Oncology  Phone:  [phone] Fax:        [phone]  Weekly Treatment Note    Name: Ivan Daniels Date: 03/19/2015 MRN: [id number] DOB: [birth year]   Current dose: 30 Gy  Current fraction: 15   MEDICATIONS: Current Outpatient Prescriptions  Medication Sig Dispense Refill  . benztropine (COGENTIN) 0.5 MG tablet Take 0.5 mg by mouth at bedtime.     . clopidogrel (PLAVIX) 75 MG tablet     . ferrous fumarate (HEMOCYTE - 106 MG FE) 325 (106 FE) MG TABS tablet Take 1 tablet by mouth.    . haloperidol (HALDOL) 5 MG tablet Take 5 mg by mouth at bedtime.     . lidocaine-prilocaine (EMLA) cream Apply 1 application topically as needed. 30 g 0  . Multiple Vitamins-Minerals (MULTIVITAMIN WITH MINERALS) tablet Take 1 tablet by mouth daily.    . simvastatin (ZOCOR) 20 MG tablet     . Wound Dressings (SONAFINE) Apply 1 application topically 3 (three) times daily.    Marland Kitchen oxyCODONE (OXY IR/ROXICODONE) 5 MG immediate release tablet Take 1 tablet (5 mg total) by mouth every 3 (three) hours as needed for moderate pain. (Patient not taking: Reported on 03/05/2015) 30 tablet 0  . oxyCODONE 10 MG TABS Take 1 tablet (10 mg total) by mouth every 3 (three) hours as needed for severe pain. (Patient not taking: Reported on 03/05/2015) 30 tablet 0  . prochlorperazine (COMPAZINE) 10 MG tablet Take 1 tablet (10 mg total) by mouth every 6 (six) hours as needed for nausea or vomiting. (Patient not taking: Reported on 03/05/2015) 30 tablet 0   No current facility-administered medications for this encounter.     ALLERGIES: Benztropine mesylate and Chlorpromazine hcl   LABORATORY DATA:  Lab Results  Component Value Date   WBC 3.6* 03/15/2015   HGB 9.2* 03/15/2015   HCT 28.4* 03/15/2015   MCV 91.0 03/15/2015   PLT 288 03/15/2015   Lab Results  Component Value Date   NA 138 03/15/2015   K 3.8 03/15/2015   CL 101 01/26/2015   CO2 27 03/15/2015   Lab Results  Component  Value Date   ALT 12 03/15/2015   AST 16 03/15/2015   ALKPHOS 51 03/15/2015   BILITOT <0.30 03/15/2015     NARRATIVE: Ivan Daniels was seen today for weekly treatment management. The chart was checked and the patient's films were reviewed.  Ivan Daniels here today has received 15 fractions to right lung.  Denies any pain or SOB today.  Noted mild tanning of skin with dryness of upper back.  BP 118/75 mmHg  Pulse 110  Temp(Src) 99 F (37.2 C)  Ht '5\' 11"'$  (1.803 m)  Wt 131 lb 6.4 oz (59.603 kg)  BMI 18.33 kg/m2  SpO2 100%   Wt Readings from Last 3 Encounters:  03/19/15 131 lb 6.4 oz (59.603 kg)  03/12/15 129 lb 14.4 oz (58.922 kg)  03/09/15 130 lb (58.968 kg)    PHYSICAL EXAMINATION: height is '5\' 11"'$  (1.803 m) and weight is 131 lb 6.4 oz (59.603 kg). His temperature is 99 F (37.2 C). His blood pressure is 118/75 and his pulse is 110. His oxygen saturation is 100%.        ASSESSMENT: The patient is doing satisfactorily with treatment.  PLAN: We will continue with the patient's radiation treatment as planned.

## 2015-03-19 NOTE — Progress Notes (Signed)
error 

## 2015-03-19 NOTE — Progress Notes (Addendum)
Ivan Daniels here today has received 15 fractions to right lung.  Denies any pain or SOB today.  Noted mild tanning of skin with dryness of upper back.  BP 118/75 mmHg  Pulse 110  Temp(Src) 99 F (37.2 C)  Ht '5\' 11"'$  (1.803 m)  Wt 131 lb 6.4 oz (59.603 kg)  BMI 18.33 kg/m2  SpO2 100%   Wt Readings from Last 3 Encounters:  03/19/15 131 lb 6.4 oz (59.603 kg)  03/12/15 129 lb 14.4 oz (58.922 kg)  03/09/15 130 lb (58.968 kg)

## 2015-03-22 ENCOUNTER — Ambulatory Visit (HOSPITAL_BASED_OUTPATIENT_CLINIC_OR_DEPARTMENT_OTHER): Payer: Medicare Other

## 2015-03-22 ENCOUNTER — Encounter: Payer: Self-pay | Admitting: *Deleted

## 2015-03-22 ENCOUNTER — Other Ambulatory Visit (HOSPITAL_BASED_OUTPATIENT_CLINIC_OR_DEPARTMENT_OTHER): Payer: Medicare Other

## 2015-03-22 ENCOUNTER — Ambulatory Visit: Payer: Medicare Other

## 2015-03-22 ENCOUNTER — Other Ambulatory Visit: Payer: Medicare Other

## 2015-03-22 ENCOUNTER — Ambulatory Visit (HOSPITAL_BASED_OUTPATIENT_CLINIC_OR_DEPARTMENT_OTHER): Payer: Medicare Other | Admitting: Physician Assistant

## 2015-03-22 VITALS — BP 117/68 | HR 103 | Temp 97.9°F | Resp 20 | Ht 71.0 in | Wt 128.4 lb

## 2015-03-22 DIAGNOSIS — Z510 Encounter for antineoplastic radiation therapy: Secondary | ICD-10-CM | POA: Diagnosis not present

## 2015-03-22 DIAGNOSIS — C3411 Malignant neoplasm of upper lobe, right bronchus or lung: Secondary | ICD-10-CM

## 2015-03-22 DIAGNOSIS — Z720 Tobacco use: Secondary | ICD-10-CM | POA: Diagnosis not present

## 2015-03-22 DIAGNOSIS — Z5111 Encounter for antineoplastic chemotherapy: Secondary | ICD-10-CM

## 2015-03-22 DIAGNOSIS — C3490 Malignant neoplasm of unspecified part of unspecified bronchus or lung: Secondary | ICD-10-CM

## 2015-03-22 DIAGNOSIS — C3491 Malignant neoplasm of unspecified part of right bronchus or lung: Secondary | ICD-10-CM

## 2015-03-22 LAB — CBC WITH DIFFERENTIAL/PLATELET
BASO%: 0.4 % (ref 0.0–2.0)
Basophils Absolute: 0 10*3/uL (ref 0.0–0.1)
EOS%: 0 % (ref 0.0–7.0)
Eosinophils Absolute: 0 10*3/uL (ref 0.0–0.5)
HCT: 29.8 % — ABNORMAL LOW (ref 38.4–49.9)
HGB: 9.6 g/dL — ABNORMAL LOW (ref 13.0–17.1)
LYMPH%: 16.7 % (ref 14.0–49.0)
MCH: 29.3 pg (ref 27.2–33.4)
MCHC: 32.2 g/dL (ref 32.0–36.0)
MCV: 90.9 fL (ref 79.3–98.0)
MONO#: 0.2 10*3/uL (ref 0.1–0.9)
MONO%: 8 % (ref 0.0–14.0)
NEUT#: 2.1 10*3/uL (ref 1.5–6.5)
NEUT%: 74.9 % (ref 39.0–75.0)
Platelets: 311 10*3/uL (ref 140–400)
RBC: 3.28 10*6/uL — ABNORMAL LOW (ref 4.20–5.82)
RDW: 15.8 % — ABNORMAL HIGH (ref 11.0–14.6)
WBC: 2.8 10*3/uL — ABNORMAL LOW (ref 4.0–10.3)
lymph#: 0.5 10*3/uL — ABNORMAL LOW (ref 0.9–3.3)

## 2015-03-22 LAB — COMPREHENSIVE METABOLIC PANEL (CC13)
ALT: 13 U/L (ref 0–55)
AST: 16 U/L (ref 5–34)
Albumin: 3.5 g/dL (ref 3.5–5.0)
Alkaline Phosphatase: 54 U/L (ref 40–150)
Anion Gap: 11 mEq/L (ref 3–11)
BUN: 10.4 mg/dL (ref 7.0–26.0)
CO2: 27 mEq/L (ref 22–29)
Calcium: 9.9 mg/dL (ref 8.4–10.4)
Chloride: 102 mEq/L (ref 98–109)
Creatinine: 1 mg/dL (ref 0.7–1.3)
EGFR: >90 mL/min/{1.73_m2} (ref >90)
Glucose: 96 mg/dl (ref 70–140)
Potassium: 4 mEq/L (ref 3.5–5.1)
Sodium: 140 mEq/L (ref 136–145)
Total Bilirubin: <0.3 mg/dL (ref 0.20–1.20)
Total Protein: 7.6 g/dL (ref 6.4–8.3)

## 2015-03-22 MED ORDER — FAMOTIDINE IN NACL 20-0.9 MG/50ML-% IV SOLN
INTRAVENOUS | Status: AC
  Filled 2015-03-22: qty 50

## 2015-03-22 MED ORDER — DIPHENHYDRAMINE HCL 50 MG/ML IJ SOLN
50.0000 mg | Freq: Once | INTRAMUSCULAR | Status: AC
  Administered 2015-03-22: 50 mg via INTRAVENOUS

## 2015-03-22 MED ORDER — HEPARIN SOD (PORK) LOCK FLUSH 100 UNIT/ML IV SOLN
500.0000 [IU] | Freq: Once | INTRAVENOUS | Status: AC | PRN
  Administered 2015-03-22: 500 [IU]
  Filled 2015-03-22: qty 5

## 2015-03-22 MED ORDER — FAMOTIDINE IN NACL 20-0.9 MG/50ML-% IV SOLN
20.0000 mg | Freq: Once | INTRAVENOUS | Status: AC
Start: 2015-03-22 — End: 2015-03-22
  Administered 2015-03-22: 20 mg via INTRAVENOUS

## 2015-03-22 MED ORDER — PACLITAXEL CHEMO INJECTION 300 MG/50ML
45.0000 mg/m2 | Freq: Once | INTRAVENOUS | Status: AC
  Administered 2015-03-22: 78 mg via INTRAVENOUS
  Filled 2015-03-22: qty 13

## 2015-03-22 MED ORDER — SODIUM CHLORIDE 0.9 % IV SOLN
168.6000 mg | Freq: Once | INTRAVENOUS | Status: AC
  Administered 2015-03-22: 170 mg via INTRAVENOUS
  Filled 2015-03-22: qty 17

## 2015-03-22 MED ORDER — SODIUM CHLORIDE 0.9 % IV SOLN
Freq: Once | INTRAVENOUS | Status: AC
  Administered 2015-03-22: 10:00:00 via INTRAVENOUS

## 2015-03-22 MED ORDER — SODIUM CHLORIDE 0.9 % IJ SOLN
10.0000 mL | INTRAMUSCULAR | Status: DC | PRN
  Administered 2015-03-22: 10 mL
  Filled 2015-03-22: qty 10

## 2015-03-22 MED ORDER — DIPHENHYDRAMINE HCL 50 MG/ML IJ SOLN
INTRAMUSCULAR | Status: AC
  Filled 2015-03-22: qty 1

## 2015-03-22 MED ORDER — SODIUM CHLORIDE 0.9 % IV SOLN
Freq: Once | INTRAVENOUS | Status: AC
  Administered 2015-03-22: 10:00:00 via INTRAVENOUS
  Filled 2015-03-22: qty 8

## 2015-03-22 NOTE — Patient Instructions (Signed)
[facility] Discharge Instructions for Patients Receiving Chemotherapy  Today you received the following chemotherapy agents Taxol and Carboplatin. To help prevent nausea and vomiting after your treatment, we encourage you to take your nausea medication as directed.  If you develop nausea and vomiting that is not controlled by your nausea medication, call the clinic.   BELOW ARE SYMPTOMS THAT SHOULD BE REPORTED IMMEDIATELY:  *FEVER GREATER THAN 100.5 F  *CHILLS WITH OR WITHOUT FEVER  NAUSEA AND VOMITING THAT IS NOT CONTROLLED WITH YOUR NAUSEA MEDICATION  *UNUSUAL SHORTNESS OF BREATH  *UNUSUAL BRUISING OR BLEEDING  TENDERNESS IN MOUTH AND THROAT WITH OR WITHOUT PRESENCE OF ULCERS  *URINARY PROBLEMS  *BOWEL PROBLEMS  UNUSUAL RASH Items with * indicate a potential emergency and should be followed up as soon as possible.  Feel free to call the clinic you have any questions or concerns. The clinic phone number is [phone].  Please show the CHEMO ALERT CARD at check-in to the Emergency Department and triage nurse.    

## 2015-03-22 NOTE — Progress Notes (Signed)
Oncology Nurse Navigator Documentation  Oncology Nurse Navigator Flowsheets 03/22/2015  Navigator Encounter Type Treatment/ I received a phone call from Mr. Ivan Daniels sister who was in the waiting room.  She stated he is still smoking and needs help to quit.  I spoke with patient in chemo and his daughter beside him.  I gave him outreach contact information to get involved with a class.  He stated he would call.  I then went to waiting room to speak with Mr. Ivan Daniels sister. She was thankful for the help.  She stated she has a lot on her plate and has taken care of 3 family members with cancer.  I asked her if she needed to talk we have CSW on staff to help deal with stress.  I stated she needs to take care of herself too.  She was again thankful for the help.  I will update CSW.    Patient Visit Type Follow-up  Treatment Phase Treatment  Barriers/Navigation Needs Education  Education Smoking cessation  Time Spent with Patient 30

## 2015-03-22 NOTE — Progress Notes (Signed)
[facility] [phone]   [phone]  OFFICE PROGRESS NOTE  [name], MD 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  PRIOR THERAPY: None.  CURRENT THERAPY: Course of concurrent chemoradiation with weekly carboplatin for AUC of 2 and paclitaxel 45 MG/M2. First dose 03/01/2015. He is status post 3 cycles.  INTERVAL HISTORY: Ivan Daniels 69 y.o. male returns to the clinic today for follow-up visit accompanied by his wife. The patient tolerated the 3rd cycle of his treatment fairly well with no significant adverse effects. He is also tolerating his radiation treatments.  He denied having any significant fever or chills. His cough has improved with therapy. He has no nausea or vomiting. His appetite is normal with stable weight, drinking plenty nutritional supplements daily.He denies any changes in bowel habits. He quit smoking last week. He denied having any significant chest pain, shortness breath, cough or hemoptysis. He denies any skin rashes or irritation. He continues to smoke. He is here today to start cycle #4 of his chemotherapy.  MEDICAL HISTORY: Past Medical History  Diagnosis Date  . Hyperlipidemia   . TOBACCO ABUSE 05/24/2009    Qualifier: Diagnosis of  By: Hassell Done FNP, Tori Milks    . Paranoid schizophrenia ([place]) 10/31/2009    Qualifier: Diagnosis of  By: Jorene Minors, [place]    . BENIGN PROSTATIC HYPERTROPHY, HX OF 12/19/2006    Qualifier: Diagnosis of  By: Radene Ou MD, [place]    . Primary spontaneous pneumothorax, left 01/25/2015  . Lung mass 01/25/2015    3.7 x 3.6 cm RUL spiculated lung mass with 2.0 cm right paratracheal lymph node suspicious for bronchogenic CA  . Full code status  02/10/2015  . Allergy   . ALCOHOL ABUSE 11/12/2009    Qualifier: Diagnosis of  By: Hassell Done FNP, Tori Milks    . Non-small cell lung cancer ([place]) 01/27/15    non small-cell,squamous cell ca RUL    ALLERGIES:  is allergic to benztropine mesylate and chlorpromazine hcl.  MEDICATIONS:  Current Outpatient Prescriptions  Medication Sig Dispense Refill  . benztropine (COGENTIN) 0.5 MG tablet Take 0.5 mg by mouth at bedtime.     . clopidogrel (PLAVIX) 75 MG tablet     . ferrous fumarate (HEMOCYTE - 106 MG FE) 325 (106 FE) MG TABS tablet Take 1 tablet by mouth.    . haloperidol (HALDOL) 5 MG tablet Take 5 mg by mouth at bedtime.     . lidocaine-prilocaine (EMLA) cream Apply 1 application topically as needed. 30 g 0  . Multiple Vitamins-Minerals (MULTIVITAMIN WITH MINERALS) tablet Take 1 tablet by mouth daily.    Marland Kitchen oxyCODONE (OXY IR/ROXICODONE) 5 MG immediate release tablet Take 1 tablet (5 mg total) by mouth every 3 (three) hours as needed for moderate pain. (Patient not taking: Reported on 03/05/2015) 30 tablet 0  . oxyCODONE 10 MG TABS Take 1 tablet (10 mg total) by mouth every 3 (three) hours as needed for severe pain. (Patient not taking: Reported on 03/05/2015) 30 tablet 0  . prochlorperazine (COMPAZINE) 10 MG tablet Take 1 tablet (10 mg total) by mouth every 6 (six) hours as needed for nausea or vomiting. (Patient not taking: Reported on 03/05/2015) 30 tablet 0  . simvastatin (ZOCOR) 20 MG tablet     .  Wound Dressings (SONAFINE) Apply 1 application topically 3 (three) times daily.     No current facility-administered medications for this visit.    SURGICAL HISTORY:  Past Surgical History  Procedure Laterality Date  . Video bronchoscopy with endobronchial ultrasound N/A 01/27/2015    Procedure: VIDEO BRONCHOSCOPY WITH ENDOBRONCHIAL ULTRASOUND;  Surgeon: Grace Isaac, MD;  Location: [facility];  Service: Thoracic;  Laterality: N/A;  . Lung biopsy Right 01/27/2015    Procedure: Right Upper Lobe  Bronchus BIOPSY;  Surgeon: Grace Isaac, MD;  Location: [place];  Service: Thoracic;  Laterality: Right;    REVIEW OF SYSTEMS:  A comprehensive review of systems was negative.   PHYSICAL EXAMINATION: General appearance: alert, cooperative and no distress Head: Normocephalic, without obvious abnormality, atraumatic Neck: no adenopathy, no JVD, supple, symmetrical, trachea midline and thyroid not enlarged, symmetric, no tenderness/mass/nodules Lymph nodes: Cervical, supraclavicular, and axillary nodes normal. Resp: clear to auscultation bilaterally Back: symmetric, no curvature. ROM normal. No CVA tenderness. Cardio: regular rate and rhythm, S1, S2 normal, no murmur, click, rub or gallop GI: soft, non-tender; bowel sounds normal; no masses,  no organomegaly Extremities: extremities normal, atraumatic, no cyanosis or edema  ECOG PERFORMANCE STATUS: 1 - Symptomatic but completely ambulatory  Blood pressure 117/68, pulse 103, temperature 97.9 F (36.6 C), temperature source Oral, resp. rate 20, height '5\' 11"'$  (1.803 m), weight 128 lb 6.4 oz (58.242 kg), SpO2 100 %.  LABORATORY DATA: CBC Latest Ref Rng 03/22/2015 03/15/2015 03/09/2015  WBC 4.0 - 10.3 10e3/uL 2.8(L) 3.6(L) 9.1  Hemoglobin 13.0 - 17.1 g/dL 9.6(L) 9.2(L) 10.1(L)  Hematocrit 38.4 - 49.9 % 29.8(L) 28.4(L) 31.0(L)  Platelets 140 - 400 10e3/uL 311 288 317   CMP Latest Ref Rng 03/15/2015 03/08/2015 03/01/2015  Glucose 70 - 140 mg/dl 114 86 124  BUN 7.0 - 26.0 mg/dL 9.0 8.2 9.7  Creatinine 0.7 - 1.3 mg/dL 0.8 0.9 1.1  Sodium 136 - 145 mEq/L 138 138 141  Potassium 3.5 - 5.1 mEq/L 3.8 4.1 4.1  Chloride 101 - 111 mmol/L - - -  CO2 22 - 29 mEq/L '27 26 29  '$ Calcium 8.4 - 10.4 mg/dL 9.6 9.7 10.2  Total Protein 6.4 - 8.3 g/dL 7.2 7.9 7.8  Total Bilirubin 0.20 - 1.20 mg/dL <0.30 0.35 <0.30  Alkaline Phos 40 - 150 U/L 51 53 56  AST 5 - 34 U/L '16 19 20  '$ ALT 0 - 55 U/L '12 13 9    '$ RADIOGRAPHIC STUDIES: Ir Fluoro Guide Cv Line  Right  03/09/2015  CLINICAL DATA:  Metastatic right lung non-small cell carcinoma, needs venous access for chemotherapy EXAM: TUNNELED PORT CATHETER PLACEMENT WITH ULTRASOUND AND FLUOROSCOPIC GUIDANCE FLUOROSCOPY TIME:  0.2 minutes, 15  uGym2 DAP ANESTHESIA/SEDATION: Intravenous Fentanyl and Versed were administered as conscious sedation during continuous cardiorespiratory monitoring by the radiology RN, with a total moderate sedation time of 14 minutes. TECHNIQUE: The procedure, risks, benefits, and alternatives were explained to the patient. Questions regarding the procedure were encouraged and answered. The patient understands and consents to the procedure. As antibiotic prophylaxis, cefazolin 2 g was ordered pre-procedure and administered intravenously within one hour of incision. Patency of the right IJ vein was confirmed with ultrasound with image documentation. An appropriate skin site was determined. Skin site was marked. Region was prepped using maximum barrier technique including cap and mask, sterile gown, sterile gloves, large sterile sheet, and Chlorhexidine as cutaneous antisepsis. The region was infiltrated locally with 1% lidocaine. Under real-time ultrasound guidance, the right  IJ vein was accessed with a 21 gauge micropuncture needle; the needle tip within the vein was confirmed with ultrasound image documentation. Needle was exchanged over a 018 guidewire for transitional dilator which allowed passage of the [facility] wire into the IVC. Over this, the transitional dilator was exchanged for a 5 [place] MPA catheter. A small incision was made on the right anterior chest wall and a subcutaneous pocket fashioned. The power-injectable port was positioned and its catheter tunneled to the right IJ dermatotomy site. The MPA catheter was exchanged over an Amplatz wire for a peel-away sheath, through which the port catheter, which had been trimmed to the appropriate length, was advanced and positioned under  fluoroscopy with its tip at the cavoatrial junction. Spot chest radiograph confirms good catheter position and no pneumothorax. The pocket was closed with deep interrupted and subcuticular continuous 3-0 Monocryl sutures. The port was flushed per protocol. The incisions were covered with Dermabond then covered with a sterile dressing. COMPLICATIONS: COMPLICATIONS None immediate IMPRESSION: Technically successful right IJ power-injectable port catheter placement. Ready for routine use. Electronically Signed   By: Lucrezia Europe M.D.   On: 03/09/2015 15:16   Ir US Guide Vasc Access Right  03/09/2015  CLINICAL DATA:  Metastatic right lung non-small cell carcinoma, needs venous access for chemotherapy EXAM: TUNNELED PORT CATHETER PLACEMENT WITH ULTRASOUND AND FLUOROSCOPIC GUIDANCE FLUOROSCOPY TIME:  0.2 minutes, 15  uGym2 DAP ANESTHESIA/SEDATION: Intravenous Fentanyl and Versed were administered as conscious sedation during continuous cardiorespiratory monitoring by the radiology RN, with a total moderate sedation time of 14 minutes. TECHNIQUE: The procedure, risks, benefits, and alternatives were explained to the patient. Questions regarding the procedure were encouraged and answered. The patient understands and consents to the procedure. As antibiotic prophylaxis, cefazolin 2 g was ordered pre-procedure and administered intravenously within one hour of incision. Patency of the right IJ vein was confirmed with ultrasound with image documentation. An appropriate skin site was determined. Skin site was marked. Region was prepped using maximum barrier technique including cap and mask, sterile gown, sterile gloves, large sterile sheet, and Chlorhexidine as cutaneous antisepsis. The region was infiltrated locally with 1% lidocaine. Under real-time ultrasound guidance, the right IJ vein was accessed with a 21 gauge micropuncture needle; the needle tip within the vein was confirmed with ultrasound image documentation. Needle  was exchanged over a 018 guidewire for transitional dilator which allowed passage of the [facility] wire into the IVC. Over this, the transitional dilator was exchanged for a 5 [place] MPA catheter. A small incision was made on the right anterior chest wall and a subcutaneous pocket fashioned. The power-injectable port was positioned and its catheter tunneled to the right IJ dermatotomy site. The MPA catheter was exchanged over an Amplatz wire for a peel-away sheath, through which the port catheter, which had been trimmed to the appropriate length, was advanced and positioned under fluoroscopy with its tip at the cavoatrial junction. Spot chest radiograph confirms good catheter position and no pneumothorax. The pocket was closed with deep interrupted and subcuticular continuous 3-0 Monocryl sutures. The port was flushed per protocol. The incisions were covered with Dermabond then covered with a sterile dressing. COMPLICATIONS: COMPLICATIONS None immediate IMPRESSION: Technically successful right IJ power-injectable port catheter placement. Ready for routine use. Electronically Signed   By: Lucrezia Europe M.D.   On: 03/09/2015 15:16    ASSESSMENT AND PLAN: This is a very pleasant 69 years old African-American male with a stage IIIa non-small cell lung cancer and started on concurrent  chemoradiation with weekly carboplatin and paclitaxel. Status post week 3 of treatment. The patient is feeling fine today with no specific complaints. I recommended for the patient to proceed with cycle #4 today as a scheduled. He would come back for follow-up visit in 2 weeks for reevaluation and management of any adverse effect of his treatment. He was once again advised to discontinue smoking. The patient was advised to call immediately if he has any concerning symptoms in the interval. The patient voices understanding of current disease status and treatment options and is in agreement with the current care plan.  All questions were  answered. The patient knows to call the clinic with any problems, questions or concerns. We can certainly see the patient much sooner if necessary.   ADDENDUM: Hematology/Oncology Attending: I had a face to face encounter with the patient today. I recommended his care plan. This is a very pleasant 69 years old American male with a stage IIIa non-small cell lung cancer, squamous cell carcinoma who is currently undergoing a course of concurrent chemoradiation with weekly carboplatin and paclitaxel is status post 3 cycles. The patient has been tolerating his treatment fairly well with no significant adverse effects. Unfortunately continues to smoke a few cigarettes every day and we strongly advise him to quit smoking. I recommended for the patient to proceed with cycle #4 today as a scheduled. He would come back for follow-up visit in 2 weeks for reevaluation and management of any adverse effect of his treatment. The patient was advised to call immediately if he has any concerning symptoms in the interval.  Disclaimer: This note was dictated with voice recognition software. Similar sounding words can inadvertently be transcribed and may be missed upon review.  Eilleen Kempf., MD 03/22/2015

## 2015-03-23 ENCOUNTER — Ambulatory Visit: Payer: Medicare Other

## 2015-03-23 DIAGNOSIS — Z510 Encounter for antineoplastic radiation therapy: Secondary | ICD-10-CM | POA: Diagnosis not present

## 2015-03-24 ENCOUNTER — Ambulatory Visit: Payer: Medicare Other

## 2015-03-24 DIAGNOSIS — Z510 Encounter for antineoplastic radiation therapy: Secondary | ICD-10-CM | POA: Diagnosis not present

## 2015-03-25 ENCOUNTER — Ambulatory Visit: Payer: Medicare Other

## 2015-03-25 DIAGNOSIS — Z510 Encounter for antineoplastic radiation therapy: Secondary | ICD-10-CM | POA: Diagnosis not present

## 2015-03-26 ENCOUNTER — Ambulatory Visit
Admission: RE | Admit: 2015-03-26 | Discharge: 2015-03-26 | Disposition: A | Discharge status: Home / Self Care | Payer: Medicare Other | Source: Ambulatory Visit | Attending: Radiation Oncology | Admitting: Radiation Oncology

## 2015-03-26 ENCOUNTER — Encounter: Payer: Self-pay | Admitting: Radiation Oncology

## 2015-03-26 VITALS — BP 101/52 | HR 63 | Temp 98.9°F | Resp 20 | Wt 132.2 lb

## 2015-03-26 DIAGNOSIS — C3411 Malignant neoplasm of upper lobe, right bronchus or lung: Secondary | ICD-10-CM

## 2015-03-26 DIAGNOSIS — Z510 Encounter for antineoplastic radiation therapy: Secondary | ICD-10-CM | POA: Diagnosis not present

## 2015-03-26 NOTE — Progress Notes (Signed)
Weekly rad tx right lung 20 completed,  No skin chnages just dryness, using sonafne cream bid,   No nausea, has dry non productive cough, stnagled eating hamburger with tomato and lettuce last night,  Ate corn flakes this am no problem ,lunch chicken pot pie , bottled water no difficulty BP 101/52 mmHg  Pulse 63  Temp(Src) 98.9 F (37.2 C) (Oral)  Resp 20  Wt 132 lb 3.2 oz (59.966 kg)  SpO2 100%  Wt Readings from Last 3 Encounters:  03/26/15 132 lb 3.2 oz (59.966 kg)  03/22/15 128 lb 6.4 oz (58.242 kg)  03/19/15 131 lb 6.4 oz (59.603 kg)

## 2015-03-26 NOTE — Progress Notes (Signed)
Department of Radiation Oncology  Phone:  [phone] Fax:        [phone]  Weekly Treatment Note    Name: Ivan Daniels Date: 03/26/2015 MRN: [id number] DOB: 1945/11/04   Current dose: 40 Gy  Current fraction: 20   MEDICATIONS: Current Outpatient Prescriptions  Medication Sig Dispense Refill  . benztropine (COGENTIN) 0.5 MG tablet Take 0.5 mg by mouth at bedtime.     . clopidogrel (PLAVIX) 75 MG tablet     . ferrous fumarate (HEMOCYTE - 106 MG FE) 325 (106 FE) MG TABS tablet Take 1 tablet by mouth.    . haloperidol (HALDOL) 5 MG tablet Take 5 mg by mouth at bedtime.     . lidocaine-prilocaine (EMLA) cream Apply 1 application topically as needed. 30 g 0  . Multiple Vitamins-Minerals (MULTIVITAMIN WITH MINERALS) tablet Take 1 tablet by mouth daily.    . simvastatin (ZOCOR) 20 MG tablet     . Wound Dressings (SONAFINE) Apply 1 application topically 3 (three) times daily.    Marland Kitchen oxyCODONE (OXY IR/ROXICODONE) 5 MG immediate release tablet Take 1 tablet (5 mg total) by mouth every 3 (three) hours as needed for moderate pain. (Patient not taking: Reported on 03/05/2015) 30 tablet 0  . oxyCODONE 10 MG TABS Take 1 tablet (10 mg total) by mouth every 3 (three) hours as needed for severe pain. (Patient not taking: Reported on 03/05/2015) 30 tablet 0  . prochlorperazine (COMPAZINE) 10 MG tablet Take 1 tablet (10 mg total) by mouth every 6 (six) hours as needed for nausea or vomiting. (Patient not taking: Reported on 03/05/2015) 30 tablet 0   No current facility-administered medications for this encounter.     ALLERGIES: Benztropine mesylate and Chlorpromazine hcl   LABORATORY DATA:  Lab Results  Component Value Date   WBC 2.8* 03/22/2015   HGB 9.6* 03/22/2015   HCT 29.8* 03/22/2015   MCV 90.9 03/22/2015   PLT 311 03/22/2015   Lab Results  Component Value Date   NA 140 03/22/2015   K 4.0 03/22/2015   CL 101 01/26/2015   CO2 27 03/22/2015   Lab Results  Component  Value Date   ALT 13 03/22/2015   AST 16 03/22/2015   ALKPHOS 54 03/22/2015   BILITOT <0.30 03/22/2015     NARRATIVE: Ivan Daniels was seen today for weekly treatment management. The chart was checked and the patient's films were reviewed.  Ivan Daniels here today has received 20 fractions to right lung. He has not had much difficulty swallowing. His breathing is good. He has been coughing at night and takes cough medicine for it. He has no skin changes, just dryness. He uses Sonafine cream twice a day. He denies nausea. He drank bottled water with no difficulty.  BP 101/52 mmHg  Pulse 63  Temp(Src) 98.9 F (37.2 C) (Oral)  Resp 20  Wt 132 lb 3.2 oz (59.966 kg)  SpO2 100%   Wt Readings from Last 3 Encounters:  03/26/15 132 lb 3.2 oz (59.966 kg)  03/22/15 128 lb 6.4 oz (58.242 kg)  03/19/15 131 lb 6.4 oz (59.603 kg)    PHYSICAL EXAMINATION: weight is 132 lb 3.2 oz (59.966 kg). His oral temperature is 98.9 F (37.2 C). His blood pressure is 101/52 and his pulse is 63. His respiration is 20 and oxygen saturation is 100%.        ASSESSMENT: The patient is doing satisfactorily with treatment.  PLAN: We will continue with the patient's radiation treatment  as planned.        This document serves as a record of services personally performed by Kyung Rudd, MD. It was created on his behalf by  Lendon Collar, a trained medical scribe. The creation of this record is based on the scribe's personal observations and the provider's statements to them. This document has been checked and approved by the attending provider.

## 2015-03-28 ENCOUNTER — Ambulatory Visit
Admission: RE | Admit: 2015-03-28 | Discharge: 2015-03-28 | Disposition: A | Discharge status: Home / Self Care | Payer: Medicare Other | Source: Ambulatory Visit | Attending: Radiation Oncology | Admitting: Radiation Oncology

## 2015-03-28 DIAGNOSIS — Z510 Encounter for antineoplastic radiation therapy: Secondary | ICD-10-CM | POA: Diagnosis not present

## 2015-03-29 ENCOUNTER — Encounter: Payer: Medicare Other | Admitting: Nutrition

## 2015-03-29 ENCOUNTER — Other Ambulatory Visit (HOSPITAL_BASED_OUTPATIENT_CLINIC_OR_DEPARTMENT_OTHER): Payer: Medicare Other

## 2015-03-29 ENCOUNTER — Other Ambulatory Visit: Payer: Medicare Other

## 2015-03-29 ENCOUNTER — Ambulatory Visit: Payer: Medicare Other | Admitting: Physician Assistant

## 2015-03-29 ENCOUNTER — Encounter: Payer: Self-pay | Admitting: Nutrition

## 2015-03-29 ENCOUNTER — Ambulatory Visit: Payer: Medicare Other

## 2015-03-29 ENCOUNTER — Ambulatory Visit
Admission: RE | Admit: 2015-03-29 | Discharge: 2015-03-29 | Disposition: A | Discharge status: Home / Self Care | Payer: Medicare Other | Source: Ambulatory Visit | Attending: Radiation Oncology | Admitting: Radiation Oncology

## 2015-03-29 DIAGNOSIS — C3490 Malignant neoplasm of unspecified part of unspecified bronchus or lung: Secondary | ICD-10-CM | POA: Diagnosis not present

## 2015-03-29 DIAGNOSIS — Z510 Encounter for antineoplastic radiation therapy: Secondary | ICD-10-CM | POA: Diagnosis not present

## 2015-03-29 LAB — CBC WITH DIFFERENTIAL/PLATELET
BASO%: 1.3 % (ref 0.0–2.0)
Basophils Absolute: 0 10*3/uL (ref 0.0–0.1)
EOS%: 0.6 % (ref 0.0–7.0)
Eosinophils Absolute: 0 10*3/uL (ref 0.0–0.5)
HCT: 26.4 % — ABNORMAL LOW (ref 38.4–49.9)
HGB: 8.6 g/dL — ABNORMAL LOW (ref 13.0–17.1)
LYMPH%: 26.4 % (ref 14.0–49.0)
MCH: 29.3 pg (ref 27.2–33.4)
MCHC: 32.6 g/dL (ref 32.0–36.0)
MCV: 89.8 fL (ref 79.3–98.0)
MONO#: 0.2 10*3/uL (ref 0.1–0.9)
MONO%: 14.5 % — ABNORMAL HIGH (ref 0.0–14.0)
NEUT#: 0.9 10*3/uL — ABNORMAL LOW (ref 1.5–6.5)
NEUT%: 57.2 % (ref 39.0–75.0)
Platelets: 198 10*3/uL (ref 140–400)
RBC: 2.94 10*6/uL — ABNORMAL LOW (ref 4.20–5.82)
RDW: 16.3 % — ABNORMAL HIGH (ref 11.0–14.6)
WBC: 1.6 10*3/uL — ABNORMAL LOW (ref 4.0–10.3)
lymph#: 0.4 10*3/uL — ABNORMAL LOW (ref 0.9–3.3)
nRBC: 0 % (ref 0–0)

## 2015-03-29 LAB — COMPREHENSIVE METABOLIC PANEL (CC13)
ALT: 13 U/L (ref 0–55)
AST: 20 U/L (ref 5–34)
Albumin: 3.5 g/dL (ref 3.5–5.0)
Alkaline Phosphatase: 54 U/L (ref 40–150)
Anion Gap: 11 mEq/L (ref 3–11)
BUN: 9.4 mg/dL (ref 7.0–26.0)
CO2: 23 mEq/L (ref 22–29)
Calcium: 9.7 mg/dL (ref 8.4–10.4)
Chloride: 104 mEq/L (ref 98–109)
Creatinine: 0.9 mg/dL (ref 0.7–1.3)
EGFR: >90 mL/min/{1.73_m2} (ref >90)
Glucose: 87 mg/dl (ref 70–140)
Potassium: 3.9 mEq/L (ref 3.5–5.1)
Sodium: 138 mEq/L (ref 136–145)
Total Bilirubin: <0.3 mg/dL (ref 0.20–1.20)
Total Protein: 7.4 g/dL (ref 6.4–8.3)

## 2015-03-29 NOTE — Patient Instructions (Signed)
Neutropenia Neutropenia is a condition that occurs when the level of a certain type of white blood cell (neutrophil) in your body becomes lower than normal. Neutrophils are made in the bone marrow and fight infections. These cells protect against bacteria and viruses. The fewer neutrophils you have, and the longer your body remains without them, the greater your risk of getting a severe infection becomes. CAUSES  The cause of neutropenia may be hard to determine. However, it is usually due to 3 main problems:   Decreased production of neutrophils. This may be due to:  Certain medicines such as chemotherapy.  Genetic problems.  Cancer.  Radiation treatments.  Vitamin deficiency.  Some pesticides.  Increased destruction of neutrophils. This may be due to:  Overwhelming infections.  Hemolytic anemia. This is when the body destroys its own blood cells.  Chemotherapy.  Neutrophils moving to areas of the body where they cannot fight infections. This may be due to:  Dialysis procedures.  Conditions where the spleen becomes enlarged. Neutrophils are held in the spleen and are not available to the rest of the body.  Overwhelming infections. The neutrophils are held in the area of the infection and are not available to the rest of the body. SYMPTOMS  There are no specific symptoms of neutropenia. The lack of neutrophils can result in an infection, and an infection can cause various problems. DIAGNOSIS  Diagnosis is made by a blood test. A complete blood count is performed. The normal level of neutrophils in human blood differs with age and race. Infants have lower counts than older children and adults. African Americans have lower counts than Caucasians or Asians. The average adult level is 1500 cells/mm3 of blood. Neutrophil counts are interpreted as follows:  Greater than 1000 cells/mm3 gives normal protection against infection.  500 to 1000 cells/mm3 gives an increased risk for  infection.  200 to 500 cells/mm3 is a greater risk for severe infection.  Lower than 200 cells/mm3 is a marked risk of infection. This may require hospitalization and treatment with antibiotic medicines. TREATMENT  Treatment depends on the underlying cause, severity, and presence of infections or symptoms. It also depends on your health. Your caregiver will discuss the treatment plan with you. Mild cases are often easily treated and have a good outcome. Preventative measures may also be started to limit your risk of infections. Treatment can include:  Taking antibiotics.  Stopping medicines that are known to cause neutropenia.  Correcting nutritional deficiencies by eating green vegetables to supply folic acid and taking vitamin B supplements.  Stopping exposure to pesticides if your neutropenia is related to pesticide exposure.  Taking a blood growth factor called sargramostim, pegfilgrastim, or filgrastim if you are undergoing chemotherapy for cancer. This stimulates white blood cell production.  Removal of the spleen if you have Felty's syndrome and have repeated infections. HOME CARE INSTRUCTIONS   Follow your caregiver's instructions about when you need to have blood work done.  Wash your hands often. Make sure others who come in contact with you also wash their hands.  Wash raw fruits and vegetables before eating them. They can carry bacteria and fungi.  Avoid people with colds or spreadable (contagious) diseases (chickenpox, herpes zoster, influenza).  Avoid large crowds.  Avoid construction areas. The dust can release fungus into the air.  Be cautious around children in daycare or school environments.  Take care of your respiratory system by coughing and deep breathing.  Bathe daily.  Protect your skin from cuts and   burns.  Do not work in the garden or with flowers and plants.  Care for the mouth before and after meals by brushing with a soft toothbrush. If you have  mucositis, do not use mouthwash. Mouthwash contains alcohol and can dry out the mouth even more.  Clean the area between the genitals and the anus (perineal area) after urination and bowel movements. Women need to wipe from front to back.  Use a water soluble lubricant during sexual intercourse and practice good hygiene after. Do not have intercourse if you are severely neutropenic. Check with your caregiver for guidelines.  Exercise daily as tolerated.  Avoid people who were vaccinated with a live vaccine in the past 30 days. You should not receive live vaccines (polio, typhoid).  Do not provide direct care for pets. Avoid animal droppings. Do not clean litter boxes and bird cages.  Do not share food utensils.  Do not use tampons, enemas, or rectal suppositories unless directed by your caregiver.  Use an electric razor to remove hair.  Wash your hands after handling magazines, letters, and newspapers. SEEK IMMEDIATE MEDICAL CARE IF:   You have a fever.  You have chills or start to shake.  You feel nauseous or vomit.  You develop mouth sores.  You develop aches and pains.  You have redness and swelling around open wounds.  Your skin is warm to the touch.  You have pus coming from your wounds.  You develop swollen lymph nodes.  You feel weak or fatigued.  You develop red streaks on the skin. MAKE SURE YOU:  Understand these instructions.  Will watch your condition.  Will get help right away if you are not doing well or get worse.   This information is not intended to replace advice given to you by your health care provider. Make sure you discuss any questions you have with your health care provider.   Document Released: 10/14/2001 Document Revised: 07/17/2011 Document Reviewed: 11/04/2014 Elsevier Interactive Patient Education [employer].

## 2015-03-29 NOTE — Progress Notes (Signed)
Hold treatment today due to neutropenia.Patient verbalized understanding regarding neutropenia precautions.

## 2015-03-29 NOTE — Progress Notes (Signed)
Nutrition follow-up could not be completed secondary to chemotherapy being held secondary to low counts. Weight did improve and was documented as 132.2 pounds on November 18, which is increased from 129.9 pounds November 4. Noted patient having some difficulty swallowing.  I will follow-up with patient and monitor for nutrition impact symptoms. Next visit is Monday, November 28, during infusion.

## 2015-03-30 ENCOUNTER — Encounter (HOSPITAL_COMMUNITY): Payer: Self-pay | Admitting: Emergency Medicine

## 2015-03-30 ENCOUNTER — Emergency Department (HOSPITAL_COMMUNITY): Payer: Medicare Other

## 2015-03-30 ENCOUNTER — Other Ambulatory Visit: Payer: Medicare Other

## 2015-03-30 ENCOUNTER — Other Ambulatory Visit: Payer: Self-pay

## 2015-03-30 ENCOUNTER — Emergency Department (HOSPITAL_COMMUNITY)
Admission: EM | Admit: 2015-03-30 | Discharge: 2015-03-30 | Disposition: A | Discharge status: Home / Self Care | Payer: Medicare Other | Attending: Emergency Medicine | Admitting: Emergency Medicine

## 2015-03-30 ENCOUNTER — Ambulatory Visit: Payer: Medicare Other | Admitting: Physician Assistant

## 2015-03-30 ENCOUNTER — Encounter: Payer: Self-pay | Admitting: Radiation Oncology

## 2015-03-30 ENCOUNTER — Ambulatory Visit
Admission: RE | Admit: 2015-03-30 | Discharge: 2015-03-30 | Disposition: A | Discharge status: Home / Self Care | Payer: Medicare Other | Source: Ambulatory Visit | Attending: Radiation Oncology | Admitting: Radiation Oncology

## 2015-03-30 VITALS — BP 108/60 | HR 103 | Temp 98.9°F | Resp 20 | Wt 130.3 lb

## 2015-03-30 DIAGNOSIS — Z85118 Personal history of other malignant neoplasm of bronchus and lung: Secondary | ICD-10-CM | POA: Insufficient documentation

## 2015-03-30 DIAGNOSIS — Z87438 Personal history of other diseases of male genital organs: Secondary | ICD-10-CM | POA: Insufficient documentation

## 2015-03-30 DIAGNOSIS — Z79899 Other long term (current) drug therapy: Secondary | ICD-10-CM | POA: Insufficient documentation

## 2015-03-30 DIAGNOSIS — R000 Tachycardia, unspecified: Secondary | ICD-10-CM | POA: Insufficient documentation

## 2015-03-30 DIAGNOSIS — Z8709 Personal history of other diseases of the respiratory system: Secondary | ICD-10-CM | POA: Diagnosis not present

## 2015-03-30 DIAGNOSIS — Z7902 Long term (current) use of antithrombotics/antiplatelets: Secondary | ICD-10-CM | POA: Insufficient documentation

## 2015-03-30 DIAGNOSIS — D849 Immunodeficiency, unspecified: Secondary | ICD-10-CM | POA: Insufficient documentation

## 2015-03-30 DIAGNOSIS — K209 Esophagitis, unspecified without bleeding: Secondary | ICD-10-CM

## 2015-03-30 DIAGNOSIS — Z8659 Personal history of other mental and behavioral disorders: Secondary | ICD-10-CM | POA: Insufficient documentation

## 2015-03-30 DIAGNOSIS — Z87891 Personal history of nicotine dependence: Secondary | ICD-10-CM | POA: Diagnosis not present

## 2015-03-30 DIAGNOSIS — Z510 Encounter for antineoplastic radiation therapy: Secondary | ICD-10-CM | POA: Diagnosis not present

## 2015-03-30 DIAGNOSIS — Z923 Personal history of irradiation: Secondary | ICD-10-CM | POA: Diagnosis not present

## 2015-03-30 DIAGNOSIS — E785 Hyperlipidemia, unspecified: Secondary | ICD-10-CM | POA: Insufficient documentation

## 2015-03-30 DIAGNOSIS — R1310 Dysphagia, unspecified: Secondary | ICD-10-CM | POA: Diagnosis present

## 2015-03-30 DIAGNOSIS — C3411 Malignant neoplasm of upper lobe, right bronchus or lung: Secondary | ICD-10-CM

## 2015-03-30 MED ORDER — GI COCKTAIL ~~LOC~~
30.0000 mL | Freq: Once | ORAL | Status: AC
  Administered 2015-03-30: 30 mL via ORAL
  Filled 2015-03-30: qty 30

## 2015-03-30 MED ORDER — FLUCONAZOLE 200 MG PO TABS: 200.0000 mg | ORAL_TABLET | Freq: Every day | ORAL | Status: DC

## 2015-03-30 MED ORDER — FLUCONAZOLE 200 MG PO TABS
400.0000 mg | ORAL_TABLET | Freq: Every day | ORAL | Status: DC
  Administered 2015-03-30: 400 mg via ORAL
  Filled 2015-03-30: qty 2

## 2015-03-30 MED ORDER — SUCRALFATE 1 G PO TABS: 1.0000 g | ORAL_TABLET | Freq: Four times a day (QID) | ORAL | Status: DC

## 2015-03-30 NOTE — Progress Notes (Signed)
Department of Radiation Oncology  Phone:  [phone] Fax:        [phone]  Weekly Treatment Note    Name: Ivan Daniels Date: 03/30/2015 MRN: [id number] DOB: Mar 01, 1946   Current dose: 46 Gy  Current fraction: 23   MEDICATIONS: Current Outpatient Prescriptions  Medication Sig Dispense Refill  . benztropine (COGENTIN) 0.5 MG tablet Take 0.5 mg by mouth at bedtime.     . clopidogrel (PLAVIX) 75 MG tablet Take 75 mg by mouth daily.     . ferrous fumarate (HEMOCYTE - 106 MG FE) 325 (106 FE) MG TABS tablet Take 1 tablet by mouth daily.     . fluconazole (DIFLUCAN) 200 MG tablet Take 1 tablet (200 mg total) by mouth daily. 13 tablet 0  . haloperidol (HALDOL) 5 MG tablet Take 5 mg by mouth at bedtime.     . Multiple Vitamins-Minerals (MULTIVITAMIN WITH MINERALS) tablet Take 1 tablet by mouth daily.    . simvastatin (ZOCOR) 20 MG tablet Take 20 mg by mouth at bedtime.     . Wound Dressings (SONAFINE) Apply 1 application topically 3 (three) times daily.    Marland Kitchen lidocaine-prilocaine (EMLA) cream Apply 1 application topically as needed. (Patient not taking: Reported on 03/30/2015) 30 g 0  . oxyCODONE (OXY IR/ROXICODONE) 5 MG immediate release tablet Take 1 tablet (5 mg total) by mouth every 3 (three) hours as needed for moderate pain. (Patient not taking: Reported on 03/05/2015) 30 tablet 0  . oxyCODONE 10 MG TABS Take 1 tablet (10 mg total) by mouth every 3 (three) hours as needed for severe pain. (Patient not taking: Reported on 03/05/2015) 30 tablet 0  . prochlorperazine (COMPAZINE) 10 MG tablet Take 1 tablet (10 mg total) by mouth every 6 (six) hours as needed for nausea or vomiting. (Patient not taking: Reported on 03/05/2015) 30 tablet 0   No current facility-administered medications for this encounter.     ALLERGIES: Benztropine mesylate and Chlorpromazine hcl   LABORATORY DATA:  Lab Results  Component Value Date   WBC 1.6* 03/29/2015   HGB 8.6* 03/29/2015   HCT  26.4* 03/29/2015   MCV 89.8 03/29/2015   PLT 198 03/29/2015   Lab Results  Component Value Date   NA 138 03/29/2015   K 3.9 03/29/2015   CL 101 01/26/2015   CO2 23 03/29/2015   Lab Results  Component Value Date   ALT 13 03/29/2015   AST 20 03/29/2015   ALKPHOS 54 03/29/2015   BILITOT <0.30 03/29/2015     NARRATIVE: Ivan Daniels was seen today for weekly treatment management. The chart was checked and the patient's films were reviewed.  Weekly rad txs 23/30 right lung, was in the ED this am, MD spoke with Resident MD in hospital, patient was d/c home, with Rx diflucan, which he will pick up after seeing Dr. Lisbeth Renshaw today, has dry cough wearing mask, no production, still has some difficulty swallowing BP 108/60 mmHg  Pulse 103  Temp(Src) 98.9 F (37.2 C) (Oral)  Resp 20  Wt 130 lb 4.8 oz (59.104 kg)  Wt Readings from Last 3 Encounters:  03/30/15 130 lb 4.8 oz (59.104 kg)  03/26/15 132 lb 3.2 oz (59.966 kg)  03/22/15 128 lb 6.4 oz (58.242 kg)   3:23 PM   PHYSICAL EXAMINATION: weight is 130 lb 4.8 oz (59.104 kg). His oral temperature is 98.9 F (37.2 C). His blood pressure is 108/60 and his pulse is 103. His respiration is 20.  ASSESSMENT: The patient is doing satisfactorily with treatment. The patient has developed some esophagitis. He presented unfortunately to the emergency room regarding this. He was given Diflucan which I believe is reasonable. I have also given him a prescription for Carafate.  PLAN: We will continue with the patient's radiation treatment as planned.

## 2015-03-30 NOTE — Discharge Instructions (Signed)
Esophagitis °Esophagitis is inflammation of the esophagus. The esophagus is the tube that carries food and liquids from your mouth to your stomach. Esophagitis can cause soreness or pain in the esophagus. This condition can make it difficult and painful to swallow.  °CAUSES °Most causes of esophagitis are not serious. Common causes of this condition include: °· Gastroesophageal reflux disease (GERD). This is when stomach contents move back up into the esophagus (reflux). °· Repeated vomiting. °· An allergic-type reaction, especially caused by food allergies (eosinophilic esophagitis). °· Injury to the esophagus by swallowing large pills with or without water, or swallowing certain types of medicines. °· Swallowing (ingesting) harmful chemicals, such as household cleaning products. °· Heavy alcohol use. °· An infection of the esophagus. This most often occurs in people who have a weakened immune system. °· Radiation or chemotherapy treatment for cancer. °· Certain diseases such as sarcoidosis, Crohn disease, and scleroderma. °SYMPTOMS °Symptoms of this condition include: °· Difficult or painful swallowing. °· Pain with swallowing acidic liquids, such as citrus juices. °· Pain with burping. °· Chest pain. °· Difficulty breathing. °· Nausea. °· Vomiting. °· Pain in the abdomen. °· Weight loss. °· Ulcers in the mouth. °· Patches of white material in the mouth (candidiasis). °· Fever. °· Coughing up blood or vomiting blood. °· Stool that is black, tarry, or bright red. °DIAGNOSIS °Your health care provider will take a medical history and perform a physical exam. You may also have other tests, including: °· An endoscopy to examine your stomach and esophagus with a small camera. °· A test that measures the acidity level in your esophagus. °· A test that measures how much pressure is on your esophagus. °· A barium swallow or modified barium swallow to show the shape, size, and functioning of your esophagus. °· Allergy  tests. °TREATMENT °Treatment for this condition depends on the cause of your esophagitis. In some cases, steroids or other medicines may be given to help relieve your symptoms or to treat the underlying cause of your condition. You may have to make some lifestyle changes, such as: °· Avoiding alcohol. °· Quitting smoking. °· Changing your diet. °· Exercising. °· Changing your sleep habits and your sleep environment. °HOME CARE INSTRUCTIONS °Take these actions to decrease your discomfort and to help avoid complications. °Diet °· Follow a diet as recommended by your health care provider. This may involve avoiding foods and drinks such as: °¨ Coffee and tea (with or without caffeine). °¨ Drinks that contain alcohol. °¨ Energy drinks and sports drinks. °¨ Carbonated drinks or sodas. °¨ Chocolate and cocoa. °¨ Peppermint and mint flavorings. °¨ Garlic and onions. °¨ Horseradish. °¨ Spicy and acidic foods, including peppers, chili powder, curry powder, vinegar, hot sauces, and barbecue sauce. °¨ Citrus fruit juices and citrus fruits, such as oranges, lemons, and limes. °¨ Tomato-based foods, such as red sauce, chili, salsa, and pizza with red sauce. °¨ Fried and fatty foods, such as donuts, french fries, potato chips, and high-fat dressings. °¨ High-fat meats, such as hot dogs and fatty cuts of red and white meats, such as rib eye steak, sausage, ham, and bacon. °¨ High-fat dairy items, such as whole milk, butter, and cream cheese. °· Eat small, frequent meals instead of large meals. °· Avoid drinking large amounts of liquid with your meals. °· Avoid eating meals during the 2-3 hours before bedtime. °· Avoid lying down right after you eat. °· Do not exercise right after you eat. °· Avoid foods and drinks that seem to   make your symptoms worse. °General Instructions °· Pay attention to any changes in your symptoms. °· Take over-the-counter and prescription medicines only as told by your health care provider. Do not take  aspirin, ibuprofen, or other NSAIDs unless your health care provider told you to do so. °· If you have trouble taking pills, use a pill splitter to decrease the size of the pill. This will decrease the chance of the pill getting stuck or injuring your esophagus on the way down. Also, drink water after you take a pill. °· Do not use any tobacco products, including cigarettes, chewing tobacco, and e-cigarettes. If you need help quitting, ask your health care provider. °· Wear loose-fitting clothing. Do not wear anything tight around your waist that causes pressure on your abdomen. °· Raise (elevate) the head of your bed about 6 inches (15 cm). °· Try to reduce your stress, such as with yoga or meditation. If you need help reducing stress, ask your health care provider. °· If you are overweight, reduce your weight to an amount that is healthy for you. Ask your health care provider for guidance about a safe weight loss goal. °· Keep all follow-up visits as told by your health care provider. This is important. °SEEK MEDICAL CARE IF: °· You have new symptoms. °· You have unexplained weight loss. °· You have difficulty swallowing, or it hurts to swallow. °· You have wheezing or a persistent cough. °· Your symptoms do not improve with treatment. °· You have frequent heartburn for more than two weeks. °SEEK IMMEDIATE MEDICAL CARE IF: °· You have severe pain in your arms, neck, jaw, teeth, or back. °· You feel sweaty, dizzy, or light-headed. °· You have chest pain or shortness of breath. °· You vomit and your vomit looks like blood or coffee grounds. °· Your stool is bloody or black. °· You have a fever. °· You cannot swallow, drink, or eat. °  °This information is not intended to replace advice given to you by your health care provider. Make sure you discuss any questions you have with your health care provider. °  °Document Released: 06/01/2004 Document Revised: 01/13/2015 Document Reviewed: 08/19/2014 °Elsevier Interactive  Patient Education ©2016 [employer] ° °

## 2015-03-30 NOTE — ED Notes (Signed)
Patient transported to X-ray 

## 2015-03-30 NOTE — ED Notes (Signed)
Per pt, states painful to swallow which goes down into his chest-states chest congestion as well

## 2015-03-30 NOTE — Progress Notes (Addendum)
Weekly rad txs 23/30 right lung, was in the ED this am, MD spoke with Resident MD in hospital, patient was d/c home, with Rx diflucan, which he will pick up after seeing Dr. Lisbeth Renshaw today, has dry cough wearing mask, no production, still has some difficulty swallowing BP 108/60 mmHg  Pulse 103  Temp(Src) 98.9 F (37.2 C) (Oral)  Resp 20  Wt 130 lb 4.8 oz (59.104 kg)  Wt Readings from Last 3 Encounters:  03/30/15 130 lb 4.8 oz (59.104 kg)  03/26/15 132 lb 3.2 oz (59.966 kg)  03/22/15 128 lb 6.4 oz (58.242 kg)   2:54 PM

## 2015-03-30 NOTE — ED Provider Notes (Signed)
CSN: [id number]     Arrival date & time 03/30/15  8250 History   First MD Initiated Contact with Patient 03/30/15 [phone]     Chief Complaint  Patient presents with  . Dysphagia     (Consider location/radiation/quality/duration/timing/severity/associated sxs/prior Treatment) HPI  Patient is a 69 year old male with history of lung cancer on chemotherapy and currently receiving radiation therapy presenting with cough and substernal chest pain with swallowing for the past several days. Pain is described as burning. No radiation. Pain occurs with swallowing of fluids and solids, but patient does not have pain otherwise. Patient has not tried anything for the pain. No regurgitation or vomiting. No diarrhea. Cough has been nonproductive. No hemoptysis. No fevers or chills. No shortness of breath.   Past Medical History  Diagnosis Date  . Hyperlipidemia   . TOBACCO ABUSE 05/24/2009    Qualifier: Diagnosis of  By: Hassell Done FNP, Tori Milks    . Paranoid schizophrenia ([place]) 10/31/2009    Qualifier: Diagnosis of  By: Jorene Minors, [place]    . BENIGN PROSTATIC HYPERTROPHY, HX OF 12/19/2006    Qualifier: Diagnosis of  By: Radene Ou MD, [place]    . Primary spontaneous pneumothorax, left 01/25/2015  . Lung mass 01/25/2015    3.7 x 3.6 cm RUL spiculated lung mass with 2.0 cm right paratracheal lymph node suspicious for bronchogenic CA  . Full code status 02/10/2015  . Allergy   . ALCOHOL ABUSE 11/12/2009    Qualifier: Diagnosis of  By: Hassell Done FNP, Tori Milks    . 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 Video bronchoscopy with endobronchial ultrasound N/A 01/27/2015    Procedure: VIDEO BRONCHOSCOPY WITH ENDOBRONCHIAL ULTRASOUND;  Surgeon: Grace Isaac, MD;  Location: [facility] OR;  Service: Thoracic;  Laterality: N/A;  . Lung biopsy Right 01/27/2015    Procedure: Right Upper Lobe Bronchus BIOPSY;  Surgeon: Grace Isaac,  MD;  Location: [facility];  Service: Thoracic;  Laterality: Right;   Family History  Problem Relation Age of Onset  . Cancer Sister     breast   Social History  Substance Use Topics  . Smoking status: Former Smoker -- 0.50 packs/day for 52 years    Types: Cigarettes  . Smokeless tobacco: Former Systems developer    Quit date: 03/01/2015  . Alcohol Use: No    Review of Systems  Constitutional: Negative for fever and chills.  HENT: Negative.   Eyes: Negative.   Respiratory: Positive for cough. Negative for shortness of breath.   Cardiovascular: Positive for chest pain.  Gastrointestinal: Negative for nausea, vomiting and diarrhea.  Endocrine: Negative.   Genitourinary: Negative.   Musculoskeletal: Negative.   Skin: Negative.   Allergic/Immunologic: Positive for immunocompromised state.  Neurological: Negative.   Hematological: Negative.   Psychiatric/Behavioral: Negative.     Allergies  Benztropine mesylate and Chlorpromazine hcl  Home Medications   Prior to Admission medications   Medication Sig Start Date End Date Taking? Authorizing Provider  benztropine (COGENTIN) 0.5 MG tablet Take 0.5 mg by mouth at bedtime.  02/24/13  Yes Historical Provider, MD  clopidogrel (PLAVIX) 75 MG tablet Take 75 mg by mouth daily.  12/09/12  Yes Historical Provider, MD  ferrous fumarate (HEMOCYTE - 106 MG FE) 325 (106 FE) MG TABS tablet Take 1 tablet by mouth daily.    Yes Historical Provider, MD  haloperidol (HALDOL) 5 MG tablet Take 5 mg by  mouth at bedtime.  02/21/13  Yes Historical Provider, MD  lidocaine-prilocaine (EMLA) cream Apply 1 application topically as needed. Patient taking differently: Apply 1 application topically as needed (port).  03/08/15  Yes Curt Bears, MD  Multiple Vitamins-Minerals (MULTIVITAMIN WITH MINERALS) tablet Take 1 tablet by mouth daily.   Yes Historical Provider, MD  simvastatin (ZOCOR) 20 MG tablet Take 20 mg by mouth at bedtime.  02/14/13  Yes Historical Provider, MD   Wound Dressings (SONAFINE) Apply 1 application topically 3 (three) times daily.   Yes Historical Provider, MD  fluconazole (DIFLUCAN) 200 MG tablet Take 1 tablet (200 mg total) by mouth daily. 03/30/15   Vivi Barrack, MD  oxyCODONE (OXY IR/ROXICODONE) 5 MG immediate release tablet Take 1 tablet (5 mg total) by mouth every 3 (three) hours as needed for moderate pain. Patient not taking: Reported on 03/05/2015 01/29/15   [name] R [name], PA-C  oxyCODONE 10 MG TABS Take 1 tablet (10 mg total) by mouth every 3 (three) hours as needed for severe pain. Patient not taking: Reported on 03/05/2015 01/29/15   [name] R [name], PA-C  prochlorperazine (COMPAZINE) 10 MG tablet Take 1 tablet (10 mg total) by mouth every 6 (six) hours as needed for nausea or vomiting. Patient not taking: Reported on 03/05/2015 02/10/15   Curt Bears, MD   BP 125/76 mmHg  Pulse 107  Temp(Src) 98 F (36.7 C) (Oral)  Resp 17  SpO2 100% Physical Exam  Constitutional: He is oriented to person, place, and time. He appears ill.  HENT:  Head: Normocephalic and atraumatic.  Mouth/Throat: Oropharynx is clear and moist and mucous membranes are normal.  Neck: Normal range of motion. Neck supple.  Cardiovascular: Regular rhythm.  Tachycardia present.   Pulmonary/Chest: Effort normal and breath sounds normal. He has no wheezes. He has no rales. He exhibits no tenderness.  Abdominal: Soft. Bowel sounds are normal. He exhibits no distension. There is no tenderness.  Musculoskeletal: Normal range of motion. He exhibits no edema.  Neurological: He is alert and oriented to person, place, and time.  Skin: Skin is warm and dry.  Psychiatric: He has a normal mood and affect. His behavior is normal.  Nursing note and vitals reviewed.   ED Course  Procedures (including critical care time) Labs Review Labs Reviewed - No data to display  Imaging Review Dg Chest 2 View  03/30/2015  CLINICAL DATA:  New difficulty swallowing. Chest  pain. History of right lung cancer. EXAM: CHEST  2 VIEW COMPARISON:  02/03/2015 FINDINGS: Right suprahilar mass with mild right upper lobe volume loss. Emphysematous changes at the apices. There is no edema, consolidation, effusion, or pneumothorax. Normal heart size and aortic contours. Left nipple shadow noted.  Clear left lung. Right-sided porta catheter, tip at the SVC level. IMPRESSION: 1. No acute finding or change from September 2016. 2. Right upper lobe cancer with chronic postobstructive volume loss. Electronically Signed   By: Monte Fantasia M.D.   On: 03/30/2015 08:06   I have personally reviewed and evaluated these images and lab results as part of my medical decision-making.   EKG Interpretation None      MDM   Final diagnoses:  Esophagitis   Patient is a 69 year old male with history of lung cancer on chemotherapy and currently receiving radiation therapy presenting with retrosternal chest pain on swallowing. Patient does not have pain when not swallowing Physical exam unremarkable. CXR with no acute findings. Pain on swallowing likely due to esophagitis secondary  to side effects of radiation and chemotherapy. Additionally, patient is immunocompromised with an [place] of 900 increasing his risk for developing candida esophagitis. Discussed the patient's case with his radiation oncologist Dr Lisbeth Renshaw. Patient will be following up with Dr Lisbeth Renshaw later today to discuss ways to manage the pain. Will empirically treat with diflucan for possible candida esophagitis for 14 day course.   Vivi Barrack, MD 03/30/15 [place], MD 03/30/15 [place], MD 03/30/15 1057

## 2015-03-30 NOTE — ED Notes (Signed)
Bed: WA06 Expected date:  Expected time:  Means of arrival:  Comments: 

## 2015-03-31 ENCOUNTER — Ambulatory Visit
Admission: RE | Admit: 2015-03-31 | Discharge: 2015-03-31 | Disposition: A | Discharge status: Home / Self Care | Payer: Medicare Other | Source: Ambulatory Visit | Attending: Radiation Oncology | Admitting: Radiation Oncology

## 2015-03-31 ENCOUNTER — Encounter: Payer: Self-pay | Admitting: *Deleted

## 2015-03-31 DIAGNOSIS — Z510 Encounter for antineoplastic radiation therapy: Secondary | ICD-10-CM | POA: Diagnosis not present

## 2015-03-31 NOTE — Progress Notes (Signed)
[place] Work  Clinical Social Work was referred by Development worker, international aid for assessment of psychosocial needs.  Clinical Social Worker contacted patient's spouse by phone to offer support and assess for needs.  Ms. Toney Rakes, patient's sister, shared she feels they are handling this cancer process fairly well.  She has no concerns at this time.  CSW briefly reviewed CSW role and support services available for patient/family.  Patient's sister was appreciative of call and agreed to call CSW with any questions or concerns.    Polo Riley, MSW, LCSW, OSW-C Clinical Social Worker [facility] [phone]

## 2015-04-02 ENCOUNTER — Ambulatory Visit: Payer: Medicare Other

## 2015-04-05 ENCOUNTER — Ambulatory Visit
Admission: RE | Admit: 2015-04-05 | Discharge: 2015-04-05 | Disposition: A | Discharge status: Home / Self Care | Payer: Medicare Other | Source: Ambulatory Visit | Attending: Radiation Oncology | Admitting: Radiation Oncology

## 2015-04-05 ENCOUNTER — Other Ambulatory Visit: Payer: Medicare Other

## 2015-04-05 ENCOUNTER — Ambulatory Visit: Payer: Medicare Other | Admitting: Nutrition

## 2015-04-05 ENCOUNTER — Encounter: Payer: Self-pay | Admitting: *Deleted

## 2015-04-05 ENCOUNTER — Ambulatory Visit (HOSPITAL_BASED_OUTPATIENT_CLINIC_OR_DEPARTMENT_OTHER): Payer: Medicare Other

## 2015-04-05 ENCOUNTER — Encounter: Payer: Self-pay | Admitting: Internal Medicine

## 2015-04-05 ENCOUNTER — Ambulatory Visit (HOSPITAL_BASED_OUTPATIENT_CLINIC_OR_DEPARTMENT_OTHER): Payer: Medicare Other | Admitting: Internal Medicine

## 2015-04-05 ENCOUNTER — Other Ambulatory Visit (HOSPITAL_BASED_OUTPATIENT_CLINIC_OR_DEPARTMENT_OTHER): Payer: Medicare Other

## 2015-04-05 VITALS — BP 111/63 | HR 116 | Temp 98.6°F | Resp 18 | Ht 71.0 in | Wt 128.0 lb

## 2015-04-05 DIAGNOSIS — C3411 Malignant neoplasm of upper lobe, right bronchus or lung: Secondary | ICD-10-CM

## 2015-04-05 DIAGNOSIS — Z510 Encounter for antineoplastic radiation therapy: Secondary | ICD-10-CM | POA: Diagnosis not present

## 2015-04-05 DIAGNOSIS — Z5111 Encounter for antineoplastic chemotherapy: Secondary | ICD-10-CM | POA: Diagnosis present

## 2015-04-05 DIAGNOSIS — K208 Other esophagitis without bleeding: Secondary | ICD-10-CM

## 2015-04-05 DIAGNOSIS — C3491 Malignant neoplasm of unspecified part of right bronchus or lung: Secondary | ICD-10-CM

## 2015-04-05 DIAGNOSIS — C3490 Malignant neoplasm of unspecified part of unspecified bronchus or lung: Secondary | ICD-10-CM

## 2015-04-05 LAB — CBC WITH DIFFERENTIAL/PLATELET
BASO%: 1.1 % (ref 0.0–2.0)
Basophils Absolute: 0 10*3/uL (ref 0.0–0.1)
EOS%: 0.7 % (ref 0.0–7.0)
Eosinophils Absolute: 0 10*3/uL (ref 0.0–0.5)
HCT: 30.6 % — ABNORMAL LOW (ref 38.4–49.9)
HGB: 9.9 g/dL — ABNORMAL LOW (ref 13.0–17.1)
LYMPH%: 32.5 % (ref 14.0–49.0)
MCH: 29.2 pg (ref 27.2–33.4)
MCHC: 32.4 g/dL (ref 32.0–36.0)
MCV: 90.3 fL (ref 79.3–98.0)
MONO#: 0.5 10*3/uL (ref 0.1–0.9)
MONO%: 18.6 % — ABNORMAL HIGH (ref 0.0–14.0)
NEUT#: 1.3 10*3/uL — ABNORMAL LOW (ref 1.5–6.5)
NEUT%: 47.1 % (ref 39.0–75.0)
Platelets: 193 10*3/uL (ref 140–400)
RBC: 3.39 10*6/uL — ABNORMAL LOW (ref 4.20–5.82)
RDW: 17.3 % — ABNORMAL HIGH (ref 11.0–14.6)
WBC: 2.8 10*3/uL — ABNORMAL LOW (ref 4.0–10.3)
lymph#: 0.9 10*3/uL (ref 0.9–3.3)

## 2015-04-05 LAB — COMPREHENSIVE METABOLIC PANEL (CC13)
ALT: <9 U/L (ref 0–55)
AST: 14 U/L (ref 5–34)
Albumin: 3.4 g/dL — ABNORMAL LOW (ref 3.5–5.0)
Alkaline Phosphatase: 53 U/L (ref 40–150)
Anion Gap: 9 mEq/L (ref 3–11)
BUN: 9.8 mg/dL (ref 7.0–26.0)
CO2: 28 mEq/L (ref 22–29)
Calcium: 9.5 mg/dL (ref 8.4–10.4)
Chloride: 104 mEq/L (ref 98–109)
Creatinine: 0.9 mg/dL (ref 0.7–1.3)
EGFR: >90 mL/min/{1.73_m2} (ref >90)
Glucose: 80 mg/dl (ref 70–140)
Potassium: 4 mEq/L (ref 3.5–5.1)
Sodium: 140 mEq/L (ref 136–145)
Total Bilirubin: <0.3 mg/dL (ref 0.20–1.20)
Total Protein: 7.4 g/dL (ref 6.4–8.3)

## 2015-04-05 MED ORDER — HEPARIN SOD (PORK) LOCK FLUSH 100 UNIT/ML IV SOLN
500.0000 [IU] | Freq: Once | INTRAVENOUS | Status: AC | PRN
  Administered 2015-04-05: 500 [IU]
  Filled 2015-04-05: qty 5

## 2015-04-05 MED ORDER — SONAFINE EX EMUL
1.0000 "application " | Freq: Once | CUTANEOUS | Status: AC
  Administered 2015-04-05: 1 via TOPICAL
  Filled 2015-04-05: qty 45

## 2015-04-05 MED ORDER — SODIUM CHLORIDE 0.9 % IJ SOLN
10.0000 mL | INTRAMUSCULAR | Status: DC | PRN
  Administered 2015-04-05: 10 mL
  Filled 2015-04-05: qty 10

## 2015-04-05 MED ORDER — SODIUM CHLORIDE 0.9 % IV SOLN
Freq: Once | INTRAVENOUS | Status: AC
  Administered 2015-04-05: 13:00:00 via INTRAVENOUS

## 2015-04-05 MED ORDER — FAMOTIDINE IN NACL 20-0.9 MG/50ML-% IV SOLN
20.0000 mg | Freq: Once | INTRAVENOUS | Status: AC
  Administered 2015-04-05: 20 mg via INTRAVENOUS

## 2015-04-05 MED ORDER — FAMOTIDINE IN NACL 20-0.9 MG/50ML-% IV SOLN
INTRAVENOUS | Status: AC
  Filled 2015-04-05: qty 50

## 2015-04-05 MED ORDER — SODIUM CHLORIDE 0.9 % IV SOLN
168.6000 mg | Freq: Once | INTRAVENOUS | Status: AC
  Administered 2015-04-05: 170 mg via INTRAVENOUS
  Filled 2015-04-05: qty 17

## 2015-04-05 MED ORDER — SODIUM CHLORIDE 0.9 % IV SOLN
Freq: Once | INTRAVENOUS | Status: AC
  Administered 2015-04-05: 14:00:00 via INTRAVENOUS
  Filled 2015-04-05: qty 8

## 2015-04-05 MED ORDER — DIPHENHYDRAMINE HCL 50 MG/ML IJ SOLN
50.0000 mg | Freq: Once | INTRAMUSCULAR | Status: AC
  Administered 2015-04-05: 50 mg via INTRAVENOUS

## 2015-04-05 MED ORDER — PACLITAXEL CHEMO INJECTION 300 MG/50ML
45.0000 mg/m2 | Freq: Once | INTRAVENOUS | Status: AC
  Administered 2015-04-05: 78 mg via INTRAVENOUS
  Filled 2015-04-05: qty 13

## 2015-04-05 MED ORDER — DIPHENHYDRAMINE HCL 50 MG/ML IJ SOLN
INTRAMUSCULAR | Status: AC
  Filled 2015-04-05: qty 1

## 2015-04-05 NOTE — Progress Notes (Addendum)
Nutrition follow-up completed with patient and wife, during infusion for lung cancer. Weight decreased and documented as 128 pounds on November 28 decreased from 132.2 pounds November 18. Patient noted to have esophagitis. Patient states medications are helping and he is not having difficulty swallowing. Patient states he rested a lot over the holiday. He denies nutrition impact symptoms.  Nutrition diagnosis:  Food and nutrition related knowledge deficit improved.  Intervention: Provided supportive listening and encouraged patient to continue increased oral intake to promote weight gain. Provided additional coupons for oral nutrition supplements. Teach back method was used.  Monitoring, evaluation, goals: Patient will tolerate adequate calories and protein to minimize further weight loss.  Next visit: To be scheduled as needed.  Patient has contact information for questions.  **Disclaimer: This note was dictated with voice recognition software. Similar sounding words can inadvertently be transcribed and this note may contain transcription errors which may not have been corrected upon publication of note.**

## 2015-04-05 NOTE — Progress Notes (Signed)
OK to treat with ANC 1.3 per Dr. Julien Nordmann.

## 2015-04-05 NOTE — Progress Notes (Signed)
[place] [phone]   [phone]  OFFICE PROGRESS NOTE  [name], MD [address] [place] [address]  DIAGNOSIS: Stage IIIA (T2a, N2, M0) non-small cell lung cancer consistent with invasive squamous cell carcinoma presented with right upper lobe lung mass in addition to right hilar and mediastinal lymphadenopathy diagnosed in September 2016.  PRIOR THERAPY: None.  CURRENT THERAPY: Course of concurrent chemoradiation with weekly carboplatin for AUC of 2 and paclitaxel 45 MG/M2. First dose 03/01/2015. He is status post 4 cycles.  INTERVAL HISTORY: Davied Nocito Radwan 69 y.o. male returns to the clinic today for follow-up visit accompanied by his sister. The patient has been tolerating his course of concurrent chemoradiation fairly well except for radiation induced esophagitis and dry cough. He was seen recently at the emergency department for evaluation of persistent esophagitis and the patient was started on Diflucan. He is also on Carafate 1 g by mouth 4 times a day. The patient also has Percocet for pain management. He denied having any significant fever or chills. He has no nausea or vomiting. He denied having any significant chest pain, shortness breath, cough or hemoptysis. He is here today to start cycle #5 of his chemotherapy.  MEDICAL HISTORY: Past Medical History  Diagnosis Date  . Hyperlipidemia   . TOBACCO ABUSE 05/24/2009    Qualifier: Diagnosis of  By: Hassell Done FNP, Tori Milks    . Paranoid schizophrenia ([place]) 10/31/2009    Qualifier: Diagnosis of  By: Jorene Minors, [place]    . BENIGN PROSTATIC HYPERTROPHY, HX OF 12/19/2006    Qualifier: Diagnosis of  By: Radene Ou MD, [place]    . Primary spontaneous pneumothorax, left 01/25/2015  . Lung mass 01/25/2015    3.7 x 3.6 cm RUL spiculated lung mass with 2.0 cm right paratracheal lymph node suspicious for bronchogenic CA  . Full code status 02/10/2015  . Allergy   . ALCOHOL  ABUSE 11/12/2009    Qualifier: Diagnosis of  By: Hassell Done FNP, Tori Milks    . Non-small cell lung cancer ([place]) 01/27/15    non small-cell,squamous cell ca RUL    ALLERGIES:  is allergic to benztropine mesylate and chlorpromazine hcl.  MEDICATIONS:  Current Outpatient Prescriptions  Medication Sig Dispense Refill  . benztropine (COGENTIN) 0.5 MG tablet Take 0.5 mg by mouth at bedtime.     . clopidogrel (PLAVIX) 75 MG tablet Take 75 mg by mouth daily.     . ferrous fumarate (HEMOCYTE - 106 MG FE) 325 (106 FE) MG TABS tablet Take 1 tablet by mouth daily.     . fluconazole (DIFLUCAN) 200 MG tablet Take 1 tablet (200 mg total) by mouth daily. 13 tablet 0  . haloperidol (HALDOL) 5 MG tablet Take 5 mg by mouth at bedtime.     . lidocaine-prilocaine (EMLA) cream Apply 1 application topically as needed. 30 g 0  . Multiple Vitamins-Minerals (MULTIVITAMIN WITH MINERALS) tablet Take 1 tablet by mouth daily.    Marland Kitchen oxyCODONE (OXY IR/ROXICODONE) 5 MG immediate release tablet Take 1 tablet (5 mg total) by mouth every 3 (three) hours as needed for moderate pain. 30 tablet 0  . oxyCODONE 10 MG TABS Take 1 tablet (10 mg total) by mouth every 3 (three) hours as needed for severe pain. 30 tablet 0  . prochlorperazine (COMPAZINE) 10 MG tablet Take 1 tablet (10 mg total) by mouth every 6 (six) hours as needed for nausea or vomiting. 30 tablet 0  . simvastatin (  ZOCOR) 20 MG tablet Take 20 mg by mouth at bedtime.     . sucralfate (CARAFATE) 1 G tablet Take 1 tablet (1 g total) by mouth 4 (four) times daily. 120 tablet 2  . Wound Dressings (SONAFINE) Apply 1 application topically 3 (three) times daily.     No current facility-administered medications for this visit.    SURGICAL HISTORY:  Past Surgical History  Procedure Laterality Date  . Video bronchoscopy with endobronchial ultrasound N/A 01/27/2015    Procedure: VIDEO BRONCHOSCOPY WITH ENDOBRONCHIAL ULTRASOUND;  Surgeon: Grace Isaac, MD;  Location: [facility] OR;   Service: Thoracic;  Laterality: N/A;  . Lung biopsy Right 01/27/2015    Procedure: Right Upper Lobe Bronchus BIOPSY;  Surgeon: Grace Isaac, MD;  Location: [place];  Service: Thoracic;  Laterality: Right;    REVIEW OF SYSTEMS:  A comprehensive review of systems was negative except for: Constitutional: positive for fatigue Gastrointestinal: positive for dyspepsia, dysphagia and odynophagia   PHYSICAL EXAMINATION: General appearance: alert, cooperative and no distress Head: Normocephalic, without obvious abnormality, atraumatic Neck: no adenopathy, no JVD, supple, symmetrical, trachea midline and thyroid not enlarged, symmetric, no tenderness/mass/nodules Lymph nodes: Cervical, supraclavicular, and axillary nodes normal. Resp: clear to auscultation bilaterally Back: symmetric, no curvature. ROM normal. No CVA tenderness. Cardio: regular rate and rhythm, S1, S2 normal, no murmur, click, rub or gallop GI: soft, non-tender; bowel sounds normal; no masses,  no organomegaly Extremities: extremities normal, atraumatic, no cyanosis or edema  ECOG PERFORMANCE STATUS: 1 - Symptomatic but completely ambulatory  Blood pressure 111/63, pulse 116, temperature 98.6 F (37 C), temperature source Oral, resp. rate 18, height '5\' 11"'$  (1.803 m), weight 128 lb (58.06 kg), SpO2 100 %.  LABORATORY DATA: Lab Results  Component Value Date   WBC 2.8* 04/05/2015   HGB 9.9* 04/05/2015   HCT 30.6* 04/05/2015   MCV 90.3 04/05/2015   PLT 193 04/05/2015      Chemistry      Component Value Date/Time   NA 140 04/05/2015 1057   NA 138 01/26/2015 1454   K 4.0 04/05/2015 1057   K 3.7 01/26/2015 1454   CL 101 01/26/2015 1454   CO2 28 04/05/2015 1057   CO2 28 01/26/2015 1454   BUN 9.8 04/05/2015 1057   BUN 9 01/26/2015 1454   CREATININE 0.9 04/05/2015 1057   CREATININE 0.95 01/26/2015 1454      Component Value Date/Time   CALCIUM 9.5 04/05/2015 1057   CALCIUM 9.1 01/26/2015 1454   ALKPHOS 53 04/05/2015  1057   ALKPHOS 52 01/26/2015 1454   AST 14 04/05/2015 1057   AST 21 01/26/2015 1454   ALT <9 04/05/2015 1057   ALT 11* 01/26/2015 1454   BILITOT <0.30 04/05/2015 1057   BILITOT <0.1* 01/26/2015 1454       RADIOGRAPHIC STUDIES: Dg Chest 2 View  03/30/2015  CLINICAL DATA:  New difficulty swallowing. Chest pain. History of right lung cancer. EXAM: CHEST  2 VIEW COMPARISON:  02/03/2015 FINDINGS: Right suprahilar mass with mild right upper lobe volume loss. Emphysematous changes at the apices. There is no edema, consolidation, effusion, or pneumothorax. Normal heart size and aortic contours. Left nipple shadow noted.  Clear left lung. Right-sided porta catheter, tip at the SVC level. IMPRESSION: 1. No acute finding or change from September 2016. 2. Right upper lobe cancer with chronic postobstructive volume loss. Electronically Signed   By: Monte Fantasia M.D.   On: 03/30/2015 08:06   Ir Fluoro Guide Cv Line  Right  03/09/2015  CLINICAL DATA:  Metastatic right lung non-small cell carcinoma, needs venous access for chemotherapy EXAM: TUNNELED PORT CATHETER PLACEMENT WITH ULTRASOUND AND FLUOROSCOPIC GUIDANCE FLUOROSCOPY TIME:  0.2 minutes, 15  uGym2 DAP ANESTHESIA/SEDATION: Intravenous Fentanyl and Versed were administered as conscious sedation during continuous cardiorespiratory monitoring by the radiology RN, with a total moderate sedation time of 14 minutes. TECHNIQUE: The procedure, risks, benefits, and alternatives were explained to the patient. Questions regarding the procedure were encouraged and answered. The patient understands and consents to the procedure. As antibiotic prophylaxis, cefazolin 2 g was ordered pre-procedure and administered intravenously within one hour of incision. Patency of the right IJ vein was confirmed with ultrasound with image documentation. An appropriate skin site was determined. Skin site was marked. Region was prepped using maximum barrier technique including cap and  mask, sterile gown, sterile gloves, large sterile sheet, and Chlorhexidine as cutaneous antisepsis. The region was infiltrated locally with 1% lidocaine. Under real-time ultrasound guidance, the right IJ vein was accessed with a 21 gauge micropuncture needle; the needle tip within the vein was confirmed with ultrasound image documentation. Needle was exchanged over a 018 guidewire for transitional dilator which allowed passage of the [facility] wire into the IVC. Over this, the transitional dilator was exchanged for a 5 [place] MPA catheter. A small incision was made on the right anterior chest wall and a subcutaneous pocket fashioned. The power-injectable port was positioned and its catheter tunneled to the right IJ dermatotomy site. The MPA catheter was exchanged over an Amplatz wire for a peel-away sheath, through which the port catheter, which had been trimmed to the appropriate length, was advanced and positioned under fluoroscopy with its tip at the cavoatrial junction. Spot chest radiograph confirms good catheter position and no pneumothorax. The pocket was closed with deep interrupted and subcuticular continuous 3-0 Monocryl sutures. The port was flushed per protocol. The incisions were covered with Dermabond then covered with a sterile dressing. COMPLICATIONS: COMPLICATIONS None immediate IMPRESSION: Technically successful right IJ power-injectable port catheter placement. Ready for routine use. Electronically Signed   By: Lucrezia Europe M.D.   On: 03/09/2015 15:16   Ir US Guide Vasc Access Right  03/09/2015  CLINICAL DATA:  Metastatic right lung non-small cell carcinoma, needs venous access for chemotherapy EXAM: TUNNELED PORT CATHETER PLACEMENT WITH ULTRASOUND AND FLUOROSCOPIC GUIDANCE FLUOROSCOPY TIME:  0.2 minutes, 15  uGym2 DAP ANESTHESIA/SEDATION: Intravenous Fentanyl and Versed were administered as conscious sedation during continuous cardiorespiratory monitoring by the radiology RN, with a total moderate  sedation time of 14 minutes. TECHNIQUE: The procedure, risks, benefits, and alternatives were explained to the patient. Questions regarding the procedure were encouraged and answered. The patient understands and consents to the procedure. As antibiotic prophylaxis, cefazolin 2 g was ordered pre-procedure and administered intravenously within one hour of incision. Patency of the right IJ vein was confirmed with ultrasound with image documentation. An appropriate skin site was determined. Skin site was marked. Region was prepped using maximum barrier technique including cap and mask, sterile gown, sterile gloves, large sterile sheet, and Chlorhexidine as cutaneous antisepsis. The region was infiltrated locally with 1% lidocaine. Under real-time ultrasound guidance, the right IJ vein was accessed with a 21 gauge micropuncture needle; the needle tip within the vein was confirmed with ultrasound image documentation. Needle was exchanged over a 018 guidewire for transitional dilator which allowed passage of the [facility] wire into the IVC. Over this, the transitional dilator was exchanged for a 5 [place] MPA catheter. A  small incision was made on the right anterior chest wall and a subcutaneous pocket fashioned. The power-injectable port was positioned and its catheter tunneled to the right IJ dermatotomy site. The MPA catheter was exchanged over an Amplatz wire for a peel-away sheath, through which the port catheter, which had been trimmed to the appropriate length, was advanced and positioned under fluoroscopy with its tip at the cavoatrial junction. Spot chest radiograph confirms good catheter position and no pneumothorax. The pocket was closed with deep interrupted and subcuticular continuous 3-0 Monocryl sutures. The port was flushed per protocol. The incisions were covered with Dermabond then covered with a sterile dressing. COMPLICATIONS: COMPLICATIONS None immediate IMPRESSION: Technically successful right IJ  power-injectable port catheter placement. Ready for routine use. Electronically Signed   By: Lucrezia Europe M.D.   On: 03/09/2015 15:16    ASSESSMENT AND PLAN: This is a very pleasant 69 years old African-American male with a stage IIIa non-small cell lung cancer and started on concurrent chemoradiation with weekly carboplatin and paclitaxel. Status post 4 week of treatment. He is tolerating the treatment well except for the radiation induced esophagitis. I recommended for the patient to continue his current treatment with Percocet and Carafate. I also recommend for him to discuss with Dr. Lisbeth Renshaw his symptoms. I recommended for the patient to proceed with cycle #5 today as a scheduled. I would see the patient back for follow-up visit in 2 weeks for reevaluation. The patient was advised to call immediately if he has any concerning symptoms in the interval. The patient voices understanding of current disease status and treatment options and is in agreement with the current care plan.  All questions were answered. The patient knows to call the clinic with any problems, questions or concerns. We can certainly see the patient much sooner if necessary.  Disclaimer: This note was dictated with voice recognition software. Similar sounding words can inadvertently be transcribed and may not be corrected upon review.

## 2015-04-05 NOTE — Progress Notes (Signed)
Oncology Nurse Navigator Documentation  Oncology Nurse Navigator Flowsheets 04/05/2015  Navigator Encounter Type Clinic/MDC/spoke with Ivan Daniels and his sister today at clinic.  He has recent issues with esophagitis.  Patient is now on medication to help with esophagitis.   I did ask patient if he is still not smoking.  He stated yes, he is not smoking at this time.  I encourage his efforts to quit.    Patient Visit Type Follow-up  Treatment Phase Treatment  Barriers/Navigation Needs No barriers at this time  Education -  Interventions -  Coordination of Care -  Time Spent with Patient 15

## 2015-04-05 NOTE — Patient Instructions (Signed)
[facility] Discharge Instructions for Patients Receiving Chemotherapy  Today you received the following chemotherapy agents taxol/carboplatin  To help prevent nausea and vomiting after your treatment, we encourage you to take your nausea medication as directed   If you develop nausea and vomiting that is not controlled by your nausea medication, call the clinic.   BELOW ARE SYMPTOMS THAT SHOULD BE REPORTED IMMEDIATELY:  *FEVER GREATER THAN 100.5 F  *CHILLS WITH OR WITHOUT FEVER  NAUSEA AND VOMITING THAT IS NOT CONTROLLED WITH YOUR NAUSEA MEDICATION  *UNUSUAL SHORTNESS OF BREATH  *UNUSUAL BRUISING OR BLEEDING  TENDERNESS IN MOUTH AND THROAT WITH OR WITHOUT PRESENCE OF ULCERS  *URINARY PROBLEMS  *BOWEL PROBLEMS  UNUSUAL RASH Items with * indicate a potential emergency and should be followed up as soon as possible.  Feel free to call the clinic you have any questions or concerns. The clinic phone number is [phone].  

## 2015-04-06 ENCOUNTER — Ambulatory Visit
Admission: RE | Admit: 2015-04-06 | Discharge: 2015-04-06 | Disposition: A | Discharge status: Home / Self Care | Payer: Medicare Other | Source: Ambulatory Visit | Attending: Radiation Oncology | Admitting: Radiation Oncology

## 2015-04-06 DIAGNOSIS — Z510 Encounter for antineoplastic radiation therapy: Secondary | ICD-10-CM | POA: Diagnosis not present

## 2015-04-07 ENCOUNTER — Ambulatory Visit
Admission: RE | Admit: 2015-04-07 | Discharge: 2015-04-07 | Disposition: A | Discharge status: Home / Self Care | Payer: Medicare Other | Source: Ambulatory Visit | Attending: Radiation Oncology | Admitting: Radiation Oncology

## 2015-04-07 ENCOUNTER — Encounter: Payer: Self-pay | Admitting: *Deleted

## 2015-04-07 DIAGNOSIS — Z510 Encounter for antineoplastic radiation therapy: Secondary | ICD-10-CM | POA: Diagnosis not present

## 2015-04-07 NOTE — Progress Notes (Signed)
Oncology Nurse Navigator Documentation  Oncology Nurse Navigator Flowsheets 04/07/2015  Navigator Encounter Type Clinic/MDC/spoke with patient and sister today before his radiation therapy appt.  They are both doing well without any complaints.  No barriers identified at this time.   Patient Visit Type Follow-up  Treatment Phase Treatment  Barriers/Navigation Needs No barriers at this time  Time Spent with Patient 15

## 2015-04-08 ENCOUNTER — Ambulatory Visit
Admission: RE | Admit: 2015-04-08 | Discharge: 2015-04-08 | Disposition: A | Discharge status: Home / Self Care | Payer: Medicare Other | Source: Ambulatory Visit | Attending: Radiation Oncology | Admitting: Radiation Oncology

## 2015-04-08 DIAGNOSIS — Z510 Encounter for antineoplastic radiation therapy: Secondary | ICD-10-CM | POA: Diagnosis not present

## 2015-04-09 ENCOUNTER — Encounter: Payer: Self-pay | Admitting: Radiation Oncology

## 2015-04-09 ENCOUNTER — Ambulatory Visit
Admission: RE | Admit: 2015-04-09 | Discharge: 2015-04-09 | Disposition: A | Discharge status: Home / Self Care | Payer: Medicare Other | Source: Ambulatory Visit | Attending: Radiation Oncology | Admitting: Radiation Oncology

## 2015-04-09 VITALS — BP 100/67 | HR 116 | Temp 98.2°F | Ht 71.0 in | Wt 129.9 lb

## 2015-04-09 DIAGNOSIS — C3411 Malignant neoplasm of upper lobe, right bronchus or lung: Secondary | ICD-10-CM

## 2015-04-09 DIAGNOSIS — Z510 Encounter for antineoplastic radiation therapy: Secondary | ICD-10-CM | POA: Diagnosis not present

## 2015-04-09 NOTE — Progress Notes (Signed)
Department of Radiation Oncology  Phone:  [phone] Fax:        [phone]  Weekly Treatment Note    Name: Ivan Daniels Date: 04/09/2015 MRN: [id number] DOB: February 13, 1946   Current dose: 58 Gy  Current fraction: 29   MEDICATIONS: Current Outpatient Prescriptions  Medication Sig Dispense Refill  . benztropine (COGENTIN) 0.5 MG tablet Take 0.5 mg by mouth at bedtime.     . clopidogrel (PLAVIX) 75 MG tablet Take 75 mg by mouth daily.     . ferrous fumarate (HEMOCYTE - 106 MG FE) 325 (106 FE) MG TABS tablet Take 1 tablet by mouth daily.     . fluconazole (DIFLUCAN) 200 MG tablet Take 1 tablet (200 mg total) by mouth daily. 13 tablet 0  . haloperidol (HALDOL) 5 MG tablet Take 5 mg by mouth at bedtime.     . lidocaine-prilocaine (EMLA) cream Apply 1 application topically as needed. 30 g 0  . Multiple Vitamins-Minerals (MULTIVITAMIN WITH MINERALS) tablet Take 1 tablet by mouth daily.    . simvastatin (ZOCOR) 20 MG tablet Take 20 mg by mouth at bedtime.     . sucralfate (CARAFATE) 1 G tablet Take 1 tablet (1 g total) by mouth 4 (four) times daily. 120 tablet 2  . Wound Dressings (SONAFINE) Apply 1 application topically 3 (three) times daily.    Marland Kitchen oxyCODONE (OXY IR/ROXICODONE) 5 MG immediate release tablet Take 1 tablet (5 mg total) by mouth every 3 (three) hours as needed for moderate pain. (Patient not taking: Reported on 04/09/2015) 30 tablet 0  . oxyCODONE 10 MG TABS Take 1 tablet (10 mg total) by mouth every 3 (three) hours as needed for severe pain. (Patient not taking: Reported on 04/09/2015) 30 tablet 0  . prochlorperazine (COMPAZINE) 10 MG tablet Take 1 tablet (10 mg total) by mouth every 6 (six) hours as needed for nausea or vomiting. (Patient not taking: Reported on 04/09/2015) 30 tablet 0   No current facility-administered medications for this encounter.     ALLERGIES: Benztropine mesylate and Chlorpromazine hcl   LABORATORY DATA:  Lab Results  Component Value  Date   WBC 2.8* 04/05/2015   HGB 9.9* 04/05/2015   HCT 30.6* 04/05/2015   MCV 90.3 04/05/2015   PLT 193 04/05/2015   Lab Results  Component Value Date   NA 140 04/05/2015   K 4.0 04/05/2015   CL 101 01/26/2015   CO2 28 04/05/2015   Lab Results  Component Value Date   ALT <9 04/05/2015   AST 14 04/05/2015   ALKPHOS 53 04/05/2015   BILITOT <0.30 04/05/2015     NARRATIVE: Ivan Daniels was seen today for weekly treatment management. The chart was checked and the patient's films were reviewed.  Weekly rad txs 29/34 right lung. The patient reports that his appetite is good. Energy level slows down in the afternoon. Skin looks good to right upper back lung area skin scaly using Sonafine cream. Denies any breathing oxygen saturation 100 % today.    BP 100/67 mmHg  Pulse 116  Temp(Src) 98.2 F (36.8 C) (Oral)  Ht '5\' 11"'$  (1.803 m)  Wt 129 lb 14.4 oz (58.922 kg)  BMI 18.13 kg/m2  SpO2 100%  Wt Readings from Last 3 Encounters:  04/09/15 129 lb 14.4 oz (58.922 kg)  04/05/15 128 lb (58.06 kg)  03/30/15 130 lb 4.8 oz (59.104 kg)   3:33 PM   PHYSICAL EXAMINATION: height is '5\' 11"'$  (1.803 m) and weight is 129  lb 14.4 oz (58.922 kg). His oral temperature is 98.2 F (36.8 C). His blood pressure is 100/67 and his pulse is 116. His oxygen saturation is 100%.        ASSESSMENT: The patient is doing satisfactorily with treatment. Continues to take carafate with good relief.  PLAN: We will continue with the patient's radiation treatment as planned. He will finish treatment next week on 12/9. He will follow up with me in a month.       This document serves as a record of services personally performed by Tyler Pita, MD. It was created on his behalf by Lendon Collar, a trained medical scribe. The creation of this record is based on the scribe's personal observations and the provider's statements to them. This document has been checked and approved by the attending provider.

## 2015-04-09 NOTE — Progress Notes (Signed)
Ivan Daniels has received 29 fraction.  Reports that his appetite is good.  Energy level slows down in the afternoon.  Skin looks good to right upper back lung area skin scaly using Sonafine cream. Denies any breathing o 2 sat 100 % today.  BP 100/67 mmHg  Pulse 116  Temp(Src) 98.2 F (36.8 C) (Oral)  Ht '5\' 11"'$  (1.803 m)  Wt 129 lb 14.4 oz (58.922 kg)  BMI 18.13 kg/m2  SpO2 100%  Wt Readings from Last 3 Encounters:  04/09/15 129 lb 14.4 oz (58.922 kg)  04/05/15 128 lb (58.06 kg)  03/30/15 130 lb 4.8 oz (59.104 kg)

## 2015-04-09 NOTE — Progress Notes (Signed)
  Radiation Oncology         [phone] ________________________________  Name: AIKEN WITHEM MRN: [id number]  Date: 03/05/2015  DOB: January 09, 1946  COMPLEX SIMULATION  NOTE  Diagnosis: lung cancer  Narrative The patient has undergone a complex simulation for the patient's upcoming boost treatment.   Radiation dose prior to boost: 60 Gy  Boost dose to the high risk target:  6 Gy, to be delivered in 3 fractions  To accomplish the boost treatment, an additional 5 customized blocks have been designed for this purpose, and each of these complex treatment devices will be used on a daily basis. A complex isodose plan is requested to ensure that the high-risk target region receives the appropriate radiation dose and that the nearby normal structures continue to be appropriately spared. Dose volume histograms for the cumulative plan will be reviewed for the relevant structures.   Total dose after boost:  66 Gy    ________________________________   Jodelle Gross, MD, PhD

## 2015-04-12 ENCOUNTER — Encounter: Payer: Self-pay | Admitting: *Deleted

## 2015-04-12 ENCOUNTER — Ambulatory Visit: Payer: Medicare Other

## 2015-04-12 ENCOUNTER — Other Ambulatory Visit (HOSPITAL_BASED_OUTPATIENT_CLINIC_OR_DEPARTMENT_OTHER): Payer: Medicare Other

## 2015-04-12 ENCOUNTER — Ambulatory Visit
Admission: RE | Admit: 2015-04-12 | Discharge: 2015-04-12 | Disposition: A | Discharge status: Home / Self Care | Payer: Medicare Other | Source: Ambulatory Visit | Attending: Radiation Oncology | Admitting: Radiation Oncology

## 2015-04-12 ENCOUNTER — Ambulatory Visit (HOSPITAL_BASED_OUTPATIENT_CLINIC_OR_DEPARTMENT_OTHER): Payer: Medicare Other | Admitting: Internal Medicine

## 2015-04-12 ENCOUNTER — Encounter: Payer: Self-pay | Admitting: Internal Medicine

## 2015-04-12 ENCOUNTER — Telehealth: Payer: Self-pay | Admitting: Internal Medicine

## 2015-04-12 ENCOUNTER — Ambulatory Visit (HOSPITAL_BASED_OUTPATIENT_CLINIC_OR_DEPARTMENT_OTHER): Payer: Medicare Other

## 2015-04-12 VITALS — BP 123/67 | HR 115 | Temp 98.9°F | Resp 18 | Ht 71.0 in | Wt 130.1 lb

## 2015-04-12 DIAGNOSIS — C3411 Malignant neoplasm of upper lobe, right bronchus or lung: Secondary | ICD-10-CM

## 2015-04-12 DIAGNOSIS — C3490 Malignant neoplasm of unspecified part of unspecified bronchus or lung: Secondary | ICD-10-CM

## 2015-04-12 DIAGNOSIS — Z5111 Encounter for antineoplastic chemotherapy: Secondary | ICD-10-CM

## 2015-04-12 DIAGNOSIS — K208 Other esophagitis: Secondary | ICD-10-CM | POA: Diagnosis not present

## 2015-04-12 DIAGNOSIS — Z510 Encounter for antineoplastic radiation therapy: Secondary | ICD-10-CM | POA: Diagnosis not present

## 2015-04-12 DIAGNOSIS — C3491 Malignant neoplasm of unspecified part of right bronchus or lung: Secondary | ICD-10-CM

## 2015-04-12 LAB — CBC WITH DIFFERENTIAL/PLATELET
BASO%: 0.5 % (ref 0.0–2.0)
Basophils Absolute: 0 10*3/uL (ref 0.0–0.1)
EOS%: 1 % (ref 0.0–7.0)
Eosinophils Absolute: 0 10*3/uL (ref 0.0–0.5)
HCT: 29.2 % — ABNORMAL LOW (ref 38.4–49.9)
HGB: 9.3 g/dL — ABNORMAL LOW (ref 13.0–17.1)
LYMPH%: 24.3 % (ref 14.0–49.0)
MCH: 28.4 pg (ref 27.2–33.4)
MCHC: 31.9 g/dL — ABNORMAL LOW (ref 32.0–36.0)
MCV: 89.2 fL (ref 79.3–98.0)
MONO#: 0.1 10*3/uL (ref 0.1–0.9)
MONO%: 7.5 % (ref 0.0–14.0)
NEUT#: 1.3 10*3/uL — ABNORMAL LOW (ref 1.5–6.5)
NEUT%: 66.7 % (ref 39.0–75.0)
Platelets: 179 10*3/uL (ref 140–400)
RBC: 3.27 10*6/uL — ABNORMAL LOW (ref 4.20–5.82)
RDW: 17.6 % — ABNORMAL HIGH (ref 11.0–14.6)
WBC: 1.9 10*3/uL — ABNORMAL LOW (ref 4.0–10.3)
lymph#: 0.5 10*3/uL — ABNORMAL LOW (ref 0.9–3.3)

## 2015-04-12 LAB — COMPREHENSIVE METABOLIC PANEL
ALT: 10 U/L (ref 0–55)
AST: 17 U/L (ref 5–34)
Albumin: 3.4 g/dL — ABNORMAL LOW (ref 3.5–5.0)
Alkaline Phosphatase: 51 U/L (ref 40–150)
Anion Gap: 8 mEq/L (ref 3–11)
BUN: 8.8 mg/dL (ref 7.0–26.0)
CO2: 27 mEq/L (ref 22–29)
Calcium: 9.5 mg/dL (ref 8.4–10.4)
Chloride: 105 mEq/L (ref 98–109)
Creatinine: 0.9 mg/dL (ref 0.7–1.3)
EGFR: >90 mL/min/{1.73_m2} (ref >90)
Glucose: 88 mg/dl (ref 70–140)
Potassium: 4 mEq/L (ref 3.5–5.1)
Sodium: 140 mEq/L (ref 136–145)
Total Bilirubin: <0.3 mg/dL (ref 0.20–1.20)
Total Protein: 6.9 g/dL (ref 6.4–8.3)

## 2015-04-12 MED ORDER — HEPARIN SOD (PORK) LOCK FLUSH 100 UNIT/ML IV SOLN
500.0000 [IU] | Freq: Once | INTRAVENOUS | Status: AC | PRN
  Administered 2015-04-12: 500 [IU]
  Filled 2015-04-12: qty 5

## 2015-04-12 MED ORDER — DIPHENHYDRAMINE HCL 50 MG/ML IJ SOLN
50.0000 mg | Freq: Once | INTRAMUSCULAR | Status: AC
  Administered 2015-04-12: 50 mg via INTRAVENOUS

## 2015-04-12 MED ORDER — SODIUM CHLORIDE 0.9 % IV SOLN
168.6000 mg | Freq: Once | INTRAVENOUS | Status: AC
  Administered 2015-04-12: 170 mg via INTRAVENOUS
  Filled 2015-04-12: qty 17

## 2015-04-12 MED ORDER — FAMOTIDINE IN NACL 20-0.9 MG/50ML-% IV SOLN
INTRAVENOUS | Status: AC
  Filled 2015-04-12: qty 50

## 2015-04-12 MED ORDER — FAMOTIDINE IN NACL 20-0.9 MG/50ML-% IV SOLN
20.0000 mg | Freq: Once | INTRAVENOUS | Status: AC
Start: 2015-04-12 — End: 2015-04-12
  Administered 2015-04-12: 20 mg via INTRAVENOUS

## 2015-04-12 MED ORDER — PACLITAXEL CHEMO INJECTION 300 MG/50ML
45.0000 mg/m2 | Freq: Once | INTRAVENOUS | Status: AC
  Administered 2015-04-12: 78 mg via INTRAVENOUS
  Filled 2015-04-12: qty 13

## 2015-04-12 MED ORDER — SODIUM CHLORIDE 0.9 % IJ SOLN
10.0000 mL | INTRAMUSCULAR | Status: DC | PRN
  Administered 2015-04-12: 10 mL
  Filled 2015-04-12: qty 10

## 2015-04-12 MED ORDER — SODIUM CHLORIDE 0.9 % IV SOLN
Freq: Once | INTRAVENOUS | Status: AC
  Administered 2015-04-12: 14:00:00 via INTRAVENOUS

## 2015-04-12 MED ORDER — DIPHENHYDRAMINE HCL 50 MG/ML IJ SOLN
INTRAMUSCULAR | Status: AC
  Filled 2015-04-12: qty 1

## 2015-04-12 MED ORDER — SODIUM CHLORIDE 0.9 % IV SOLN
Freq: Once | INTRAVENOUS | Status: AC
  Administered 2015-04-12: 15:00:00 via INTRAVENOUS
  Filled 2015-04-12: qty 8

## 2015-04-12 NOTE — Patient Instructions (Signed)
[facility] Discharge Instructions for Patients Receiving Chemotherapy  Today you received the following chemotherapy agents taxol/carboplatin  To help prevent nausea and vomiting after your treatment, we encourage you to take your nausea medication as directed   If you develop nausea and vomiting that is not controlled by your nausea medication, call the clinic.   BELOW ARE SYMPTOMS THAT SHOULD BE REPORTED IMMEDIATELY:  *FEVER GREATER THAN 100.5 F  *CHILLS WITH OR WITHOUT FEVER  NAUSEA AND VOMITING THAT IS NOT CONTROLLED WITH YOUR NAUSEA MEDICATION  *UNUSUAL SHORTNESS OF BREATH  *UNUSUAL BRUISING OR BLEEDING  TENDERNESS IN MOUTH AND THROAT WITH OR WITHOUT PRESENCE OF ULCERS  *URINARY PROBLEMS  *BOWEL PROBLEMS  UNUSUAL RASH Items with * indicate a potential emergency and should be followed up as soon as possible.  Feel free to call the clinic you have any questions or concerns. The clinic phone number is [phone].  

## 2015-04-12 NOTE — Progress Notes (Signed)
[place] [phone]   [phone]  OFFICE PROGRESS NOTE  [name], MD [address] [place] [address]  DIAGNOSIS: Stage IIIA (T2a, N2, M0) non-small cell lung cancer consistent with invasive squamous cell carcinoma presented with right upper lobe lung mass in addition to right hilar and mediastinal lymphadenopathy diagnosed in September 2016.  PRIOR THERAPY: None.  CURRENT THERAPY: Course of concurrent chemoradiation with weekly carboplatin for AUC of 2 and paclitaxel 45 MG/M2. First dose 03/01/2015. He is status post 5 cycles.  INTERVAL HISTORY: Ivan Daniels 69 y.o. male returns to the clinic today for follow-up visit accompanied by his sister. The patient has been tolerating his course of concurrent chemoradiation fairly well except for radiation induced esophagitis and dry cough. He is also on Carafate 1 g by mouth 4 times a day. The patient also has Percocet for pain management. His last fraction of radiation is scheduled for 04/15/2015. He denied having any significant fever or chills. He has no nausea or vomiting. He denied having any significant chest pain, shortness of breath, cough or hemoptysis. He is here today to start cycle #6 of his chemotherapy.  MEDICAL HISTORY: Past Medical History  Diagnosis Date  . Hyperlipidemia   . TOBACCO ABUSE 05/24/2009    Qualifier: Diagnosis of  By: Hassell Done FNP, Tori Milks    . Paranoid schizophrenia ([place]) 10/31/2009    Qualifier: Diagnosis of  By: Jorene Minors, [place]    . BENIGN PROSTATIC HYPERTROPHY, HX OF 12/19/2006    Qualifier: Diagnosis of  By: Radene Ou MD, [place]    . Primary spontaneous pneumothorax, left 01/25/2015  . Lung mass 01/25/2015    3.7 x 3.6 cm RUL spiculated lung mass with 2.0 cm right paratracheal lymph node suspicious for bronchogenic CA  . Full code status 02/10/2015  . Allergy   . ALCOHOL ABUSE 11/12/2009    Qualifier: Diagnosis of  By: Hassell Done FNP, Tori Milks     . Non-small cell lung cancer ([place]) 01/27/15    non small-cell,squamous cell ca RUL    ALLERGIES:  is allergic to benztropine mesylate and chlorpromazine hcl.  MEDICATIONS:  Current Outpatient Prescriptions  Medication Sig Dispense Refill  . benztropine (COGENTIN) 0.5 MG tablet Take 0.5 mg by mouth at bedtime.     . clopidogrel (PLAVIX) 75 MG tablet Take 75 mg by mouth daily.     . ferrous fumarate (HEMOCYTE - 106 MG FE) 325 (106 FE) MG TABS tablet Take 1 tablet by mouth daily.     . fluconazole (DIFLUCAN) 200 MG tablet Take 1 tablet (200 mg total) by mouth daily. 13 tablet 0  . haloperidol (HALDOL) 5 MG tablet Take 5 mg by mouth at bedtime.     . lidocaine-prilocaine (EMLA) cream Apply 1 application topically as needed. 30 g 0  . Multiple Vitamins-Minerals (MULTIVITAMIN WITH MINERALS) tablet Take 1 tablet by mouth daily.    Marland Kitchen oxyCODONE (OXY IR/ROXICODONE) 5 MG immediate release tablet Take 1 tablet (5 mg total) by mouth every 3 (three) hours as needed for moderate pain. (Patient not taking: Reported on 04/09/2015) 30 tablet 0  . oxyCODONE 10 MG TABS Take 1 tablet (10 mg total) by mouth every 3 (three) hours as needed for severe pain. (Patient not taking: Reported on 04/09/2015) 30 tablet 0  . prochlorperazine (COMPAZINE) 10 MG tablet Take 1 tablet (10 mg total) by mouth every 6 (six) hours as needed for nausea or vomiting. (Patient not taking: Reported  on 04/09/2015) 30 tablet 0  . simvastatin (ZOCOR) 20 MG tablet Take 20 mg by mouth at bedtime.     . sucralfate (CARAFATE) 1 G tablet Take 1 tablet (1 g total) by mouth 4 (four) times daily. 120 tablet 2  . Wound Dressings (SONAFINE) Apply 1 application topically 3 (three) times daily.     No current facility-administered medications for this visit.    SURGICAL HISTORY:  Past Surgical History  Procedure Laterality Date  . Video bronchoscopy with endobronchial ultrasound N/A 01/27/2015    Procedure: VIDEO BRONCHOSCOPY WITH ENDOBRONCHIAL  ULTRASOUND;  Surgeon: Grace Isaac, MD;  Location: [facility] OR;  Service: Thoracic;  Laterality: N/A;  . Lung biopsy Right 01/27/2015    Procedure: Right Upper Lobe Bronchus BIOPSY;  Surgeon: Grace Isaac, MD;  Location: [place];  Service: Thoracic;  Laterality: Right;    REVIEW OF SYSTEMS:  A comprehensive review of systems was negative except for: Constitutional: positive for fatigue Gastrointestinal: positive for dyspepsia, dysphagia and odynophagia   PHYSICAL EXAMINATION: General appearance: alert, cooperative and no distress Head: Normocephalic, without obvious abnormality, atraumatic Neck: no adenopathy, no JVD, supple, symmetrical, trachea midline and thyroid not enlarged, symmetric, no tenderness/mass/nodules Lymph nodes: Cervical, supraclavicular, and axillary nodes normal. Resp: clear to auscultation bilaterally Back: symmetric, no curvature. ROM normal. No CVA tenderness. Cardio: regular rate and rhythm, S1, S2 normal, no murmur, click, rub or gallop GI: soft, non-tender; bowel sounds normal; no masses,  no organomegaly Extremities: extremities normal, atraumatic, no cyanosis or edema  ECOG PERFORMANCE STATUS: 1 - Symptomatic but completely ambulatory  Blood pressure 123/67, pulse 115, temperature 98.9 F (37.2 C), temperature source Oral, resp. rate 18, height '5\' 11"'$  (1.803 m), weight 130 lb 1.6 oz (59.013 kg), SpO2 100 %.  LABORATORY DATA: Lab Results  Component Value Date   WBC 1.9* 04/12/2015   HGB 9.3* 04/12/2015   HCT 29.2* 04/12/2015   MCV 89.2 04/12/2015   PLT 179 04/12/2015      Chemistry      Component Value Date/Time   NA 140 04/12/2015 1233   NA 138 01/26/2015 1454   K 4.0 04/12/2015 1233   K 3.7 01/26/2015 1454   CL 101 01/26/2015 1454   CO2 27 04/12/2015 1233   CO2 28 01/26/2015 1454   BUN 8.8 04/12/2015 1233   BUN 9 01/26/2015 1454   CREATININE 0.9 04/12/2015 1233   CREATININE 0.95 01/26/2015 1454      Component Value Date/Time   CALCIUM 9.5  04/12/2015 1233   CALCIUM 9.1 01/26/2015 1454   ALKPHOS 51 04/12/2015 1233   ALKPHOS 52 01/26/2015 1454   AST 17 04/12/2015 1233   AST 21 01/26/2015 1454   ALT 10 04/12/2015 1233   ALT 11* 01/26/2015 1454   BILITOT <0.30 04/12/2015 1233   BILITOT <0.1* 01/26/2015 1454       RADIOGRAPHIC STUDIES: Dg Chest 2 View  03/30/2015  CLINICAL DATA:  New difficulty swallowing. Chest pain. History of right lung cancer. EXAM: CHEST  2 VIEW COMPARISON:  02/03/2015 FINDINGS: Right suprahilar mass with mild right upper lobe volume loss. Emphysematous changes at the apices. There is no edema, consolidation, effusion, or pneumothorax. Normal heart size and aortic contours. Left nipple shadow noted.  Clear left lung. Right-sided porta catheter, tip at the SVC level. IMPRESSION: 1. No acute finding or change from September 2016. 2. Right upper lobe cancer with chronic postobstructive volume loss. Electronically Signed   By: Neva Seat.D.  On: 03/30/2015 08:06    ASSESSMENT AND PLAN: This is a very pleasant 69 years old African-American male with a stage IIIA non-small cell lung cancer and started on concurrent chemoradiation with weekly carboplatin and paclitaxel. Status post 5 week of treatment. He is tolerating the treatment well except for the radiation induced esophagitis. I recommended for the patient to continue his current treatment with Percocet and Carafate. I recommended for the patient to proceed with cycle #5 today as a scheduled. I would see the patient back for follow-up visit in 6 weeks for reevaluation after repeating CT scan of the chest for restaging of his disease. The patient was advised to call immediately if he has any concerning symptoms in the interval. The patient voices understanding of current disease status and treatment options and is in agreement with the current care plan.  All questions were answered. The patient knows to call the clinic with any problems, questions or  concerns. We can certainly see the patient much sooner if necessary.  Disclaimer: This note was dictated with voice recognition software. Similar sounding words can inadvertently be transcribed and may not be corrected upon review.

## 2015-04-12 NOTE — Progress Notes (Signed)
Oncology Nurse Navigator Documentation  Oncology Nurse Navigator Flowsheets 04/12/2015  Navigator Encounter Type Treatment/spoke with patient and sister today during his treatment.  He is doing well.  No barriers identified at this time.  I asked that he call me if needed.   Patient Visit Type Follow-up  Treatment Phase Treatment  Barriers/Navigation Needs No barriers at this time  Time Spent with Patient 15

## 2015-04-12 NOTE — Telephone Encounter (Signed)
Gave and printedc appt sched and avs for pt for DEc and Jan

## 2015-04-12 NOTE — Progress Notes (Signed)
Ok to treat with low counts per Dr. Julien Nordmann.

## 2015-04-13 ENCOUNTER — Ambulatory Visit
Admission: RE | Admit: 2015-04-13 | Discharge: 2015-04-13 | Disposition: A | Discharge status: Home / Self Care | Payer: Medicare Other | Source: Ambulatory Visit | Attending: Radiation Oncology | Admitting: Radiation Oncology

## 2015-04-13 DIAGNOSIS — Z510 Encounter for antineoplastic radiation therapy: Secondary | ICD-10-CM | POA: Diagnosis not present

## 2015-04-14 ENCOUNTER — Ambulatory Visit
Admission: RE | Admit: 2015-04-14 | Discharge: 2015-04-14 | Disposition: A | Discharge status: Home / Self Care | Payer: Medicare Other | Source: Ambulatory Visit | Attending: Radiation Oncology | Admitting: Radiation Oncology

## 2015-04-14 DIAGNOSIS — Z510 Encounter for antineoplastic radiation therapy: Secondary | ICD-10-CM | POA: Diagnosis not present

## 2015-04-15 ENCOUNTER — Encounter: Payer: Self-pay | Admitting: Radiation Oncology

## 2015-04-15 ENCOUNTER — Ambulatory Visit
Admission: RE | Admit: 2015-04-15 | Discharge: 2015-04-15 | Disposition: A | Discharge status: Home / Self Care | Payer: Medicare Other | Source: Ambulatory Visit | Attending: Radiation Oncology | Admitting: Radiation Oncology

## 2015-04-15 ENCOUNTER — Telehealth: Payer: Self-pay | Admitting: Medical Oncology

## 2015-04-15 ENCOUNTER — Encounter: Payer: Self-pay | Admitting: Medical Oncology

## 2015-04-15 VITALS — BP 102/69 | HR 110 | Temp 99.0°F | Ht 71.0 in | Wt 131.6 lb

## 2015-04-15 DIAGNOSIS — Z510 Encounter for antineoplastic radiation therapy: Secondary | ICD-10-CM | POA: Diagnosis not present

## 2015-04-15 DIAGNOSIS — C3490 Malignant neoplasm of unspecified part of unspecified bronchus or lung: Secondary | ICD-10-CM

## 2015-04-15 NOTE — Progress Notes (Signed)
Department of Radiation Oncology  Phone:  [phone] Fax:        [phone]  Weekly Treatment Note    Name: Ivan Daniels Date: 04/15/2015 MRN: [id number] DOB: 1945-07-24   Current dose: 66 Gy  Current fraction: 33   MEDICATIONS: Current Outpatient Prescriptions  Medication Sig Dispense Refill  . benztropine (COGENTIN) 0.5 MG tablet Take 0.5 mg by mouth at bedtime.     . clopidogrel (PLAVIX) 75 MG tablet Take 75 mg by mouth daily.     . ferrous fumarate (HEMOCYTE - 106 MG FE) 325 (106 FE) MG TABS tablet Take 1 tablet by mouth daily.     . fluconazole (DIFLUCAN) 200 MG tablet Take 1 tablet (200 mg total) by mouth daily. 13 tablet 0  . haloperidol (HALDOL) 5 MG tablet Take 5 mg by mouth at bedtime.     . lidocaine-prilocaine (EMLA) cream Apply 1 application topically as needed. 30 g 0  . Multiple Vitamins-Minerals (MULTIVITAMIN WITH MINERALS) tablet Take 1 tablet by mouth daily.    . simvastatin (ZOCOR) 20 MG tablet Take 20 mg by mouth at bedtime.     . sucralfate (CARAFATE) 1 G tablet Take 1 tablet (1 g total) by mouth 4 (four) times daily. 120 tablet 2  . Wound Dressings (SONAFINE) Apply 1 application topically 3 (three) times daily.    Marland Kitchen oxyCODONE (OXY IR/ROXICODONE) 5 MG immediate release tablet Take 1 tablet (5 mg total) by mouth every 3 (three) hours as needed for moderate pain. (Patient not taking: Reported on 04/09/2015) 30 tablet 0  . oxyCODONE 10 MG TABS Take 1 tablet (10 mg total) by mouth every 3 (three) hours as needed for severe pain. (Patient not taking: Reported on 04/09/2015) 30 tablet 0  . prochlorperazine (COMPAZINE) 10 MG tablet Take 1 tablet (10 mg total) by mouth every 6 (six) hours as needed for nausea or vomiting. (Patient not taking: Reported on 04/09/2015) 30 tablet 0   No current facility-administered medications for this encounter.     ALLERGIES: Benztropine mesylate and Chlorpromazine hcl   LABORATORY DATA:  Lab Results  Component Value  Date   WBC 1.9* 04/12/2015   HGB 9.3* 04/12/2015   HCT 29.2* 04/12/2015   MCV 89.2 04/12/2015   PLT 179 04/12/2015   Lab Results  Component Value Date   NA 140 04/12/2015   K 4.0 04/12/2015   CL 101 01/26/2015   CO2 27 04/12/2015   Lab Results  Component Value Date   ALT 10 04/12/2015   AST 17 04/12/2015   ALKPHOS 51 04/12/2015   BILITOT <0.30 04/12/2015     NARRATIVE: Ivan Daniels was seen today for weekly treatment management. The chart was checked and the patient's films were reviewed.  [name] presents for his last treatment of radiation to his right lung. He states he is feeling better and eating ok. He denies shortness of breath at this time. He reports he feels dizzy at times when he stands up. His skin to his upper mid to right back is hyperpigmented but not tender. He is sonafine twice daily. They have no other concerns at this time.      BP 102/69 mmHg  Pulse 110  Temp(Src) 99 F (37.2 C)  Ht '5\' 11"'$  (1.803 m)  Wt 131 lb 9.6 oz (59.693 kg)  BMI 18.36 kg/m2  SpO2 100%  Wt Readings from Last 3 Encounters:  04/15/15 131 lb 9.6 oz (59.693 kg)  04/12/15 130 lb 1.6 oz (  59.013 kg)  04/09/15 129 lb 14.4 oz (58.922 kg)   5:09 PM   PHYSICAL EXAMINATION: height is '5\' 11"'$  (1.803 m) and weight is 131 lb 9.6 oz (59.693 kg). His temperature is 99 F (37.2 C). His blood pressure is 102/69 and his pulse is 110. His oxygen saturation is 100%.        ASSESSMENT: The patient is doing satisfactorily with treatment. Continues to take carafate with good relief.  PLAN: We will continue with the patient's radiation treatment as planned. He will follow up with Dr. Lisbeth Renshaw in a month.       This document serves as a record of services personally performed by Tyler Pita, MD. It was created on his behalf by Lendon Collar, a trained medical scribe. The creation of this record is based on the scribe's personal observations and the provider's statements to them. This  document has been checked and approved by the attending provider.

## 2015-04-15 NOTE — Telephone Encounter (Signed)
PCP isd Priscille Loveless at [facility] [phone]

## 2015-04-15 NOTE — Progress Notes (Addendum)
Ivan Daniels presents for his last treatment of radiation to his R Lung. He states he is feeling better, and eating ok. He denies shortness of breath at this time. He reports he feels dizzy at times when he stands up. His skin to his upper mid to right back is hyperpigmented but not tender. He is sonafine twice daily.  They have no other concerns at this time.   BP 102/69 mmHg  Pulse 110  Temp(Src) 99 F (37.2 C)  Ht '5\' 11"'$  (1.803 m)  Wt 131 lb 9.6 oz (59.693 kg)  BMI 18.36 kg/m2  SpO2 100%  Othostatics: BP sitting 102/69 pulse 110. BP standing 119/66 pulse 113.   Wt Readings from Last 3 Encounters:  04/15/15 131 lb 9.6 oz (59.693 kg)  04/12/15 130 lb 1.6 oz (59.013 kg)  04/09/15 129 lb 14.4 oz (58.922 kg)

## 2015-04-19 ENCOUNTER — Telehealth: Payer: Self-pay | Admitting: *Deleted

## 2015-04-19 ENCOUNTER — Other Ambulatory Visit (HOSPITAL_BASED_OUTPATIENT_CLINIC_OR_DEPARTMENT_OTHER): Payer: Medicare Other

## 2015-04-19 DIAGNOSIS — C3490 Malignant neoplasm of unspecified part of unspecified bronchus or lung: Secondary | ICD-10-CM

## 2015-04-19 LAB — COMPREHENSIVE METABOLIC PANEL
ALT: 13 U/L (ref 0–55)
AST: 18 U/L (ref 5–34)
Albumin: 3.5 g/dL (ref 3.5–5.0)
Alkaline Phosphatase: 45 U/L (ref 40–150)
Anion Gap: 8 mEq/L (ref 3–11)
BUN: 7.1 mg/dL (ref 7.0–26.0)
CO2: 28 mEq/L (ref 22–29)
Calcium: 9.2 mg/dL (ref 8.4–10.4)
Chloride: 104 mEq/L (ref 98–109)
Creatinine: 0.9 mg/dL (ref 0.7–1.3)
EGFR: >90 mL/min/{1.73_m2} (ref >90)
Glucose: 101 mg/dl (ref 70–140)
Potassium: 4.2 mEq/L (ref 3.5–5.1)
Sodium: 140 mEq/L (ref 136–145)
Total Bilirubin: <0.3 mg/dL (ref 0.20–1.20)
Total Protein: 7.2 g/dL (ref 6.4–8.3)

## 2015-04-19 LAB — CBC WITH DIFFERENTIAL/PLATELET
BASO%: 0 % (ref 0.0–2.0)
Basophils Absolute: 0 10*3/uL (ref 0.0–0.1)
EOS%: 0.9 % (ref 0.0–7.0)
Eosinophils Absolute: 0 10*3/uL (ref 0.0–0.5)
HCT: 28.7 % — ABNORMAL LOW (ref 38.4–49.9)
HGB: 9.2 g/dL — ABNORMAL LOW (ref 13.0–17.1)
LYMPH%: 43.4 % (ref 14.0–49.0)
MCH: 28.4 pg (ref 27.2–33.4)
MCHC: 32.1 g/dL (ref 32.0–36.0)
MCV: 88.6 fL (ref 79.3–98.0)
MONO#: 0.2 10*3/uL (ref 0.1–0.9)
MONO%: 15 % — ABNORMAL HIGH (ref 0.0–14.0)
NEUT#: 0.5 10*3/uL — CL (ref 1.5–6.5)
NEUT%: 40.7 % (ref 39.0–75.0)
Platelets: 193 10*3/uL (ref 140–400)
RBC: 3.24 10*6/uL — ABNORMAL LOW (ref 4.20–5.82)
RDW: 17.4 % — ABNORMAL HIGH (ref 11.0–14.6)
WBC: 1.1 10*3/uL — ABNORMAL LOW (ref 4.0–10.3)
lymph#: 0.5 10*3/uL — ABNORMAL LOW (ref 0.9–3.3)
nRBC: 0 % (ref 0–0)

## 2015-04-19 NOTE — Progress Notes (Signed)
Quick Note:  Call patient with the result and Provide neutropenic precautions. ______

## 2015-04-19 NOTE — Telephone Encounter (Signed)
Pt is asleep- spoke with sister- informed of neutropenic precautions. Verbalized understanding

## 2015-04-19 NOTE — Telephone Encounter (Signed)
-----   Message from Ardeen Garland, RN sent at 04/19/2015  4:09 PM EST -----   ----- Message -----    From: Curt Bears, MD    Sent: 04/19/2015   2:04 PM      To: Lucile Crater, RN, Ardeen Garland, RN  Call patient with the result and Provide neutropenic precautions.

## 2015-04-24 NOTE — Progress Notes (Signed)
  Radiation Oncology         [phone] ________________________________  Name: KELLEY KNOTH MRN: [id number]  Date: 04/15/2015  DOB: 12/01/1945  End of Treatment Note  Diagnosis:   Non-small cell lung cancer ([place])   Staging form: Lung, AJCC 7th Edition     Clinical: Stage IIIA (T2a, N2, M0) - Signed by Curt Bears, MD on 02/10/2015       Staging comments: Squamous cell ca      Indication for treatment::  curative       Radiation treatment dates:   03/01/2015 through 12/08/201  Site/dose:   The patient was treated to the gross disease within the right lung initially to a dose of 60 gray in 30 fractions using a 5 field 3-D conformal technique. The patient then received a 6 gray boost using a cone down 5 field technique. The patient's total dose was 66 gray. This was given with concurrent chemotherapy.  Narrative: The patient tolerated radiation treatment relatively well.   Esophagitis was managed during his course of treatment.  Plan: The patient has completed radiation treatment. The patient will return to radiation oncology clinic for routine followup in one month. I advised the patient to call or return sooner if they have any questions or concerns related to their recovery or treatment. ________________________________  Jodelle Gross, M.D., Ph.D.

## 2015-04-26 ENCOUNTER — Other Ambulatory Visit (HOSPITAL_BASED_OUTPATIENT_CLINIC_OR_DEPARTMENT_OTHER): Payer: Medicare Other

## 2015-04-26 DIAGNOSIS — C3491 Malignant neoplasm of unspecified part of right bronchus or lung: Secondary | ICD-10-CM

## 2015-04-26 LAB — COMPREHENSIVE METABOLIC PANEL
ALT: 10 U/L (ref 0–55)
AST: 16 U/L (ref 5–34)
Albumin: 3.5 g/dL (ref 3.5–5.0)
Alkaline Phosphatase: 50 U/L (ref 40–150)
Anion Gap: 9 mEq/L (ref 3–11)
BUN: 9 mg/dL (ref 7.0–26.0)
CO2: 26 mEq/L (ref 22–29)
Calcium: 9.2 mg/dL (ref 8.4–10.4)
Chloride: 104 mEq/L (ref 98–109)
Creatinine: 1 mg/dL (ref 0.7–1.3)
EGFR: >90 mL/min/{1.73_m2} (ref >90)
Glucose: 92 mg/dl (ref 70–140)
Potassium: 4.1 mEq/L (ref 3.5–5.1)
Sodium: 139 mEq/L (ref 136–145)
Total Bilirubin: <0.3 mg/dL (ref 0.20–1.20)
Total Protein: 7.3 g/dL (ref 6.4–8.3)

## 2015-04-26 LAB — CBC WITH DIFFERENTIAL/PLATELET
BASO%: 0.9 % (ref 0.0–2.0)
Basophils Absolute: 0 10*3/uL (ref 0.0–0.1)
EOS%: 0.2 % (ref 0.0–7.0)
Eosinophils Absolute: 0 10*3/uL (ref 0.0–0.5)
HCT: 30.4 % — ABNORMAL LOW (ref 38.4–49.9)
HGB: 9.8 g/dL — ABNORMAL LOW (ref 13.0–17.1)
LYMPH%: 30.4 % (ref 14.0–49.0)
MCH: 28.4 pg (ref 27.2–33.4)
MCHC: 32.2 g/dL (ref 32.0–36.0)
MCV: 88.1 fL (ref 79.3–98.0)
MONO#: 0.5 10*3/uL (ref 0.1–0.9)
MONO%: 20 % — ABNORMAL HIGH (ref 0.0–14.0)
NEUT#: 1.3 10*3/uL — ABNORMAL LOW (ref 1.5–6.5)
NEUT%: 48.5 % (ref 39.0–75.0)
Platelets: 291 10*3/uL (ref 140–400)
RBC: 3.45 10*6/uL — ABNORMAL LOW (ref 4.20–5.82)
RDW: 18.6 % — ABNORMAL HIGH (ref 11.0–14.6)
WBC: 2.7 10*3/uL — ABNORMAL LOW (ref 4.0–10.3)
lymph#: 0.8 10*3/uL — ABNORMAL LOW (ref 0.9–3.3)

## 2015-04-28 ENCOUNTER — Ambulatory Visit (INDEPENDENT_AMBULATORY_CARE_PROVIDER_SITE_OTHER): Payer: Medicare Other | Admitting: Podiatry

## 2015-04-28 DIAGNOSIS — Q828 Other specified congenital malformations of skin: Secondary | ICD-10-CM | POA: Diagnosis not present

## 2015-04-28 NOTE — Progress Notes (Signed)
Subjective:     Patient ID: Ivan Daniels, male   DOB: [birth year], 69 y.o.   MRN: [id number]  HPIThis patient presents to my office with painful areas on the bottom of both feet.  He says he lost his toes to frostbite and now develops callused areas on the bottom of both feet.   Review of Systems     Objective:   Physical Exam Objective: Review of past medical history, medications, social history and allergies were performed.  Vascular: Dorsalis pedis and posterior tibial pulses were palpable B/L, capillary refill was  WNL B/L, temperature gradient was WNL B/L   Skin:  No signs of symptoms of infection or ulcers on both feet.  Porokeratosis sub2 right foot and sub 3,4 th left foot.   Sensory: Thornell Mule monifilament WNL   Orthopedic: Orthopedic evaluation demonstrates all joints distal t ankle have full ROM without crepitus, muscle power WNL B/L    Assessment:     Porokeratosis B/L     Plan:    Debridement of porokeratosis RTC 3 months    Gardiner Barefoot DPM

## 2015-05-04 ENCOUNTER — Other Ambulatory Visit (HOSPITAL_BASED_OUTPATIENT_CLINIC_OR_DEPARTMENT_OTHER): Payer: Medicare Other

## 2015-05-04 ENCOUNTER — Telehealth: Payer: Self-pay | Admitting: *Deleted

## 2015-05-04 DIAGNOSIS — C3491 Malignant neoplasm of unspecified part of right bronchus or lung: Secondary | ICD-10-CM

## 2015-05-04 LAB — CBC WITH DIFFERENTIAL/PLATELET
BASO%: 0.1 % (ref 0.0–2.0)
Basophils Absolute: 0 10*3/uL (ref 0.0–0.1)
EOS%: 0.7 % (ref 0.0–7.0)
Eosinophils Absolute: 0.1 10*3/uL (ref 0.0–0.5)
HCT: 31.8 % — ABNORMAL LOW (ref 38.4–49.9)
HGB: 10 g/dL — ABNORMAL LOW (ref 13.0–17.1)
LYMPH%: 20.8 % (ref 14.0–49.0)
MCH: 27.9 pg (ref 27.2–33.4)
MCHC: 31.4 g/dL — ABNORMAL LOW (ref 32.0–36.0)
MCV: 88.8 fL (ref 79.3–98.0)
MONO#: 1.1 10*3/uL — ABNORMAL HIGH (ref 0.1–0.9)
MONO%: 15.3 % — ABNORMAL HIGH (ref 0.0–14.0)
NEUT#: 4.3 10*3/uL (ref 1.5–6.5)
NEUT%: 63.1 % (ref 39.0–75.0)
Platelets: 243 10*3/uL (ref 140–400)
RBC: 3.58 10*6/uL — ABNORMAL LOW (ref 4.20–5.82)
RDW: 18.7 % — ABNORMAL HIGH (ref 11.0–14.6)
WBC: 6.9 10*3/uL (ref 4.0–10.3)
lymph#: 1.4 10*3/uL (ref 0.9–3.3)

## 2015-05-04 LAB — COMPREHENSIVE METABOLIC PANEL
ALT: 12 U/L (ref 0–55)
AST: 16 U/L (ref 5–34)
Albumin: 3.2 g/dL — ABNORMAL LOW (ref 3.5–5.0)
Alkaline Phosphatase: 60 U/L (ref 40–150)
Anion Gap: 11 mEq/L (ref 3–11)
BUN: 6.9 mg/dL — ABNORMAL LOW (ref 7.0–26.0)
CO2: 27 mEq/L (ref 22–29)
Calcium: 9.6 mg/dL (ref 8.4–10.4)
Chloride: 100 mEq/L (ref 98–109)
Creatinine: 1 mg/dL (ref 0.7–1.3)
EGFR: 87 mL/min/{1.73_m2} — ABNORMAL LOW (ref >90)
Glucose: 98 mg/dl (ref 70–140)
Potassium: 3.8 mEq/L (ref 3.5–5.1)
Sodium: 138 mEq/L (ref 136–145)
Total Bilirubin: 0.33 mg/dL (ref 0.20–1.20)
Total Protein: 7.7 g/dL (ref 6.4–8.3)

## 2015-05-04 NOTE — Telephone Encounter (Signed)
Pt report to [facility] and filled out a walk in form. Pt is concerned about a rash that has developed around radiation sites. This nurse went to check on rash.   Pt has discolored skin patches mid scapular region. Much lighter than pt baseline skin color. Evidence of new skin growth. Pt denies any pain, itchiness. No weeping, no open wounds elevations or blisters. Pt alarmed by skin discoloration.   Pt advised to continue to use mild soap and apply moisturizers as previously prescribed. Samples of Aquafor given to pt. Assured pt this is new skin growth.   Advised pt to call this office with any further concerns or if area changes. Pt comforted and verbalized an understanding.

## 2015-05-09 DIAGNOSIS — Z5189 Encounter for other specified aftercare: Secondary | ICD-10-CM

## 2015-05-09 HISTORY — DX: Encounter for other specified aftercare: Z51.89

## 2015-05-11 ENCOUNTER — Other Ambulatory Visit (HOSPITAL_BASED_OUTPATIENT_CLINIC_OR_DEPARTMENT_OTHER): Payer: Medicare Other

## 2015-05-11 DIAGNOSIS — C3411 Malignant neoplasm of upper lobe, right bronchus or lung: Secondary | ICD-10-CM

## 2015-05-11 DIAGNOSIS — C3491 Malignant neoplasm of unspecified part of right bronchus or lung: Secondary | ICD-10-CM

## 2015-05-11 LAB — CBC WITH DIFFERENTIAL/PLATELET
BASO%: 0.9 % (ref 0.0–2.0)
Basophils Absolute: 0.1 10*3/uL (ref 0.0–0.1)
EOS%: 1.9 % (ref 0.0–7.0)
Eosinophils Absolute: 0.1 10*3/uL (ref 0.0–0.5)
HCT: 32.5 % — ABNORMAL LOW (ref 38.4–49.9)
HGB: 10.3 g/dL — ABNORMAL LOW (ref 13.0–17.1)
LYMPH%: 29.7 % (ref 14.0–49.0)
MCH: 27.7 pg (ref 27.2–33.4)
MCHC: 31.8 g/dL — ABNORMAL LOW (ref 32.0–36.0)
MCV: 87 fL (ref 79.3–98.0)
MONO#: 0.6 10*3/uL (ref 0.1–0.9)
MONO%: 9.9 % (ref 0.0–14.0)
NEUT#: 3.3 10*3/uL (ref 1.5–6.5)
NEUT%: 57.6 % (ref 39.0–75.0)
Platelets: 335 10*3/uL (ref 140–400)
RBC: 3.73 10*6/uL — ABNORMAL LOW (ref 4.20–5.82)
RDW: 20.2 % — ABNORMAL HIGH (ref 11.0–14.6)
WBC: 5.7 10*3/uL (ref 4.0–10.3)
lymph#: 1.7 10*3/uL (ref 0.9–3.3)

## 2015-05-11 LAB — COMPREHENSIVE METABOLIC PANEL
ALT: 14 U/L (ref 0–55)
AST: 18 U/L (ref 5–34)
Albumin: 3.4 g/dL — ABNORMAL LOW (ref 3.5–5.0)
Alkaline Phosphatase: 57 U/L (ref 40–150)
Anion Gap: 9 mEq/L (ref 3–11)
BUN: 8.7 mg/dL (ref 7.0–26.0)
CO2: 29 mEq/L (ref 22–29)
Calcium: 9.3 mg/dL (ref 8.4–10.4)
Chloride: 101 mEq/L (ref 98–109)
Creatinine: 0.9 mg/dL (ref 0.7–1.3)
EGFR: >90 mL/min/{1.73_m2} (ref >90)
Glucose: 82 mg/dl (ref 70–140)
Potassium: 4.3 mEq/L (ref 3.5–5.1)
Sodium: 139 mEq/L (ref 136–145)
Total Bilirubin: <0.3 mg/dL (ref 0.20–1.20)
Total Protein: 7.8 g/dL (ref 6.4–8.3)

## 2015-05-12 ENCOUNTER — Encounter: Payer: Self-pay | Admitting: *Deleted

## 2015-05-17 ENCOUNTER — Other Ambulatory Visit (HOSPITAL_BASED_OUTPATIENT_CLINIC_OR_DEPARTMENT_OTHER): Payer: Medicare Other

## 2015-05-17 DIAGNOSIS — C3411 Malignant neoplasm of upper lobe, right bronchus or lung: Secondary | ICD-10-CM

## 2015-05-17 LAB — COMPREHENSIVE METABOLIC PANEL
ALT: 11 U/L (ref 0–55)
AST: 18 U/L (ref 5–34)
Albumin: 3.6 g/dL (ref 3.5–5.0)
Alkaline Phosphatase: 60 U/L (ref 40–150)
Anion Gap: 9 mEq/L (ref 3–11)
BUN: 7.4 mg/dL (ref 7.0–26.0)
CO2: 29 mEq/L (ref 22–29)
Calcium: 10 mg/dL (ref 8.4–10.4)
Chloride: 102 mEq/L (ref 98–109)
Creatinine: 1.1 mg/dL (ref 0.7–1.3)
EGFR: 78 mL/min/{1.73_m2} — ABNORMAL LOW (ref >90)
Glucose: 91 mg/dl (ref 70–140)
Potassium: 3.5 mEq/L (ref 3.5–5.1)
Sodium: 141 mEq/L (ref 136–145)
Total Bilirubin: <0.3 mg/dL (ref 0.20–1.20)
Total Protein: 8 g/dL (ref 6.4–8.3)

## 2015-05-17 LAB — CBC WITH DIFFERENTIAL/PLATELET
BASO%: 0.7 % (ref 0.0–2.0)
Basophils Absolute: 0 10*3/uL (ref 0.0–0.1)
EOS%: 3.1 % (ref 0.0–7.0)
Eosinophils Absolute: 0.2 10*3/uL (ref 0.0–0.5)
HCT: 36.3 % — ABNORMAL LOW (ref 38.4–49.9)
HGB: 11.2 g/dL — ABNORMAL LOW (ref 13.0–17.1)
LYMPH%: 36.7 % (ref 14.0–49.0)
MCH: 27 pg — ABNORMAL LOW (ref 27.2–33.4)
MCHC: 30.9 g/dL — ABNORMAL LOW (ref 32.0–36.0)
MCV: 87.4 fL (ref 79.3–98.0)
MONO#: 0.6 10*3/uL (ref 0.1–0.9)
MONO%: 11.7 % (ref 0.0–14.0)
NEUT#: 2.4 10*3/uL (ref 1.5–6.5)
NEUT%: 47.8 % (ref 39.0–75.0)
Platelets: 339 10*3/uL (ref 140–400)
RBC: 4.16 10*6/uL — ABNORMAL LOW (ref 4.20–5.82)
RDW: 20.5 % — ABNORMAL HIGH (ref 11.0–14.6)
WBC: 5 10*3/uL (ref 4.0–10.3)
lymph#: 1.9 10*3/uL (ref 0.9–3.3)

## 2015-05-21 ENCOUNTER — Ambulatory Visit
Admission: RE | Admit: 2015-05-21 | Discharge: 2015-05-21 | Disposition: A | Discharge status: Home / Self Care | Payer: Medicare Other | Source: Ambulatory Visit | Attending: Radiation Oncology | Admitting: Radiation Oncology

## 2015-05-21 ENCOUNTER — Encounter (HOSPITAL_COMMUNITY): Payer: Self-pay

## 2015-05-21 ENCOUNTER — Ambulatory Visit (HOSPITAL_COMMUNITY)
Admission: RE | Admit: 2015-05-21 | Discharge: 2015-05-21 | Disposition: A | Discharge status: Home / Self Care | Payer: Medicare Other | Source: Ambulatory Visit | Attending: Internal Medicine | Admitting: Internal Medicine

## 2015-05-21 ENCOUNTER — Encounter: Payer: Self-pay | Admitting: Radiation Oncology

## 2015-05-21 VITALS — BP 111/73 | HR 73 | Temp 97.8°F | Resp 20 | Ht 71.0 in | Wt 132.9 lb

## 2015-05-21 DIAGNOSIS — C3411 Malignant neoplasm of upper lobe, right bronchus or lung: Secondary | ICD-10-CM | POA: Diagnosis not present

## 2015-05-21 DIAGNOSIS — Z9221 Personal history of antineoplastic chemotherapy: Secondary | ICD-10-CM | POA: Insufficient documentation

## 2015-05-21 DIAGNOSIS — R918 Other nonspecific abnormal finding of lung field: Secondary | ICD-10-CM | POA: Diagnosis not present

## 2015-05-21 DIAGNOSIS — Z923 Personal history of irradiation: Secondary | ICD-10-CM | POA: Insufficient documentation

## 2015-05-21 DIAGNOSIS — R590 Localized enlarged lymph nodes: Secondary | ICD-10-CM | POA: Insufficient documentation

## 2015-05-21 DIAGNOSIS — J439 Emphysema, unspecified: Secondary | ICD-10-CM | POA: Insufficient documentation

## 2015-05-21 MED ORDER — IOHEXOL 300 MG/ML  SOLN
75.0000 mL | Freq: Once | INTRAMUSCULAR | Status: AC | PRN
  Administered 2015-05-21: 75 mL via INTRAVENOUS

## 2015-05-21 NOTE — Progress Notes (Signed)
Radiation Oncology         [phone] ________________________________  Name: Ivan Daniels MRN: [id number]  Date: 05/21/2015  DOB: 10-13-1945  Follow-Up Visit Note  CC: [name], FNP  Curt Bears, MD  Diagnosis:      ICD-9-CM ICD-10-CM   1. Primary cancer of right upper lobe of lung (HCC) 162.3 C34.11      Interval Since Last Radiation:  The patient completed radiation treatment on 12/08/201     Narrative:  The patient returns today for routine follow-up.                   Follow up  Rad txs right lung  03/01/15-04/15/15, his back dry, and has improved, stil uses sonafine cream, had Ct chest today here for results, still pending,appetite good, no nausea, has a non productive cough at times, 100% room air sats, no difficulty swallowing stated, no pain BP 111/73 mmHg  Pulse 73  Temp(Src) 97.8 F (36.6 C) (Oral)  Resp 20  Ht '5\' 11"'$  (1.803 m)  Wt 132 lb 14.4 oz (60.283 kg)  BMI 18.54 kg/m2  SpO2 100%  Wt Readings from Last 3 Encounters:  05/21/15 132 lb 14.4 oz (60.283 kg)  04/15/15 131 lb 9.6 oz (59.693 kg)  04/12/15 130 lb 1.6 oz (59.013 kg)                 ALLERGIES:  is allergic to benztropine mesylate and chlorpromazine hcl.  Meds: Current Outpatient Prescriptions  Medication Sig Dispense Refill  . benztropine (COGENTIN) 0.5 MG tablet Take 0.5 mg by mouth at bedtime.     . clopidogrel (PLAVIX) 75 MG tablet Take 75 mg by mouth daily.     . ferrous fumarate (HEMOCYTE - 106 MG FE) 325 (106 FE) MG TABS tablet Take 1 tablet by mouth daily.     . haloperidol (HALDOL) 5 MG tablet Take 5 mg by mouth at bedtime.     . Multiple Vitamins-Minerals (MULTIVITAMIN WITH MINERALS) tablet Take 1 tablet by mouth daily.    Marland Kitchen oxyCODONE (OXY IR/ROXICODONE) 5 MG immediate release tablet Take 1 tablet (5 mg total) by mouth every 3 (three) hours as needed for moderate pain. 30 tablet 0  . oxyCODONE 10 MG TABS Take 1 tablet (10 mg total) by mouth every 3 (three) hours as  needed for severe pain. 30 tablet 0  . prochlorperazine (COMPAZINE) 10 MG tablet Take 1 tablet (10 mg total) by mouth every 6 (six) hours as needed for nausea or vomiting. 30 tablet 0  . simvastatin (ZOCOR) 20 MG tablet Take 20 mg by mouth at bedtime.     . sucralfate (CARAFATE) 1 G tablet Take 1 tablet (1 g total) by mouth 4 (four) times daily. 120 tablet 2  . Wound Dressings (SONAFINE) Apply 1 application topically 3 (three) times daily.    Marland Kitchen lidocaine-prilocaine (EMLA) cream Apply 1 application topically as needed. (Patient not taking: Reported on 05/21/2015) 30 g 0   No current facility-administered medications for this encounter.    Physical Findings: The patient is in no acute distress. Patient is alert and oriented.  height is '5\' 11"'$  (1.803 m) and weight is 132 lb 14.4 oz (60.283 kg). His oral temperature is 97.8 F (36.6 C). His blood pressure is 111/73 and his pulse is 73. His respiration is 20 and oxygen saturation is 100%. .   Wheezing present in the right upper lung, otherwise clear  Lab Findings: Lab Results  Component  Value Date   WBC 5.0 05/17/2015   HGB 11.2* 05/17/2015   HCT 36.3* 05/17/2015   MCV 87.4 05/17/2015   PLT 339 05/17/2015     Radiographic Findings: Ct Chest W Contrast  05/21/2015  CLINICAL DATA:  Follow up right upper lobe lung cancer. Radiation therapy completed one month ago. Chemotherapy ongoing. EXAM: CT CHEST WITH CONTRAST TECHNIQUE: Multidetector CT imaging of the chest was performed during intravenous contrast administration. CONTRAST:  42m OMNIPAQUE IOHEXOL 300 MG/ML  SOLN COMPARISON:  Head CT 02/08/2015.  Chest CT 01/25/2015. FINDINGS: Mediastinum/Nodes: Interval improvement in the previously demonstrated mediastinal and right hilar adenopathy. Precarinal node measures 9 mm short axis on image 28 (previously 20 mm). Ill-defined right hilar mass has also improved, measuring 1.8 x 1.7 cm on image 27. The 1.4 cm nodule in the pre-vascular space is  stable, not hypermetabolic on prior PET-CT. No new or enlarging lymph nodes identified. The thyroid gland and trachea demonstrate no significant findings. There is wall thickening of the mid to distal esophagus, probably related to in radiation therapy. The heart size is normal. There is no pericardial effusion. No significant vascular findings. Right IJ Port-A-Cath tip is at the SVC right atrial junction) Lungs/Pleura: Minimal pleural thickening on the right. No significant pleural effusion. The dominant right upper lobe perihilar mass has decreased in size, although is ill-defined and irregular in shape. It measures approximately 3.4 x 2.3 cm on image 27 (previously 3.7 x 3.6 cm). There is persistent mass effect on the right upper lobe bronchus which is narrowed. There is increased airspace disease extending peripherally from the dominant mass, likely related to radiation change or postobstructive pneumonitis. The previously identified right apical nodule appears slightly smaller (image 14). There is a more focal 8 mm subpleural nodule posteriorly in the right upper lobe, best seen on sagittal image number 30. There is mild patchy airspace opacity posteriorly in the right middle lobe, best seen on the sagittal images, likely treatment related as well. The left lung is clear. Emphysema and biapical blebs are noted. Upper abdomen: The visualized liver appears stable. There is no evidence of adrenal mass. The right kidney is malrotated. Musculoskeletal/Chest wall: There is no chest wall mass or suspicious osseous finding. IMPRESSION: 1. Interval improvement in right hilar mass, mediastinal and right hilar adenopathy following treatment. 2. There is increased volume loss in the right upper lobe with increased airspace opacities surrounding the mass, likely radiation or postobstructive pneumonitis. Although some components are mildly nodular, no findings highly worrisome for disease progression. 3. Underlying  emphysema. 4. Probable radiation esophagitis. Electronically Signed   By: WRichardean SaleM.D.   On: 05/21/2015 15:26    Impression:    The patient is doing satisfactorily at this time without any ongoing issues in terms of significant shortness of breath, esophagitis. He had a CT scan earlier today with the results pending. He is scheduled to discuss this with Dr. MJulien Nordmannnext week.  Plan:  The patient will follow-up in our clinic in 6 months.   JJodelle Gross M.D., Ph.D.

## 2015-05-21 NOTE — Progress Notes (Signed)
Follow up  Rad txs right lung  03/01/15-04/15/15, his back dry, and has improved, stil uses sonafine cream, had Ct chest today here for results, still pending,appetite good, no nausea, has a non productive cough at times, 100% room air sats, no difficulty swallowing stated, no pain BP 111/73 mmHg  Pulse 73  Temp(Src) 97.8 F (36.6 C) (Oral)  Resp 20  Ht '5\' 11"'$  (1.803 m)  Wt 132 lb 14.4 oz (60.283 kg)  BMI 18.54 kg/m2  SpO2 100%  Wt Readings from Last 3 Encounters:  05/21/15 132 lb 14.4 oz (60.283 kg)  04/15/15 131 lb 9.6 oz (59.693 kg)  04/12/15 130 lb 1.6 oz (59.013 kg)

## 2015-05-24 ENCOUNTER — Telehealth: Payer: Self-pay | Admitting: Internal Medicine

## 2015-05-24 ENCOUNTER — Ambulatory Visit (HOSPITAL_BASED_OUTPATIENT_CLINIC_OR_DEPARTMENT_OTHER): Payer: Medicare Other | Admitting: Internal Medicine

## 2015-05-24 ENCOUNTER — Other Ambulatory Visit: Payer: Self-pay | Admitting: Pharmacist

## 2015-05-24 ENCOUNTER — Encounter: Payer: Self-pay | Admitting: Internal Medicine

## 2015-05-24 ENCOUNTER — Other Ambulatory Visit: Payer: Medicare Other

## 2015-05-24 VITALS — BP 119/74 | HR 109 | Temp 98.5°F | Resp 18 | Ht 71.0 in | Wt 132.4 lb

## 2015-05-24 DIAGNOSIS — Z5111 Encounter for antineoplastic chemotherapy: Secondary | ICD-10-CM

## 2015-05-24 DIAGNOSIS — C3411 Malignant neoplasm of upper lobe, right bronchus or lung: Secondary | ICD-10-CM | POA: Diagnosis present

## 2015-05-24 NOTE — Telephone Encounter (Signed)
per pof to sch pt appt-sent MW email to sch-will call pt after reply

## 2015-05-24 NOTE — Progress Notes (Signed)
Changed pre-meds IV to Aloxi + Dex 10 per NCCN.  Protocol updated. Kennith Center, Pharm.D., CPP 05/24/2015'@5'$ :22 PM

## 2015-05-24 NOTE — Progress Notes (Signed)
[facility] [phone]   [phone]) [phone]  OFFICE PROGRESS NOTE  [name], FNP [address] [place] [address]  DIAGNOSIS: Stage IIIA (T2a, N2, M0) non-small cell lung cancer consistent with invasive squamous cell carcinoma presented with right upper lobe lung mass in addition to right hilar and mediastinal lymphadenopathy diagnosed in September 2016.  PRIOR THERAPY: Course of concurrent chemoradiation with weekly carboplatin for AUC of 2 and paclitaxel 45 MG/M2. First dose 03/01/2015. He is status post 6 cycles. Last cycle was given 04/12/2015 was partial response.   CURRENT THERAPY:   INTERVAL HISTORY: Ivan Daniels 70 y.o. male returns to the clinic today for follow-up visit accompanied by his sister. The patient has been tolerating his course of concurrent chemoradiation fairly well except for radiation induced esophagitis and dry cough. He is also on Carafate 1 g by mouth 4 times a day. The patient also has Percocet for pain management. His last fraction of radiation is scheduled for 04/15/2015. He denied having any significant fever or chills. He has no nausea or vomiting. He denied having any significant chest pain, shortness of breath, cough or hemoptysis. He is here today to start cycle #6 of his chemotherapy.  MEDICAL HISTORY: Past Medical History  Diagnosis Date  . Hyperlipidemia   . TOBACCO ABUSE 05/24/2009    Qualifier: Diagnosis of  By: Hassell Done FNP, Tori Milks    . Paranoid schizophrenia ([place]) 10/31/2009    Qualifier: Diagnosis of  By: Jorene Minors, [place]    . BENIGN PROSTATIC HYPERTROPHY, HX OF 12/19/2006    Qualifier: Diagnosis of  By: Radene Ou MD, [place]    . Primary spontaneous pneumothorax, left 01/25/2015  . Lung mass 01/25/2015    3.7 x 3.6 cm RUL spiculated lung mass with 2.0 cm right paratracheal lymph node suspicious for bronchogenic CA  . Full code status 02/10/2015  . Allergy   . ALCOHOL ABUSE 11/12/2009   Qualifier: Diagnosis of  By: Hassell Done FNP, Tori Milks    . Non-small cell lung cancer ([place]) 01/27/15    non small-cell,squamous cell ca RUL    ALLERGIES:  is allergic to benztropine mesylate and chlorpromazine hcl.  MEDICATIONS:  Current Outpatient Prescriptions  Medication Sig Dispense Refill  . benztropine (COGENTIN) 0.5 MG tablet Take 0.5 mg by mouth at bedtime.     . clopidogrel (PLAVIX) 75 MG tablet Take 75 mg by mouth daily.     . ferrous fumarate (HEMOCYTE - 106 MG FE) 325 (106 FE) MG TABS tablet Take 1 tablet by mouth daily.     . haloperidol (HALDOL) 5 MG tablet Take 5 mg by mouth at bedtime.     . lidocaine-prilocaine (EMLA) cream Apply 1 application topically as needed. (Patient not taking: Reported on 05/21/2015) 30 g 0  . Multiple Vitamins-Minerals (MULTIVITAMIN WITH MINERALS) tablet Take 1 tablet by mouth daily.    Marland Kitchen oxyCODONE (OXY IR/ROXICODONE) 5 MG immediate release tablet Take 1 tablet (5 mg total) by mouth every 3 (three) hours as needed for moderate pain. 30 tablet 0  . oxyCODONE 10 MG TABS Take 1 tablet (10 mg total) by mouth every 3 (three) hours as needed for severe pain. 30 tablet 0  . prochlorperazine (COMPAZINE) 10 MG tablet Take 1 tablet (10 mg total) by mouth every 6 (six) hours as needed for nausea or vomiting. 30 tablet 0  . simvastatin (ZOCOR) 20 MG tablet Take 20 mg by mouth at bedtime.     Marland Kitchen  sucralfate (CARAFATE) 1 G tablet Take 1 tablet (1 g total) by mouth 4 (four) times daily. 120 tablet 2  . Wound Dressings (SONAFINE) Apply 1 application topically 3 (three) times daily.     No current facility-administered medications for this visit.    SURGICAL HISTORY:  Past Surgical History  Procedure Laterality Date  . Video bronchoscopy with endobronchial ultrasound N/A 01/27/2015    Procedure: VIDEO BRONCHOSCOPY WITH ENDOBRONCHIAL ULTRASOUND;  Surgeon: Grace Isaac, MD;  Location: [facility] OR;  Service: Thoracic;  Laterality: N/A;  . Lung biopsy Right 01/27/2015     Procedure: Right Upper Lobe Bronchus BIOPSY;  Surgeon: Grace Isaac, MD;  Location: [facility];  Service: Thoracic;  Laterality: Right;    REVIEW OF SYSTEMS:  A comprehensive review of systems was negative except for: Constitutional: positive for fatigue Gastrointestinal: positive for dyspepsia, dysphagia and odynophagia   PHYSICAL EXAMINATION: General appearance: alert, cooperative and no distress Head: Normocephalic, without obvious abnormality, atraumatic Neck: no adenopathy, no JVD, supple, symmetrical, trachea midline and thyroid not enlarged, symmetric, no tenderness/mass/nodules Lymph nodes: Cervical, supraclavicular, and axillary nodes normal. Resp: clear to auscultation bilaterally Back: symmetric, no curvature. ROM normal. No CVA tenderness. Cardio: regular rate and rhythm, S1, S2 normal, no murmur, click, rub or gallop GI: soft, non-tender; bowel sounds normal; no masses,  no organomegaly Extremities: extremities normal, atraumatic, no cyanosis or edema  ECOG PERFORMANCE STATUS: 1 - Symptomatic but completely ambulatory  Blood pressure 119/74, pulse 109, temperature 98.5 F (36.9 C), resp. rate 18, height '5\' 11"'$  (1.803 m), weight 132 lb 6.4 oz (60.056 kg), SpO2 100 %.  LABORATORY DATA: Lab Results  Component Value Date   WBC 5.0 05/17/2015   HGB 11.2* 05/17/2015   HCT 36.3* 05/17/2015   MCV 87.4 05/17/2015   PLT 339 05/17/2015      Chemistry      Component Value Date/Time   NA 141 05/17/2015 1058   NA 138 01/26/2015 1454   K 3.5 05/17/2015 1058   K 3.7 01/26/2015 1454   CL 101 01/26/2015 1454   CO2 29 05/17/2015 1058   CO2 28 01/26/2015 1454   BUN 7.4 05/17/2015 1058   BUN 9 01/26/2015 1454   CREATININE 1.1 05/17/2015 1058   CREATININE 0.95 01/26/2015 1454      Component Value Date/Time   CALCIUM 10.0 05/17/2015 1058   CALCIUM 9.1 01/26/2015 1454   ALKPHOS 60 05/17/2015 1058   ALKPHOS 52 01/26/2015 1454   AST 18 05/17/2015 1058   AST 21 01/26/2015 1454     ALT 11 05/17/2015 1058   ALT 11* 01/26/2015 1454   BILITOT <0.30 05/17/2015 1058   BILITOT <0.1* 01/26/2015 1454       RADIOGRAPHIC STUDIES: Ct Chest W Contrast  05/21/2015  CLINICAL DATA:  Follow up right upper lobe lung cancer. Radiation therapy completed one month ago. Chemotherapy ongoing. EXAM: CT CHEST WITH CONTRAST TECHNIQUE: Multidetector CT imaging of the chest was performed during intravenous contrast administration. CONTRAST:  83m OMNIPAQUE IOHEXOL 300 MG/ML  SOLN COMPARISON:  Head CT 02/08/2015.  Chest CT 01/25/2015. FINDINGS: Mediastinum/Nodes: Interval improvement in the previously demonstrated mediastinal and right hilar adenopathy. Precarinal node measures 9 mm short axis on image 28 (previously 20 mm). Ill-defined right hilar mass has also improved, measuring 1.8 x 1.7 cm on image 27. The 1.4 cm nodule in the pre-vascular space is stable, not hypermetabolic on prior PET-CT. No new or enlarging lymph nodes identified. The thyroid gland and trachea demonstrate no  significant findings. There is wall thickening of the mid to distal esophagus, probably related to in radiation therapy. The heart size is normal. There is no pericardial effusion. No significant vascular findings. Right IJ Port-A-Cath tip is at the SVC right atrial junction) Lungs/Pleura: Minimal pleural thickening on the right. No significant pleural effusion. The dominant right upper lobe perihilar mass has decreased in size, although is ill-defined and irregular in shape. It measures approximately 3.4 x 2.3 cm on image 27 (previously 3.7 x 3.6 cm). There is persistent mass effect on the right upper lobe bronchus which is narrowed. There is increased airspace disease extending peripherally from the dominant mass, likely related to radiation change or postobstructive pneumonitis. The previously identified right apical nodule appears slightly smaller (image 14). There is a more focal 8 mm subpleural nodule posteriorly in the  right upper lobe, best seen on sagittal image number 30. There is mild patchy airspace opacity posteriorly in the right middle lobe, best seen on the sagittal images, likely treatment related as well. The left lung is clear. Emphysema and biapical blebs are noted. Upper abdomen: The visualized liver appears stable. There is no evidence of adrenal mass. The right kidney is malrotated. Musculoskeletal/Chest wall: There is no chest wall mass or suspicious osseous finding. IMPRESSION: 1. Interval improvement in right hilar mass, mediastinal and right hilar adenopathy following treatment. 2. There is increased volume loss in the right upper lobe with increased airspace opacities surrounding the mass, likely radiation or postobstructive pneumonitis. Although some components are mildly nodular, no findings highly worrisome for disease progression. 3. Underlying emphysema. 4. Probable radiation esophagitis. Electronically Signed   By: Richardean Sale M.D.   On: 05/21/2015 15:26    ASSESSMENT AND PLAN: This is a very pleasant 70 years old African-American male with a stage IIIA non-small cell lung cancer and started on concurrent chemoradiation with weekly carboplatin and paclitaxel. Status post 6 week of treatment. He tolerated his treatment well. He has been off treatment for the last few weeks. The recent CT scan of the chest showed partial improvement of his disease. I discussed the scan results with the patient and his sister today. I gave him the option of continuous observation and close monitoring versus proceeding with 3 cycles of consolidation chemotherapy with reduced dose carboplatin for AUC of 5 and paclitaxel 175 MG/M2 every 3 weeks with Neulasta support. The patient is interested in proceeding with the treatment. I discussed with him the adverse effect of this treatment including but not limited to alopecia, myelosuppression, nausea and vomiting, peripheral neuropathy, liver or renal dysfunction. He is  expected to start the first cycle of this treatment on 06/02/2015. The patient would come back for follow-up visit in 4 weeks for reevaluation before starting cycle #2. The patient was advised to call immediately if he has any concerning symptoms in the interval. The patient voices understanding of current disease status and treatment options and is in agreement with the current care plan.  All questions were answered. The patient knows to call the clinic with any problems, questions or concerns. We can certainly see the patient much sooner if necessary.  Disclaimer: This note was dictated with voice recognition software. Similar sounding words can inadvertently be transcribed and may not be corrected upon review.

## 2015-05-28 ENCOUNTER — Telehealth: Payer: Self-pay | Admitting: Internal Medicine

## 2015-05-28 NOTE — Telephone Encounter (Signed)
per pof to sch pt trmt-per reply cld pt and left message of time & date if appt for 1/25 '@8'$ :15

## 2015-06-02 ENCOUNTER — Other Ambulatory Visit: Payer: Medicare Other

## 2015-06-02 ENCOUNTER — Other Ambulatory Visit (HOSPITAL_BASED_OUTPATIENT_CLINIC_OR_DEPARTMENT_OTHER): Payer: Medicare Other

## 2015-06-02 ENCOUNTER — Ambulatory Visit (HOSPITAL_BASED_OUTPATIENT_CLINIC_OR_DEPARTMENT_OTHER): Payer: Medicare Other

## 2015-06-02 VITALS — BP 126/61 | HR 100 | Temp 98.8°F | Resp 20

## 2015-06-02 DIAGNOSIS — C3491 Malignant neoplasm of unspecified part of right bronchus or lung: Secondary | ICD-10-CM

## 2015-06-02 DIAGNOSIS — C3411 Malignant neoplasm of upper lobe, right bronchus or lung: Secondary | ICD-10-CM

## 2015-06-02 DIAGNOSIS — Z5111 Encounter for antineoplastic chemotherapy: Secondary | ICD-10-CM | POA: Diagnosis not present

## 2015-06-02 LAB — COMPREHENSIVE METABOLIC PANEL
ALT: 10 U/L (ref 0–55)
AST: 19 U/L (ref 5–34)
Albumin: 3.6 g/dL (ref 3.5–5.0)
Alkaline Phosphatase: 48 U/L (ref 40–150)
Anion Gap: 8 mEq/L (ref 3–11)
BUN: 11.7 mg/dL (ref 7.0–26.0)
CO2: 29 mEq/L (ref 22–29)
Calcium: 9.2 mg/dL (ref 8.4–10.4)
Chloride: 105 mEq/L (ref 98–109)
Creatinine: 1.1 mg/dL (ref 0.7–1.3)
EGFR: 76 mL/min/{1.73_m2} — ABNORMAL LOW (ref >90)
Glucose: 107 mg/dl (ref 70–140)
Potassium: 3.5 mEq/L (ref 3.5–5.1)
Sodium: 141 mEq/L (ref 136–145)
Total Bilirubin: <0.3 mg/dL (ref 0.20–1.20)
Total Protein: 7.2 g/dL (ref 6.4–8.3)

## 2015-06-02 LAB — CBC WITH DIFFERENTIAL/PLATELET
BASO%: 0.5 % (ref 0.0–2.0)
Basophils Absolute: 0 10*3/uL (ref 0.0–0.1)
EOS%: 4.6 % (ref 0.0–7.0)
Eosinophils Absolute: 0.2 10*3/uL (ref 0.0–0.5)
HCT: 28.1 % — ABNORMAL LOW (ref 38.4–49.9)
HGB: 8.8 g/dL — ABNORMAL LOW (ref 13.0–17.1)
LYMPH%: 21.2 % (ref 14.0–49.0)
MCH: 27.4 pg (ref 27.2–33.4)
MCHC: 31.4 g/dL — ABNORMAL LOW (ref 32.0–36.0)
MCV: 87.5 fL (ref 79.3–98.0)
MONO#: 0.6 10*3/uL (ref 0.1–0.9)
MONO%: 12.1 % (ref 0.0–14.0)
NEUT#: 3 10*3/uL (ref 1.5–6.5)
NEUT%: 61.6 % (ref 39.0–75.0)
Platelets: 217 10*3/uL (ref 140–400)
RBC: 3.21 10*6/uL — ABNORMAL LOW (ref 4.20–5.82)
RDW: 20.4 % — ABNORMAL HIGH (ref 11.0–14.6)
WBC: 4.9 10*3/uL (ref 4.0–10.3)
lymph#: 1 10*3/uL (ref 0.9–3.3)

## 2015-06-02 MED ORDER — SODIUM CHLORIDE 0.9 % IJ SOLN
10.0000 mL | INTRAMUSCULAR | Status: DC | PRN
  Administered 2015-06-02: 10 mL
  Filled 2015-06-02: qty 10

## 2015-06-02 MED ORDER — DIPHENHYDRAMINE HCL 25 MG PO CAPS
ORAL_CAPSULE | ORAL | Status: AC
  Filled 2015-06-02: qty 2

## 2015-06-02 MED ORDER — SODIUM CHLORIDE 0.9 % IV SOLN
Freq: Once | INTRAVENOUS | Status: AC
  Administered 2015-06-02: 09:00:00 via INTRAVENOUS

## 2015-06-02 MED ORDER — DIPHENHYDRAMINE HCL 50 MG/ML IJ SOLN
INTRAMUSCULAR | Status: AC
  Filled 2015-06-02: qty 1

## 2015-06-02 MED ORDER — PALONOSETRON HCL INJECTION 0.25 MG/5ML
INTRAVENOUS | Status: AC
Start: 2015-06-02 — End: 2015-06-02
  Filled 2015-06-02: qty 5

## 2015-06-02 MED ORDER — PALONOSETRON HCL INJECTION 0.25 MG/5ML
0.2500 mg | Freq: Once | INTRAVENOUS | Status: AC
  Administered 2015-06-02: 0.25 mg via INTRAVENOUS

## 2015-06-02 MED ORDER — CARBOPLATIN CHEMO INJECTION 600 MG/60ML
398.0000 mg | Freq: Once | INTRAVENOUS | Status: AC
  Administered 2015-06-02: 400 mg via INTRAVENOUS
  Filled 2015-06-02: qty 40

## 2015-06-02 MED ORDER — FAMOTIDINE IN NACL 20-0.9 MG/50ML-% IV SOLN
INTRAVENOUS | Status: AC
  Filled 2015-06-02: qty 50

## 2015-06-02 MED ORDER — PACLITAXEL CHEMO INJECTION 300 MG/50ML
175.0000 mg/m2 | Freq: Once | INTRAVENOUS | Status: AC
  Administered 2015-06-02: 306 mg via INTRAVENOUS
  Filled 2015-06-02: qty 51

## 2015-06-02 MED ORDER — DIPHENHYDRAMINE HCL 50 MG/ML IJ SOLN
50.0000 mg | Freq: Once | INTRAMUSCULAR | Status: AC
  Administered 2015-06-02: 50 mg via INTRAVENOUS

## 2015-06-02 MED ORDER — HEPARIN SOD (PORK) LOCK FLUSH 100 UNIT/ML IV SOLN
500.0000 [IU] | Freq: Once | INTRAVENOUS | Status: AC | PRN
  Administered 2015-06-02: 500 [IU]
  Filled 2015-06-02: qty 5

## 2015-06-02 MED ORDER — SODIUM CHLORIDE 0.9 % IV SOLN
10.0000 mg | Freq: Once | INTRAVENOUS | Status: AC
  Administered 2015-06-02: 10 mg via INTRAVENOUS
  Filled 2015-06-02: qty 1

## 2015-06-02 MED ORDER — FAMOTIDINE IN NACL 20-0.9 MG/50ML-% IV SOLN
20.0000 mg | Freq: Once | INTRAVENOUS | Status: AC
  Administered 2015-06-02: 20 mg via INTRAVENOUS

## 2015-06-02 NOTE — Patient Instructions (Signed)
[facility] Discharge Instructions for Patients Receiving Chemotherapy  Today you received the following chemotherapy agents:  Carboplatin, Taxol  To help prevent nausea and vomiting after your treatment, we encourage you to take your nausea medication as prescribed.   If you develop nausea and vomiting that is not controlled by your nausea medication, call the clinic.   BELOW ARE SYMPTOMS THAT SHOULD BE REPORTED IMMEDIATELY:  *FEVER GREATER THAN 100.5 F  *CHILLS WITH OR WITHOUT FEVER  NAUSEA AND VOMITING THAT IS NOT CONTROLLED WITH YOUR NAUSEA MEDICATION  *UNUSUAL SHORTNESS OF BREATH  *UNUSUAL BRUISING OR BLEEDING  TENDERNESS IN MOUTH AND THROAT WITH OR WITHOUT PRESENCE OF ULCERS  *URINARY PROBLEMS  *BOWEL PROBLEMS  UNUSUAL RASH Items with * indicate a potential emergency and should be followed up as soon as possible.  Feel free to call the clinic you have any questions or concerns. The clinic phone number is [phone].  Please show the CHEMO ALERT CARD at check-in to the Emergency Department and triage nurse.   

## 2015-06-03 ENCOUNTER — Encounter: Payer: Self-pay | Admitting: *Deleted

## 2015-06-03 NOTE — Progress Notes (Signed)
[facility] Social Work  Clinical Social Work was referred by Therapist, sports to review and complete healthcare advance directives.  Clinical Social Worker met with patient and patient's spouse in [place] office.  The patient designated sister Jetty Duhamel as their primary healthcare agent and daughter Carlyle Lipa as their secondary agent.  Patient also completed healthcare living will and patient does not desire life prolonging measures in the scenarios indicated in the living will.    Clinical Social Worker notarized documents and made copies for patient/family. Clinical Social Worker will send documents to medical records to be scanned into patient's chart. Clinical Social Worker encouraged patient/family to contact with any additional questions or concerns.  Polo Riley, MSW, [place] Worker [facility] [phone]

## 2015-06-04 ENCOUNTER — Ambulatory Visit (HOSPITAL_BASED_OUTPATIENT_CLINIC_OR_DEPARTMENT_OTHER): Payer: Medicare Other

## 2015-06-04 VITALS — BP 103/63 | HR 114 | Temp 98.6°F

## 2015-06-04 DIAGNOSIS — C3411 Malignant neoplasm of upper lobe, right bronchus or lung: Secondary | ICD-10-CM

## 2015-06-04 DIAGNOSIS — Z5189 Encounter for other specified aftercare: Secondary | ICD-10-CM | POA: Diagnosis not present

## 2015-06-04 DIAGNOSIS — C3491 Malignant neoplasm of unspecified part of right bronchus or lung: Secondary | ICD-10-CM

## 2015-06-04 MED ORDER — PEGFILGRASTIM INJECTION 6 MG/0.6ML ~~LOC~~
6.0000 mg | PREFILLED_SYRINGE | Freq: Once | SUBCUTANEOUS | Status: AC
  Administered 2015-06-04: 6 mg via SUBCUTANEOUS
  Filled 2015-06-04: qty 0.6

## 2015-06-04 NOTE — Patient Instructions (Signed)
Pegfilgrastim injection What is this medicine? PEGFILGRASTIM (PEG fil gra stim) is a long-acting granulocyte colony-stimulating factor that stimulates the growth of neutrophils, a type of white blood cell important in the body's fight against infection. It is used to reduce the incidence of fever and infection in patients with certain types of cancer who are receiving chemotherapy that affects the bone marrow, and to increase survival after being exposed to high doses of radiation. This medicine may be used for other purposes; ask your health care provider or pharmacist if you have questions. What should I tell my health care provider before I take this medicine? They need to know if you have any of these conditions: -kidney disease -latex allergy -ongoing radiation therapy -sickle cell disease -skin reactions to acrylic adhesives (On-Body Injector only) -an unusual or allergic reaction to pegfilgrastim, filgrastim, other medicines, foods, dyes, or preservatives -pregnant or trying to get pregnant -breast-feeding How should I use this medicine? This medicine is for injection under the skin. If you get this medicine at home, you will be taught how to prepare and give the pre-filled syringe or how to use the On-body Injector. Refer to the patient Instructions for Use for detailed instructions. Use exactly as directed. Take your medicine at regular intervals. Do not take your medicine more often than directed. It is important that you put your used needles and syringes in a special sharps container. Do not put them in a trash can. If you do not have a sharps container, call your pharmacist or healthcare provider to get one. Talk to your pediatrician regarding the use of this medicine in children. While this drug may be prescribed for selected conditions, precautions do apply. Overdosage: If you think you have taken too much of this medicine contact a poison control center or emergency room at  once. NOTE: This medicine is only for you. Do not share this medicine with others. What if I miss a dose? It is important not to miss your dose. Call your doctor or health care professional if you miss your dose. If you miss a dose due to an On-body Injector failure or leakage, a new dose should be administered as soon as possible using a single prefilled syringe for manual use. What may interact with this medicine? Interactions have not been studied. Give your health care provider a list of all the medicines, herbs, non-prescription drugs, or dietary supplements you use. Also tell them if you smoke, drink alcohol, or use illegal drugs. Some items may interact with your medicine. This list may not describe all possible interactions. Give your health care provider a list of all the medicines, herbs, non-prescription drugs, or dietary supplements you use. Also tell them if you smoke, drink alcohol, or use illegal drugs. Some items may interact with your medicine. What should I watch for while using this medicine? You may need blood work done while you are taking this medicine. If you are going to need a MRI, CT scan, or other procedure, tell your doctor that you are using this medicine (On-Body Injector only). What side effects may I notice from receiving this medicine? Side effects that you should report to your doctor or health care professional as soon as possible: -allergic reactions like skin rash, itching or hives, swelling of the face, lips, or tongue -dizziness -fever -pain, redness, or irritation at site where injected -pinpoint red spots on the skin -red or dark-brown urine -shortness of breath or breathing problems -stomach or side pain, or pain   at the shoulder -swelling -tiredness -trouble passing urine or change in the amount of urine Side effects that usually do not require medical attention (report to your doctor or health care professional if they continue or are  bothersome): -bone pain -muscle pain This list may not describe all possible side effects. Call your doctor for medical advice about side effects. You may report side effects to FDA at 1-800-FDA-1088. Where should I keep my medicine? Keep out of the reach of children. Store pre-filled syringes in a refrigerator between 2 and 8 degrees C (36 and 46 degrees F). Do not freeze. Keep in carton to protect from light. Throw away this medicine if it is left out of the refrigerator for more than 48 hours. Throw away any unused medicine after the expiration date. NOTE: This sheet is a summary. It may not cover all possible information. If you have questions about this medicine, talk to your doctor, pharmacist, or health care provider.    2016, Elsevier/Gold Standard. (2014-05-14 14:30:14)  

## 2015-06-09 ENCOUNTER — Ambulatory Visit (HOSPITAL_COMMUNITY)
Admission: RE | Admit: 2015-06-09 | Discharge: 2015-06-09 | Disposition: A | Discharge status: Home / Self Care | Payer: Medicare Other | Source: Ambulatory Visit | Attending: Internal Medicine | Admitting: Internal Medicine

## 2015-06-09 ENCOUNTER — Other Ambulatory Visit (HOSPITAL_BASED_OUTPATIENT_CLINIC_OR_DEPARTMENT_OTHER): Payer: Medicare Other

## 2015-06-09 ENCOUNTER — Other Ambulatory Visit: Payer: Self-pay | Admitting: Medical Oncology

## 2015-06-09 ENCOUNTER — Telehealth: Payer: Self-pay | Admitting: Internal Medicine

## 2015-06-09 ENCOUNTER — Ambulatory Visit: Payer: Medicare Other

## 2015-06-09 VITALS — BP 147/70 | HR 108 | Temp 99.2°F | Resp 16

## 2015-06-09 DIAGNOSIS — D702 Other drug-induced agranulocytosis: Secondary | ICD-10-CM

## 2015-06-09 DIAGNOSIS — C3491 Malignant neoplasm of unspecified part of right bronchus or lung: Secondary | ICD-10-CM

## 2015-06-09 DIAGNOSIS — C3411 Malignant neoplasm of upper lobe, right bronchus or lung: Secondary | ICD-10-CM

## 2015-06-09 DIAGNOSIS — D649 Anemia, unspecified: Secondary | ICD-10-CM

## 2015-06-09 LAB — CBC WITH DIFFERENTIAL/PLATELET
BASO%: 0.7 % (ref 0.0–2.0)
Basophils Absolute: 0 10*3/uL (ref 0.0–0.1)
EOS%: 4.5 % (ref 0.0–7.0)
Eosinophils Absolute: 0 10*3/uL (ref 0.0–0.5)
HCT: 21.9 % — ABNORMAL LOW (ref 38.4–49.9)
HGB: 7 g/dL — ABNORMAL LOW (ref 13.0–17.1)
LYMPH%: 52.8 % — ABNORMAL HIGH (ref 14.0–49.0)
MCH: 27.8 pg (ref 27.2–33.4)
MCHC: 32 g/dL (ref 32.0–36.0)
MCV: 87 fL (ref 79.3–98.0)
MONO#: 0.2 10*3/uL (ref 0.1–0.9)
MONO%: 20.3 % — ABNORMAL HIGH (ref 0.0–14.0)
NEUT#: 0.2 10*3/uL — CL (ref 1.5–6.5)
NEUT%: 21.7 % — ABNORMAL LOW (ref 39.0–75.0)
Platelets: 107 10*3/uL — ABNORMAL LOW (ref 140–400)
RBC: 2.52 10*6/uL — ABNORMAL LOW (ref 4.20–5.82)
RDW: 20 % — ABNORMAL HIGH (ref 11.0–14.6)
WBC: 0.8 10*3/uL — CL (ref 4.0–10.3)
lymph#: 0.4 10*3/uL — ABNORMAL LOW (ref 0.9–3.3)

## 2015-06-09 LAB — COMPREHENSIVE METABOLIC PANEL
ALT: 23 U/L (ref 0–55)
AST: 20 U/L (ref 5–34)
Albumin: 3.3 g/dL — ABNORMAL LOW (ref 3.5–5.0)
Alkaline Phosphatase: 42 U/L (ref 40–150)
Anion Gap: 9 mEq/L (ref 3–11)
BUN: 8.6 mg/dL (ref 7.0–26.0)
CO2: 28 mEq/L (ref 22–29)
Calcium: 9.3 mg/dL (ref 8.4–10.4)
Chloride: 101 mEq/L (ref 98–109)
Creatinine: 0.9 mg/dL (ref 0.7–1.3)
EGFR: >90 mL/min/{1.73_m2} (ref >90)
Glucose: 101 mg/dl (ref 70–140)
Potassium: 3.8 mEq/L (ref 3.5–5.1)
Sodium: 138 mEq/L (ref 136–145)
Total Bilirubin: <0.3 mg/dL (ref 0.20–1.20)
Total Protein: 6.8 g/dL (ref 6.4–8.3)

## 2015-06-09 LAB — PREPARE RBC (CROSSMATCH)

## 2015-06-09 LAB — ABO/RH: ABO/RH(D): A POS

## 2015-06-09 MED ORDER — HEPARIN SOD (PORK) LOCK FLUSH 100 UNIT/ML IV SOLN
500.0000 [IU] | Freq: Every day | INTRAVENOUS | Status: AC | PRN
  Administered 2015-06-09: 500 [IU]
  Filled 2015-06-09: qty 5

## 2015-06-09 MED ORDER — SODIUM CHLORIDE 0.9 % IV SOLN
250.0000 mL | Freq: Once | INTRAVENOUS | Status: AC
  Administered 2015-06-09: 500 mL via INTRAVENOUS

## 2015-06-09 MED ORDER — CIPROFLOXACIN HCL 500 MG PO TABS
500.0000 mg | ORAL_TABLET | Freq: Two times a day (BID) | ORAL | Status: AC
Start: 2015-06-09 — End: 2015-06-13

## 2015-06-09 MED ORDER — SODIUM CHLORIDE 0.9% FLUSH
10.0000 mL | INTRAVENOUS | Status: AC | PRN
  Administered 2015-06-09: 10 mL

## 2015-06-09 MED ORDER — DIPHENHYDRAMINE HCL 25 MG PO CAPS
25.0000 mg | ORAL_CAPSULE | Freq: Once | ORAL | Status: AC
  Administered 2015-06-09: 25 mg via ORAL
  Filled 2015-06-09: qty 1

## 2015-06-09 MED ORDER — ACETAMINOPHEN 325 MG PO TABS
650.0000 mg | ORAL_TABLET | Freq: Once | ORAL | Status: AC
  Administered 2015-06-09: 650 mg via ORAL
  Filled 2015-06-09: qty 2

## 2015-06-09 NOTE — Progress Notes (Signed)
Pt sister and pt notifed of plan of care for neutropenia and anemia neutropenic precautions reviewed and when to call. cipro sent to local pharm.

## 2015-06-09 NOTE — Progress Notes (Signed)
Diagnosis: Symptomatic Anemia  MD: Curt Bears   Procedure: 2 units PRBC infused without reaction. Flushed port a cath and Deaccessed port a cath and patient understands discharge instructions. Accompanied by family member. Discharged to home.

## 2015-06-09 NOTE — Progress Notes (Signed)
HAR done

## 2015-06-09 NOTE — Telephone Encounter (Signed)
per pof to sch pt blood tras-per inf to sch '@sickle'$  cell-gave pt time/date/location

## 2015-06-10 LAB — TYPE AND SCREEN
ABO/RH(D): A POS
Antibody Screen: NEGATIVE
Unit division: 0
Unit division: 0

## 2015-06-16 ENCOUNTER — Other Ambulatory Visit (HOSPITAL_BASED_OUTPATIENT_CLINIC_OR_DEPARTMENT_OTHER): Payer: Medicare Other

## 2015-06-16 DIAGNOSIS — C3411 Malignant neoplasm of upper lobe, right bronchus or lung: Secondary | ICD-10-CM

## 2015-06-16 DIAGNOSIS — C3491 Malignant neoplasm of unspecified part of right bronchus or lung: Secondary | ICD-10-CM

## 2015-06-16 LAB — COMPREHENSIVE METABOLIC PANEL
ALT: 13 U/L (ref 0–55)
AST: 16 U/L (ref 5–34)
Albumin: 3.3 g/dL — ABNORMAL LOW (ref 3.5–5.0)
Alkaline Phosphatase: 74 U/L (ref 40–150)
Anion Gap: 10 mEq/L (ref 3–11)
BUN: 6.5 mg/dL — ABNORMAL LOW (ref 7.0–26.0)
CO2: 27 mEq/L (ref 22–29)
Calcium: 9.1 mg/dL (ref 8.4–10.4)
Chloride: 106 mEq/L (ref 98–109)
Creatinine: 1 mg/dL (ref 0.7–1.3)
EGFR: >90 mL/min/{1.73_m2} (ref >90)
Glucose: 110 mg/dl (ref 70–140)
Potassium: 3.9 mEq/L (ref 3.5–5.1)
Sodium: 143 mEq/L (ref 136–145)
Total Bilirubin: <0.3 mg/dL (ref 0.20–1.20)
Total Protein: 7.2 g/dL (ref 6.4–8.3)

## 2015-06-16 LAB — CBC WITH DIFFERENTIAL/PLATELET
BASO%: 0.1 % (ref 0.0–2.0)
Basophils Absolute: 0 10*3/uL (ref 0.0–0.1)
EOS%: 0 % (ref 0.0–7.0)
Eosinophils Absolute: 0 10*3/uL (ref 0.0–0.5)
HCT: 37 % — ABNORMAL LOW (ref 38.4–49.9)
HGB: 11.5 g/dL — ABNORMAL LOW (ref 13.0–17.1)
LYMPH%: 14.9 % (ref 14.0–49.0)
MCH: 26.5 pg — ABNORMAL LOW (ref 27.2–33.4)
MCHC: 31.1 g/dL — ABNORMAL LOW (ref 32.0–36.0)
MCV: 85.3 fL (ref 79.3–98.0)
MONO#: 0.5 10*3/uL (ref 0.1–0.9)
MONO%: 7 % (ref 0.0–14.0)
NEUT#: 5.9 10*3/uL (ref 1.5–6.5)
NEUT%: 78 % — ABNORMAL HIGH (ref 39.0–75.0)
Platelets: 96 10*3/uL — ABNORMAL LOW (ref 140–400)
RBC: 4.34 10*6/uL (ref 4.20–5.82)
RDW: 22.6 % — ABNORMAL HIGH (ref 11.0–14.6)
WBC: 7.6 10*3/uL (ref 4.0–10.3)
lymph#: 1.1 10*3/uL (ref 0.9–3.3)
nRBC: 0 % (ref 0–0)

## 2015-06-16 LAB — TECHNOLOGIST REVIEW

## 2015-06-23 ENCOUNTER — Ambulatory Visit: Payer: Medicare Other | Admitting: Nutrition

## 2015-06-23 ENCOUNTER — Ambulatory Visit (HOSPITAL_BASED_OUTPATIENT_CLINIC_OR_DEPARTMENT_OTHER): Payer: Medicare Other | Admitting: Internal Medicine

## 2015-06-23 ENCOUNTER — Telehealth: Payer: Self-pay | Admitting: *Deleted

## 2015-06-23 ENCOUNTER — Ambulatory Visit (HOSPITAL_BASED_OUTPATIENT_CLINIC_OR_DEPARTMENT_OTHER): Payer: Medicare Other

## 2015-06-23 ENCOUNTER — Other Ambulatory Visit (HOSPITAL_BASED_OUTPATIENT_CLINIC_OR_DEPARTMENT_OTHER): Payer: Medicare Other

## 2015-06-23 ENCOUNTER — Encounter: Payer: Self-pay | Admitting: Internal Medicine

## 2015-06-23 ENCOUNTER — Telehealth: Payer: Self-pay | Admitting: Internal Medicine

## 2015-06-23 ENCOUNTER — Other Ambulatory Visit: Payer: Medicare Other

## 2015-06-23 VITALS — BP 151/66 | HR 104 | Temp 98.6°F | Resp 18 | Ht 71.0 in | Wt 135.3 lb

## 2015-06-23 DIAGNOSIS — Z5111 Encounter for antineoplastic chemotherapy: Secondary | ICD-10-CM

## 2015-06-23 DIAGNOSIS — C3491 Malignant neoplasm of unspecified part of right bronchus or lung: Secondary | ICD-10-CM

## 2015-06-23 DIAGNOSIS — C3411 Malignant neoplasm of upper lobe, right bronchus or lung: Secondary | ICD-10-CM

## 2015-06-23 DIAGNOSIS — Z720 Tobacco use: Secondary | ICD-10-CM | POA: Diagnosis not present

## 2015-06-23 LAB — CBC WITH DIFFERENTIAL/PLATELET
BASO%: 0.5 % (ref 0.0–2.0)
Basophils Absolute: 0 10*3/uL (ref 0.0–0.1)
EOS%: 0.2 % (ref 0.0–7.0)
Eosinophils Absolute: 0 10*3/uL (ref 0.0–0.5)
HCT: 36.3 % — ABNORMAL LOW (ref 38.4–49.9)
HGB: 11.3 g/dL — ABNORMAL LOW (ref 13.0–17.1)
LYMPH%: 16.3 % (ref 14.0–49.0)
MCH: 26.7 pg — ABNORMAL LOW (ref 27.2–33.4)
MCHC: 31.1 g/dL — ABNORMAL LOW (ref 32.0–36.0)
MCV: 85.8 fL (ref 79.3–98.0)
MONO#: 0.8 10*3/uL (ref 0.1–0.9)
MONO%: 12.6 % (ref 0.0–14.0)
NEUT#: 4.4 10*3/uL (ref 1.5–6.5)
NEUT%: 70.4 % (ref 39.0–75.0)
Platelets: 164 10*3/uL (ref 140–400)
RBC: 4.23 10*6/uL (ref 4.20–5.82)
RDW: 22 % — ABNORMAL HIGH (ref 11.0–14.6)
WBC: 6.2 10*3/uL (ref 4.0–10.3)
lymph#: 1 10*3/uL (ref 0.9–3.3)
nRBC: 0 % (ref 0–0)

## 2015-06-23 LAB — COMPREHENSIVE METABOLIC PANEL
ALT: 15 U/L (ref 0–55)
AST: 19 U/L (ref 5–34)
Albumin: 3.6 g/dL (ref 3.5–5.0)
Alkaline Phosphatase: 63 U/L (ref 40–150)
Anion Gap: 8 mEq/L (ref 3–11)
BUN: 6.3 mg/dL — ABNORMAL LOW (ref 7.0–26.0)
CO2: 28 mEq/L (ref 22–29)
Calcium: 9.2 mg/dL (ref 8.4–10.4)
Chloride: 105 mEq/L (ref 98–109)
Creatinine: 0.9 mg/dL (ref 0.7–1.3)
EGFR: >90 mL/min/{1.73_m2} (ref >90)
Glucose: 93 mg/dl (ref 70–140)
Potassium: 3.8 mEq/L (ref 3.5–5.1)
Sodium: 142 mEq/L (ref 136–145)
Total Bilirubin: <0.3 mg/dL (ref 0.20–1.20)
Total Protein: 7.4 g/dL (ref 6.4–8.3)

## 2015-06-23 MED ORDER — PALONOSETRON HCL INJECTION 0.25 MG/5ML
INTRAVENOUS | Status: AC
  Filled 2015-06-23: qty 5

## 2015-06-23 MED ORDER — SODIUM CHLORIDE 0.9 % IV SOLN
425.5000 mg | Freq: Once | INTRAVENOUS | Status: AC
  Administered 2015-06-23: 430 mg via INTRAVENOUS
  Filled 2015-06-23: qty 43

## 2015-06-23 MED ORDER — HEPARIN SOD (PORK) LOCK FLUSH 100 UNIT/ML IV SOLN
500.0000 [IU] | Freq: Once | INTRAVENOUS | Status: AC | PRN
  Administered 2015-06-23: 500 [IU]
  Filled 2015-06-23: qty 5

## 2015-06-23 MED ORDER — PACLITAXEL CHEMO INJECTION 300 MG/50ML
175.0000 mg/m2 | Freq: Once | INTRAVENOUS | Status: AC
  Administered 2015-06-23: 306 mg via INTRAVENOUS
  Filled 2015-06-23: qty 51

## 2015-06-23 MED ORDER — SODIUM CHLORIDE 0.9 % IV SOLN
10.0000 mg | Freq: Once | INTRAVENOUS | Status: AC
  Administered 2015-06-23: 10 mg via INTRAVENOUS
  Filled 2015-06-23: qty 1

## 2015-06-23 MED ORDER — SODIUM CHLORIDE 0.9 % IJ SOLN
10.0000 mL | INTRAMUSCULAR | Status: DC | PRN
  Administered 2015-06-23: 10 mL
  Filled 2015-06-23: qty 10

## 2015-06-23 MED ORDER — DIPHENHYDRAMINE HCL 50 MG/ML IJ SOLN
INTRAMUSCULAR | Status: AC
  Filled 2015-06-23: qty 1

## 2015-06-23 MED ORDER — PALONOSETRON HCL INJECTION 0.25 MG/5ML
0.2500 mg | Freq: Once | INTRAVENOUS | Status: AC
  Administered 2015-06-23: 0.25 mg via INTRAVENOUS

## 2015-06-23 MED ORDER — FAMOTIDINE IN NACL 20-0.9 MG/50ML-% IV SOLN
INTRAVENOUS | Status: AC
Start: 2015-06-23 — End: 2015-06-23
  Filled 2015-06-23: qty 50

## 2015-06-23 MED ORDER — DIPHENHYDRAMINE HCL 50 MG/ML IJ SOLN
50.0000 mg | Freq: Once | INTRAMUSCULAR | Status: AC
  Administered 2015-06-23: 50 mg via INTRAVENOUS

## 2015-06-23 MED ORDER — FAMOTIDINE IN NACL 20-0.9 MG/50ML-% IV SOLN
20.0000 mg | Freq: Once | INTRAVENOUS | Status: AC
  Administered 2015-06-23: 20 mg via INTRAVENOUS

## 2015-06-23 MED ORDER — SODIUM CHLORIDE 0.9 % IV SOLN
Freq: Once | INTRAVENOUS | Status: AC
  Administered 2015-06-23: 11:00:00 via INTRAVENOUS

## 2015-06-23 NOTE — Telephone Encounter (Signed)
Per staff message and POF I have scheduled appts. Advised scheduler of appts. JMW  

## 2015-06-23 NOTE — Patient Instructions (Signed)
[facility] Discharge Instructions for Patients Receiving Chemotherapy  Today you received the following chemotherapy agents Taxol/Carboplatin  To help prevent nausea and vomiting after your treatment, we encourage you to take your nausea medication    If you develop nausea and vomiting that is not controlled by your nausea medication, call the clinic.   BELOW ARE SYMPTOMS THAT SHOULD BE REPORTED IMMEDIATELY:  *FEVER GREATER THAN 100.5 F  *CHILLS WITH OR WITHOUT FEVER  NAUSEA AND VOMITING THAT IS NOT CONTROLLED WITH YOUR NAUSEA MEDICATION  *UNUSUAL SHORTNESS OF BREATH  *UNUSUAL BRUISING OR BLEEDING  TENDERNESS IN MOUTH AND THROAT WITH OR WITHOUT PRESENCE OF ULCERS  *URINARY PROBLEMS  *BOWEL PROBLEMS  UNUSUAL RASH Items with * indicate a potential emergency and should be followed up as soon as possible.  Feel free to call the clinic you have any questions or concerns. The clinic phone number is [phone].  Please show the CHEMO ALERT CARD at check-in to the Emergency Department and triage nurse.   

## 2015-06-23 NOTE — Telephone Encounter (Signed)
per of to sch pt appt-sent MW emailto move pt trmt to coordinate w/MD-pt to get updated copy b4 leaving trmt room

## 2015-06-23 NOTE — Progress Notes (Signed)
[facility] [phone]   [phone]  OFFICE PROGRESS NOTE  [name], FNP [address] 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  PRIOR THERAPY: Course of concurrent chemoradiation with weekly carboplatin for AUC of 2 and paclitaxel 45 MG/M2. First dose 03/01/2015. He is status post 6 cycles. Last cycle was given 04/12/2015 was partial response.  CURRENT THERAPY: Consolidation chemotherapy with reduced dose carboplatin for AUC of 5 and paclitaxel 175 MG/M2 every 3 weeks, status post 1 cycle.  INTERVAL HISTORY: Ivan Daniels 70 y.o. male returns to the clinic today for follow-up visit accompanied by his sister and niece. The patient tolerated the first cycle of his consolidation chemotherapy fairly well. He has alopecia but denied having any significant nausea or vomiting. He has no fever or chills. He denied having any significant chest pain, shortness of breath, cough or hemoptysis. He is here today to start cycle #2 of consolidation chemotherapy.  MEDICAL HISTORY: Past Medical History  Diagnosis Date  . Hyperlipidemia   . TOBACCO ABUSE 05/24/2009    Qualifier: Diagnosis of  By: Hassell Done FNP, Tori Milks    . Paranoid schizophrenia ([place]) 10/31/2009    Qualifier: Diagnosis of  By: Jorene Minors, Scott    . BENIGN PROSTATIC HYPERTROPHY, HX OF 12/19/2006    Qualifier: Diagnosis of  By: Radene Ou MD, [place]    . Primary spontaneous pneumothorax, left 01/25/2015  . Lung mass 01/25/2015    3.7 x 3.6 cm RUL spiculated lung mass with 2.0 cm right paratracheal lymph node suspicious for bronchogenic CA  . Full code status 02/10/2015  . Allergy   . ALCOHOL ABUSE 11/12/2009    Qualifier: Diagnosis of  By: Hassell Done FNP, Tori Milks    .  Non-small cell lung cancer ([place]) 01/27/15    non small-cell,squamous cell ca RUL    ALLERGIES:  is allergic to benztropine mesylate and chlorpromazine hcl.  MEDICATIONS:  Current Outpatient Prescriptions  Medication Sig Dispense Refill  . benztropine (COGENTIN) 0.5 MG tablet Take 0.5 mg by mouth at bedtime.     . chlorhexidine (PERIDEX) 0.12 % solution 1 mL by Mouth Rinse route See admin instructions.  1  . clopidogrel (PLAVIX) 75 MG tablet Take 75 mg by mouth daily.     . ferrous fumarate (HEMOCYTE - 106 MG FE) 325 (106 FE) MG TABS tablet Take 1 tablet by mouth daily.     . haloperidol (HALDOL) 5 MG tablet Take 5 mg by mouth at bedtime.     . lidocaine-prilocaine (EMLA) cream Apply 1 application topically as needed. (Patient not taking: Reported on 05/21/2015) 30 g 0  . Multiple Vitamins-Minerals (MULTIVITAMIN WITH MINERALS) tablet Take 1 tablet by mouth daily.    Marland Kitchen oxyCODONE (OXY IR/ROXICODONE) 5 MG immediate release tablet Take 1 tablet (5 mg total) by mouth every 3 (three) hours as needed for moderate pain. (Patient not taking: Reported on 05/24/2015) 30 tablet 0  . oxyCODONE 10 MG TABS Take 1 tablet (10 mg total) by mouth every 3 (three) hours as needed for severe pain. (Patient not taking: Reported on 05/24/2015) 30 tablet 0  . prochlorperazine (COMPAZINE) 10 MG tablet Take 1 tablet (10 mg total) by mouth every 6 (six) hours as needed for nausea or vomiting. (Patient not taking: Reported  on 05/24/2015) 30 tablet 0  . simvastatin (ZOCOR) 20 MG tablet Take 20 mg by mouth at bedtime.     . sucralfate (CARAFATE) 1 G tablet Take 1 tablet (1 g total) by mouth 4 (four) times daily. 120 tablet 2  . Wound Dressings (SONAFINE) Apply 1 application topically 3 (three) times daily.     No current facility-administered medications for this visit.    SURGICAL HISTORY:  Past Surgical History  Procedure Laterality Date  . Video bronchoscopy with endobronchial ultrasound N/A 01/27/2015    Procedure:  VIDEO BRONCHOSCOPY WITH ENDOBRONCHIAL ULTRASOUND;  Surgeon: Grace Isaac, MD;  Location: [facility];  Service: Thoracic;  Laterality: N/A;  . Lung biopsy Right 01/27/2015    Procedure: Right Upper Lobe Bronchus BIOPSY;  Surgeon: Grace Isaac, MD;  Location: [facility];  Service: Thoracic;  Laterality: Right;    REVIEW OF SYSTEMS:  A comprehensive review of systems was negative.   PHYSICAL EXAMINATION: General appearance: alert, cooperative and no distress Head: Normocephalic, without obvious abnormality, atraumatic Neck: no adenopathy, no JVD, supple, symmetrical, trachea midline and thyroid not enlarged, symmetric, no tenderness/mass/nodules Lymph nodes: Cervical, supraclavicular, and axillary nodes normal. Resp: clear to auscultation bilaterally Back: symmetric, no curvature. ROM normal. No CVA tenderness. Cardio: regular rate and rhythm, S1, S2 normal, no murmur, click, rub or gallop GI: soft, non-tender; bowel sounds normal; no masses,  no organomegaly Extremities: extremities normal, atraumatic, no cyanosis or edema  ECOG PERFORMANCE STATUS: 1 - Symptomatic but completely ambulatory  Blood pressure 151/66, pulse 104, temperature 98.6 F (37 C), temperature source Oral, resp. rate 18, height '5\' 11"'$  (1.803 m), weight 135 lb 4.8 oz (61.372 kg), SpO2 100 %.  LABORATORY DATA: Lab Results  Component Value Date   WBC 6.2 06/23/2015   HGB 11.3* 06/23/2015   HCT 36.3* 06/23/2015   MCV 85.8 06/23/2015   PLT 164 06/23/2015      Chemistry      Component Value Date/Time   NA 142 06/23/2015 0915   NA 138 01/26/2015 1454   K 3.8 06/23/2015 0915   K 3.7 01/26/2015 1454   CL 101 01/26/2015 1454   CO2 28 06/23/2015 0915   CO2 28 01/26/2015 1454   BUN 6.3* 06/23/2015 0915   BUN 9 01/26/2015 1454   CREATININE 0.9 06/23/2015 0915   CREATININE 0.95 01/26/2015 1454      Component Value Date/Time   CALCIUM 9.2 06/23/2015 0915   CALCIUM 9.1 01/26/2015 1454   ALKPHOS 63 06/23/2015 0915    ALKPHOS 52 01/26/2015 1454   AST 19 06/23/2015 0915   AST 21 01/26/2015 1454   ALT 15 06/23/2015 0915   ALT 11* 01/26/2015 1454   BILITOT <0.30 06/23/2015 0915   BILITOT <0.1* 01/26/2015 1454       RADIOGRAPHIC STUDIES: No results found.  ASSESSMENT AND PLAN: This is a very pleasant 70 years old African-American male with a stage IIIA non-small cell lung cancer and started on concurrent chemoradiation with weekly carboplatin and paclitaxel. Status post 6 week of treatment. He tolerated his treatment well. He has been off treatment for the last few weeks. The recent CT scan of the chest showed partial improvement of his disease. He is currently undergoing consolidation chemotherapy with reduced dose carboplatin and paclitaxel is status post 1 cycle. He tolerated the first cycle well. I recommended for the patient to proceed with cycle #2 today as a scheduled. The patient would come back for follow-up visit in 3 weeks for  reevaluation before starting cycle #3. For smoke cessation, I strongly encouraged the patient to quit smoking. The patient was advised to call immediately if he has any concerning symptoms in the interval. The patient voices understanding of current disease status and treatment options and is in agreement with the current care plan.  All questions were answered. The patient knows to call the clinic with any problems, questions or concerns. We can certainly see the patient much sooner if necessary.  Disclaimer: This note was dictated with voice recognition software. Similar sounding words can inadvertently be transcribed and may not be corrected upon review.

## 2015-06-23 NOTE — Progress Notes (Signed)
Nutrition follow-up completed with patient and wife, during infusion for lung cancer. Weight improved documented as 135.3 pounds February 15 increased from 128 pounds November 28. Patient reports esophagitis has resolved. He denies nutrition impact symptoms.  Nutrition diagnosis:  Food and nutrition related knowledge deficit has improved.  Intervention: Educated patient to continue strategies for increased calories and protein adding oral nutrition supplements as needed. Provided oral nutrition supplement samples and coupons. Questions were answered.  Teach back method used.  Monitoring, evaluation, goals: Patient will tolerate increased calories and protein to minimize weight loss.  Next visit:Wednesday, March 8, during infusion.  **Disclaimer: This note was dictated with voice recognition software. Similar sounding words can inadvertently be transcribed and this note may contain transcription errors which may not have been corrected upon publication of note.**

## 2015-06-25 ENCOUNTER — Ambulatory Visit (HOSPITAL_BASED_OUTPATIENT_CLINIC_OR_DEPARTMENT_OTHER): Payer: Medicare Other

## 2015-06-25 VITALS — BP 112/60 | HR 105 | Temp 98.3°F

## 2015-06-25 DIAGNOSIS — C3491 Malignant neoplasm of unspecified part of right bronchus or lung: Secondary | ICD-10-CM

## 2015-06-25 DIAGNOSIS — C3411 Malignant neoplasm of upper lobe, right bronchus or lung: Secondary | ICD-10-CM

## 2015-06-25 DIAGNOSIS — Z5189 Encounter for other specified aftercare: Secondary | ICD-10-CM

## 2015-06-25 MED ORDER — PEGFILGRASTIM INJECTION 6 MG/0.6ML ~~LOC~~
6.0000 mg | PREFILLED_SYRINGE | Freq: Once | SUBCUTANEOUS | Status: AC
  Administered 2015-06-25: 6 mg via SUBCUTANEOUS
  Filled 2015-06-25: qty 0.6

## 2015-06-25 NOTE — Patient Instructions (Signed)
Pegfilgrastim injection What is this medicine? PEGFILGRASTIM (PEG fil gra stim) is a long-acting granulocyte colony-stimulating factor that stimulates the growth of neutrophils, a type of white blood cell important in the body's fight against infection. It is used to reduce the incidence of fever and infection in patients with certain types of cancer who are receiving chemotherapy that affects the bone marrow, and to increase survival after being exposed to high doses of radiation. This medicine may be used for other purposes; ask your health care provider or pharmacist if you have questions. What should I tell my health care provider before I take this medicine? They need to know if you have any of these conditions: -kidney disease -latex allergy -ongoing radiation therapy -sickle cell disease -skin reactions to acrylic adhesives (On-Body Injector only) -an unusual or allergic reaction to pegfilgrastim, filgrastim, other medicines, foods, dyes, or preservatives -pregnant or trying to get pregnant -breast-feeding How should I use this medicine? This medicine is for injection under the skin. If you get this medicine at home, you will be taught how to prepare and give the pre-filled syringe or how to use the On-body Injector. Refer to the patient Instructions for Use for detailed instructions. Use exactly as directed. Take your medicine at regular intervals. Do not take your medicine more often than directed. It is important that you put your used needles and syringes in a special sharps container. Do not put them in a trash can. If you do not have a sharps container, call your pharmacist or healthcare provider to get one. Talk to your pediatrician regarding the use of this medicine in children. While this drug may be prescribed for selected conditions, precautions do apply. Overdosage: If you think you have taken too much of this medicine contact a poison control center or emergency room at  once. NOTE: This medicine is only for you. Do not share this medicine with others. What if I miss a dose? It is important not to miss your dose. Call your doctor or health care professional if you miss your dose. If you miss a dose due to an On-body Injector failure or leakage, a new dose should be administered as soon as possible using a single prefilled syringe for manual use. What may interact with this medicine? Interactions have not been studied. Give your health care provider a list of all the medicines, herbs, non-prescription drugs, or dietary supplements you use. Also tell them if you smoke, drink alcohol, or use illegal drugs. Some items may interact with your medicine. This list may not describe all possible interactions. Give your health care provider a list of all the medicines, herbs, non-prescription drugs, or dietary supplements you use. Also tell them if you smoke, drink alcohol, or use illegal drugs. Some items may interact with your medicine. What should I watch for while using this medicine? You may need blood work done while you are taking this medicine. If you are going to need a MRI, CT scan, or other procedure, tell your doctor that you are using this medicine (On-Body Injector only). What side effects may I notice from receiving this medicine? Side effects that you should report to your doctor or health care professional as soon as possible: -allergic reactions like skin rash, itching or hives, swelling of the face, lips, or tongue -dizziness -fever -pain, redness, or irritation at site where injected -pinpoint red spots on the skin -red or dark-brown urine -shortness of breath or breathing problems -stomach or side pain, or pain   at the shoulder -swelling -tiredness -trouble passing urine or change in the amount of urine Side effects that usually do not require medical attention (report to your doctor or health care professional if they continue or are  bothersome): -bone pain -muscle pain This list may not describe all possible side effects. Call your doctor for medical advice about side effects. You may report side effects to FDA at 1-800-FDA-1088. Where should I keep my medicine? Keep out of the reach of children. Store pre-filled syringes in a refrigerator between 2 and 8 degrees C (36 and 46 degrees F). Do not freeze. Keep in carton to protect from light. Throw away this medicine if it is left out of the refrigerator for more than 48 hours. Throw away any unused medicine after the expiration date. NOTE: This sheet is a summary. It may not cover all possible information. If you have questions about this medicine, talk to your doctor, pharmacist, or health care provider.    2016, Elsevier/Gold Standard. (2014-05-14 14:30:14)  

## 2015-06-30 ENCOUNTER — Telehealth: Payer: Self-pay | Admitting: Medical Oncology

## 2015-06-30 ENCOUNTER — Other Ambulatory Visit (HOSPITAL_BASED_OUTPATIENT_CLINIC_OR_DEPARTMENT_OTHER): Payer: Medicare Other

## 2015-06-30 DIAGNOSIS — C3411 Malignant neoplasm of upper lobe, right bronchus or lung: Secondary | ICD-10-CM | POA: Diagnosis present

## 2015-06-30 DIAGNOSIS — C3491 Malignant neoplasm of unspecified part of right bronchus or lung: Secondary | ICD-10-CM

## 2015-06-30 LAB — CBC WITH DIFFERENTIAL/PLATELET
BASO%: 1.1 % (ref 0.0–2.0)
Basophils Absolute: 0 10*3/uL (ref 0.0–0.1)
EOS%: 2.1 % (ref 0.0–7.0)
Eosinophils Absolute: 0 10*3/uL (ref 0.0–0.5)
HCT: 33.4 % — ABNORMAL LOW (ref 38.4–49.9)
HGB: 10.5 g/dL — ABNORMAL LOW (ref 13.0–17.1)
LYMPH%: 60 % — ABNORMAL HIGH (ref 14.0–49.0)
MCH: 26.9 pg — ABNORMAL LOW (ref 27.2–33.4)
MCHC: 31.4 g/dL — ABNORMAL LOW (ref 32.0–36.0)
MCV: 85.4 fL (ref 79.3–98.0)
MONO#: 0.1 10*3/uL (ref 0.1–0.9)
MONO%: 10.5 % (ref 0.0–14.0)
NEUT#: 0.3 10*3/uL — CL (ref 1.5–6.5)
NEUT%: 26.3 % — ABNORMAL LOW (ref 39.0–75.0)
Platelets: 77 10*3/uL — ABNORMAL LOW (ref 140–400)
RBC: 3.91 10*6/uL — ABNORMAL LOW (ref 4.20–5.82)
RDW: 21.2 % — ABNORMAL HIGH (ref 11.0–14.6)
WBC: 1 10*3/uL — ABNORMAL LOW (ref 4.0–10.3)
lymph#: 0.6 10*3/uL — ABNORMAL LOW (ref 0.9–3.3)
nRBC: 0 % (ref 0–0)

## 2015-06-30 LAB — COMPREHENSIVE METABOLIC PANEL
ALT: 33 U/L (ref 0–55)
AST: 22 U/L (ref 5–34)
Albumin: 3.6 g/dL (ref 3.5–5.0)
Alkaline Phosphatase: 53 U/L (ref 40–150)
Anion Gap: 9 mEq/L (ref 3–11)
BUN: 10.9 mg/dL (ref 7.0–26.0)
CO2: 27 mEq/L (ref 22–29)
Calcium: 9.3 mg/dL (ref 8.4–10.4)
Chloride: 102 mEq/L (ref 98–109)
Creatinine: 0.9 mg/dL (ref 0.7–1.3)
EGFR: >90 mL/min/{1.73_m2} (ref >90)
Glucose: 112 mg/dl (ref 70–140)
Potassium: 4.2 mEq/L (ref 3.5–5.1)
Sodium: 138 mEq/L (ref 136–145)
Total Bilirubin: 0.49 mg/dL (ref 0.20–1.20)
Total Protein: 7.3 g/dL (ref 6.4–8.3)

## 2015-06-30 LAB — TECHNOLOGIST REVIEW

## 2015-06-30 NOTE — Telephone Encounter (Signed)
Vm to sister to implement neutropenic precautions for pt and to call for temp >100.5 f.

## 2015-06-30 NOTE — Telephone Encounter (Signed)
"  His sister Jetty Duhamel calling about his blood counts.  Are they really low?" Advise counts = 0.3.  Reviewed neutropenic precautions.  Answered questions about what he can eat.  Dorothy verbalized understanding.  If fever with chills and shakes report to ED notifying them he is an oncology patient to follow the [facility] ED guidelines.

## 2015-07-07 ENCOUNTER — Other Ambulatory Visit (HOSPITAL_BASED_OUTPATIENT_CLINIC_OR_DEPARTMENT_OTHER): Payer: Medicare Other

## 2015-07-07 ENCOUNTER — Ambulatory Visit (INDEPENDENT_AMBULATORY_CARE_PROVIDER_SITE_OTHER): Payer: Medicare Other | Admitting: Podiatry

## 2015-07-07 ENCOUNTER — Encounter: Payer: Self-pay | Admitting: Podiatry

## 2015-07-07 DIAGNOSIS — C3411 Malignant neoplasm of upper lobe, right bronchus or lung: Secondary | ICD-10-CM

## 2015-07-07 DIAGNOSIS — M79673 Pain in unspecified foot: Secondary | ICD-10-CM | POA: Diagnosis not present

## 2015-07-07 DIAGNOSIS — Q828 Other specified congenital malformations of skin: Secondary | ICD-10-CM

## 2015-07-07 DIAGNOSIS — C3491 Malignant neoplasm of unspecified part of right bronchus or lung: Secondary | ICD-10-CM

## 2015-07-07 LAB — CBC WITH DIFFERENTIAL/PLATELET
BASO%: 0.1 % (ref 0.0–2.0)
Basophils Absolute: 0 10*3/uL (ref 0.0–0.1)
EOS%: 0 % (ref 0.0–7.0)
Eosinophils Absolute: 0 10*3/uL (ref 0.0–0.5)
HCT: 32.5 % — ABNORMAL LOW (ref 38.4–49.9)
HGB: 10.2 g/dL — ABNORMAL LOW (ref 13.0–17.1)
LYMPH%: 10.8 % — ABNORMAL LOW (ref 14.0–49.0)
MCH: 26.9 pg — ABNORMAL LOW (ref 27.2–33.4)
MCHC: 31.4 g/dL — ABNORMAL LOW (ref 32.0–36.0)
MCV: 85.8 fL (ref 79.3–98.0)
MONO#: 1.5 10*3/uL — ABNORMAL HIGH (ref 0.1–0.9)
MONO%: 11.1 % (ref 0.0–14.0)
NEUT#: 10.5 10*3/uL — ABNORMAL HIGH (ref 1.5–6.5)
NEUT%: 78 % — ABNORMAL HIGH (ref 39.0–75.0)
Platelets: 110 10*3/uL — ABNORMAL LOW (ref 140–400)
RBC: 3.79 10*6/uL — ABNORMAL LOW (ref 4.20–5.82)
RDW: 22.5 % — ABNORMAL HIGH (ref 11.0–14.6)
WBC: 13.5 10*3/uL — ABNORMAL HIGH (ref 4.0–10.3)
lymph#: 1.5 10*3/uL (ref 0.9–3.3)
nRBC: 0 % (ref 0–0)

## 2015-07-07 LAB — COMPREHENSIVE METABOLIC PANEL
ALT: 11 U/L (ref 0–55)
AST: 16 U/L (ref 5–34)
Albumin: 3.4 g/dL — ABNORMAL LOW (ref 3.5–5.0)
Alkaline Phosphatase: 95 U/L (ref 40–150)
Anion Gap: 11 mEq/L (ref 3–11)
BUN: 8.6 mg/dL (ref 7.0–26.0)
CO2: 26 mEq/L (ref 22–29)
Calcium: 9.3 mg/dL (ref 8.4–10.4)
Chloride: 105 mEq/L (ref 98–109)
Creatinine: 1 mg/dL (ref 0.7–1.3)
EGFR: >90 mL/min/{1.73_m2} (ref >90)
Glucose: 92 mg/dl (ref 70–140)
Potassium: 3.9 mEq/L (ref 3.5–5.1)
Sodium: 141 mEq/L (ref 136–145)
Total Bilirubin: <0.3 mg/dL (ref 0.20–1.20)
Total Protein: 7.3 g/dL (ref 6.4–8.3)

## 2015-07-07 LAB — TECHNOLOGIST REVIEW

## 2015-07-07 NOTE — Progress Notes (Signed)
Subjective:     Patient ID: CHUONG CASEBEER, male   DOB: April 25, 1946, 70 y.o.   MRN: [id number]  HPIThis patient presents to my office with painful areas on the bottom of both feet.  He says he lost his toes to frostbite and now develops callused areas on the bottom of both feet.   Review of Systems     Objective:   Physical Exam Objective: Review of past medical history, medications, social history and allergies were performed.  Vascular: Dorsalis pedis and posterior tibial pulses were palpable B/L, capillary refill was  WNL B/L, temperature gradient was WNL B/L   Skin:  No signs of symptoms of infection or ulcers on both feet.  Porokeratosis sub2 right foot and sub 3,4 th left foot.   Sensory: Thornell Mule monifilament WNL   Orthopedic: Orthopedic evaluation demonstrates all joints distal t ankle have full ROM without crepitus, muscle power WNL B/L    Assessment:     Porokeratosis B/L     Plan:    Debridement of porokeratosis RTC 10 weeks   Gardiner Barefoot DPM

## 2015-07-13 ENCOUNTER — Telehealth: Payer: Self-pay | Admitting: *Deleted

## 2015-07-13 NOTE — Telephone Encounter (Signed)
I left a message at another number [phone], to call the desk RN regarding appts

## 2015-07-13 NOTE — Telephone Encounter (Signed)
Multi attempts made to contact patient regarding appts for tomorrow. The phone stated"you available to take call" then disconnects. Desk RN aware

## 2015-07-14 ENCOUNTER — Ambulatory Visit: Payer: Medicare Other

## 2015-07-14 ENCOUNTER — Other Ambulatory Visit: Payer: Self-pay | Admitting: *Deleted

## 2015-07-14 ENCOUNTER — Other Ambulatory Visit (HOSPITAL_BASED_OUTPATIENT_CLINIC_OR_DEPARTMENT_OTHER): Payer: Medicare Other

## 2015-07-14 ENCOUNTER — Encounter: Payer: Medicare Other | Admitting: Nutrition

## 2015-07-14 ENCOUNTER — Telehealth: Payer: Self-pay | Admitting: Internal Medicine

## 2015-07-14 ENCOUNTER — Other Ambulatory Visit: Payer: Medicare Other

## 2015-07-14 DIAGNOSIS — C3491 Malignant neoplasm of unspecified part of right bronchus or lung: Secondary | ICD-10-CM

## 2015-07-14 DIAGNOSIS — C3411 Malignant neoplasm of upper lobe, right bronchus or lung: Secondary | ICD-10-CM

## 2015-07-14 LAB — CBC WITH DIFFERENTIAL/PLATELET
BASO%: 0.3 % (ref 0.0–2.0)
Basophils Absolute: 0 10*3/uL (ref 0.0–0.1)
EOS%: 0.1 % (ref 0.0–7.0)
Eosinophils Absolute: 0 10*3/uL (ref 0.0–0.5)
HCT: 34 % — ABNORMAL LOW (ref 38.4–49.9)
HGB: 10.8 g/dL — ABNORMAL LOW (ref 13.0–17.1)
LYMPH%: 12.1 % — ABNORMAL LOW (ref 14.0–49.0)
MCH: 27 pg — ABNORMAL LOW (ref 27.2–33.4)
MCHC: 31.7 g/dL — ABNORMAL LOW (ref 32.0–36.0)
MCV: 85.1 fL (ref 79.3–98.0)
MONO#: 0.8 10*3/uL (ref 0.1–0.9)
MONO%: 9.7 % (ref 0.0–14.0)
NEUT#: 6.4 10*3/uL (ref 1.5–6.5)
NEUT%: 77.8 % — ABNORMAL HIGH (ref 39.0–75.0)
Platelets: 224 10*3/uL (ref 140–400)
RBC: 4 10*6/uL — ABNORMAL LOW (ref 4.20–5.82)
RDW: 24.9 % — ABNORMAL HIGH (ref 11.0–14.6)
WBC: 8.2 10*3/uL (ref 4.0–10.3)
lymph#: 1 10*3/uL (ref 0.9–3.3)

## 2015-07-14 LAB — COMPREHENSIVE METABOLIC PANEL
ALT: 9 U/L (ref 0–55)
AST: 16 U/L (ref 5–34)
Albumin: 3.6 g/dL (ref 3.5–5.0)
Alkaline Phosphatase: 71 U/L (ref 40–150)
Anion Gap: 10 mEq/L (ref 3–11)
BUN: 12.3 mg/dL (ref 7.0–26.0)
CO2: 27 mEq/L (ref 22–29)
Calcium: 9.6 mg/dL (ref 8.4–10.4)
Chloride: 102 mEq/L (ref 98–109)
Creatinine: 1 mg/dL (ref 0.7–1.3)
EGFR: >90 mL/min/{1.73_m2} (ref >90)
Glucose: 86 mg/dl (ref 70–140)
Potassium: 4.1 mEq/L (ref 3.5–5.1)
Sodium: 139 mEq/L (ref 136–145)
Total Bilirubin: <0.3 mg/dL (ref 0.20–1.20)
Total Protein: 7.7 g/dL (ref 6.4–8.3)

## 2015-07-14 NOTE — Telephone Encounter (Signed)
per Stanton Kidney to sch pt lab-staff message sent 2/15 to coordinate trmt-never moved-pt r/s for 07/15/15

## 2015-07-15 ENCOUNTER — Ambulatory Visit (HOSPITAL_BASED_OUTPATIENT_CLINIC_OR_DEPARTMENT_OTHER): Payer: Medicare Other | Admitting: Internal Medicine

## 2015-07-15 ENCOUNTER — Other Ambulatory Visit: Payer: Medicare Other

## 2015-07-15 ENCOUNTER — Ambulatory Visit: Payer: Medicare Other | Admitting: Nutrition

## 2015-07-15 ENCOUNTER — Encounter: Payer: Self-pay | Admitting: Internal Medicine

## 2015-07-15 ENCOUNTER — Ambulatory Visit (HOSPITAL_BASED_OUTPATIENT_CLINIC_OR_DEPARTMENT_OTHER): Payer: Medicare Other

## 2015-07-15 ENCOUNTER — Telehealth: Payer: Self-pay | Admitting: Internal Medicine

## 2015-07-15 VITALS — BP 102/72 | HR 122 | Temp 98.2°F | Resp 17 | Ht 71.0 in | Wt 131.2 lb

## 2015-07-15 DIAGNOSIS — C3411 Malignant neoplasm of upper lobe, right bronchus or lung: Secondary | ICD-10-CM

## 2015-07-15 DIAGNOSIS — Z5111 Encounter for antineoplastic chemotherapy: Secondary | ICD-10-CM | POA: Diagnosis present

## 2015-07-15 DIAGNOSIS — Z720 Tobacco use: Secondary | ICD-10-CM

## 2015-07-15 DIAGNOSIS — C3491 Malignant neoplasm of unspecified part of right bronchus or lung: Secondary | ICD-10-CM

## 2015-07-15 MED ORDER — FAMOTIDINE IN NACL 20-0.9 MG/50ML-% IV SOLN
INTRAVENOUS | Status: AC
  Filled 2015-07-15: qty 50

## 2015-07-15 MED ORDER — DIPHENHYDRAMINE HCL 50 MG/ML IJ SOLN
INTRAMUSCULAR | Status: AC
  Filled 2015-07-15: qty 1

## 2015-07-15 MED ORDER — PALONOSETRON HCL INJECTION 0.25 MG/5ML
0.2500 mg | Freq: Once | INTRAVENOUS | Status: AC
  Administered 2015-07-15: 0.25 mg via INTRAVENOUS

## 2015-07-15 MED ORDER — SODIUM CHLORIDE 0.9 % IV SOLN
Freq: Once | INTRAVENOUS | Status: AC
  Administered 2015-07-15: 13:00:00 via INTRAVENOUS

## 2015-07-15 MED ORDER — HEPARIN SOD (PORK) LOCK FLUSH 100 UNIT/ML IV SOLN
500.0000 [IU] | Freq: Once | INTRAVENOUS | Status: AC | PRN
  Administered 2015-07-15: 500 [IU]
  Filled 2015-07-15: qty 5

## 2015-07-15 MED ORDER — SODIUM CHLORIDE 0.9 % IV SOLN
10.0000 mg | Freq: Once | INTRAVENOUS | Status: AC
  Administered 2015-07-15: 10 mg via INTRAVENOUS
  Filled 2015-07-15: qty 1

## 2015-07-15 MED ORDER — FAMOTIDINE IN NACL 20-0.9 MG/50ML-% IV SOLN
20.0000 mg | Freq: Once | INTRAVENOUS | Status: AC
  Administered 2015-07-15: 20 mg via INTRAVENOUS

## 2015-07-15 MED ORDER — SODIUM CHLORIDE 0.9 % IJ SOLN
10.0000 mL | INTRAMUSCULAR | Status: DC | PRN
  Administered 2015-07-15: 10 mL
  Filled 2015-07-15: qty 10

## 2015-07-15 MED ORDER — SODIUM CHLORIDE 0.9 % IV SOLN
430.0000 mg | Freq: Once | INTRAVENOUS | Status: AC
  Administered 2015-07-15: 430 mg via INTRAVENOUS
  Filled 2015-07-15: qty 43

## 2015-07-15 MED ORDER — DIPHENHYDRAMINE HCL 50 MG/ML IJ SOLN
50.0000 mg | Freq: Once | INTRAMUSCULAR | Status: AC
  Administered 2015-07-15: 50 mg via INTRAVENOUS

## 2015-07-15 MED ORDER — PACLITAXEL CHEMO INJECTION 300 MG/50ML
175.0000 mg/m2 | Freq: Once | INTRAVENOUS | Status: AC
  Administered 2015-07-15: 306 mg via INTRAVENOUS
  Filled 2015-07-15: qty 51

## 2015-07-15 MED ORDER — PALONOSETRON HCL INJECTION 0.25 MG/5ML
INTRAVENOUS | Status: AC
  Filled 2015-07-15: qty 5

## 2015-07-15 NOTE — Progress Notes (Signed)
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  PRIOR THERAPY: Course of concurrent chemoradiation with weekly carboplatin for AUC of 2 and paclitaxel 45 MG/M2. First dose 03/01/2015. He is status post 6 cycles. Last cycle was given 04/12/2015 was partial response.  CURRENT THERAPY: Consolidation chemotherapy with reduced dose carboplatin for AUC of 5 and paclitaxel 175 MG/M2 every 3 weeks, status post 2 cycles.  INTERVAL HISTORY: Ivan Daniels 70 y.o. male returns to the clinic today for follow-up visit accompanied by his sister. The patient tolerated the second cycle of his consolidation chemotherapy fairly well. He continues to have dry cough and occasional dysphagia to solid food. He denied having any significant nausea or vomiting. He has no fever or chills. He denied having any significant chest pain, shortness of breath,or hemoptysis. He is here today to start cycle #3 of consolidation chemotherapy.  MEDICAL HISTORY: Past Medical History  Diagnosis Date  . Hyperlipidemia   . TOBACCO ABUSE 05/24/2009    Qualifier: Diagnosis of  By: Hassell Done FNP, Tori Milks    . Paranoid schizophrenia ([place]) 10/31/2009    Qualifier: Diagnosis of  By: Jorene Minors, Scott    . BENIGN PROSTATIC HYPERTROPHY, HX OF 12/19/2006    Qualifier: Diagnosis of  By: Radene Ou MD, [place]    . Primary spontaneous pneumothorax, left 01/25/2015  . Lung mass 01/25/2015    3.7 x 3.6 cm RUL spiculated lung mass with 2.0 cm right paratracheal lymph node suspicious for bronchogenic CA  . Full code status 02/10/2015  . Allergy   . ALCOHOL ABUSE 11/12/2009    Qualifier: Diagnosis  of  By: Hassell Done FNP, Tori Milks    . Non-small cell lung cancer ([place]) 01/27/15    non small-cell,squamous cell ca RUL    ALLERGIES:  is allergic to benztropine mesylate and chlorpromazine hcl.  MEDICATIONS:  Current Outpatient Prescriptions  Medication Sig Dispense Refill  . benztropine (COGENTIN) 0.5 MG tablet Take 0.5 mg by mouth at bedtime.     . chlorhexidine (PERIDEX) 0.12 % solution 1 mL by Mouth Rinse route See admin instructions.  1  . clopidogrel (PLAVIX) 75 MG tablet Take 75 mg by mouth daily.     . ferrous fumarate (HEMOCYTE - 106 MG FE) 325 (106 FE) MG TABS tablet Take 1 tablet by mouth daily.     . haloperidol (HALDOL) 5 MG tablet Take 5 mg by mouth at bedtime.     . lidocaine-prilocaine (EMLA) cream Apply 1 application topically as needed. 30 g 0  . Multiple Vitamins-Minerals (MULTIVITAMIN WITH MINERALS) tablet Take 1 tablet by mouth daily.    Marland Kitchen oxyCODONE (OXY IR/ROXICODONE) 5 MG immediate release tablet Take 1 tablet (5 mg total) by mouth every 3 (three) hours as needed for moderate pain. 30 tablet 0  . oxyCODONE 10 MG TABS Take 1 tablet (10 mg total) by mouth every 3 (three) hours as needed for severe pain. 30 tablet 0  . prochlorperazine (COMPAZINE) 10 MG tablet Take 1 tablet (10 mg total) by mouth every 6 (six) hours as needed for nausea or vomiting. 30 tablet 0  . simvastatin (ZOCOR) 20 MG tablet Take 20 mg by mouth at bedtime.     Marland Kitchen  sucralfate (CARAFATE) 1 G tablet Take 1 tablet (1 g total) by mouth 4 (four) times daily. 120 tablet 2  . Wound Dressings (SONAFINE) Apply 1 application topically 3 (three) times daily.     No current facility-administered medications for this visit.    SURGICAL HISTORY:  Past Surgical History  Procedure Laterality Date  . Video bronchoscopy with endobronchial ultrasound N/A 01/27/2015    Procedure: VIDEO BRONCHOSCOPY WITH ENDOBRONCHIAL ULTRASOUND;  Surgeon: Grace Isaac, MD;  Location: [facility];  Service: Thoracic;  Laterality: N/A;  .  Lung biopsy Right 01/27/2015    Procedure: Right Upper Lobe Bronchus BIOPSY;  Surgeon: Grace Isaac, MD;  Location: [facility];  Service: Thoracic;  Laterality: Right;    REVIEW OF SYSTEMS:  A comprehensive review of systems was negative except for: Respiratory: positive for cough Gastrointestinal: positive for dysphagia   PHYSICAL EXAMINATION: General appearance: alert, cooperative and no distress Head: Normocephalic, without obvious abnormality, atraumatic Neck: no adenopathy, no JVD, supple, symmetrical, trachea midline and thyroid not enlarged, symmetric, no tenderness/mass/nodules Lymph nodes: Cervical, supraclavicular, and axillary nodes normal. Resp: clear to auscultation bilaterally Back: symmetric, no curvature. ROM normal. No CVA tenderness. Cardio: regular rate and rhythm, S1, S2 normal, no murmur, click, rub or gallop GI: soft, non-tender; bowel sounds normal; no masses,  no organomegaly Extremities: extremities normal, atraumatic, no cyanosis or edema  ECOG PERFORMANCE STATUS: 1 - Symptomatic but completely ambulatory  Blood pressure 102/72, pulse 122, temperature 98.2 F (36.8 C), temperature source Oral, resp. rate 17, height '5\' 11"'$  (1.803 m), weight 131 lb 3.2 oz (59.512 kg), SpO2 100 %.  LABORATORY DATA: Lab Results  Component Value Date   WBC 8.2 07/14/2015   HGB 10.8* 07/14/2015   HCT 34.0* 07/14/2015   MCV 85.1 07/14/2015   PLT 224 07/14/2015      Chemistry      Component Value Date/Time   NA 139 07/14/2015 0912   NA 138 01/26/2015 1454   K 4.1 07/14/2015 0912   K 3.7 01/26/2015 1454   CL 101 01/26/2015 1454   CO2 27 07/14/2015 0912   CO2 28 01/26/2015 1454   BUN 12.3 07/14/2015 0912   BUN 9 01/26/2015 1454   CREATININE 1.0 07/14/2015 0912   CREATININE 0.95 01/26/2015 1454      Component Value Date/Time   CALCIUM 9.6 07/14/2015 0912   CALCIUM 9.1 01/26/2015 1454   ALKPHOS 71 07/14/2015 0912   ALKPHOS 52 01/26/2015 1454   AST 16 07/14/2015 0912    AST 21 01/26/2015 1454   ALT 9 07/14/2015 0912   ALT 11* 01/26/2015 1454   BILITOT <0.30 07/14/2015 0912   BILITOT <0.1* 01/26/2015 1454       RADIOGRAPHIC STUDIES: No results found.  ASSESSMENT AND PLAN: This is a very pleasant 70 years old African-American male with a stage IIIA non-small cell lung cancer and started on concurrent chemoradiation with weekly carboplatin and paclitaxel. Status post 6 week of treatment. He tolerated his treatment well. He has been off treatment for the last few weeks. The recent CT scan of the chest showed partial improvement of his disease. He is currently undergoing consolidation chemotherapy with reduced dose carboplatin and paclitaxel is status post 2 cycles. He tolerated the second cycle well. I recommended for the patient to proceed with cycle #3 today as a scheduled. I will arrange for the patient to have repeat CT scan of the chest in 3 weeks for restaging of his disease. He would come back  for follow-up visit at that time. For smoke cessation, I strongly encouraged the patient to quit smoking. The patient was advised to call immediately if he has any concerning symptoms in the interval. The patient voices understanding of current disease status and treatment options and is in agreement with the current care plan.  All questions were answered. The patient knows to call the clinic with any problems, questions or concerns. We can certainly see the patient much sooner if necessary.  Disclaimer: This note was dictated with voice recognition software. Similar sounding words can inadvertently be transcribed and may not be corrected upon review.

## 2015-07-15 NOTE — Patient Instructions (Signed)
[facility] Discharge Instructions for Patients Receiving Chemotherapy  Today you received the following chemotherapy agents Taxol/Carboplatin.  To help prevent nausea and vomiting after your treatment, we encourage you to take your nausea medication as directed.   If you develop nausea and vomiting that is not controlled by your nausea medication, call the clinic.   BELOW ARE SYMPTOMS THAT SHOULD BE REPORTED IMMEDIATELY:  *FEVER GREATER THAN 100.5 F  *CHILLS WITH OR WITHOUT FEVER  NAUSEA AND VOMITING THAT IS NOT CONTROLLED WITH YOUR NAUSEA MEDICATION  *UNUSUAL SHORTNESS OF BREATH  *UNUSUAL BRUISING OR BLEEDING  TENDERNESS IN MOUTH AND THROAT WITH OR WITHOUT PRESENCE OF ULCERS  *URINARY PROBLEMS  *BOWEL PROBLEMS  UNUSUAL RASH Items with * indicate a potential emergency and should be followed up as soon as possible.  Feel free to call the clinic you have any questions or concerns. The clinic phone number is [phone].  Please show the [facility] at check-in to the Emergency Department and triage nurse.

## 2015-07-15 NOTE — Progress Notes (Signed)
Nutrition follow-up completed with patient during infusion for lung cancer. Weight decreased and documented as 131.2 pounds March 9 decreased from 135.3 pounds February 15. Patient denies nutrition impact symptoms. Reports drinking oral nutrition supplements.  Nutrition diagnosis: Food and nutrition related knowledge deficit resolved.  Provided coupons for oral nutrition supplements and recommended patient continue these between meals. Encouraged patient to contact me with questions or concerns.

## 2015-07-15 NOTE — Telephone Encounter (Signed)
per pof to sch pt appt-[facility] to call pt to sch scan-pt to get avs in trmt room b4 leaving trmt

## 2015-07-16 ENCOUNTER — Telehealth: Payer: Self-pay | Admitting: *Deleted

## 2015-07-16 ENCOUNTER — Ambulatory Visit: Payer: Medicare Other

## 2015-07-16 NOTE — Telephone Encounter (Signed)
Received call from wife: "I don't know what this weather is going to do, so can we come in earlier for his shot?" Patient had chemo 3/9, so I assured her this would be fine to do. They prefer 9am; appt changed, wife and patient appreciative.

## 2015-07-17 ENCOUNTER — Ambulatory Visit (HOSPITAL_BASED_OUTPATIENT_CLINIC_OR_DEPARTMENT_OTHER): Payer: Medicare Other

## 2015-07-17 ENCOUNTER — Ambulatory Visit: Payer: Medicare Other

## 2015-07-17 VITALS — BP 116/61 | HR 121 | Temp 98.1°F | Resp 22

## 2015-07-17 DIAGNOSIS — C3491 Malignant neoplasm of unspecified part of right bronchus or lung: Secondary | ICD-10-CM

## 2015-07-17 DIAGNOSIS — C3411 Malignant neoplasm of upper lobe, right bronchus or lung: Secondary | ICD-10-CM

## 2015-07-17 MED ORDER — PEGFILGRASTIM INJECTION 6 MG/0.6ML ~~LOC~~
6.0000 mg | PREFILLED_SYRINGE | Freq: Once | SUBCUTANEOUS | Status: AC
  Administered 2015-07-17: 6 mg via SUBCUTANEOUS

## 2015-07-20 ENCOUNTER — Ambulatory Visit: Payer: Medicare Other | Admitting: Internal Medicine

## 2015-07-21 ENCOUNTER — Ambulatory Visit: Payer: Medicare Other | Admitting: Podiatry

## 2015-07-28 ENCOUNTER — Other Ambulatory Visit (HOSPITAL_BASED_OUTPATIENT_CLINIC_OR_DEPARTMENT_OTHER): Payer: Medicare Other

## 2015-07-28 ENCOUNTER — Telehealth: Payer: Self-pay | Admitting: *Deleted

## 2015-07-28 DIAGNOSIS — C3411 Malignant neoplasm of upper lobe, right bronchus or lung: Secondary | ICD-10-CM

## 2015-07-28 DIAGNOSIS — Z5111 Encounter for antineoplastic chemotherapy: Secondary | ICD-10-CM

## 2015-07-28 LAB — COMPREHENSIVE METABOLIC PANEL
ALT: 10 U/L (ref 0–55)
AST: 17 U/L (ref 5–34)
Albumin: 3.6 g/dL (ref 3.5–5.0)
Alkaline Phosphatase: 81 U/L (ref 40–150)
Anion Gap: 10 mEq/L (ref 3–11)
BUN: 7.8 mg/dL (ref 7.0–26.0)
CO2: 27 mEq/L (ref 22–29)
Calcium: 9.3 mg/dL (ref 8.4–10.4)
Chloride: 104 mEq/L (ref 98–109)
Creatinine: 1 mg/dL (ref 0.7–1.3)
EGFR: >90 mL/min/{1.73_m2} (ref >90)
Glucose: 97 mg/dl (ref 70–140)
Potassium: 3.9 mEq/L (ref 3.5–5.1)
Sodium: 141 mEq/L (ref 136–145)
Total Bilirubin: <0.3 mg/dL (ref 0.20–1.20)
Total Protein: 7.2 g/dL (ref 6.4–8.3)

## 2015-07-28 LAB — CBC WITH DIFFERENTIAL/PLATELET
BASO%: 0.3 % (ref 0.0–2.0)
Basophils Absolute: 0 10*3/uL (ref 0.0–0.1)
EOS%: 0.1 % (ref 0.0–7.0)
Eosinophils Absolute: 0 10*3/uL (ref 0.0–0.5)
HCT: 27.1 % — ABNORMAL LOW (ref 38.4–49.9)
HGB: 8.4 g/dL — ABNORMAL LOW (ref 13.0–17.1)
LYMPH%: 9.2 % — ABNORMAL LOW (ref 14.0–49.0)
MCH: 27.7 pg (ref 27.2–33.4)
MCHC: 31.2 g/dL — ABNORMAL LOW (ref 32.0–36.0)
MCV: 88.8 fL (ref 79.3–98.0)
MONO#: 0.9 10*3/uL (ref 0.1–0.9)
MONO%: 7.1 % (ref 0.0–14.0)
NEUT#: 10.9 10*3/uL — ABNORMAL HIGH (ref 1.5–6.5)
NEUT%: 83.3 % — ABNORMAL HIGH (ref 39.0–75.0)
Platelets: 104 10*3/uL — ABNORMAL LOW (ref 140–400)
RBC: 3.05 10*6/uL — ABNORMAL LOW (ref 4.20–5.82)
RDW: 25.3 % — ABNORMAL HIGH (ref 11.0–14.6)
WBC: 13.1 10*3/uL — ABNORMAL HIGH (ref 4.0–10.3)
lymph#: 1.2 10*3/uL (ref 0.9–3.3)

## 2015-07-28 NOTE — Telephone Encounter (Signed)
Pt came in filled out a walk in form while getting labs done. Pt complains of a slight bloody discharge when blowing his nose. He also has been experiencing a little dizziness. Per Dr. Julien Nordmann pt is to increase fluids and use a humidifier at home. Pt advised in lobby and understands to call this clinic if symptoms fail to improve. Pt given a copy of completed lab results today.

## 2015-08-04 ENCOUNTER — Other Ambulatory Visit: Payer: Self-pay | Admitting: Internal Medicine

## 2015-08-04 ENCOUNTER — Other Ambulatory Visit: Payer: Self-pay | Admitting: *Deleted

## 2015-08-04 ENCOUNTER — Ambulatory Visit: Payer: Medicare Other

## 2015-08-04 ENCOUNTER — Encounter: Payer: Medicare Other | Admitting: Nutrition

## 2015-08-04 ENCOUNTER — Other Ambulatory Visit (HOSPITAL_BASED_OUTPATIENT_CLINIC_OR_DEPARTMENT_OTHER): Payer: Medicare Other

## 2015-08-04 DIAGNOSIS — C3411 Malignant neoplasm of upper lobe, right bronchus or lung: Secondary | ICD-10-CM

## 2015-08-04 DIAGNOSIS — C3491 Malignant neoplasm of unspecified part of right bronchus or lung: Secondary | ICD-10-CM

## 2015-08-04 LAB — COMPREHENSIVE METABOLIC PANEL
ALT: 9 U/L (ref 0–55)
AST: 16 U/L (ref 5–34)
Albumin: 3.5 g/dL (ref 3.5–5.0)
Alkaline Phosphatase: 60 U/L (ref 40–150)
Anion Gap: 10 mEq/L (ref 3–11)
BUN: 8.6 mg/dL (ref 7.0–26.0)
CO2: 26 mEq/L (ref 22–29)
Calcium: 9.2 mg/dL (ref 8.4–10.4)
Chloride: 104 mEq/L (ref 98–109)
Creatinine: 0.9 mg/dL (ref 0.7–1.3)
EGFR: >90 mL/min/{1.73_m2} (ref >90)
Glucose: 87 mg/dl (ref 70–140)
Potassium: 3.8 mEq/L (ref 3.5–5.1)
Sodium: 139 mEq/L (ref 136–145)
Total Bilirubin: <0.3 mg/dL (ref 0.20–1.20)
Total Protein: 7.1 g/dL (ref 6.4–8.3)

## 2015-08-04 LAB — CBC WITH DIFFERENTIAL/PLATELET
BASO%: 0.4 % (ref 0.0–2.0)
Basophils Absolute: 0 10*3/uL (ref 0.0–0.1)
EOS%: 0.3 % (ref 0.0–7.0)
Eosinophils Absolute: 0 10*3/uL (ref 0.0–0.5)
HCT: 28.6 % — ABNORMAL LOW (ref 38.4–49.9)
HGB: 9.1 g/dL — ABNORMAL LOW (ref 13.0–17.1)
LYMPH%: 14.6 % (ref 14.0–49.0)
MCH: 28.8 pg (ref 27.2–33.4)
MCHC: 31.9 g/dL — ABNORMAL LOW (ref 32.0–36.0)
MCV: 90.3 fL (ref 79.3–98.0)
MONO#: 0.7 10*3/uL (ref 0.1–0.9)
MONO%: 10.6 % (ref 0.0–14.0)
NEUT#: 4.8 10*3/uL (ref 1.5–6.5)
NEUT%: 74.1 % (ref 39.0–75.0)
Platelets: 148 10*3/uL (ref 140–400)
RBC: 3.16 10*6/uL — ABNORMAL LOW (ref 4.20–5.82)
RDW: 26.8 % — ABNORMAL HIGH (ref 11.0–14.6)
WBC: 6.4 10*3/uL (ref 4.0–10.3)
lymph#: 0.9 10*3/uL (ref 0.9–3.3)

## 2015-08-05 ENCOUNTER — Ambulatory Visit (HOSPITAL_COMMUNITY)
Admission: RE | Admit: 2015-08-05 | Discharge: 2015-08-05 | Disposition: A | Discharge status: Home / Self Care | Payer: Medicare Other | Source: Ambulatory Visit | Attending: Internal Medicine | Admitting: Internal Medicine

## 2015-08-05 ENCOUNTER — Encounter (HOSPITAL_COMMUNITY): Payer: Self-pay

## 2015-08-05 DIAGNOSIS — C3411 Malignant neoplasm of upper lobe, right bronchus or lung: Secondary | ICD-10-CM | POA: Insufficient documentation

## 2015-08-05 DIAGNOSIS — R918 Other nonspecific abnormal finding of lung field: Secondary | ICD-10-CM | POA: Insufficient documentation

## 2015-08-05 DIAGNOSIS — Z5111 Encounter for antineoplastic chemotherapy: Secondary | ICD-10-CM | POA: Diagnosis present

## 2015-08-05 MED ORDER — IOPAMIDOL (ISOVUE-300) INJECTION 61%
75.0000 mL | Freq: Once | INTRAVENOUS | Status: AC | PRN
  Administered 2015-08-05: 75 mL via INTRAVENOUS

## 2015-08-12 ENCOUNTER — Ambulatory Visit (HOSPITAL_BASED_OUTPATIENT_CLINIC_OR_DEPARTMENT_OTHER): Payer: Medicare Other | Admitting: Internal Medicine

## 2015-08-12 ENCOUNTER — Telehealth: Payer: Self-pay | Admitting: Internal Medicine

## 2015-08-12 ENCOUNTER — Encounter: Payer: Self-pay | Admitting: Internal Medicine

## 2015-08-12 ENCOUNTER — Other Ambulatory Visit (HOSPITAL_BASED_OUTPATIENT_CLINIC_OR_DEPARTMENT_OTHER): Payer: Medicare Other

## 2015-08-12 VITALS — BP 127/67 | HR 110 | Temp 97.6°F | Resp 18 | Ht 71.0 in | Wt 130.7 lb

## 2015-08-12 DIAGNOSIS — Z720 Tobacco use: Secondary | ICD-10-CM | POA: Diagnosis not present

## 2015-08-12 DIAGNOSIS — C3411 Malignant neoplasm of upper lobe, right bronchus or lung: Secondary | ICD-10-CM

## 2015-08-12 DIAGNOSIS — F17200 Nicotine dependence, unspecified, uncomplicated: Secondary | ICD-10-CM

## 2015-08-12 DIAGNOSIS — C3491 Malignant neoplasm of unspecified part of right bronchus or lung: Secondary | ICD-10-CM

## 2015-08-12 LAB — COMPREHENSIVE METABOLIC PANEL
ALT: <9 U/L (ref 0–55)
AST: 18 U/L (ref 5–34)
Albumin: 3.7 g/dL (ref 3.5–5.0)
Alkaline Phosphatase: 56 U/L (ref 40–150)
Anion Gap: 9 mEq/L (ref 3–11)
BUN: 10 mg/dL (ref 7.0–26.0)
CO2: 26 mEq/L (ref 22–29)
Calcium: 9.5 mg/dL (ref 8.4–10.4)
Chloride: 104 mEq/L (ref 98–109)
Creatinine: 0.9 mg/dL (ref 0.7–1.3)
EGFR: >90 mL/min/{1.73_m2} (ref >90)
Glucose: 96 mg/dl (ref 70–140)
Potassium: 4 mEq/L (ref 3.5–5.1)
Sodium: 139 mEq/L (ref 136–145)
Total Bilirubin: <0.3 mg/dL (ref 0.20–1.20)
Total Protein: 7.8 g/dL (ref 6.4–8.3)

## 2015-08-12 LAB — CBC WITH DIFFERENTIAL/PLATELET
BASO%: 0.3 % (ref 0.0–2.0)
Basophils Absolute: 0 10*3/uL (ref 0.0–0.1)
EOS%: 0.8 % (ref 0.0–7.0)
Eosinophils Absolute: 0 10*3/uL (ref 0.0–0.5)
HCT: 32.5 % — ABNORMAL LOW (ref 38.4–49.9)
HGB: 10.3 g/dL — ABNORMAL LOW (ref 13.0–17.1)
LYMPH%: 13.5 % — ABNORMAL LOW (ref 14.0–49.0)
MCH: 29.4 pg (ref 27.2–33.4)
MCHC: 31.7 g/dL — ABNORMAL LOW (ref 32.0–36.0)
MCV: 92.8 fL (ref 79.3–98.0)
MONO#: 0.6 10*3/uL (ref 0.1–0.9)
MONO%: 11.7 % (ref 0.0–14.0)
NEUT#: 3.7 10*3/uL (ref 1.5–6.5)
NEUT%: 73.7 % (ref 39.0–75.0)
Platelets: 170 10*3/uL (ref 140–400)
RBC: 3.5 10*6/uL — ABNORMAL LOW (ref 4.20–5.82)
RDW: 25.5 % — ABNORMAL HIGH (ref 11.0–14.6)
WBC: 5 10*3/uL (ref 4.0–10.3)
lymph#: 0.7 10*3/uL — ABNORMAL LOW (ref 0.9–3.3)

## 2015-08-12 NOTE — Progress Notes (Signed)
[facility] [phone]   [phone]  OFFICE PROGRESS NOTE  [name], FNP [address] [place] [address]  DIAGNOSIS: Stage IIIA (T2a, N2, M0) non-small cell lung cancer consistent with invasive squamous cell carcinoma presented with right upper lobe lung mass in addition to right hilar and mediastinal lymphadenopathy diagnosed in September 2016.  PRIOR THERAPY:  1) Course of concurrent chemoradiation with weekly carboplatin for AUC of 2 and paclitaxel 45 MG/M2. First dose 03/01/2015. He is status post 6 cycles. Last cycle was given 04/12/2015 was partial response. 2) Consolidation chemotherapy with reduced dose carboplatin for AUC of 5 and paclitaxel 175 MG/M2 every 3 weeks, status post 3 cycles.   CURRENT THERAPY: Observation.  INTERVAL HISTORY: Ivan Daniels 70 y.o. male returns to the clinic today for follow-up visit accompanied by his sister. The patient tolerated the last cycle of his consolidation chemotherapy fairly well. He has occasional dizzy spells secondary to poor by mouth intake and dehydration. He continues to have dry cough. He denied having any significant nausea or vomiting. He has no fever or chills. He denied having any significant chest pain, shortness of breath,or hemoptysis. He had repeat CT scan of the chest performed recently and he is here for evaluation and discussion of his scan results.  MEDICAL HISTORY: Past Medical History  Diagnosis Date  . Hyperlipidemia   . TOBACCO ABUSE 05/24/2009    Qualifier: Diagnosis of  By: Hassell Done FNP, Tori Milks    . Paranoid schizophrenia ([facility]) 10/31/2009    Qualifier: Diagnosis of  By: Jorene Minors, Scott    . BENIGN PROSTATIC HYPERTROPHY, HX OF 12/19/2006    Qualifier: Diagnosis of  By: Radene Ou MD, [place]    . Primary spontaneous pneumothorax, left 01/25/2015  . Lung mass 01/25/2015    3.7 x 3.6 cm RUL spiculated lung mass with 2.0 cm right paratracheal lymph node  suspicious for bronchogenic CA  . Full code status 02/10/2015  . Allergy   . ALCOHOL ABUSE 11/12/2009    Qualifier: Diagnosis of  By: Hassell Done FNP, Tori Milks    . Non-small cell lung cancer ([facility]) 01/27/15    non small-cell,squamous cell ca RUL    ALLERGIES:  is allergic to benztropine mesylate and chlorpromazine hcl.  MEDICATIONS:  Current Outpatient Prescriptions  Medication Sig Dispense Refill  . benztropine (COGENTIN) 0.5 MG tablet Take 0.5 mg by mouth at bedtime.     . chlorhexidine (PERIDEX) 0.12 % solution 1 mL by Mouth Rinse route See admin instructions.  1  . clopidogrel (PLAVIX) 75 MG tablet Take 75 mg by mouth daily.     . ferrous fumarate (HEMOCYTE - 106 MG FE) 325 (106 FE) MG TABS tablet Take 1 tablet by mouth daily.     . haloperidol (HALDOL) 5 MG tablet Take 5 mg by mouth at bedtime.     . lidocaine-prilocaine (EMLA) cream Apply 1 application topically as needed. 30 g 0  . Multiple Vitamins-Minerals (MULTIVITAMIN WITH MINERALS) tablet Take 1 tablet by mouth daily.    Marland Kitchen oxyCODONE (OXY IR/ROXICODONE) 5 MG immediate release tablet Take 1 tablet (5 mg total) by mouth every 3 (three) hours as needed for moderate pain. 30 tablet 0  . oxyCODONE 10 MG TABS Take 1 tablet (10 mg total) by mouth every 3 (three) hours as needed for severe pain. 30 tablet 0  . prochlorperazine (COMPAZINE) 10 MG tablet Take 1 tablet (10 mg total) by mouth every 6 (six) hours  as needed for nausea or vomiting. 30 tablet 0  . simvastatin (ZOCOR) 20 MG tablet Take 20 mg by mouth at bedtime.     . sucralfate (CARAFATE) 1 G tablet Take 1 tablet (1 g total) by mouth 4 (four) times daily. 120 tablet 2  . Wound Dressings (SONAFINE) Apply 1 application topically 3 (three) times daily.     No current facility-administered medications for this visit.    SURGICAL HISTORY:  Past Surgical History  Procedure Laterality Date  . Video bronchoscopy with endobronchial ultrasound N/A 01/27/2015    Procedure: VIDEO BRONCHOSCOPY  WITH ENDOBRONCHIAL ULTRASOUND;  Surgeon: Grace Isaac, MD;  Location: [facility];  Service: Thoracic;  Laterality: N/A;  . Lung biopsy Right 01/27/2015    Procedure: Right Upper Lobe Bronchus BIOPSY;  Surgeon: Grace Isaac, MD;  Location: [facility];  Service: Thoracic;  Laterality: Right;    REVIEW OF SYSTEMS:  Constitutional: positive for fatigue Eyes: negative Ears, nose, mouth, throat, and face: negative Respiratory: positive for cough and dyspnea on exertion Cardiovascular: negative Gastrointestinal: negative Genitourinary:negative Integument/breast: negative Hematologic/lymphatic: negative Musculoskeletal:negative Neurological: negative Behavioral/Psych: negative Endocrine: negative Allergic/Immunologic: negative   PHYSICAL EXAMINATION: General appearance: alert, cooperative and no distress Head: Normocephalic, without obvious abnormality, atraumatic Neck: no adenopathy, no JVD, supple, symmetrical, trachea midline and thyroid not enlarged, symmetric, no tenderness/mass/nodules Lymph nodes: Cervical, supraclavicular, and axillary nodes normal. Resp: clear to auscultation bilaterally Back: symmetric, no curvature. ROM normal. No CVA tenderness. Cardio: regular rate and rhythm, S1, S2 normal, no murmur, click, rub or gallop GI: soft, non-tender; bowel sounds normal; no masses,  no organomegaly Extremities: extremities normal, atraumatic, no cyanosis or edema Neurologic: Alert and oriented X 3, normal strength and tone. Normal symmetric reflexes. Normal coordination and gait  ECOG PERFORMANCE STATUS: 1 - Symptomatic but completely ambulatory  Blood pressure 127/67, pulse 110, temperature 97.6 F (36.4 C), temperature source Oral, resp. rate 18, height '5\' 11"'$  (1.803 m), weight 130 lb 11.2 oz (59.285 kg), SpO2 100 %.  LABORATORY DATA: Lab Results  Component Value Date   WBC 5.0 08/12/2015   HGB 10.3* 08/12/2015   HCT 32.5* 08/12/2015   MCV 92.8 08/12/2015   PLT 170  08/12/2015      Chemistry      Component Value Date/Time   NA 139 08/04/2015 0851   NA 138 01/26/2015 1454   K 3.8 08/04/2015 0851   K 3.7 01/26/2015 1454   CL 101 01/26/2015 1454   CO2 26 08/04/2015 0851   CO2 28 01/26/2015 1454   BUN 8.6 08/04/2015 0851   BUN 9 01/26/2015 1454   CREATININE 0.9 08/04/2015 0851   CREATININE 0.95 01/26/2015 1454      Component Value Date/Time   CALCIUM 9.2 08/04/2015 0851   CALCIUM 9.1 01/26/2015 1454   ALKPHOS 60 08/04/2015 0851   ALKPHOS 52 01/26/2015 1454   AST 16 08/04/2015 0851   AST 21 01/26/2015 1454   ALT 9 08/04/2015 0851   ALT 11* 01/26/2015 1454   BILITOT <0.30 08/04/2015 0851   BILITOT <0.1* 01/26/2015 1454       RADIOGRAPHIC STUDIES: Ct Chest W Contrast  08/05/2015  CLINICAL DATA:  Lung cancer diagnosed 2016 September. Chemotherapy ongoing. Radiation therapy complete. Subsequent treatment evaluation EXAM: CT CHEST WITH CONTRAST TECHNIQUE: Multidetector CT imaging of the chest was performed during intravenous contrast administration. CONTRAST:  93m ISOVUE-300 IOPAMIDOL (ISOVUE-300) INJECTION 61% COMPARISON:  CT 05/21/2015 FINDINGS: Mediastinum/Nodes: Port in the RIGHT anterior chest wall. No axillary or  supraclavicular lymphadenopathy. There is thickening of the esophagus throughout its course similar to comparison exam. No mass lesion identified. No central pulmonary embolism. No pericardial fluid. Lungs/Pleura: Field of ground-glass opacity in the RIGHT upper lobe and superior segment of the RIGHT lower lobe consists with radiation change. Centrally in the RIGHT suprahilar region nodular focus measures 2.3 by 1.9 cm (image 26, series 5) compared to 3.0 by 2.0 cm remeasured for some contraction of the lesion. Persistent obstruction of the RIGHT upper lobe bronchus anteriorly. The LEFT lung is mildly hyperexpanded.  No suspicious nodules. Upper abdomen: Mild thickening of the LEFT adrenal gland similar. No focal hepatic lesion on  limited view liver. Musculoskeletal: No aggressive osseous lesion. IMPRESSION: 1. Slight contraction of the nodular lesion in the RIGHT suprahilar lung. 2. Field of ground-glass opacity and airspace opacity in the RIGHT upper lobe and RIGHT lower lobe related to radiation change is stable. 3. No mediastinal lymphadenopathy. 4. Diffuse esophageal wall thickening is similar prior likely related to radiation. Electronically Signed   By: Suzy Bouchard M.D.   On: 08/05/2015 09:55    ASSESSMENT AND PLAN: This is a very pleasant 70 years old African-American male with a stage IIIA non-small cell lung cancer and started on concurrent chemoradiation with weekly carboplatin and paclitaxel. Status post 6 week of treatment. He tolerated his treatment well. He has been off treatment for the last few weeks. This was followed by consolidation chemotherapy with reduced dose carboplatin and paclitaxel is status post 3 cycles. He tolerated his systemic chemotherapy fairly well. The recent CT scan of the chest showed no evidence for disease progression but slight contraction of the nodular lesion in the right suprahilar lung. I discussed the scan results with the patient and his sister. I recommended for him to continue on observation for now with repeat CT scan of the chest in 3 months. For smoke cessation, I strongly encouraged the patient to quit smoking. The patient was advised to call immediately if he has any concerning symptoms in the interval. The patient voices understanding of current disease status and treatment options and is in agreement with the current care plan.  All questions were answered. The patient knows to call the clinic with any problems, questions or concerns. We can certainly see the patient much sooner if necessary.  Disclaimer: This note was dictated with voice recognition software. Similar sounding words can inadvertently be transcribed and may not be corrected upon review.

## 2015-08-12 NOTE — Telephone Encounter (Signed)
per pf to sch pt appt-[facility] will call to sch scan-gave copy of avs

## 2015-08-18 ENCOUNTER — Other Ambulatory Visit (HOSPITAL_BASED_OUTPATIENT_CLINIC_OR_DEPARTMENT_OTHER): Payer: Medicare Other

## 2015-08-18 DIAGNOSIS — C3411 Malignant neoplasm of upper lobe, right bronchus or lung: Secondary | ICD-10-CM

## 2015-08-18 DIAGNOSIS — C3491 Malignant neoplasm of unspecified part of right bronchus or lung: Secondary | ICD-10-CM

## 2015-08-18 LAB — COMPREHENSIVE METABOLIC PANEL
ALT: <9 U/L (ref 0–55)
AST: 17 U/L (ref 5–34)
Albumin: 3.6 g/dL (ref 3.5–5.0)
Alkaline Phosphatase: 56 U/L (ref 40–150)
Anion Gap: 8 mEq/L (ref 3–11)
BUN: 8 mg/dL (ref 7.0–26.0)
CO2: 28 mEq/L (ref 22–29)
Calcium: 9.4 mg/dL (ref 8.4–10.4)
Chloride: 104 mEq/L (ref 98–109)
Creatinine: 1 mg/dL (ref 0.7–1.3)
EGFR: >90 mL/min/{1.73_m2} (ref >90)
Glucose: 91 mg/dl (ref 70–140)
Potassium: 4.1 mEq/L (ref 3.5–5.1)
Sodium: 141 mEq/L (ref 136–145)
Total Bilirubin: <0.3 mg/dL (ref 0.20–1.20)
Total Protein: 7.5 g/dL (ref 6.4–8.3)

## 2015-08-18 LAB — CBC WITH DIFFERENTIAL/PLATELET
BASO%: 0.7 % (ref 0.0–2.0)
Basophils Absolute: 0 10*3/uL (ref 0.0–0.1)
EOS%: 1.4 % (ref 0.0–7.0)
Eosinophils Absolute: 0.1 10*3/uL (ref 0.0–0.5)
HCT: 33.5 % — ABNORMAL LOW (ref 38.4–49.9)
HGB: 10.5 g/dL — ABNORMAL LOW (ref 13.0–17.1)
LYMPH%: 18.3 % (ref 14.0–49.0)
MCH: 29 pg (ref 27.2–33.4)
MCHC: 31.5 g/dL — ABNORMAL LOW (ref 32.0–36.0)
MCV: 92 fL (ref 79.3–98.0)
MONO#: 0.6 10*3/uL (ref 0.1–0.9)
MONO%: 11.5 % (ref 0.0–14.0)
NEUT#: 3.4 10*3/uL (ref 1.5–6.5)
NEUT%: 68.1 % (ref 39.0–75.0)
Platelets: 204 10*3/uL (ref 140–400)
RBC: 3.64 10*6/uL — ABNORMAL LOW (ref 4.20–5.82)
RDW: 23.4 % — ABNORMAL HIGH (ref 11.0–14.6)
WBC: 5 10*3/uL (ref 4.0–10.3)
lymph#: 0.9 10*3/uL (ref 0.9–3.3)

## 2015-08-25 ENCOUNTER — Other Ambulatory Visit: Payer: Self-pay | Admitting: Radiation Oncology

## 2015-08-25 ENCOUNTER — Other Ambulatory Visit (HOSPITAL_BASED_OUTPATIENT_CLINIC_OR_DEPARTMENT_OTHER): Payer: Medicare Other

## 2015-08-25 DIAGNOSIS — C3411 Malignant neoplasm of upper lobe, right bronchus or lung: Secondary | ICD-10-CM | POA: Diagnosis not present

## 2015-08-25 DIAGNOSIS — C3491 Malignant neoplasm of unspecified part of right bronchus or lung: Secondary | ICD-10-CM

## 2015-08-25 LAB — CBC WITH DIFFERENTIAL/PLATELET
BASO%: 0.2 % (ref 0.0–2.0)
Basophils Absolute: 0 10*3/uL (ref 0.0–0.1)
EOS%: 1.2 % (ref 0.0–7.0)
Eosinophils Absolute: 0.1 10*3/uL (ref 0.0–0.5)
HCT: 34.2 % — ABNORMAL LOW (ref 38.4–49.9)
HGB: 10.9 g/dL — ABNORMAL LOW (ref 13.0–17.1)
LYMPH%: 18.1 % (ref 14.0–49.0)
MCH: 29.5 pg (ref 27.2–33.4)
MCHC: 31.9 g/dL — ABNORMAL LOW (ref 32.0–36.0)
MCV: 92.7 fL (ref 79.3–98.0)
MONO#: 0.4 10*3/uL (ref 0.1–0.9)
MONO%: 10.3 % (ref 0.0–14.0)
NEUT#: 2.9 10*3/uL (ref 1.5–6.5)
NEUT%: 70.2 % (ref 39.0–75.0)
Platelets: 219 10*3/uL (ref 140–400)
RBC: 3.69 10*6/uL — ABNORMAL LOW (ref 4.20–5.82)
RDW: 19.9 % — ABNORMAL HIGH (ref 11.0–14.6)
WBC: 4.2 10*3/uL (ref 4.0–10.3)
lymph#: 0.8 10*3/uL — ABNORMAL LOW (ref 0.9–3.3)

## 2015-08-25 LAB — COMPREHENSIVE METABOLIC PANEL
ALT: <9 U/L (ref 0–55)
AST: 17 U/L (ref 5–34)
Albumin: 3.6 g/dL (ref 3.5–5.0)
Alkaline Phosphatase: 52 U/L (ref 40–150)
Anion Gap: 9 mEq/L (ref 3–11)
BUN: 6.6 mg/dL — ABNORMAL LOW (ref 7.0–26.0)
CO2: 26 mEq/L (ref 22–29)
Calcium: 9.5 mg/dL (ref 8.4–10.4)
Chloride: 106 mEq/L (ref 98–109)
Creatinine: 0.9 mg/dL (ref 0.7–1.3)
EGFR: >90 mL/min/{1.73_m2} (ref >90)
Glucose: 89 mg/dl (ref 70–140)
Potassium: 3.9 mEq/L (ref 3.5–5.1)
Sodium: 141 mEq/L (ref 136–145)
Total Bilirubin: <0.3 mg/dL (ref 0.20–1.20)
Total Protein: 7.3 g/dL (ref 6.4–8.3)

## 2015-09-16 ENCOUNTER — Ambulatory Visit (INDEPENDENT_AMBULATORY_CARE_PROVIDER_SITE_OTHER): Payer: Medicare Other | Admitting: Podiatry

## 2015-09-16 ENCOUNTER — Encounter: Payer: Self-pay | Admitting: Podiatry

## 2015-09-16 DIAGNOSIS — Q828 Other specified congenital malformations of skin: Secondary | ICD-10-CM | POA: Diagnosis not present

## 2015-09-16 DIAGNOSIS — M79673 Pain in unspecified foot: Secondary | ICD-10-CM

## 2015-09-16 DIAGNOSIS — S98139S Complete traumatic amputation of one unspecified lesser toe, sequela: Secondary | ICD-10-CM

## 2015-09-16 NOTE — Progress Notes (Signed)
Subjective:     Patient ID: Ivan Daniels, male   DOB: [birth year], 70 y.o.   MRN: [id number]  HPIThis patient presents to my office with painful areas on the bottom of both feet.  He says he lost his toes to frostbite and now develops callused areas on the bottom of both feet.   Review of Systems     Objective:   Physical Exam Objective: Review of past medical history, medications, social history and allergies were performed.  Vascular: Dorsalis pedis and posterior tibial pulses were palpable B/L, capillary refill was  WNL B/L, temperature gradient was WNL B/L   Skin:  No signs of symptoms of infection or ulcers on both feet.  Porokeratosis sub2 right foot and sub ,4 th left foot.   Sensory: Thornell Mule monifilament WNL   Orthopedic: Orthopedic evaluation demonstrates all joints distal t ankle have full ROM without crepitus, muscle power WNL B/L    Assessment:     Porokeratosis B/L     Plan:    Debridement of porokeratosis RTC 10 weeks   Gardiner Barefoot DPM

## 2015-10-13 ENCOUNTER — Telehealth: Payer: Self-pay | Admitting: *Deleted

## 2015-10-13 NOTE — Telephone Encounter (Signed)
Sister Earlie Server called requested a call back from nurse.   Spoke with Earlie Server and was informed that since Monday, pt developed numbness in all fingers.  Pt can still perform ADLs without difficulty.  Per sister,  Pt does not have pain associated with the numbness.  Dorothy wanted to know what Dr. Julien Nordmann would suggest. Dorothy's    Phone      [phone].

## 2015-10-15 NOTE — Telephone Encounter (Signed)
I left a message for Ivan Daniels to contact PCP as pt under observation -last chemo in march and numbness developed Monday.

## 2015-10-18 ENCOUNTER — Ambulatory Visit
Admission: RE | Admit: 2015-10-18 | Discharge: 2015-10-18 | Disposition: A | Discharge status: Home / Self Care | Payer: Medicare Other | Source: Ambulatory Visit | Attending: Radiation Oncology | Admitting: Radiation Oncology

## 2015-10-18 ENCOUNTER — Encounter: Payer: Self-pay | Admitting: Radiation Oncology

## 2015-10-18 VITALS — BP 112/67 | HR 97 | Temp 98.2°F | Resp 20 | Ht 71.0 in | Wt 130.6 lb

## 2015-10-18 DIAGNOSIS — C3411 Malignant neoplasm of upper lobe, right bronchus or lung: Secondary | ICD-10-CM

## 2015-10-18 NOTE — Progress Notes (Addendum)
Follow up s/p radiation RUL  Completed 03/01/15-06/15/2014 Last Ct  Scan 08/05/15, next CT chest scheduled 11/11/15 and f/u Dr. Julien Nordmann on 11/16/15 Patient denies any pain, nausea, dizzy ness,  Appetite good stated, energy  Level mild fatigue no sob, has non productive cough in am and evenings   BP 112/67 mmHg  Pulse 97  Temp(Src) 98.2 F (36.8 C) (Oral)  Resp 20  Ht '5\' 11"'$  (1.803 m)  Wt 130 lb 9.6 oz (59.24 kg)  BMI 18.22 kg/m2  SpO2 100%  Wt Readings from Last 3 Encounters:  10/18/15 130 lb 9.6 oz (59.24 kg)  08/12/15 130 lb 11.2 oz (59.285 kg)  07/15/15 131 lb 3.2 oz (59.512 kg)

## 2015-10-18 NOTE — Progress Notes (Signed)
Radiation Oncology         [phone] ________________________________  Name: Ivan Daniels MRN: [id number]  Date: 10/18/2015  DOB: [birth year]  Follow-Up Visit Note  CC: [name], FNP  Curt Bears, MD  Diagnosis: Non-small cell lung cancer [facility])   Staging form: Lung, AJCC 7th Edition     Clinical: Stage IIIA (T2a, N2, M0) - Signed by Curt Bears, MD on 02/10/2015       Staging comments: Squamous cell cancer  Interval Since Last Radiation: 7 months. Completed radiation 03/01/2015 - 04/15/2015 to the right lung to a dose of 60 Gy in 30 fractions of 2 Gy plus boost to a total dose of 66 Gy. This was given with concurrent chemotherapy.  Narrative:  The patient returns today for routine follow-up. He denies any pain, nausea, or dizziness. He mentions that he has a good appetite. He mentions he has fatigue. He mentions that he has some trouble swallowing and has to cut his medicine to swallow it. He denies heart burn. He denies pain in his chest. His wife mentions that he has some numbness on his fingers.   BP 112/67 mmHg  Pulse 97  Temp(Src) 98.2 F (36.8 C) (Oral)  Resp 20  Ht '5\' 11"'$  (1.803 m)  Wt 130 lb 9.6 oz (59.24 kg)  BMI 18.22 kg/m2  SpO2 100%  Wt Readings from Last 3 Encounters:  10/18/15 130 lb 9.6 oz (59.24 kg)  08/12/15 130 lb 11.2 oz (59.285 kg)  07/15/15 131 lb 3.2 oz (59.512 kg)                 ALLERGIES:  is allergic to benztropine mesylate and chlorpromazine hcl.  Meds: Current Outpatient Prescriptions  Medication Sig Dispense Refill  . benztropine (COGENTIN) 0.5 MG tablet Take 0.5 mg by mouth at bedtime.     . chlorhexidine (PERIDEX) 0.12 % solution 1 mL by Mouth Rinse route See admin instructions.  1  . clopidogrel (PLAVIX) 75 MG tablet Take 75 mg by mouth daily.     . ferrous fumarate (HEMOCYTE - 106 MG FE) 325 (106 FE) MG TABS tablet Take 1 tablet by mouth daily.     . haloperidol (HALDOL) 5 MG tablet Take 5 mg by mouth at bedtime.     .  lidocaine-prilocaine (EMLA) cream Apply 1 application topically as needed. 30 g 0  . Multiple Vitamins-Minerals (MULTIVITAMIN WITH MINERALS) tablet Take 1 tablet by mouth daily.    Marland Kitchen oxyCODONE (OXY IR/ROXICODONE) 5 MG immediate release tablet Take 1 tablet (5 mg total) by mouth every 3 (three) hours as needed for moderate pain. 30 tablet 0  . oxyCODONE 10 MG TABS Take 1 tablet (10 mg total) by mouth every 3 (three) hours as needed for severe pain. 30 tablet 0  . prochlorperazine (COMPAZINE) 10 MG tablet Take 1 tablet (10 mg total) by mouth every 6 (six) hours as needed for nausea or vomiting. 30 tablet 0  . simvastatin (ZOCOR) 20 MG tablet Take 20 mg by mouth at bedtime.     . sucralfate (CARAFATE) 1 G tablet Take 1 tablet (1 g total) by mouth 4 (four) times daily. 120 tablet 2  . Wound Dressings (SONAFINE) Apply 1 application topically 3 (three) times daily.     No current facility-administered medications for this encounter.    Physical Findings: The patient is in no acute distress. Patient is alert and oriented.  height is '5\' 11"'$  (1.803 m) and weight is 130 lb  9.6 oz (59.24 kg). His oral temperature is 98.2 F (36.8 C). His blood pressure is 112/67 and his pulse is 97. His respiration is 20 and oxygen saturation is 100%. .   The lungs are clear to auscultation. The heart has a regular rhythm and rate.  Lab Findings: Lab Results  Component Value Date   WBC 4.2 08/25/2015   HGB 10.9* 08/25/2015   HCT 34.2* 08/25/2015   MCV 92.7 08/25/2015   PLT 219 08/25/2015     Radiographic Findings: No results found.  Impression: The patient is doing satisfactorily at this time without any ongoing issues in terms of significant shortness of breath, esophagitis.  Plan:  He has a CT scan scheduled with Dr. Julien Nordmann 11/11/2015. He will follow up with me in 6 months.   ------------------------------------------------  Jodelle Gross, MD, PhD    This document serves as a record of services  personally performed by Kyung Rudd, MD. It was created on his behalf by  Lendon Collar, a trained medical scribe. The creation of this record is based on the scribe's personal observations and the provider's statements to them. This document has been checked and approved by the attending provider.

## 2015-10-19 ENCOUNTER — Encounter: Payer: Self-pay | Admitting: Radiation Oncology

## 2015-11-11 ENCOUNTER — Ambulatory Visit (HOSPITAL_COMMUNITY)
Admission: RE | Admit: 2015-11-11 | Discharge: 2015-11-11 | Disposition: A | Discharge status: Home / Self Care | Payer: Medicare Other | Source: Ambulatory Visit | Attending: Internal Medicine | Admitting: Internal Medicine

## 2015-11-11 ENCOUNTER — Other Ambulatory Visit (HOSPITAL_BASED_OUTPATIENT_CLINIC_OR_DEPARTMENT_OTHER): Payer: Medicare Other

## 2015-11-11 ENCOUNTER — Telehealth: Payer: Self-pay | Admitting: Internal Medicine

## 2015-11-11 ENCOUNTER — Encounter (HOSPITAL_COMMUNITY): Payer: Self-pay

## 2015-11-11 DIAGNOSIS — F17200 Nicotine dependence, unspecified, uncomplicated: Secondary | ICD-10-CM

## 2015-11-11 DIAGNOSIS — C3411 Malignant neoplasm of upper lobe, right bronchus or lung: Secondary | ICD-10-CM

## 2015-11-11 DIAGNOSIS — I700 Atherosclerosis of aorta: Secondary | ICD-10-CM | POA: Insufficient documentation

## 2015-11-11 DIAGNOSIS — J439 Emphysema, unspecified: Secondary | ICD-10-CM | POA: Insufficient documentation

## 2015-11-11 DIAGNOSIS — I2510 Atherosclerotic heart disease of native coronary artery without angina pectoris: Secondary | ICD-10-CM | POA: Diagnosis not present

## 2015-11-11 LAB — CBC WITH DIFFERENTIAL/PLATELET
BASO%: 0.3 % (ref 0.0–2.0)
Basophils Absolute: 0 10*3/uL (ref 0.0–0.1)
EOS%: 1.4 % (ref 0.0–7.0)
Eosinophils Absolute: 0.1 10*3/uL (ref 0.0–0.5)
HCT: 35.9 % — ABNORMAL LOW (ref 38.4–49.9)
HGB: 11.9 g/dL — ABNORMAL LOW (ref 13.0–17.1)
LYMPH%: 21.6 % (ref 14.0–49.0)
MCH: 29.5 pg (ref 27.2–33.4)
MCHC: 33.1 g/dL (ref 32.0–36.0)
MCV: 89.1 fL (ref 79.3–98.0)
MONO#: 0.5 10*3/uL (ref 0.1–0.9)
MONO%: 12.2 % (ref 0.0–14.0)
NEUT#: 2.4 10*3/uL (ref 1.5–6.5)
NEUT%: 64.5 % (ref 39.0–75.0)
Platelets: 198 10*3/uL (ref 140–400)
RBC: 4.03 10*6/uL — ABNORMAL LOW (ref 4.20–5.82)
RDW: 15.2 % — ABNORMAL HIGH (ref 11.0–14.6)
WBC: 3.7 10*3/uL — ABNORMAL LOW (ref 4.0–10.3)
lymph#: 0.8 10*3/uL — ABNORMAL LOW (ref 0.9–3.3)

## 2015-11-11 LAB — COMPREHENSIVE METABOLIC PANEL
ALT: 13 U/L (ref 0–55)
AST: 18 U/L (ref 5–34)
Albumin: 3.6 g/dL (ref 3.5–5.0)
Alkaline Phosphatase: 56 U/L (ref 40–150)
Anion Gap: 10 mEq/L (ref 3–11)
BUN: 8.9 mg/dL (ref 7.0–26.0)
CO2: 26 mEq/L (ref 22–29)
Calcium: 9.3 mg/dL (ref 8.4–10.4)
Chloride: 104 mEq/L (ref 98–109)
Creatinine: 0.9 mg/dL (ref 0.7–1.3)
EGFR: >90 mL/min/{1.73_m2} (ref >90)
Glucose: 95 mg/dl (ref 70–140)
Potassium: 3.8 mEq/L (ref 3.5–5.1)
Sodium: 140 mEq/L (ref 136–145)
Total Bilirubin: <0.3 mg/dL (ref 0.20–1.20)
Total Protein: 7.4 g/dL (ref 6.4–8.3)

## 2015-11-11 MED ORDER — IOPAMIDOL (ISOVUE-300) INJECTION 61%
75.0000 mL | Freq: Once | INTRAVENOUS | Status: AC | PRN
  Administered 2015-11-11: 75 mL via INTRAVENOUS

## 2015-11-11 NOTE — Telephone Encounter (Signed)
Lvm advising appt 7/11 moved to 7/25 @ 845am due to md pal.

## 2015-11-16 ENCOUNTER — Ambulatory Visit: Payer: Medicare Other | Admitting: Internal Medicine

## 2015-11-30 ENCOUNTER — Encounter: Payer: Self-pay | Admitting: Internal Medicine

## 2015-11-30 ENCOUNTER — Ambulatory Visit (HOSPITAL_BASED_OUTPATIENT_CLINIC_OR_DEPARTMENT_OTHER): Payer: Medicare Other | Admitting: Internal Medicine

## 2015-11-30 ENCOUNTER — Telehealth: Payer: Self-pay | Admitting: Internal Medicine

## 2015-11-30 VITALS — BP 120/68 | HR 98 | Temp 98.3°F | Resp 18 | Ht 71.0 in | Wt 131.3 lb

## 2015-11-30 DIAGNOSIS — Z720 Tobacco use: Secondary | ICD-10-CM | POA: Diagnosis not present

## 2015-11-30 DIAGNOSIS — C3411 Malignant neoplasm of upper lobe, right bronchus or lung: Secondary | ICD-10-CM | POA: Diagnosis present

## 2015-11-30 NOTE — Telephone Encounter (Signed)
Gave pt cal & avs °

## 2015-11-30 NOTE — Progress Notes (Signed)
[facility] [phone]) [phone]   [phone]) [phone]  OFFICE PROGRESS NOTE  [name], FNP [address] [place] [address]  DIAGNOSIS: Stage IIIA (T2a, N2, M0) non-small cell lung cancer consistent with invasive squamous cell carcinoma presented with right upper lobe lung mass in addition to right hilar and mediastinal lymphadenopathy diagnosed in September 2016.  PRIOR THERAPY:  1) Course of concurrent chemoradiation with weekly carboplatin for AUC of 2 and paclitaxel 45 MG/M2. First dose 03/01/2015. He is status post 6 cycles. Last cycle was given 04/12/2015 was partial response. 2) Consolidation chemotherapy with reduced dose carboplatin for AUC of 5 and paclitaxel 175 MG/M2 every 3 weeks, status post 3 cycles.   CURRENT THERAPY: Observation.  INTERVAL HISTORY: Ivan Daniels 70 y.o. male returns to the clinic today for three-month follow-up visit accompanied by his sister. He is feeling fine today was no specific complaints except for intermittent dry cough. He denied having any significant nausea or vomiting. He has no fever or chills. He denied having any significant chest pain, shortness of breath,or hemoptysis. He had repeat CT scan of the chest performed recently and he is here for evaluation and discussion of his scan results.  MEDICAL HISTORY: Past Medical History:  Diagnosis Date  . ALCOHOL ABUSE 11/12/2009   Qualifier: Diagnosis of  By: Hassell Done FNP, Tori Milks    . Allergy   . BENIGN PROSTATIC HYPERTROPHY, HX OF 12/19/2006   Qualifier: Diagnosis of  By: Radene Ou MD, [place]    . Full code status 02/10/2015  . Hyperlipidemia   . Lung mass 01/25/2015   3.7 x 3.6 cm RUL spiculated lung mass with 2.0 cm right paratracheal lymph node suspicious for bronchogenic CA  . Non-small cell lung cancer ([facility]) 01/27/15   non small-cell,squamous cell ca RUL  . Paranoid schizophrenia ([facility]) 10/31/2009   Qualifier: Diagnosis of  By: Jorene Minors, [place]    .  Primary spontaneous pneumothorax, left 01/25/2015  . TOBACCO ABUSE 05/24/2009   Qualifier: Diagnosis of  By: Hassell Done FNP, Nykedtra      ALLERGIES:  is allergic to benztropine mesylate and chlorpromazine hcl.  MEDICATIONS:  Current Outpatient Prescriptions  Medication Sig Dispense Refill  . benztropine (COGENTIN) 0.5 MG tablet Take 0.5 mg by mouth at bedtime.     . chlorhexidine (PERIDEX) 0.12 % solution 1 mL by Mouth Rinse route See admin instructions.  1  . clopidogrel (PLAVIX) 75 MG tablet Take 75 mg by mouth daily.     . ferrous fumarate (HEMOCYTE - 106 MG FE) 325 (106 FE) MG TABS tablet Take 1 tablet by mouth daily.     . haloperidol (HALDOL) 5 MG tablet Take 5 mg by mouth at bedtime.     . lidocaine-prilocaine (EMLA) cream Apply 1 application topically as needed. 30 g 0  . Multiple Vitamins-Minerals (MULTIVITAMIN WITH MINERALS) tablet Take 1 tablet by mouth daily.    Marland Kitchen oxyCODONE (OXY IR/ROXICODONE) 5 MG immediate release tablet Take 1 tablet (5 mg total) by mouth every 3 (three) hours as needed for moderate pain. 30 tablet 0  . oxyCODONE 10 MG TABS Take 1 tablet (10 mg total) by mouth every 3 (three) hours as needed for severe pain. 30 tablet 0  . prochlorperazine (COMPAZINE) 10 MG tablet Take 1 tablet (10 mg total) by mouth every 6 (six) hours as needed for nausea or vomiting. 30 tablet 0  . simvastatin (ZOCOR) 20 MG tablet Take 20 mg by mouth at  bedtime.     . sucralfate (CARAFATE) 1 G tablet Take 1 tablet (1 g total) by mouth 4 (four) times daily. 120 tablet 2  . Wound Dressings (SONAFINE) Apply 1 application topically 3 (three) times daily.     No current facility-administered medications for this visit.     SURGICAL HISTORY:  Past Surgical History:  Procedure Laterality Date  . LUNG BIOPSY Right 01/27/2015   Procedure: Right Upper Lobe Bronchus BIOPSY;  Surgeon: Grace Isaac, MD;  Location: [facility];  Service: Thoracic;  Laterality: Right;  Marland Kitchen VIDEO BRONCHOSCOPY WITH  ENDOBRONCHIAL ULTRASOUND N/A 01/27/2015   Procedure: VIDEO BRONCHOSCOPY WITH ENDOBRONCHIAL ULTRASOUND;  Surgeon: Grace Isaac, MD;  Location: [facility];  Service: Thoracic;  Laterality: N/A;    REVIEW OF SYSTEMS:  A comprehensive review of systems was negative except for: Respiratory: positive for cough   PHYSICAL EXAMINATION: General appearance: alert, cooperative and no distress Head: Normocephalic, without obvious abnormality, atraumatic Neck: no adenopathy, no JVD, supple, symmetrical, trachea midline and thyroid not enlarged, symmetric, no tenderness/mass/nodules Lymph nodes: Cervical, supraclavicular, and axillary nodes normal. Resp: clear to auscultation bilaterally Back: symmetric, no curvature. ROM normal. No CVA tenderness. Cardio: regular rate and rhythm, S1, S2 normal, no murmur, click, rub or gallop GI: soft, non-tender; bowel sounds normal; no masses,  no organomegaly Extremities: extremities normal, atraumatic, no cyanosis or edema Neurologic: Alert and oriented X 3, normal strength and tone. Normal symmetric reflexes. Normal coordination and gait  ECOG PERFORMANCE STATUS: 1 - Symptomatic but completely ambulatory  Blood pressure 120/68, pulse 98, temperature 98.3 F (36.8 C), temperature source Oral, resp. rate 18, height '5\' 11"'$  (1.803 m), weight 131 lb 4.8 oz (59.6 kg), SpO2 100 %.  LABORATORY DATA: Lab Results  Component Value Date   WBC 3.7 (L) 11/11/2015   HGB 11.9 (L) 11/11/2015   HCT 35.9 (L) 11/11/2015   MCV 89.1 11/11/2015   PLT 198 11/11/2015      Chemistry      Component Value Date/Time   NA 140 11/11/2015 0749   K 3.8 11/11/2015 0749   CL 101 01/26/2015 1454   CO2 26 11/11/2015 0749   BUN 8.9 11/11/2015 0749   CREATININE 0.9 11/11/2015 0749      Component Value Date/Time   CALCIUM 9.3 11/11/2015 0749   ALKPHOS 56 11/11/2015 0749   AST 18 11/11/2015 0749   ALT 13 11/11/2015 0749   BILITOT <0.30 11/11/2015 0749       RADIOGRAPHIC  STUDIES: Ct Chest W Contrast  Result Date: 11/11/2015 CLINICAL DATA:  70 year old male with history of stage IIIA non-small cell lung cancer (invasive squamous cell carcinoma) originally diagnosed in September 2016, status post radiation therapy (now complete) undergoing chemotherapy at this time. Follow-up study. EXAM: CT CHEST WITH CONTRAST TECHNIQUE: Multidetector CT imaging of the chest was performed during intravenous contrast administration. CONTRAST:  1m ISOVUE-300 IOPAMIDOL (ISOVUE-300) INJECTION 61% COMPARISON:  Chest CT 08/05/2015, and multiple other prior examinations. FINDINGS: Mediastinum/Lymph Nodes: Heart size is normal. Small amount of pericardial fluid and/or thickening anteriorly, unlikely to be of any hemodynamic significance at this time. No associated pericardial calcification. There is aortic atherosclerosis, as well as atherosclerosis of the great vessels of the mediastinum and the coronary arteries, including calcified atherosclerotic plaque in the left main, left anterior descending, left circumflex and right coronary arteries. Borderline enlarged prevascular lymph node immediately anterior to the ascending thoracic aorta measuring 9 mm in short axis, similar to the prior examination. Several borderline enlarged  subcarinal lymph nodes are noted measuring up to 8 mm in short axis (nonspecific). Soft tissue fullness in and around the right hilar region may represent residual lymphatic tissue, although this is difficult to assess in the setting of postradiation changes. Esophagus is unremarkable in appearance. No axillary lymphadenopathy. Right internal jugular single-lumen porta cath with tip terminating in the right atrium. Lungs/Pleura: Previously noted suprahilar mass in the right upper lobe is now obscured by evolving surrounding postradiation changes. 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 Small right pleural effusion loculated in the posterior aspect of the upper right hemithorax. No definite new suspicious appearing pulmonary nodules or masses are noted elsewhere in the lungs. Mild to moderate paraseptal emphysema, most apparent throughout the lung apices. Upper Abdomen: Aortic atherosclerosis. Several calcified lymph nodes in the upper abdomen. There are again several sub cm low-attenuation lesions scattered throughout the liver, which appear similar in size, number and distribution to prior studies, too small to characterize, but favored to represent benign lesions such as tiny cysts. Bilateral adrenal glands are visualized and are normal in appearance. Musculoskeletal/Soft Tissues: There are no aggressive appearing lytic or blastic lesions noted in the visualized portions of the skeleton. IMPRESSION: 1. Evolving postradiation changes in the mid to upper right lung, without definitive evidence of residual or metastatic disease. 2. Borderline enlarged prevascular lymph node (9 mm) and the other prominent subcarinal lymph nodes appear similar to the prior study and remain nonspecific. 3. Aortic atherosclerosis, in addition to left main and 3 vessel coronary artery disease. Please note that although the presence of coronary artery calcium documents the presence of coronary artery disease, the severity of this disease and any potential stenosis cannot be assessed on this non-gated CT examination. Assessment for potential risk factor modification, dietary therapy or pharmacologic therapy may be warranted, if clinically indicated. 4. Emphysema. Electronically Signed   By: Vinnie Langton M.D.   On: 11/11/2015 09:28    ASSESSMENT AND PLAN: This is a very pleasant 70 years old African-American male with a stage IIIA non-small cell lung cancer and started on  concurrent chemoradiation with weekly carboplatin and paclitaxel. Status post 6 week of treatment. He tolerated his treatment well. He has been off treatment for the last few weeks. This was followed by consolidation chemotherapy with reduced dose carboplatin and paclitaxel is status post 3 cycles. He tolerated his systemic chemotherapy fairly well. The patient has been observation since completion of his consolidation chemotherapy. The recent CT scan of the chest showed no evidence for disease progression. I discussed the scan results with the patient and his sister. I recommended for him to continue on observation for now with repeat CT scan of the chest in 3 months. For smoke cessation, I strongly encouraged the patient to quit smoking. The patient was advised to call immediately if he has any concerning symptoms in the interval. The patient voices understanding of current disease status and treatment options and is in agreement with the current care plan.  All questions were answered. The patient knows to call the clinic with any problems, questions or concerns. We can certainly see the patient much sooner if necessary.  Disclaimer: This note was dictated with voice recognition software. Similar sounding words can inadvertently be transcribed and may not be corrected upon review.

## 2015-12-15 ENCOUNTER — Encounter: Payer: Self-pay | Admitting: Podiatry

## 2015-12-15 ENCOUNTER — Ambulatory Visit (INDEPENDENT_AMBULATORY_CARE_PROVIDER_SITE_OTHER): Payer: Medicare Other | Admitting: Podiatry

## 2015-12-15 DIAGNOSIS — Q828 Other specified congenital malformations of skin: Secondary | ICD-10-CM

## 2015-12-15 DIAGNOSIS — S98139S Complete traumatic amputation of one unspecified lesser toe, sequela: Secondary | ICD-10-CM

## 2015-12-15 DIAGNOSIS — M79673 Pain in unspecified foot: Secondary | ICD-10-CM

## 2015-12-15 NOTE — Progress Notes (Signed)
Subjective:     Patient ID: Ivan Daniels, male   DOB: February 10, 1946, 70 y.o.   MRN: [id number]  HPIThis patient presents to my office with painful areas on the bottom of both feet.  He says he lost his toes to frostbite and now develops callused areas on the bottom of both feet.   Review of Systems     Objective:   Physical Exam Objective: Review of past medical history, medications, social history and allergies were performed.  Vascular: Dorsalis pedis and posterior tibial pulses were palpable B/L, capillary refill was  WNL B/L, temperature gradient was WNL B/L   Skin:  No signs of symptoms of infection or ulcers on both feet.  Porokeratosis sub2 right foot and sub ,4 th left foot.   Sensory: Thornell Mule monifilament WNL   Orthopedic: Orthopedic evaluation demonstrates all joints distal t ankle have full ROM without crepitus, muscle power WNL B/L    Assessment:     Porokeratosis B/L     Plan:    Debridement of porokeratosis RTC 10 weeks   Gardiner Barefoot DPM

## 2016-02-23 ENCOUNTER — Encounter: Payer: Self-pay | Admitting: Podiatry

## 2016-02-23 ENCOUNTER — Ambulatory Visit (INDEPENDENT_AMBULATORY_CARE_PROVIDER_SITE_OTHER): Payer: Medicare Other | Admitting: Podiatry

## 2016-02-23 VITALS — BP 160/94 | HR 100 | Resp 14

## 2016-02-23 DIAGNOSIS — Q828 Other specified congenital malformations of skin: Secondary | ICD-10-CM | POA: Diagnosis not present

## 2016-02-23 DIAGNOSIS — S98139S Complete traumatic amputation of one unspecified lesser toe, sequela: Secondary | ICD-10-CM

## 2016-02-23 DIAGNOSIS — I739 Peripheral vascular disease, unspecified: Secondary | ICD-10-CM

## 2016-02-23 NOTE — Progress Notes (Signed)
Subjective:     Patient ID: Ivan Daniels, male   DOB: [birth year], 70 y.o.   MRN: [id number]  HPIThis patient presents to my office with painful areas on the bottom of both feet.  He says he lost his toes to frostbite and now develops callused areas on the bottom of both feet.   Review of Systems     Objective:   Physical Exam Objective: Review of past medical history, medications, social history and allergies were performed.  Vascular: Dorsalis pedis and posterior tibial pulses were palpable B/L, capillary refill was  WNL B/L, temperature gradient was WNL B/L   Skin:  No signs of symptoms of infection or ulcers on both feet.  Porokeratosis sub2 right foot and sub ,4 th left foot.   Sensory: Thornell Mule monifilament WNL   Orthopedic: Orthopedic evaluation demonstrates all joints distal t ankle have full ROM without crepitus, muscle power WNL B/L    Assessment:     Porokeratosis B/L     Plan:    Debridement of porokeratosis RTC 10 weeks   Gardiner Barefoot DPM

## 2016-02-28 ENCOUNTER — Other Ambulatory Visit (HOSPITAL_BASED_OUTPATIENT_CLINIC_OR_DEPARTMENT_OTHER): Payer: Medicare Other

## 2016-02-28 ENCOUNTER — Ambulatory Visit (HOSPITAL_COMMUNITY)
Admission: RE | Admit: 2016-02-28 | Discharge: 2016-02-28 | Disposition: A | Discharge status: Home / Self Care | Payer: Medicare Other | Source: Ambulatory Visit | Attending: Internal Medicine | Admitting: Internal Medicine

## 2016-02-28 ENCOUNTER — Encounter (HOSPITAL_COMMUNITY): Payer: Self-pay

## 2016-02-28 DIAGNOSIS — C3411 Malignant neoplasm of upper lobe, right bronchus or lung: Secondary | ICD-10-CM | POA: Insufficient documentation

## 2016-02-28 DIAGNOSIS — R918 Other nonspecific abnormal finding of lung field: Secondary | ICD-10-CM | POA: Insufficient documentation

## 2016-02-28 LAB — CBC WITH DIFFERENTIAL/PLATELET
BASO%: 0.4 % (ref 0.0–2.0)
Basophils Absolute: 0 10*3/uL (ref 0.0–0.1)
EOS%: 1.2 % (ref 0.0–7.0)
Eosinophils Absolute: 0 10*3/uL (ref 0.0–0.5)
HCT: 38.7 % (ref 38.4–49.9)
HGB: 12.6 g/dL — ABNORMAL LOW (ref 13.0–17.1)
LYMPH%: 27.9 % (ref 14.0–49.0)
MCH: 29.9 pg (ref 27.2–33.4)
MCHC: 32.5 g/dL (ref 32.0–36.0)
MCV: 91.9 fL (ref 79.3–98.0)
MONO#: 0.4 10*3/uL (ref 0.1–0.9)
MONO%: 10.4 % (ref 0.0–14.0)
NEUT#: 2.2 10*3/uL (ref 1.5–6.5)
NEUT%: 60.1 % (ref 39.0–75.0)
Platelets: 183 10*3/uL (ref 140–400)
RBC: 4.21 10*6/uL (ref 4.20–5.82)
RDW: 16.9 % — ABNORMAL HIGH (ref 11.0–14.6)
WBC: 3.6 10*3/uL — ABNORMAL LOW (ref 4.0–10.3)
lymph#: 1 10*3/uL (ref 0.9–3.3)

## 2016-02-28 LAB — COMPREHENSIVE METABOLIC PANEL
ALT: 9 U/L (ref 0–55)
AST: 18 U/L (ref 5–34)
Albumin: 3.7 g/dL (ref 3.5–5.0)
Alkaline Phosphatase: 54 U/L (ref 40–150)
Anion Gap: 7 mEq/L (ref 3–11)
BUN: 9.9 mg/dL (ref 7.0–26.0)
CO2: 29 mEq/L (ref 22–29)
Calcium: 9.4 mg/dL (ref 8.4–10.4)
Chloride: 104 mEq/L (ref 98–109)
Creatinine: 1.1 mg/dL (ref 0.7–1.3)
EGFR: 83 mL/min/{1.73_m2} — ABNORMAL LOW (ref >90)
Glucose: 92 mg/dl (ref 70–140)
Potassium: 4.5 mEq/L (ref 3.5–5.1)
Sodium: 140 mEq/L (ref 136–145)
Total Bilirubin: <0.22 mg/dL (ref 0.20–1.20)
Total Protein: 7.5 g/dL (ref 6.4–8.3)

## 2016-02-28 MED ORDER — IOPAMIDOL (ISOVUE-300) INJECTION 61%
75.0000 mL | Freq: Once | INTRAVENOUS | Status: AC | PRN
  Administered 2016-02-28: 75 mL via INTRAVENOUS

## 2016-03-06 ENCOUNTER — Ambulatory Visit (HOSPITAL_BASED_OUTPATIENT_CLINIC_OR_DEPARTMENT_OTHER): Payer: Medicare Other | Admitting: Internal Medicine

## 2016-03-06 ENCOUNTER — Encounter: Payer: Self-pay | Admitting: Internal Medicine

## 2016-03-06 ENCOUNTER — Telehealth: Payer: Self-pay | Admitting: Internal Medicine

## 2016-03-06 VITALS — BP 103/57 | HR 108 | Temp 98.0°F | Resp 18 | Ht 71.0 in | Wt 137.6 lb

## 2016-03-06 DIAGNOSIS — C3491 Malignant neoplasm of unspecified part of right bronchus or lung: Secondary | ICD-10-CM

## 2016-03-06 DIAGNOSIS — C3411 Malignant neoplasm of upper lobe, right bronchus or lung: Secondary | ICD-10-CM | POA: Diagnosis present

## 2016-03-06 NOTE — Progress Notes (Signed)
[facility] [phone]) [phone]   Fax:(336) [phone]  OFFICE PROGRESS NOTE  [name], FNP [address] [place] [address]  DIAGNOSIS: Stage IIIA (T2a, N2, M0) non-small cell lung cancer consistent with invasive squamous cell carcinoma presented with right upper lobe lung mass in addition to right hilar and mediastinal lymphadenopathy diagnosed in September 2016.  PRIOR THERAPY:  1) Course of concurrent chemoradiation with weekly carboplatin for AUC of 2 and paclitaxel 45 MG/M2. First dose 03/01/2015. He is status post 6 cycles. Last cycle was given 04/12/2015 was partial response. 2) Consolidation chemotherapy with reduced dose carboplatin for AUC of 5 and paclitaxel 175 MG/M2 every 3 weeks, status post 3 cycles.   CURRENT THERAPY: Observation.  INTERVAL HISTORY: Ivan Daniels 70 y.o. male returns to the clinic today for three-month follow-up visit accompanied by his sister. He is feeling fine today was no specific complaints except for dry cough. He denied having any significant nausea or vomiting. He has no fever or chills. He denied having any significant chest pain, shortness of breath,or hemoptysis. He had repeat CT scan of the chest performed recently and he is here for evaluation and discussion of his scan results. He quit smoking several months ago.  MEDICAL HISTORY: Past Medical History:  Diagnosis Date  . ALCOHOL ABUSE 11/12/2009   Qualifier: Diagnosis of  By: Hassell Done FNP, Tori Milks    . Allergy   . BENIGN PROSTATIC HYPERTROPHY, HX OF 12/19/2006   Qualifier: Diagnosis of  By: Radene Ou MD, [place]    . Full code status 02/10/2015  . Hyperlipidemia   . Lung mass 01/25/2015   3.7 x 3.6 cm RUL spiculated lung mass with 2.0 cm right paratracheal lymph node suspicious for bronchogenic CA  . Non-small cell lung cancer ([facility]) 01/27/15   non small-cell,squamous cell ca RUL  . Paranoid schizophrenia ([facility]) 10/31/2009   Qualifier: Diagnosis of  By:  Jorene Minors, [place]    . Primary spontaneous pneumothorax, left 01/25/2015  . TOBACCO ABUSE 05/24/2009   Qualifier: Diagnosis of  By: Hassell Done FNP, Nykedtra      ALLERGIES:  is allergic to benztropine mesylate and chlorpromazine hcl.  MEDICATIONS:  Current Outpatient Prescriptions  Medication Sig Dispense Refill  . benztropine (COGENTIN) 0.5 MG tablet Take 0.5 mg by mouth at bedtime.     . chlorhexidine (PERIDEX) 0.12 % solution 1 mL by Mouth Rinse route See admin instructions.  1  . clopidogrel (PLAVIX) 75 MG tablet Take 75 mg by mouth daily.     . ferrous fumarate (HEMOCYTE - 106 MG FE) 325 (106 FE) MG TABS tablet Take 1 tablet by mouth daily.     . haloperidol (HALDOL) 5 MG tablet Take 5 mg by mouth at bedtime.     . lidocaine-prilocaine (EMLA) cream Apply 1 application topically as needed. 30 g 0  . Multiple Vitamins-Minerals (MULTIVITAMIN WITH MINERALS) tablet Take 1 tablet by mouth daily.    Marland Kitchen oxyCODONE (OXY IR/ROXICODONE) 5 MG immediate release tablet Take 1 tablet (5 mg total) by mouth every 3 (three) hours as needed for moderate pain. 30 tablet 0  . oxyCODONE 10 MG TABS Take 1 tablet (10 mg total) by mouth every 3 (three) hours as needed for severe pain. 30 tablet 0  . prochlorperazine (COMPAZINE) 10 MG tablet Take 1 tablet (10 mg total) by mouth every 6 (six) hours as needed for nausea or vomiting. 30 tablet 0  . simvastatin (ZOCOR) 20 MG tablet Take  20 mg by mouth at bedtime.     . sucralfate (CARAFATE) 1 G tablet Take 1 tablet (1 g total) by mouth 4 (four) times daily. 120 tablet 2  . Wound Dressings (SONAFINE) Apply 1 application topically 3 (three) times daily.     No current facility-administered medications for this visit.     SURGICAL HISTORY:  Past Surgical History:  Procedure Laterality Date  . LUNG BIOPSY Right 01/27/2015   Procedure: Right Upper Lobe Bronchus BIOPSY;  Surgeon: Grace Isaac, MD;  Location: [facility];  Service: Thoracic;  Laterality: Right;  Marland Kitchen VIDEO  BRONCHOSCOPY WITH ENDOBRONCHIAL ULTRASOUND N/A 01/27/2015   Procedure: VIDEO BRONCHOSCOPY WITH ENDOBRONCHIAL ULTRASOUND;  Surgeon: Grace Isaac, MD;  Location: [facility];  Service: Thoracic;  Laterality: N/A;    REVIEW OF SYSTEMS:  A comprehensive review of systems was negative except for: Respiratory: positive for cough   PHYSICAL EXAMINATION: General appearance: alert, cooperative and no distress Head: Normocephalic, without obvious abnormality, atraumatic Neck: no adenopathy, no JVD, supple, symmetrical, trachea midline and thyroid not enlarged, symmetric, no tenderness/mass/nodules Lymph nodes: Cervical, supraclavicular, and axillary nodes normal. Resp: clear to auscultation bilaterally Back: symmetric, no curvature. ROM normal. No CVA tenderness. Cardio: regular rate and rhythm, S1, S2 normal, no murmur, click, rub or gallop GI: soft, non-tender; bowel sounds normal; no masses,  no organomegaly Extremities: extremities normal, atraumatic, no cyanosis or edema Neurologic: Alert and oriented X 3, normal strength and tone. Normal symmetric reflexes. Normal coordination and gait  ECOG PERFORMANCE STATUS: 1 - Symptomatic but completely ambulatory  Blood pressure (!) 103/57, pulse (!) 108, temperature 98 F (36.7 C), temperature source Oral, resp. rate 18, height '5\' 11"'$  (1.803 m), weight 137 lb 9.6 oz (62.4 kg), SpO2 100 %.  LABORATORY DATA: Lab Results  Component Value Date   WBC 3.6 (L) 02/28/2016   HGB 12.6 (L) 02/28/2016   HCT 38.7 02/28/2016   MCV 91.9 02/28/2016   PLT 183 02/28/2016      Chemistry      Component Value Date/Time   NA 140 02/28/2016 0804   K 4.5 02/28/2016 0804   CL 101 01/26/2015 1454   CO2 29 02/28/2016 0804   BUN 9.9 02/28/2016 0804   CREATININE 1.1 02/28/2016 0804      Component Value Date/Time   CALCIUM 9.4 02/28/2016 0804   ALKPHOS 54 02/28/2016 0804   AST 18 02/28/2016 0804   ALT 9 02/28/2016 0804   BILITOT <0.22 02/28/2016 0804        RADIOGRAPHIC STUDIES: Ct Chest W Contrast  Result Date: 02/28/2016 CLINICAL DATA:  Lung cancer diagnosed September 2016. EXAM: CT CHEST WITH CONTRAST TECHNIQUE: Multidetector CT imaging of the chest was performed during intravenous contrast administration. CONTRAST:  36m ISOVUE-300 IOPAMIDOL (ISOVUE-300) INJECTION 61% COMPARISON:  11/11/2015 FINDINGS: Cardiovascular: No significant vascular findings. Normal heart size. No pericardial effusion. Mediastinum/Nodes: No axillary supraclavicular adenopathy. Prevascular lymph node anterior to the aorta measures 8 mm unchanged. There is bronchial thickening extending to the RIGHT hilum unchanged. Lungs/Pleura: There is consolidation and fibrotic change surrounding RIGHT hilum similar to comparison exam. No new nodularity. LEFT lung is hyperexpanded. No discrete nodularity. Upper Abdomen: Adrenal glands are normal. No suspicious liver lesion. Musculoskeletal: No aggressive osseous lesion. IMPRESSION: 1. Stable post therapy change in RIGHT hemi thorax with perihilar volume loss and fibrotic consolidation. 2. No evidence of lung cancer progression. Electronically Signed   By: SSuzy BouchardM.D.   On: 02/28/2016 10:22    ASSESSMENT AND  PLAN: This is a very pleasant 70 years old African-American male with a stage IIIA non-small cell lung cancer and started on concurrent chemoradiation with weekly carboplatin and paclitaxel. Status post 6 week of treatment. He tolerated his treatment well. He has been off treatment for the last few weeks. This was followed by consolidation chemotherapy with reduced dose carboplatin and paclitaxel status post 3 cycles. The patient has been observation since completion of his consolidation chemotherapy. The recent CT scan of the chest showed no evidence for disease progression. I discussed the scan results with the patient and his sister. I recommended for him to continue on observation for now with repeat CT scan of the  chest in 3 months. The patient was advised to call immediately if he has any concerning symptoms in the interval. The patient voices understanding of current disease status and treatment options and is in agreement with the current care plan.  All questions were answered. The patient knows to call the clinic with any problems, questions or concerns. We can certainly see the patient much sooner if necessary.  Disclaimer: This note was dictated with voice recognition software. Similar sounding words can inadvertently be transcribed and may not be corrected upon review.

## 2016-03-06 NOTE — Telephone Encounter (Signed)
Appointments scheduled per 10/30 LOS. AVS report and calendar of next scheduled appointments.

## 2016-04-07 NOTE — Progress Notes (Signed)
[name] is here for a one month follow up appointment for non-small cell right lung cancer.  Weight changes, if any: Wt Readings from Last 3 Encounters:  04/10/16 139 lb 3.2 oz (63.1 kg)  03/06/16 137 lb 9.6 oz (62.4 kg)  11/30/15 131 lb 4.8 oz (59.6 kg)   Respiratory complaints, if any: Ivan Daniels a dry nonproductive cough Hemoptysis, if any:  None Swallowing Problems/Pain/Difficulty swallowing:Not taking Carafate not having any problems swallowing food or liquids.  Sister is putting medications in applesauce to help swallow his pills easier. Smoking Tobacco/Marijuana/Snuff/ETOH use: Former smoker cigarettes x 52 years quit 03-01-15, no alcohol or drug usage Skin:Normal kin color to chest. Pain : None Appetite: Good Fatigue:Having fatigue in the morning has more energy in the afternoon. When is next chemo scheduled?:None Lab work from of chart:No labs 1010-23-17 CT chest w contrast BP 132/87   Pulse (!) 105   Temp 98.3 F (36.8 C) (Oral)   Resp 18   Ht '5\' 11"'$  (1.803 m)   Wt 139 lb 3.2 oz (63.1 kg)   SpO2 94%   BMI 19.41 kg/m

## 2016-04-10 ENCOUNTER — Ambulatory Visit
Admission: RE | Admit: 2016-04-10 | Discharge: 2016-04-10 | Disposition: A | Discharge status: Home / Self Care | Payer: Medicare Other | Source: Ambulatory Visit | Attending: Radiation Oncology | Admitting: Radiation Oncology

## 2016-04-10 ENCOUNTER — Encounter: Payer: Self-pay | Admitting: Radiation Oncology

## 2016-04-10 VITALS — BP 132/87 | HR 105 | Temp 98.3°F | Resp 18 | Ht 71.0 in | Wt 139.2 lb

## 2016-04-10 DIAGNOSIS — F200 Paranoid schizophrenia: Secondary | ICD-10-CM | POA: Diagnosis not present

## 2016-04-10 DIAGNOSIS — C3411 Malignant neoplasm of upper lobe, right bronchus or lung: Secondary | ICD-10-CM | POA: Insufficient documentation

## 2016-04-10 DIAGNOSIS — Z79899 Other long term (current) drug therapy: Secondary | ICD-10-CM | POA: Diagnosis not present

## 2016-04-10 DIAGNOSIS — Z9889 Other specified postprocedural states: Secondary | ICD-10-CM | POA: Insufficient documentation

## 2016-04-10 DIAGNOSIS — N400 Enlarged prostate without lower urinary tract symptoms: Secondary | ICD-10-CM | POA: Diagnosis not present

## 2016-04-10 DIAGNOSIS — Z87891 Personal history of nicotine dependence: Secondary | ICD-10-CM | POA: Insufficient documentation

## 2016-04-10 DIAGNOSIS — Z9221 Personal history of antineoplastic chemotherapy: Secondary | ICD-10-CM | POA: Diagnosis not present

## 2016-04-10 DIAGNOSIS — Z888 Allergy status to other drugs, medicaments and biological substances status: Secondary | ICD-10-CM | POA: Insufficient documentation

## 2016-04-10 DIAGNOSIS — E785 Hyperlipidemia, unspecified: Secondary | ICD-10-CM | POA: Diagnosis not present

## 2016-04-10 DIAGNOSIS — R05 Cough: Secondary | ICD-10-CM | POA: Insufficient documentation

## 2016-04-10 DIAGNOSIS — Z803 Family history of malignant neoplasm of breast: Secondary | ICD-10-CM | POA: Diagnosis not present

## 2016-04-10 NOTE — Progress Notes (Signed)
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  Interval Since Last Radiation:  52 weeks   03/01/2015 through 04/15/2015:  The patient was treated to the gross disease within the right lung initially to a dose of 60 gray in 30 fractions using a 5 field 3-D conformal technique. The patient then received a 6 gray boost using a cone down 5 field technique. The patient's total dose was 66 gray. This was given with concurrent chemotherapy.  Narrative:  The patient returns today for routine follow-up.  In summary, the patient was treated with concurrent chemotherapy and 30 fractions of radiotherapy to the right upper lobe due to a stage III A squamous cell carcinoma of the lung. He has been followed closely in surveillance, and underwent a CT chest on 02/28/16 revealing no evidence of progression and resolution of his previously treated disease. He is planning forward in active surveillance with serial scans under the care of Dr. Julien Nordmann. He comes today for follow-up.                          On review of systems, the patient states he is doing pretty well overall. He has had a dry cough that has been bothersome for him for quite some time, and reports occasional wheezing. He denies any productive sputum or hemoptysis. He denies fevers or chills, chest pain or shortness of breath. He states that he has not had any unintended weight changes, nausea, vomiting, abdominal pain, difficulty with bowel or bladder activity. He denies any headaches, blurred vision or double vision. He denies any new musculoskeletal aches or pains. A complete review of systems is obtained and is otherwise  negative.  Past Medical History:  Past Medical History:  Diagnosis Date  . ALCOHOL ABUSE 11/12/2009   Qualifier: Diagnosis of  By: Hassell Done FNP, Tori Milks    . Allergy   . BENIGN PROSTATIC HYPERTROPHY, HX OF 12/19/2006   Qualifier: Diagnosis of  By: Radene Ou MD, [place]    . Full code status 02/10/2015  . Hyperlipidemia   . Lung mass 01/25/2015   3.7 x 3.6 cm RUL spiculated lung mass with 2.0 cm right paratracheal lymph node suspicious for bronchogenic CA  . Non-small cell lung cancer ([place]) 01/27/15   non small-cell,squamous cell ca RUL  . Paranoid schizophrenia ([place]) 10/31/2009   Qualifier: Diagnosis of  By: Jorene Minors, Scott    . Primary spontaneous pneumothorax, left 01/25/2015  . TOBACCO ABUSE 05/24/2009   Qualifier: Diagnosis of  By: Hassell Done FNP, Tori Milks      Past Surgical History: Past Surgical History:  Procedure Laterality Date  . LUNG BIOPSY Right 01/27/2015   Procedure: Right Upper Lobe Bronchus BIOPSY;  Surgeon: Grace Isaac, MD;  Location: [facility];  Service: Thoracic;  Laterality: Right;  Marland Kitchen VIDEO BRONCHOSCOPY WITH ENDOBRONCHIAL ULTRASOUND N/A 01/27/2015   Procedure: VIDEO BRONCHOSCOPY WITH ENDOBRONCHIAL ULTRASOUND;  Surgeon: Grace Isaac, MD;  Location: [facility];  Service: Thoracic;  Laterality: N/A;    Social History:  Social History   Social History  . Marital status: Single    Spouse name: N/A  . Number of children: N/A  .  Years of education: N/A   Occupational History  . Not on file.   Social History Main Topics  . Smoking status: Former Smoker    Packs/day: 0.50    Years: 52.00    Types: Cigarettes  . Smokeless tobacco: Former Systems developer    Quit date: 03/01/2015  . Alcohol use No  . Drug use: No  . Sexual activity: No   Other Topics Concern  . Not on file   Social History Narrative  . No narrative on file  The patient is married and accompanied by his wife.  Family History: Family History  Problem Relation Age of Onset  . Cancer Sister     breast      ALLERGIES:  is allergic to benztropine mesylate and chlorpromazine hcl.  Meds: Current Outpatient Prescriptions  Medication Sig Dispense Refill  . benztropine (COGENTIN) 0.5 MG tablet Take 0.5 mg by mouth at bedtime.     . chlorhexidine (PERIDEX) 0.12 % solution 1 mL by Mouth Rinse route See admin instructions.  1  . clopidogrel (PLAVIX) 75 MG tablet Take 75 mg by mouth daily.     . ferrous fumarate (HEMOCYTE - 106 MG FE) 325 (106 FE) MG TABS tablet Take 1 tablet by mouth daily.     . haloperidol (HALDOL) 5 MG tablet Take 5 mg by mouth at bedtime.     . Multiple Vitamins-Minerals (MULTIVITAMIN WITH MINERALS) tablet Take 1 tablet by mouth daily.    . simvastatin (ZOCOR) 20 MG tablet Take 20 mg by mouth at bedtime.     . lidocaine-prilocaine (EMLA) cream Apply 1 application topically as needed. (Patient not taking: Reported on 04/10/2016) 30 g 0  . oxyCODONE (OXY IR/ROXICODONE) 5 MG immediate release tablet Take 1 tablet (5 mg total) by mouth every 3 (three) hours as needed for moderate pain. (Patient not taking: Reported on 04/10/2016) 30 tablet 0  . oxyCODONE 10 MG TABS Take 1 tablet (10 mg total) by mouth every 3 (three) hours as needed for severe pain. (Patient not taking: Reported on 04/10/2016) 30 tablet 0  . prochlorperazine (COMPAZINE) 10 MG tablet Take 1 tablet (10 mg total) by mouth every 6 (six) hours as needed for nausea or vomiting. (Patient not taking: Reported on 04/10/2016) 30 tablet 0  . sucralfate (CARAFATE) 1 G tablet Take 1 tablet (1 g total) by mouth 4 (four) times daily. (Patient not taking: Reported on 04/10/2016) 120 tablet 2   No current facility-administered medications for this encounter.     Physical Findings:  height is '5\' 11"'$  (1.803 m) and weight is 139 lb 3.2 oz (63.1 kg). His oral temperature is 98.3 F (36.8 C). His blood pressure is 132/87 and his pulse is 105 (abnormal). His respiration is 18 and oxygen saturation is 94%.  In general this is a well  appearing African American male in no acute distress. He's alert and oriented x4 and appropriate throughout the examination. Cardiovascular exam reveals a tachycardic rate, and normal rhythm. No clicks rubs or murmurs are auscultated. Chest is clear to auscultation in the left upper apex, and wheezes are noted throughout the right lung, and at the left mid to bace of the lung. No consolidation is identified. HEENT reveals that the patient is normocephalic, atraumatic. EOMs are intact. PERRLA. Skin is intact without any evidence of gross lesions. Lymphatic assessment is performed and does not reveal any adenopathy in the cervical, supraclavicular, axillary, or inguinal chains. Abdomen has active bowel sounds in all quadrants and is  intact. The abdomen is soft, non tender, non distended. Lower extremities are negative for pretibial pitting edema, deep calf tenderness, cyanosis or clubbing.   Lab Findings: Lab Results  Component Value Date   WBC 3.6 (L) 02/28/2016   HGB 12.6 (L) 02/28/2016   HCT 38.7 02/28/2016   MCV 91.9 02/28/2016   PLT 183 02/28/2016     Radiographic Findings: No results found.  Impression/Plan: 1. Stage IIIA (T2a, N2, M0) non-small cell lung cancer consistent with invasive squamous cell carcinoma presented with right upper lobe. The patient appears to be doing well and radiographically appears to be without evidence of disease. He will continue to follow up with Dr. Julien Nordmann every 3 months with surveillance scans, and we will be happy to see him back as needed moving forward. He states agreement and understanding and is encouraged to questions or concerns regarding his previous treatment. 2. Chronic cough, lung volume loss on imaging, questionable COPD. The patient does not have a pulmonary physician following him, and I have encouraged him to be evaluated in this setting given his symptoms, and he is interested. We'll set him up for referral and appreciate pulmonary disease  evaluation and management of this patient.     Carola Rhine, PAC

## 2016-04-11 ENCOUNTER — Telehealth: Payer: Self-pay | Admitting: *Deleted

## 2016-04-11 NOTE — Telephone Encounter (Signed)
CALLED PATIENT TO INFORM OF APPT. WITH DR. SOOD OF [facility] ON 04-13-16 - ARRIVAL TIME - 11:30 AM, LVM FOR A RETURN CALL

## 2016-04-12 ENCOUNTER — Telehealth: Payer: Self-pay | Admitting: *Deleted

## 2016-04-12 NOTE — Telephone Encounter (Signed)
Called patient to inform that I cancelled appt.on 04-13-16 per patient request, the first appt. That I could get them at this point is Jan. 5 - arrival time - 2 pm with Dr. Ashok Cordia, patient agreed to new time and date, per patient's sister- Jetty Duhamel

## 2016-04-13 ENCOUNTER — Institutional Professional Consult (permissible substitution): Payer: Medicare Other | Admitting: Pulmonary Disease

## 2016-05-12 ENCOUNTER — Encounter: Payer: Self-pay | Admitting: Pulmonary Disease

## 2016-05-12 ENCOUNTER — Telehealth: Payer: Self-pay | Admitting: Pulmonary Disease

## 2016-05-12 ENCOUNTER — Ambulatory Visit (INDEPENDENT_AMBULATORY_CARE_PROVIDER_SITE_OTHER): Payer: Medicare Other | Admitting: Pulmonary Disease

## 2016-05-12 ENCOUNTER — Other Ambulatory Visit: Payer: Medicare Other

## 2016-05-12 VITALS — BP 112/80 | HR 94 | Ht 71.0 in | Wt 142.6 lb

## 2016-05-12 DIAGNOSIS — R1310 Dysphagia, unspecified: Secondary | ICD-10-CM | POA: Insufficient documentation

## 2016-05-12 DIAGNOSIS — R1319 Other dysphagia: Secondary | ICD-10-CM

## 2016-05-12 DIAGNOSIS — J439 Emphysema, unspecified: Secondary | ICD-10-CM

## 2016-05-12 DIAGNOSIS — C3491 Malignant neoplasm of unspecified part of right bronchus or lung: Secondary | ICD-10-CM

## 2016-05-12 DIAGNOSIS — Z23 Encounter for immunization: Secondary | ICD-10-CM | POA: Diagnosis not present

## 2016-05-12 NOTE — Progress Notes (Signed)
Subjective:    Patient ID: Ivan Daniels, male    DOB: 11/25/1945, 71 y.o.   MRN: [id number]  HPI Patient known to have squamous cell carcinoma of the right lung diagnosed in 2016. Status post concurrent chemotherapy and radiation. He finished his treatment in October of 2016. Previously did have a left pneumothorax about a year ago. He reports he has noticed increased dyspnea that is intermittent over the last month. He does cough frequently, especially at night. He reports his cough is nonproductive. He has had audible wheezing. He denies any illnesses around the time of his symptom onset. No history of bronchitis. Not currently and not previously on any inhaler medications. No history of breathing problems or asthma as a child. Denies any chest tightness or pain. He did have a syncopal episode a month ago which was attributed to dehydration. No fever, chills, or sweats. He does have swallowing pills at times. No odynophagia. He reports dysphagia, especially with meat in his upper chest/lower throat. No morning brash water taste. Denies any sinus congestion or drainage.   Review of Systems No rashes or bruising. No abdominal pain, nausea, emesis, or diarrhea. No headaches. No focal weakness, numbness or tingling. A pertinent 14 point review of systems is negative except as per the history of presenting illness.  Allergies  Allergen Reactions  . Benztropine Mesylate     REACTION: decrease ROM neck. Pt states he is not allergic   . Chlorpromazine Hcl     unknown    Current Outpatient Prescriptions on File Prior to Visit  Medication Sig Dispense Refill  . chlorhexidine (PERIDEX) 0.12 % solution 1 mL by Mouth Rinse route See admin instructions.  1  . clopidogrel (PLAVIX) 75 MG tablet Take 75 mg by mouth daily.     . ferrous fumarate (HEMOCYTE - 106 MG FE) 325 (106 FE) MG TABS tablet Take 1 tablet by mouth daily.     . haloperidol (HALDOL) 5 MG tablet Take 5 mg by mouth at bedtime.     .  Multiple Vitamins-Minerals (MULTIVITAMIN WITH MINERALS) tablet Take 1 tablet by mouth daily.    . simvastatin (ZOCOR) 20 MG tablet Take 20 mg by mouth at bedtime.     . benztropine (COGENTIN) 0.5 MG tablet Take 0.5 mg by mouth at bedtime.     . lidocaine-prilocaine (EMLA) cream Apply 1 application topically as needed. (Patient not taking: Reported on 05/12/2016) 30 g 0  . oxyCODONE (OXY IR/ROXICODONE) 5 MG immediate release tablet Take 1 tablet (5 mg total) by mouth every 3 (three) hours as needed for moderate pain. (Patient not taking: Reported on 05/12/2016) 30 tablet 0  . oxyCODONE 10 MG TABS Take 1 tablet (10 mg total) by mouth every 3 (three) hours as needed for severe pain. (Patient not taking: Reported on 05/12/2016) 30 tablet 0  . prochlorperazine (COMPAZINE) 10 MG tablet Take 1 tablet (10 mg total) by mouth every 6 (six) hours as needed for nausea or vomiting. (Patient not taking: Reported on 05/12/2016) 30 tablet 0  . sucralfate (CARAFATE) 1 G tablet Take 1 tablet (1 g total) by mouth 4 (four) times daily. (Patient not taking: Reported on 05/12/2016) 120 tablet 2   No current facility-administered medications on file prior to visit.     Past Medical History:  Diagnosis Date  . ALCOHOL ABUSE 11/12/2009   Qualifier: Diagnosis of  By: Hassell Done FNP, Tori Milks    . Allergy   . BENIGN PROSTATIC HYPERTROPHY, HX OF 12/19/2006  Qualifier: Diagnosis of  By: Radene Ou MD, [place]    . Emphysema of lung ([place])   . Full code status 02/10/2015  . Hyperlipidemia   . Lung mass 01/25/2015   3.7 x 3.6 cm RUL spiculated lung mass with 2.0 cm right paratracheal lymph node suspicious for bronchogenic CA  . Non-small cell lung cancer ([place]) 01/27/15   non small-cell,squamous cell ca RUL  . Paranoid schizophrenia ([place]) 10/31/2009   Qualifier: Diagnosis of  By: Jorene Minors, Scott    . Primary spontaneous pneumothorax, left 01/25/2015  . TOBACCO ABUSE 05/24/2009   Qualifier: Diagnosis of  By: Hassell Done FNP, Tori Milks       Past Surgical History:  Procedure Laterality Date  . APPENDECTOMY    . CHEST TUBE INSERTION Left   . INSERTION CENTRAL VENOUS ACCESS DEVICE W/ SUBCUTANEOUS PORT Right   . LUNG BIOPSY Right 01/27/2015   Procedure: Right Upper Lobe Bronchus BIOPSY;  Surgeon: Grace Isaac, MD;  Location: [facility];  Service: Thoracic;  Laterality: Right;  Marland Kitchen VIDEO BRONCHOSCOPY WITH ENDOBRONCHIAL ULTRASOUND N/A 01/27/2015   Procedure: VIDEO BRONCHOSCOPY WITH ENDOBRONCHIAL ULTRASOUND;  Surgeon: Grace Isaac, MD;  Location: [facility];  Service: Thoracic;  Laterality: N/A;    Family History  Problem Relation Age of Onset  . Breast cancer Sister   . Diabetes Sister   . Hyperlipidemia Sister   . Lung disease Neg Hx     Social History   Social History  . Marital status: Single    Spouse name: N/A  . Number of children: N/A  . Years of education: N/A   Social History Main Topics  . Smoking status: Former Smoker    Packs/day: 0.25    Years: 52.00    Types: Cigarettes    Quit date: 01/07/2015  . Smokeless tobacco: Former Systems developer    Quit date: 03/01/2015     Comment: Peak rate of 4-5 cigarettes per day  . Alcohol use No  . Drug use: No  . Sexual activity: No   Other Topics Concern  . None   Social History Narrative   [facility] (05/12/16):   Originally from [facility]. Has always lived in [place]. 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 No known asbestos exposure. Significant dust and cotton dust exposure. No pets currently. No bird or mold exposure.       Objective:   Physical Exam BP 112/80 (BP Location: Right Arm, Patient Position: Sitting, Cuff Size: Normal)   Pulse 94   Ht '5\' 11"'$  (6.659 m)   Wt 142 lb 9.6 oz (64.7 kg)   SpO2 100%   BMI 19.89 kg/m  General:  Awake. Alert. No acute distress. Thin African-American male.  Integument:  Warm & dry. No rash on exposed skin.  Extremities:  No cyanosis or clubbing.  Lymphatics:  No appreciated cervical or supraclavicular  lymphadenoapthy. HEENT:  Moist mucus membranes. No oral ulcers. No scleral injection or icterus.  Cardiovascular:  Regular rate and rhythm. No edema. No appreciable JVD.  Pulmonary:  Good aeration & clear to auscultation bilaterally. Symmetric chest wall expansion. No accessory muscle use. Abdomen: Soft. Normal bowel sounds. Nondistended. Grossly nontender. Musculoskeletal:  Normal bulk and tone. Hand grip strength 5/5 bilaterally. No joint deformity or effusion appreciated. Neurological:  CN 2-12 grossly in tact. No meningismus. Moving all 4 extremities equally. Symmetric brachioradialis deep tendon reflexes. Psychiatric:  Mood and affect congruent. Speech normal rhythm, rate & tone.   IMAGING CT CHEST W/ CONTRAST 02/28/16 (personally  reviewed by me): Atypical predominant emphysema & subpleural bleb formation. Right hilar consolidation suggesting fibrotic change with continued stability. Hyperexpansion of left lung volume loss on the right. No new nodule or opacity appreciated. No pathologic mediastinal adenopathy. No pleural effusion or thickening. No pericardial effusion.  PATHOLOGY Right Upper Lobe Bronchial Brush (01/27/15):  Squamous Cell Carcinoma Right Upper Lobe Endobronchial Biopsy (01/27/15):  Squamous Cell Carcinoma EBUS Level 7 FNA (01/27/15):  Squamous Cell Carcinoma     Assessment & Plan:  71 y.o. male with known history of non-small cell lung cancer stage IIIa of his right lung status post concurrent chemotherapy and radiation therapy. Reviewing his chest CT imaging from October does show apical emphysema and subpleural emphysema that necessitate screening for alpha-1 antitrypsin deficiency. Before starting inhaler therapy we will need to assess his pulmonary function more closely. With his underlying dysphagia I do question the possibility of reflux contributing to his nocturnal wheezing/cough. I instructed the patient to contact my office if he had any breathing problems or  questions before his next appointment.  1. Emphysema:  Screening for alpha-1 antitrypsin deficiency. Checking full pulmonary function testing and 6 minute walk test on room air before next appointment. Holding off on initiating inhaler medications at this time. 2. NSCLC Stage IIIA (Squamous Cell):  Found in 2016 & treated with concurrent chemotherapy & radiation. Follows with medical oncology. 3. Dysphagia: Checking esophagogram/barium swallow. 4. Health Maintenance:  S/P Influenza Vaccine October 2017 & Pneumovax 29 December 2002. Administering Prevnar 13 vaccine today. 5. Follow-up:  Return to clinic in 4 weeks or sooner if needed.  Sonia Baller Ashok Cordia, M.D. [facility] Pager:  [phone] After 3pm or if no response, call [phone] 3:18 PM 05/12/16

## 2016-05-12 NOTE — Patient Instructions (Addendum)
   Call me if you have any new breathing problems or questions before your next appointment.  I will see you back in about 4 weeks to review your test results.   TESTS ORDERED: 1. Serum Alpha-1 Antitrypsin Phenotype 2. Esophagram/Barium Swallow 3. Full PFTs before next appointment 4. 6MWT on room air before next appointment

## 2016-05-12 NOTE — Addendum Note (Signed)
Addended by: Parke Poisson E on: 05/12/2016 03:43 PM   Modules accepted: Orders

## 2016-05-12 NOTE — Telephone Encounter (Signed)
IMAGING CT CHEST W/ CONTRAST 02/28/16 (personally reviewed by me): Atypical predominant emphysema & subpleural bleb formation. Right hilar consolidation suggesting fibrotic change with continued stability. Hyperexpansion of left lung volume loss on the right. No new nodule or opacity appreciated. No pathologic mediastinal adenopathy. No pleural effusion or thickening. No pericardial effusion.  PATHOLOGY Right Upper Lobe Bronchial Brush (01/27/15):  Squamous Cell Carcinoma Right Upper Lobe Endobronchial Biopsy (01/27/15):  Squamous Cell Carcinoma EBUS Level 7 FNA (01/27/15):  Squamous Cell Carcinoma

## 2016-05-15 DIAGNOSIS — R05 Cough: Secondary | ICD-10-CM | POA: Diagnosis not present

## 2016-05-15 DIAGNOSIS — Z681 Body mass index (BMI) 19 or less, adult: Secondary | ICD-10-CM | POA: Diagnosis not present

## 2016-05-15 DIAGNOSIS — E860 Dehydration: Secondary | ICD-10-CM | POA: Diagnosis not present

## 2016-05-15 DIAGNOSIS — R636 Underweight: Secondary | ICD-10-CM | POA: Diagnosis not present

## 2016-05-15 DIAGNOSIS — Z79899 Other long term (current) drug therapy: Secondary | ICD-10-CM | POA: Diagnosis not present

## 2016-05-17 ENCOUNTER — Ambulatory Visit (INDEPENDENT_AMBULATORY_CARE_PROVIDER_SITE_OTHER): Payer: Medicare Other | Admitting: Podiatry

## 2016-05-17 ENCOUNTER — Encounter: Payer: Self-pay | Admitting: Podiatry

## 2016-05-17 VITALS — Ht 71.0 in | Wt 142.0 lb

## 2016-05-17 DIAGNOSIS — Q828 Other specified congenital malformations of skin: Secondary | ICD-10-CM | POA: Diagnosis not present

## 2016-05-17 DIAGNOSIS — S98139S Complete traumatic amputation of one unspecified lesser toe, sequela: Secondary | ICD-10-CM

## 2016-05-17 LAB — ALPHA-1 ANTITRYPSIN PHENOTYPE: A-1 Antitrypsin: 151 mg/dL (ref 83–199)

## 2016-05-17 NOTE — Progress Notes (Signed)
Subjective:     Patient ID: Ivan Daniels, male   DOB: 05-04-1946, 71 y.o.   MRN: [id number]  HPIThis patient presents to my office with painful areas on the bottom of both feet.  He says he lost his toes to frostbite and now develops callused areas on the bottom of both feet.   Review of Systems     Objective:   Physical Exam Objective: Review of past medical history, medications, social history and allergies were performed.  Vascular: Dorsalis pedis and posterior tibial pulses were palpable B/L, capillary refill was  WNL B/L, temperature gradient was WNL B/L   Skin:  No signs of symptoms of infection or ulcers on both feet.  Porokeratosis sub2 right foot and sub ,4 th left foot.   Sensory: Thornell Mule monifilament WNL   Orthopedic: Orthopedic evaluation demonstrates all joints distal t ankle have full ROM without crepitus, muscle power WNL B/L    Assessment:     Porokeratosis B/L     Plan:    Debridement of porokeratosis RTC 10 weeks   Gardiner Barefoot DPM

## 2016-05-19 ENCOUNTER — Ambulatory Visit (HOSPITAL_COMMUNITY)
Admission: RE | Admit: 2016-05-19 | Discharge: 2016-05-19 | Disposition: A | Discharge status: Home / Self Care | Payer: Medicare Other | Source: Ambulatory Visit | Attending: Pulmonary Disease | Admitting: Pulmonary Disease

## 2016-05-19 DIAGNOSIS — R1319 Other dysphagia: Secondary | ICD-10-CM

## 2016-05-19 DIAGNOSIS — R1310 Dysphagia, unspecified: Secondary | ICD-10-CM | POA: Diagnosis not present

## 2016-05-19 DIAGNOSIS — K224 Dyskinesia of esophagus: Secondary | ICD-10-CM | POA: Diagnosis not present

## 2016-05-26 ENCOUNTER — Other Ambulatory Visit: Payer: Self-pay | Admitting: Pulmonary Disease

## 2016-05-26 DIAGNOSIS — R1319 Other dysphagia: Secondary | ICD-10-CM

## 2016-05-26 DIAGNOSIS — R1310 Dysphagia, unspecified: Secondary | ICD-10-CM

## 2016-05-26 NOTE — Progress Notes (Signed)
Left message for patient to call office for results. °

## 2016-05-26 NOTE — Progress Notes (Signed)
Spoke with pt sister, Dorthory, who stated she understood exam results and referral to Gi. She did not have any further questions or concerns.

## 2016-05-29 ENCOUNTER — Encounter: Payer: Self-pay | Admitting: Gastroenterology

## 2016-06-01 ENCOUNTER — Other Ambulatory Visit (HOSPITAL_BASED_OUTPATIENT_CLINIC_OR_DEPARTMENT_OTHER): Payer: Medicare Other

## 2016-06-01 DIAGNOSIS — C3411 Malignant neoplasm of upper lobe, right bronchus or lung: Secondary | ICD-10-CM

## 2016-06-01 DIAGNOSIS — C3491 Malignant neoplasm of unspecified part of right bronchus or lung: Secondary | ICD-10-CM

## 2016-06-01 LAB — CBC WITH DIFFERENTIAL/PLATELET
BASO%: 0.2 % (ref 0.0–2.0)
Basophils Absolute: 0 10*3/uL (ref 0.0–0.1)
EOS%: 1.5 % (ref 0.0–7.0)
Eosinophils Absolute: 0.1 10*3/uL (ref 0.0–0.5)
HCT: 40.8 % (ref 38.4–49.9)
HGB: 13.6 g/dL (ref 13.0–17.1)
LYMPH%: 27.7 % (ref 14.0–49.0)
MCH: 30.1 pg (ref 27.2–33.4)
MCHC: 33.3 g/dL (ref 32.0–36.0)
MCV: 90.3 fL (ref 79.3–98.0)
MONO#: 0.5 10*3/uL (ref 0.1–0.9)
MONO%: 12.7 % (ref 0.0–14.0)
NEUT#: 2.3 10*3/uL (ref 1.5–6.5)
NEUT%: 57.9 % (ref 39.0–75.0)
Platelets: 195 10*3/uL (ref 140–400)
RBC: 4.52 10*6/uL (ref 4.20–5.82)
RDW: 15.4 % — ABNORMAL HIGH (ref 11.0–14.6)
WBC: 4 10*3/uL (ref 4.0–10.3)
lymph#: 1.1 10*3/uL (ref 0.9–3.3)
nRBC: 0 % (ref 0–0)

## 2016-06-01 LAB — COMPREHENSIVE METABOLIC PANEL
ALT: 14 U/L (ref 0–55)
AST: 22 U/L (ref 5–34)
Albumin: 4.1 g/dL (ref 3.5–5.0)
Alkaline Phosphatase: 58 U/L (ref 40–150)
Anion Gap: 10 mEq/L (ref 3–11)
BUN: 10.9 mg/dL (ref 7.0–26.0)
CO2: 26 mEq/L (ref 22–29)
Calcium: 10 mg/dL (ref 8.4–10.4)
Chloride: 104 mEq/L (ref 98–109)
Creatinine: 1 mg/dL (ref 0.7–1.3)
EGFR: >90 mL/min/{1.73_m2} (ref >90)
Glucose: 80 mg/dl (ref 70–140)
Potassium: 4.1 mEq/L (ref 3.5–5.1)
Sodium: 139 mEq/L (ref 136–145)
Total Bilirubin: 0.23 mg/dL (ref 0.20–1.20)
Total Protein: 8.1 g/dL (ref 6.4–8.3)

## 2016-06-02 ENCOUNTER — Other Ambulatory Visit: Payer: Medicare Other

## 2016-06-05 ENCOUNTER — Ambulatory Visit (HOSPITAL_COMMUNITY)
Admission: RE | Admit: 2016-06-05 | Discharge: 2016-06-05 | Disposition: A | Discharge status: Home / Self Care | Payer: Medicare Other | Source: Ambulatory Visit | Attending: Internal Medicine | Admitting: Internal Medicine

## 2016-06-05 ENCOUNTER — Encounter (HOSPITAL_COMMUNITY): Payer: Self-pay

## 2016-06-05 DIAGNOSIS — J439 Emphysema, unspecified: Secondary | ICD-10-CM | POA: Diagnosis not present

## 2016-06-05 DIAGNOSIS — C3491 Malignant neoplasm of unspecified part of right bronchus or lung: Secondary | ICD-10-CM | POA: Diagnosis not present

## 2016-06-05 DIAGNOSIS — Z9889 Other specified postprocedural states: Secondary | ICD-10-CM | POA: Insufficient documentation

## 2016-06-05 DIAGNOSIS — Z923 Personal history of irradiation: Secondary | ICD-10-CM | POA: Diagnosis not present

## 2016-06-05 MED ORDER — IOPAMIDOL (ISOVUE-300) INJECTION 61%
75.0000 mL | Freq: Once | INTRAVENOUS | Status: AC | PRN
  Administered 2016-06-05: 75 mL via INTRAVENOUS

## 2016-06-05 MED ORDER — IOPAMIDOL (ISOVUE-300) INJECTION 61%
INTRAVENOUS | Status: AC
  Filled 2016-06-05: qty 75

## 2016-06-06 ENCOUNTER — Telehealth: Payer: Self-pay | Admitting: Internal Medicine

## 2016-06-06 ENCOUNTER — Encounter: Payer: Self-pay | Admitting: Internal Medicine

## 2016-06-06 ENCOUNTER — Ambulatory Visit (HOSPITAL_BASED_OUTPATIENT_CLINIC_OR_DEPARTMENT_OTHER): Payer: Medicare Other | Admitting: Internal Medicine

## 2016-06-06 VITALS — BP 107/62 | HR 103 | Temp 98.0°F | Resp 18 | Wt 143.2 lb

## 2016-06-06 DIAGNOSIS — R1310 Dysphagia, unspecified: Secondary | ICD-10-CM

## 2016-06-06 DIAGNOSIS — Z85038 Personal history of other malignant neoplasm of large intestine: Secondary | ICD-10-CM | POA: Diagnosis not present

## 2016-06-06 DIAGNOSIS — C3411 Malignant neoplasm of upper lobe, right bronchus or lung: Secondary | ICD-10-CM

## 2016-06-06 NOTE — Telephone Encounter (Signed)
Appointments scheduled per 1/30 LOS. Patient given AVS report and calendars with future scheduled appointments.

## 2016-06-06 NOTE — Progress Notes (Signed)
[facility] [place] [phone]   [phone]  OFFICE PROGRESS NOTE  [name], FNP [address] [place] [address]  DIAGNOSIS: Stage IIIA (T2a, N2, M0) non-small cell lung cancer consistent with invasive squamous cell carcinoma presented with right upper lobe lung mass in addition to right hilar and mediastinal lymphadenopathy diagnosed in September 2016.  PRIOR THERAPY:  1) Course of concurrent chemoradiation with weekly carboplatin for AUC of 2 and paclitaxel 45 MG/M2. First dose 03/01/2015. He is status post 6 cycles. Last cycle was given 04/12/2015 with partial response. 2) Consolidation chemotherapy with reduced dose carboplatin for AUC of 5 and paclitaxel 175 MG/M2 every 3 weeks, status post 3 cycles.   CURRENT THERAPY: Observation.  INTERVAL HISTORY: Ivan Daniels 71 y.o. male (the clinic today for follow-up visit accompanied by his sister. The patient is feeling fine today was no specific complaints. He has some difficulty swallowing in the upper esophagus. He denied having any weight loss or night sweats. He has no nausea, vomiting, diarrhea or constipation. He has no fever or chills. The patient denied having any significant chest pain, shortness breath, cough or hemoptysis. He has repeat CT scan of the chest performed recently and he is here for evaluation and discussion of his scan results.   MEDICAL HISTORY: Past Medical History:  Diagnosis Date  . ALCOHOL ABUSE 11/12/2009   Qualifier: Diagnosis of  By: Hassell Done FNP, Tori Milks    . Allergy   . BENIGN PROSTATIC HYPERTROPHY, HX OF 12/19/2006   Qualifier: Diagnosis of  By: Radene Ou MD, [place]    . Emphysema of lung ([place])   . Full code status 02/10/2015  . Hyperlipidemia   . Lung mass 01/25/2015   3.7 x 3.6 cm RUL spiculated lung mass with 2.0 cm right paratracheal lymph node suspicious for bronchogenic CA  . Non-small cell lung cancer ([facility] [facility]) 01/27/15   non small-cell,squamous cell  ca RUL  . Paranoid schizophrenia ([place]) 10/31/2009   Qualifier: Diagnosis of  By: Jorene Minors, [place]    . Primary spontaneous pneumothorax, left 01/25/2015  . TOBACCO ABUSE 05/24/2009   Qualifier: Diagnosis of  By: Hassell Done FNP, [place]      ALLERGIES:  is allergic to benztropine mesylate and chlorpromazine hcl.  MEDICATIONS:  Current Outpatient Prescriptions  Medication Sig Dispense Refill  . benztropine (COGENTIN) 0.5 MG tablet Take 0.5 mg by mouth at bedtime.     . chlorhexidine (PERIDEX) 0.12 % solution 1 mL by Mouth Rinse route See admin instructions.  1  . clopidogrel (PLAVIX) 75 MG tablet Take 75 mg by mouth daily.     . ferrous fumarate (HEMOCYTE - 106 MG FE) 325 (106 FE) MG TABS tablet Take 1 tablet by mouth daily.     . haloperidol (HALDOL) 5 MG tablet Take 5 mg by mouth at bedtime.     . lidocaine-prilocaine (EMLA) cream Apply 1 application topically as needed. 30 g 0  . Multiple Vitamins-Minerals (MULTIVITAMIN WITH MINERALS) tablet Take 1 tablet by mouth daily.    . simvastatin (ZOCOR) 20 MG tablet Take 20 mg by mouth at bedtime.     . prochlorperazine (COMPAZINE) 10 MG tablet Take 1 tablet (10 mg total) by mouth every 6 (six) hours as needed for nausea or vomiting. (Patient not taking: Reported on 06/06/2016) 30 tablet 0   No current facility-administered medications for this visit.     SURGICAL HISTORY:  Past Surgical History:  Procedure Laterality Date  .  APPENDECTOMY    . CHEST TUBE INSERTION Left   . INSERTION CENTRAL VENOUS ACCESS DEVICE W/ SUBCUTANEOUS PORT Right   . LUNG BIOPSY Right 01/27/2015   Procedure: Right Upper Lobe Bronchus BIOPSY;  Surgeon: Grace Isaac, MD;  Location: [facility];  Service: Thoracic;  Laterality: Right;  Marland Kitchen VIDEO BRONCHOSCOPY WITH ENDOBRONCHIAL ULTRASOUND N/A 01/27/2015   Procedure: VIDEO BRONCHOSCOPY WITH ENDOBRONCHIAL ULTRASOUND;  Surgeon: Grace Isaac, MD;  Location: [facility];  Service: Thoracic;  Laterality: N/A;    REVIEW OF SYSTEMS:   A comprehensive review of systems was negative except for: Gastrointestinal: positive for dysphagia   PHYSICAL EXAMINATION: General appearance: alert, cooperative and no distress Head: Normocephalic, without obvious abnormality, atraumatic Neck: no adenopathy, no JVD, supple, symmetrical, trachea midline and thyroid not enlarged, symmetric, no tenderness/mass/nodules Lymph nodes: Cervical, supraclavicular, and axillary nodes normal. Resp: clear to auscultation bilaterally Back: symmetric, no curvature. ROM normal. No CVA tenderness. Cardio: regular rate and rhythm, S1, S2 normal, no murmur, click, rub or gallop GI: soft, non-tender; bowel sounds normal; no masses,  no organomegaly Extremities: extremities normal, atraumatic, no cyanosis or edema  ECOG PERFORMANCE STATUS: 1 - Symptomatic but completely ambulatory  Blood pressure 107/62, pulse (!) 103, temperature 98 F (36.7 C), temperature source Oral, resp. rate 18, weight 143 lb 3.2 oz (65 kg), SpO2 100 %.  LABORATORY DATA: Lab Results  Component Value Date   WBC 4.0 06/01/2016   HGB 13.6 06/01/2016   HCT 40.8 06/01/2016   MCV 90.3 06/01/2016   PLT 195 06/01/2016      Chemistry      Component Value Date/Time   NA 139 06/01/2016 1034   K 4.1 06/01/2016 1034   CL 101 01/26/2015 1454   CO2 26 06/01/2016 1034   BUN 10.9 06/01/2016 1034   CREATININE 1.0 06/01/2016 1034      Component Value Date/Time   CALCIUM 10.0 06/01/2016 1034   ALKPHOS 58 06/01/2016 1034   AST 22 06/01/2016 1034   ALT 14 06/01/2016 1034   BILITOT 0.23 06/01/2016 1034       RADIOGRAPHIC STUDIES: Ct Chest W Contrast  Result Date: 06/05/2016 CLINICAL DATA:  Lung cancer diagnosed September 2016 with chemotherapy radiation therapy complete. EXAM: CT CHEST WITH CONTRAST TECHNIQUE: Multidetector CT imaging of the chest was performed during intravenous contrast administration. CONTRAST:  85m ISOVUE-300 IOPAMIDOL (ISOVUE-300) INJECTION 61% COMPARISON:  CT  02/28/2016 FINDINGS: Cardiovascular: Coronary artery calcification and aortic atherosclerotic calcification. Mediastinum/Nodes: No axillary or supraclavicular adenopathy. Port in RIGHT chest wall. No mediastinal lymphadenopathy. 8 mm prevascular lymph node unchanged. There is thickening of esophagus throughout its course. Lungs/Pleura: Paraseptal emphysema in the upper lobes. There is a band of consolidation with this RIGHT suprahilar lung with air bronchograms not changed in shape compared to prior. No new nodularity. Postsurgical change consistent RIGHT upper lobectomy. LEFT lung is clear and hyperexpanded. Upper Abdomen: Adrenal glands normal. No focal hepatic lesion limited view liver. No upper abdominal adenopathy. Musculoskeletal: No aggressive osseous lesion. IMPRESSION: 1. Postsurgical change and postradiation change in the RIGHT upper lobe without evidence of local recurrence. 2. No evidence of metastatic disease in thorax. 3. Diffuse thickening of esophagus may relate to radiation treatment. Electronically Signed   By: SSuzy BouchardM.D.   On: 06/05/2016 08:25   Dg Esophagus  Result Date: 05/19/2016 CLINICAL DATA:  Esophageal dysphagia. Cough. Gastroesophageal reflux. Previous radiation therapy for right lung carcinoma. EXAM: ESOPHOGRAM / BARIUM SWALLOW / BARIUM TABLET STUDY TECHNIQUE: Combined double contrast  and single contrast examination performed using effervescent crystals, thick barium liquid, and thin barium liquid. The patient was observed with fluoroscopy swallowing a 13 mm barium sulphate tablet. FLUOROSCOPY TIME:  Fluoroscopy Time: 4 minutes 0 seconds ; low dose pulsed fluoroscopy Radiation Exposure Index (if provided by the fluoroscopic device): 44.4 mGy Number of Acquired Spot Images: 0 COMPARISON:  None. FINDINGS: No evidence of vestibular penetration or aspiration seen during swallowing. Smoothly tapered stricture is seen involving the proximal esophagus at the level of the thoracic  inlet. This prevents passage of a 13 mm barium tablet, which remains stuck at this location. A smooth stricture is also seen involving the distal thoracic esophagus. There is no evidence of esophageal dilatation. No evidence of hiatal hernia. Moderate esophageal dysmotility is seen with lack of primary peristalsis resulting in esophageal stasis. No gastroesophageal reflux seen despite provocative maneuvers. 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 Similar smooth stricture also seen in the distal thoracic esophagus. Consider endoscopy for further evaluation. Moderate esophageal dysmotility. Electronically Signed   By: Earle Gell M.D.   On: 05/19/2016 12:35    ASSESSMENT AND PLAN:  This is a very pleasant 71 years old African-American male with a stage IIIa non-small cell lung cancer, squamous cell carcinoma status post concurrent chemoradiation followed by consolidation chemotherapy and has been on observation since that time. The patient had repeat CT scan of the chest performed recently. The scan showed no evidence for disease recurrence. I discussed the scan results with the patient and his sister. I recommended for him to continue on observation with repeat CT scan of the chest in 4 months. For the dysphagia is currently evaluated by gastroenterology. He was advised to call immediately if he has any concerning symptoms in the interval. The patient voices understanding of current disease status and treatment options and is in agreement with the current care plan.  All questions were answered. The patient knows to call the clinic with any problems, questions or concerns. We can certainly see the patient much sooner if necessary. I spent 10 minutes counseling the patient face to face. The total time spent in the appointment was 15 minutes. Disclaimer: This note was dictated with voice  recognition software. Similar sounding words can inadvertently be transcribed and may not be corrected upon review.

## 2016-06-07 ENCOUNTER — Telehealth: Payer: Self-pay | Admitting: Emergency Medicine

## 2016-06-07 ENCOUNTER — Telehealth: Payer: Self-pay | Admitting: Pulmonary Disease

## 2016-06-07 ENCOUNTER — Encounter: Payer: Self-pay | Admitting: Gastroenterology

## 2016-06-07 ENCOUNTER — Ambulatory Visit (INDEPENDENT_AMBULATORY_CARE_PROVIDER_SITE_OTHER): Payer: Medicare Other | Admitting: Gastroenterology

## 2016-06-07 VITALS — BP 112/70 | HR 88 | Ht 71.0 in | Wt 145.6 lb

## 2016-06-07 DIAGNOSIS — R933 Abnormal findings on diagnostic imaging of other parts of digestive tract: Secondary | ICD-10-CM | POA: Diagnosis not present

## 2016-06-07 DIAGNOSIS — R1310 Dysphagia, unspecified: Secondary | ICD-10-CM | POA: Diagnosis not present

## 2016-06-07 DIAGNOSIS — K222 Esophageal obstruction: Secondary | ICD-10-CM

## 2016-06-07 DIAGNOSIS — Z7902 Long term (current) use of antithrombotics/antiplatelets: Secondary | ICD-10-CM | POA: Diagnosis not present

## 2016-06-07 NOTE — Telephone Encounter (Addendum)
06/07/2016   RE: JERROD DAMIANO DOB: 1945/07/21 MRN: [id number]   Dear Priscille Loveless, FNP,    We have scheduled the above patient for an endoscopic procedure. Our records show that he is on anticoagulation therapy.   Please advise as to how long the patient may come off his therapy of Plavix prior to the procedure, which is scheduled for 06-26-16.  Please fax back/ or route the completed form to Tinnie Gens, [place].   Sincerely,    Tinnie Gens, CMA    48 hours prior to the procedure is fine. Thank you for updating me of this!

## 2016-06-07 NOTE — Progress Notes (Signed)
Complex patient with multiple significant medical problems. Sent here for dysphagia. Consultation note reviewed. 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 Agree with plans for EGD. Confirm with prescribing provider acceptability of holding Plavix short-term to avoid procedural related bleeding as he will likely need dilation

## 2016-06-07 NOTE — Patient Instructions (Addendum)

## 2016-06-07 NOTE — Telephone Encounter (Signed)
Sister is needing the report for pt's DG esophagus. The report was printed and given to her nothing further is needed at this time.

## 2016-06-07 NOTE — Progress Notes (Addendum)
06/07/2016 Ivan Daniels [id number] Sep 06, 1945   HISTORY OF PRESENT ILLNESS:  This is a pleasant 71 year old male with non-small cell lung cancer diagnosed in 01/2015 who was treated with chemo and radiation.  No longer on treatment currently.  Here today with his sister at the request of Dr. Earlie Server with complaints of dysphagia.  Dysphagia was present before radiation but has progressively worsened.  His sister says that it has probably been present for almost 2 years.  Says that prior to the cancer diagnosis she was having to crush his pills, etc.  Occurs mostly with solid and better on a soft diet but also sometimes with liquids as well.  Points high in his esophagus when describing his symptoms and says that things get stuck in his "throat".  Denies heartburn/reflux.  Says that prior to 2 or so years ago he never had any similar issues; never needed esophagus dilated, etc.  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 Similar smooth stricture also seen in the distal thoracic esophagus. Consider endoscopy for further evaluation.  Moderate esophageal dysmotility.  Is on Plavix for carotid artery stenosis that is prescribed by his PCP.    Also reports constipation for several years, which his sister thinks is from his mediations.  Is currently using Miralax prn with good results.  No blood in stools.  Has GI history with [facility].  We have sent release to try to obtain records.  Had colonoscopy with them but cannot remember details.   Past Medical History:  Diagnosis Date  . ALCOHOL ABUSE 11/12/2009   Qualifier: Diagnosis of  By: Hassell Done FNP, Tori Milks    . Allergy   . BENIGN PROSTATIC HYPERTROPHY, HX OF 12/19/2006   Qualifier: Diagnosis of  By: Radene Ou MD, [place]    . Emphysema of lung ([place] [facility])   . Full code  status 02/10/2015  . Hyperlipidemia   . Lung mass 01/25/2015   3.7 x 3.6 cm RUL spiculated lung mass with 2.0 cm right paratracheal lymph node suspicious for bronchogenic CA  . Non-small cell lung cancer ([place]) 01/27/15   non small-cell,squamous cell ca RUL  . Paranoid schizophrenia ([facility]) 10/31/2009   Qualifier: Diagnosis of  By: Jorene Minors, [place]    . Primary spontaneous pneumothorax, left 01/25/2015  . TOBACCO ABUSE 05/24/2009   Qualifier: Diagnosis of  By: Hassell Done FNP, Tori Milks     Past Surgical History:  Procedure Laterality Date  . APPENDECTOMY    . CHEST TUBE INSERTION Left   . INSERTION CENTRAL VENOUS ACCESS DEVICE W/ SUBCUTANEOUS PORT Right   . LUNG BIOPSY Right 01/27/2015   Procedure: Right Upper Lobe Bronchus BIOPSY;  Surgeon: Grace Isaac, MD;  Location: [facility];  Service: Thoracic;  Laterality: Right;  Marland Kitchen VIDEO BRONCHOSCOPY WITH ENDOBRONCHIAL ULTRASOUND N/A 01/27/2015   Procedure: VIDEO BRONCHOSCOPY WITH ENDOBRONCHIAL ULTRASOUND;  Surgeon: Grace Isaac, MD;  Location: [facility];  Service: Thoracic;  Laterality: N/A;    reports that he quit smoking about 17 months ago. His smoking use included Cigarettes. He has a 13.00 pack-year smoking history. He quit smokeless tobacco use about 15 months ago. He reports that he does not drink alcohol or use drugs. family history includes Breast cancer in his sister; Diabetes in his sister; Hyperlipidemia in his sister. Allergies  Allergen Reactions  .  Benztropine Mesylate     REACTION: decrease ROM neck. Pt states he is not allergic   . Chlorpromazine Hcl     unknown      Outpatient Encounter Prescriptions as of 06/07/2016  Medication Sig  . benztropine (COGENTIN) 0.5 MG tablet Take 0.5 mg by mouth at bedtime.   . chlorhexidine (PERIDEX) 0.12 % solution 1 mL by Mouth Rinse route See admin instructions.  . clopidogrel (PLAVIX) 75 MG tablet Take 75 mg by mouth daily.   . ferrous fumarate (HEMOCYTE - 106 MG FE) 325 (106 FE) MG TABS tablet  Take 1 tablet by mouth daily.   . haloperidol (HALDOL) 5 MG tablet Take 5 mg by mouth at bedtime.   . lidocaine-prilocaine (EMLA) cream Apply 1 application topically as needed.  . Multiple Vitamins-Minerals (MULTIVITAMIN WITH MINERALS) tablet Take 1 tablet by mouth daily.  . prochlorperazine (COMPAZINE) 10 MG tablet Take 1 tablet (10 mg total) by mouth every 6 (six) hours as needed for nausea or vomiting.  . simvastatin (ZOCOR) 20 MG tablet Take 20 mg by mouth at bedtime.    No facility-administered encounter medications on file as of 06/07/2016.      REVIEW OF SYSTEMS  : All other systems reviewed and negative except where noted in the History of Present Illness.   PHYSICAL EXAM: BP 112/70   Pulse 88   Ht '5\' 11"'$  (1.803 m)   Wt 145 lb 9.6 oz (66 kg)   BMI 20.31 kg/m  General: Well developed AA male in no acute distress Head: Normocephalic and atraumatic Eyes:  Sclerae anicteric, conjunctiva pink. Ears: Normal auditory acuity Lungs: Clear throughout to auscultation; no increased WOB. Heart: Regular rate and rhythm Abdomen: Soft, non-distended. Normal bowel sounds.  Non-tender. Musculoskeletal: Symmetrical with no gross deformities  Skin: No lesions on visible extremities Extremities: No edema  Neurological: Alert oriented x 4, grossly non-focal Psychological:  Alert and cooperative. Normal mood and affect  ASSESSMENT AND PLAN: -71 year old male with non-small cell lung cancer diagnosed in 01/2015 who was treated with chemo and radiation.  Here with complaints of dysphagia.  Recent esophagram shows stricture at the proximal esophagus and also one at the distal thoracic esophagus.  The risks, benefits, and alternatives to EGD with dilation were discussed with the patient and he consents to proceed.  -Constipation:  Well controlled on Miralax prn.  Advised to increase to daily use if needed. -Chronic antiplatelet use with Plavix for carotid artery stenosis:  Hold Plavix for 5 days  before procedure - will instruct when and how to resume after procedure. Risks and benefits of procedure including bleeding, perforation, infection, missed lesions, medication reactions and possible hospitalization or surgery if complications occur explained. Additional rare but real risk of cardiovascular event such as heart attack or ischemia/infarct of other organs off Plavix explained and need to seek urgent help if this occurs. Will communicate by phone or EMR with patient's prescribing provider, Priscille Loveless, FNP, that to confirm holding Plavix is reasonable in this case.   *Trying to obtain previous GI records from [facility].  **Colonoscopy 03/2005 with Dr. Amedeo Plenty was normal.  CC:  Starkes, Gayland Curry, FNP

## 2016-06-12 ENCOUNTER — Ambulatory Visit (HOSPITAL_COMMUNITY): Payer: Medicare Other

## 2016-06-13 ENCOUNTER — Ambulatory Visit (INDEPENDENT_AMBULATORY_CARE_PROVIDER_SITE_OTHER): Payer: Medicare Other | Admitting: Pulmonary Disease

## 2016-06-13 DIAGNOSIS — J439 Emphysema, unspecified: Secondary | ICD-10-CM

## 2016-06-13 LAB — PULMONARY FUNCTION TEST
DL/VA % pred: 70 %
DL/VA: 3.28 ml/min/mmHg/L
DLCO cor % pred: 38 %
DLCO cor: 12.82 ml/min/mmHg
DLCO unc % pred: 35 %
DLCO unc: 11.91 ml/min/mmHg
FEF 25-75 Post: 1.16 L/sec
FEF 25-75 Pre: 1 L/sec
FEF2575-%Change-Post: 15 %
FEF2575-%Pred-Post: 44 %
FEF2575-%Pred-Pre: 38 %
FEV1-%Change-Post: 6 %
FEV1-%Pred-Post: 61 %
FEV1-%Pred-Pre: 57 %
FEV1-Post: 1.86 L
FEV1-Pre: 1.75 L
FEV1FVC-%Change-Post: 3 %
FEV1FVC-%Pred-Pre: 82 %
FEV6-%Change-Post: 2 %
FEV6-%Pred-Post: 73 %
FEV6-%Pred-Pre: 71 %
FEV6-Post: 2.81 L
FEV6-Pre: 2.74 L
FEV6FVC-%Change-Post: 0 %
FEV6FVC-%Pred-Post: 103 %
FEV6FVC-%Pred-Pre: 104 %
FVC-%Change-Post: 2 %
FVC-%Pred-Post: 70 %
FVC-%Pred-Pre: 68 %
FVC-Post: 2.82 L
FVC-Pre: 2.74 L
Post FEV1/FVC ratio: 66 %
Post FEV6/FVC ratio: 100 %
Pre FEV1/FVC ratio: 64 %
Pre FEV6/FVC Ratio: 100 %
RV % pred: 76 %
RV: 1.89 L
TLC % pred: 61 %
TLC: 4.44 L

## 2016-06-15 ENCOUNTER — Encounter: Payer: Self-pay | Admitting: Pulmonary Disease

## 2016-06-15 ENCOUNTER — Ambulatory Visit (INDEPENDENT_AMBULATORY_CARE_PROVIDER_SITE_OTHER): Payer: Medicare Other | Admitting: Pulmonary Disease

## 2016-06-15 VITALS — BP 120/80 | HR 96 | Ht 71.0 in | Wt 143.6 lb

## 2016-06-15 DIAGNOSIS — K222 Esophageal obstruction: Secondary | ICD-10-CM | POA: Diagnosis not present

## 2016-06-15 DIAGNOSIS — R0602 Shortness of breath: Secondary | ICD-10-CM | POA: Diagnosis not present

## 2016-06-15 DIAGNOSIS — J449 Chronic obstructive pulmonary disease, unspecified: Secondary | ICD-10-CM | POA: Insufficient documentation

## 2016-06-15 DIAGNOSIS — J439 Emphysema, unspecified: Secondary | ICD-10-CM

## 2016-06-15 DIAGNOSIS — C3490 Malignant neoplasm of unspecified part of unspecified bronchus or lung: Secondary | ICD-10-CM | POA: Diagnosis not present

## 2016-06-15 MED ORDER — UMECLIDINIUM-VILANTEROL 62.5-25 MCG/INH IN AEPB: 1.0000 | INHALATION_SPRAY | Freq: Every day | RESPIRATORY_TRACT | 0 refills | Status: DC

## 2016-06-15 NOTE — Addendum Note (Signed)
Addended by: Tyson Dense on: 06/15/2016 04:55 PM   Modules accepted: Orders

## 2016-06-15 NOTE — Progress Notes (Signed)
Test reviewed.  

## 2016-06-15 NOTE — Patient Instructions (Signed)
   Use this inhaler I am giving you today (Anoro) once daily at the same time every day.  If the Anoro inhaler seems to help your wheezing & cough please let me know and we will send in a prescription. If it does let me know and we will try something different.  Stop using the inhaler if you have any adverse reaction such as itching, a rash, trouble seeing, trouble peeing, etc.  I will see you back in 3 months or sooner if needed.

## 2016-06-15 NOTE — Progress Notes (Signed)
Subjective:    Patient ID: Ivan Daniels, male    DOB: 10-05-1945, 71 y.o.   MRN: [id number]  C.C.:  Follow-up for Moderate-Severe COPD w/ Emphysema, NSCLC Stage IIIA, & Esophageal Stricture.  HPI Moderate-Severe COPD w/ Emphysema: No evidence of alpha-1 antitrypsin deficiency. He has no significant dyspnea. He denies any noticeable dyspnea on exertion. Reports an intermittent, nonproductive cough as well as wheezing. Cough syrup doesn't seem to help.   NSCLC Stage IIIA: Treated in 2016 with concurrent chemotherapy and XRT. 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  Esophageal Stricture: Seen on barium swallow. Patient referred to GI for upper endoscopy. Most likely cause for dysphagia.  Review of Systems Does have some fatigue & malaise. Denies any chest tightness or pain. No fever or chills. No abdominal pain, nausea, or emesis.   Allergies  Allergen Reactions  . Benztropine Mesylate     REACTION: decrease ROM neck. Pt states he is not allergic   . Chlorpromazine Hcl     unknown    Current Outpatient Prescriptions on File Prior to Visit  Medication Sig Dispense Refill  . benztropine (COGENTIN) 0.5 MG tablet Take 0.5 mg by mouth at bedtime.     . chlorhexidine (PERIDEX) 0.12 % solution 1 mL by Mouth Rinse route See admin instructions.  1  . clopidogrel (PLAVIX) 75 MG tablet Take 75 mg by mouth daily.     . ferrous fumarate (HEMOCYTE - 106 MG FE) 325 (106 FE) MG TABS tablet Take 1 tablet by mouth daily.     . haloperidol (HALDOL) 5 MG tablet Take 5 mg by mouth at bedtime.     . lidocaine-prilocaine (EMLA) cream Apply 1 application topically as needed. 30 g 0  . Multiple Vitamins-Minerals (MULTIVITAMIN WITH MINERALS) tablet Take 1 tablet by mouth daily.    . prochlorperazine (COMPAZINE) 10 MG tablet Take 1 tablet (10 mg total) by mouth every 6 (six) hours as needed for nausea or vomiting. 30 tablet 0  . simvastatin (ZOCOR) 20 MG tablet Take 20 mg by mouth at bedtime.      No current  facility-administered medications on file prior to visit.     Past Medical History:  Diagnosis Date  . ALCOHOL ABUSE 11/12/2009   Qualifier: Diagnosis of  By: Hassell Done FNP, Tori Milks    . Allergy   . BENIGN PROSTATIC HYPERTROPHY, HX OF 12/19/2006   Qualifier: Diagnosis of  By: Radene Ou MD, [place]    . Emphysema of lung ([place])   . Full code status 02/10/2015  . Hyperlipidemia   . Lung mass 01/25/2015   3.7 x 3.6 cm RUL spiculated lung mass with 2.0 cm right paratracheal lymph node suspicious for bronchogenic CA  . Non-small cell lung cancer ([place]) 01/27/15   non small-cell,squamous cell ca RUL  . Paranoid schizophrenia ([place]) 10/31/2009   Qualifier: Diagnosis of  By: Jorene Minors, [place]    . Primary spontaneous pneumothorax, left 01/25/2015  . TOBACCO ABUSE 05/24/2009   Qualifier: Diagnosis of  By: Hassell Done FNP, Tori Milks      Past Surgical History:  Procedure Laterality Date  . APPENDECTOMY    . CHEST TUBE INSERTION Left   . INSERTION CENTRAL VENOUS ACCESS DEVICE W/ SUBCUTANEOUS PORT Right   . LUNG BIOPSY Right 01/27/2015   Procedure: Right Upper Lobe Bronchus BIOPSY;  Surgeon: Grace Isaac, MD;  Location: [facility];  Service: Thoracic;  Laterality: Right;  Marland Kitchen VIDEO BRONCHOSCOPY WITH ENDOBRONCHIAL ULTRASOUND N/A 01/27/2015   Procedure: VIDEO BRONCHOSCOPY WITH ENDOBRONCHIAL ULTRASOUND;  Surgeon: Grace Isaac, MD;  Location: [facility];  Service: Thoracic;  Laterality: N/A;    Family History  Problem Relation Age of Onset  . Breast cancer Sister   . Diabetes Sister   . Hyperlipidemia Sister   . Lung disease Neg Hx   . Colon cancer Neg Hx   . Esophageal cancer Neg Hx   . Stomach cancer Neg Hx   . Pancreatic cancer Neg Hx   . Liver disease Neg Hx     Social History   Social History  . Marital status: Single    Spouse name: N/A  . Number of children: N/A  . Years of education: N/A   Social History Main Topics  . Smoking status: Former Smoker    Packs/day: 0.25    Years: 52.00     Types: Cigarettes    Quit date: 01/07/2015  . Smokeless tobacco: Former Systems developer    Quit date: 03/01/2015     Comment: Peak rate of 4-5 cigarettes per day  . Alcohol use No  . Drug use: No  . Sexual activity: No   Other Topics Concern  . None   Social History Narrative   [facility] (05/12/16):   Originally from [facility]. Has always lived in [place]. 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 No known asbestos exposure. Significant dust and cotton dust exposure. No pets currently. No bird or mold exposure.       Objective:   Physical Exam BP 120/80 (BP Location: Right Arm, Patient Position: Sitting, Cuff Size: Normal)   Pulse 96   Ht '5\' 11"'$  (1.803 m)   Wt 143 lb 9.6 oz (65.1 kg)   SpO2 98%   BMI 20.03 kg/m   General:  Awake. No distress. Thin male. Integument: No rash on exposed skin. No bruising on exposed skin.warm and dry.  Extremities:  No cyanosis or clubbing.  HEENT:  Moist mucus membranes. No significant nasal turbinate swelling. No oral ulcers.  Cardiovascular:  Regular rate. No edema.Normal S1 & S2. Pulmonary:  Minimal wheezing bilaterally. Otherwise good aeration. No accessory muscle use on room air. Abdomen: Soft. Normal bowel sounds. Nondistended.  Musculoskeletal:  Normal bulk and tone. No joint deformity or effusion appreciated.  PFT 06/13/16: FVC 2.74 L (68%) FEV1 1.75 (57%) FEV1/FVC is 0.64 FEF 25-75 1.00) 3%) negative bronchodilator response TLC 4.44 L (61%) RV 76% ERV 122% DLCO corrected 38%  6MWT 06/15/16:  Walked 222 meters / Baseline Sat 98% on RA / Nadir Sat 97% on RA 2 minutes after walk  IMAGING ESOPHAGRAM/BARIUM SWALLOW 05/19/16 (per radiologist):  Smoothly tapered stricture of the proximal esophagus at the level of the thoracic inlet preventing passage of barium tablet. Moderate esophageal dysmotility.  CT CHEST W/ CONTRAST 02/28/16 (previously reviewed by me): Atypical predominant emphysema & subpleural bleb formation. Right hilar consolidation  suggesting fibrotic change with continued stability. Hyperexpansion of left lung volume loss on the right. No new nodule or opacity appreciated. No pathologic mediastinal adenopathy. No pleural effusion or thickening. No pericardial effusion.  PATHOLOGY Right Upper Lobe Bronchial Brush (01/27/15):  Squamous Cell Carcinoma Right Upper Lobe Endobronchial Biopsy (01/27/15):  Squamous Cell Carcinoma EBUS Level 7 FNA (01/27/15):  Squamous Cell Carcinoma   LABS 05/12/16 Alpha-1 antitrypsin: MM (151)    Assessment & Plan:  71 y.o. male with moderate-severe COPD with emphysema, non-small cell lung cancer stage IIIa (squamous cell carcinoma), & esophageal stricture.  Patient's spirometry today shows moderate-severe airway obstruction with no significant bronchodilator response.  Given the patient's ongoing wheezing and cough I feel it's reasonable to try him on inhaled medication therapy and with his underlying emphysema I will try a combo medication first without inhaled corticosteroids. We did review his esophagram findings of an esophageal stricture and I recommended soft foods and liquids for sustenance until he can see GI. I instructed the patient and his sister to contact me if there are any questions or problems with his new inhaler.   1. Moderate-Severe COPD w/ Emphysema:  Trying patient on Anoro Ellipta. Patient and sister to notify me of clinical improvement or lack thereof. 2. NSCLC Stage IIIA: Squamous cell carcinoma. Continuing to follow with medical oncology. 3. Esophageal Stricture: Patient scheduled for upper endoscopy/GI evaluation on 2/19. Recommended continued soft foods and thin liquids for now. 4. Health Maintenance:  S/P Prevnar Vaccine January 2018, Influenza Vaccine October 2017 & Pneumovax 29 December 2002.  5. Follow-up:  Return to clinic in 3 months or sooner if needed.  Sonia Baller Ashok Cordia, M.D. [facility] Pager:  [phone] After 3pm or if no response, call  [phone] 4:11 PM 06/15/16

## 2016-06-19 NOTE — Telephone Encounter (Signed)
Left detailed message on patient's voice mail.

## 2016-06-23 ENCOUNTER — Telehealth: Payer: Self-pay | Admitting: *Deleted

## 2016-06-23 NOTE — Telephone Encounter (Signed)
Patient's call came straight into admitting department and Waldo Laine, RN answered the call.  Patient's sister Evon Slack was calling regarding his medication Plavix and all other medications.  Tinnie Gens, CMA was called and she found where is was documented to hold Plavix x 48 hours.  I called patient's sister and advised her of this and also that it would be okay to take all other medications as prescribed as long as it was prior to cut off time to drink.  She states that she gives his medication crushed up in applesauce.  I advised that he could not have applesauce and said he could just wait until after procedure to give the medications to him.  All questions were answered and given to sister.

## 2016-06-26 ENCOUNTER — Ambulatory Visit (AMBULATORY_SURGERY_CENTER): Payer: Medicare Other | Admitting: Internal Medicine

## 2016-06-26 ENCOUNTER — Encounter: Payer: Self-pay | Admitting: Internal Medicine

## 2016-06-26 VITALS — BP 116/68 | HR 96 | Temp 98.2°F | Resp 16 | Ht 71.0 in | Wt 145.0 lb

## 2016-06-26 DIAGNOSIS — R1310 Dysphagia, unspecified: Secondary | ICD-10-CM | POA: Diagnosis not present

## 2016-06-26 DIAGNOSIS — R933 Abnormal findings on diagnostic imaging of other parts of digestive tract: Secondary | ICD-10-CM

## 2016-06-26 DIAGNOSIS — K222 Esophageal obstruction: Secondary | ICD-10-CM

## 2016-06-26 DIAGNOSIS — J449 Chronic obstructive pulmonary disease, unspecified: Secondary | ICD-10-CM | POA: Diagnosis not present

## 2016-06-26 DIAGNOSIS — I6529 Occlusion and stenosis of unspecified carotid artery: Secondary | ICD-10-CM | POA: Diagnosis not present

## 2016-06-26 MED ORDER — SODIUM CHLORIDE 0.9 % IV SOLN: 500.0000 mL | INTRAVENOUS | Status: AC

## 2016-06-26 NOTE — Progress Notes (Signed)
Pt's sister reported he last drank liquids yesterday 1700.  Pt last ate solid foods wednedsy 06-22-16 at 1700.  I corrected in the pre-assessment section. maw

## 2016-06-26 NOTE — Op Note (Signed)
Ivan Daniels Patient Name: Ivan Daniels Procedure Date: 06/26/2016 11:50 AM MRN: [id number] Endoscopist: Docia Chuck. Henrene Pastor , MD Age: 71 Referring MD:  Date of Birth: 1945/06/21 Gender: Male Account #: [id number] Procedure:                Upper GI endoscopy, with disruption of proximal                            stricture Indications:              Dysphagia, Abnormal cine-esophagram Medicines:                Monitored Anesthesia Care Procedure:                Pre-Anesthesia Assessment:                           - Prior to the procedure, a History and Physical                            was performed, and patient medications and                            allergies were reviewed. The patient's tolerance of                            previous anesthesia was also reviewed. The risks                            and benefits of the procedure and the sedation                            options and risks were discussed with the patient.                            All questions were answered, and informed consent                            was obtained. Prior Anticoagulants: The patient has                            taken Plavix (clopidogrel), last dose was 1 day                            prior to procedure. ASA Grade Assessment: III - A                            patient with severe systemic disease. After                            reviewing the risks and benefits, the patient was                            deemed in satisfactory condition to undergo the  procedure.                           After obtaining informed consent, the endoscope was                            passed under direct vision. Throughout the                            procedure, the patient's blood pressure, pulse, and                            oxygen saturations were monitored continuously. The                            Model GIF-HQ190 [id number]) scope was introduced              through the mouth, and advanced to the second part                            of duodenum. The upper GI endoscopy was                            accomplished without difficulty. The patient                            tolerated the procedure well. Scope In: Scope Out: Findings:                 Two moderate benign-appearing, intrinsic stenoses                            were found. The first was in the proximal esophagus                            at 18 cm from the incisors and measured                            approximately 9 mm. This was disrupted with the                            endoscope. A second stricture was at the                            gastroesophageal junction measured approximately 15                            mm. There was associated mild esophagitis.                           The entire examined stomach was normal including                            retroflexed view.  The examined duodenum was normal. Complications:            No immediate complications. Estimated Blood Loss:     Estimated blood loss: none. Impression:               - Benign-appearing esophageal stenoses s described.                           - Normal stomach.                           - Normal examined duodenum.                           - NO DILATION TODAY BECAUSE THE PATIENT REPORTS                            TAKING PLAVIX AS RECENTLY AS YESTERDAY. Recommendation:           - Patient has a contact number available for                            emergencies. The signs and symptoms of potential                            delayed complications were discussed with the                            patient. Return to normal activities tomorrow.                            Written discharge instructions were provided to the                            patient.                           - Resume previous diet.                           - Continue present medications.                            - RESCHEDULE EGD WITH DILATION IN [facility]. THE PATIENT                            WILL NEED TO BE OFF PLAVIX 1 WEEK PRIOR TO                            EXAMINATION AS HE WILL NEED DILATION THERAPY.                            DISCUSSED WITH SISTER Docia Chuck. Henrene Pastor, MD 06/26/2016 12:25:07 PM This report has been signed electronically.

## 2016-06-26 NOTE — Progress Notes (Signed)
Pt brought his anoro inhaler - labeled with his name at the bed side.  Pt's dentures are at home.  His last dose of PLAVIX was thurs, Jun 22, 2016 reported by pt's sister.  She helps take care of pt and she helps pt with his medicine.  She helped pt with his history, meds and general questions.  Pt oked her helping.  maw

## 2016-06-26 NOTE — Patient Instructions (Signed)
Impression/Recommendations:  Reschedule EGD with dilation.   Be off Plavix for 7 days prior to examination.  EGD scheduled for Feb. 22, 2018 at 2:30.  Please arrive at 1:30 p.m., and check-in on the 4th floor.  Pre-visit is scheduled for tomorrow, Tuesday Feb. 20, 2018 at 2:30 p.m.   Please arrive at 2:15, and check-in on the 3rd floor.  YOU HAD AN ENDOSCOPIC PROCEDURE TODAY AT [facility]:   Refer to the procedure report that was given to you for any specific questions about what was found during the examination.  If the procedure report does not answer your questions, please call your gastroenterologist to clarify.  If you requested that your care partner not be given the details of your procedure findings, then the procedure report has been included in a sealed envelope for you to review at your convenience later.  YOU SHOULD EXPECT: Some feelings of bloating in the abdomen. Passage of more gas than usual.  Walking can help get rid of the air that was put into your GI tract during the procedure and reduce the bloating. If you had a lower endoscopy (such as a colonoscopy or flexible sigmoidoscopy) you may notice spotting of blood in your stool or on the toilet paper. If you underwent a bowel prep for your procedure, you may not have a normal bowel movement for a few days.  Please Note:  You might notice some irritation and congestion in your nose or some drainage.  This is from the oxygen used during your procedure.  There is no need for concern and it should clear up in a day or so.  SYMPTOMS TO REPORT IMMEDIATELY:  Following upper endoscopy (EGD)  Vomiting of blood or coffee ground material  New chest pain or pain under the shoulder blades  Painful or persistently difficult swallowing  New shortness of breath  Fever of 100F or higher  Black, tarry-looking stools  For urgent or emergent issues, a gastroenterologist can be reached at any hour by calling [phone]  [phone].   DIET:  We do recommend a small meal at first, but then you may proceed to your regular diet.  Drink plenty of fluids but you should avoid alcoholic beverages for 24 hours.  ACTIVITY:  You should plan to take it easy for the rest of today and you should NOT DRIVE or use heavy machinery until tomorrow (because of the sedation medicines used during the test).    FOLLOW UP: Our staff will call the number listed on your records the next business day following your procedure to check on you and address any questions or concerns that you may have regarding the information given to you following your procedure. If we do not reach you, we will leave a message.  However, if you are feeling well and you are not experiencing any problems, there is no need to return our call.  We will assume that you have returned to your regular daily activities without incident.  If any biopsies were taken you will be contacted by phone or by letter within the next 1-3 weeks.  Please call us at [phone] if you have not heard about the biopsies in 3 weeks.    SIGNATURES/CONFIDENTIALITY: You and/or your care partner have signed paperwork which will be entered into your electronic medical record.  These signatures attest to the fact that that the information above on your After Visit Summary has been reviewed and is understood.  Full responsibility of the  confidentiality of this discharge information lies with you and/or your care-partner. 

## 2016-06-26 NOTE — Progress Notes (Signed)
Report given to PACU, vss 

## 2016-06-27 ENCOUNTER — Telehealth: Payer: Self-pay | Admitting: *Deleted

## 2016-06-27 ENCOUNTER — Ambulatory Visit (AMBULATORY_SURGERY_CENTER): Payer: Self-pay

## 2016-06-27 VITALS — Ht 71.0 in | Wt 142.2 lb

## 2016-06-27 DIAGNOSIS — R1310 Dysphagia, unspecified: Secondary | ICD-10-CM

## 2016-06-27 DIAGNOSIS — R1319 Other dysphagia: Secondary | ICD-10-CM

## 2016-06-27 NOTE — Telephone Encounter (Signed)
No Answer left message and will attempt to call back later this afternoon SM

## 2016-06-27 NOTE — Telephone Encounter (Signed)
  Follow up Call-  Call back number 06/26/2016  Post procedure Call Back phone  # #[phone]  Some recent data might be hidden    Called [phone] Patient questions:  Do you have a fever, pain , or abdominal swelling? No. Pain Score  0 *  Have you tolerated food without any problems? Yes.    Have you been able to return to your normal activities? Yes.    Do you have any questions about your discharge instructions: Diet   No. Medications  No. Follow up visit  No.  Do you have questions or concerns about your Care? No.  Actions: * If pain score is 4 or above: No action needed, pain <4.

## 2016-06-27 NOTE — Progress Notes (Signed)
Per pt, no allergies to soy or egg products.Pt not taking any weight loss meds or using  O2 at home.  Dorothy (sister) was with the pt at his pre-visit to help with instructions. She will be with the pt on 06/29/16 to help with paperwork or any questions.

## 2016-06-29 ENCOUNTER — Ambulatory Visit (AMBULATORY_SURGERY_CENTER): Payer: Medicare Other | Admitting: Internal Medicine

## 2016-06-29 ENCOUNTER — Encounter: Payer: Self-pay | Admitting: Internal Medicine

## 2016-06-29 VITALS — BP 122/79 | HR 95 | Temp 98.6°F | Resp 16 | Ht 71.0 in | Wt 142.0 lb

## 2016-06-29 DIAGNOSIS — R1319 Other dysphagia: Secondary | ICD-10-CM

## 2016-06-29 DIAGNOSIS — K222 Esophageal obstruction: Secondary | ICD-10-CM | POA: Diagnosis not present

## 2016-06-29 DIAGNOSIS — R1310 Dysphagia, unspecified: Secondary | ICD-10-CM | POA: Diagnosis not present

## 2016-06-29 MED ORDER — SODIUM CHLORIDE 0.9 % IV SOLN: 500.0000 mL | INTRAVENOUS | Status: AC

## 2016-06-29 NOTE — Patient Instructions (Signed)
YOU HAD AN ENDOSCOPIC PROCEDURE TODAY AT [facility]:   Refer to the procedure report that was given to you for any specific questions about what was found during the examination.  If the procedure report does not answer your questions, please call your gastroenterologist to clarify.  If you requested that your care partner not be given the details of your procedure findings, then the procedure report has been included in a sealed envelope for you to review at your convenience later.  YOU SHOULD EXPECT: Some feelings of bloating in the abdomen. Passage of more gas than usual.  Walking can help get rid of the air that was put into your GI tract during the procedure and reduce the bloating.  Please Note:  You might notice some irritation and congestion in your nose or some drainage.  This is from the oxygen used during your procedure.  There is no need for concern and it should clear up in a day or so.  SYMPTOMS TO REPORT IMMEDIATELY:  Following upper endoscopy (EGD)  Vomiting of blood or coffee ground material  New chest pain or pain under the shoulder blades  Painful or persistently difficult swallowing  New shortness of breath  Fever of 100F or higher  Black, tarry-looking stools  For urgent or emergent issues, a gastroenterologist can be reached at any hour by calling [phone].   DIET:  Follow dilation diet- see handout. Push fluids and no alcohol for 24 hours  ACTIVITY:  You should plan to take it easy for the rest of today and you should NOT DRIVE or use heavy machinery until tomorrow (because of the sedation medicines used during the test).    FOLLOW UP: Our staff will call the number listed on your records the next business day following your procedure to check on you and address any questions or concerns that you may have regarding the information given to you following your procedure. If we do not reach you, we will leave a message.  However, if you are feeling  well and you are not experiencing any problems, there is no need to return our call.  We will assume that you have returned to your regular daily activities without incident.  SIGNATURES/CONFIDENTIALITY: You and/or your care partner have signed paperwork which will be entered into your electronic medical record.  These signatures attest to the fact that that the information above on your After Visit Summary has been reviewed and is understood.  Full responsibility of the confidentiality of this discharge information lies with you and/or your care-partner.  FOLLOW DILATION DIET- SEE HANDOUT.  CONTINUE EATING SOFT FOODS UNTIL Saturday  Continue to be very careful with medications  Resume Plavix in 2 days  Continue other medications

## 2016-06-29 NOTE — Progress Notes (Signed)
A/ox3 pleased with MAC, report to [employer]

## 2016-06-29 NOTE — Op Note (Signed)
[facility] Patient Name: Ivan Daniels Procedure Date: 06/29/2016 2:32 PM MRN: [id number] Endoscopist: Docia Chuck. Henrene Pastor , MD Age: 71 Referring MD:  Date of Birth: 1945/10/01 Gender: Male Account #: [id number] Procedure:                Upper GI endoscopy, with balloon dilation of the                            esophagus Indications:              Dysphagia. Known complex stricturing of the                            esophagus.Had EGD 3 days ago. Now off Plavix for 5                            days Medicines:                Monitored Anesthesia Care Procedure:                Pre-Anesthesia Assessment:                           - Prior to the procedure, a History and Physical                            was performed, and patient medications and                            allergies were reviewed. The patient's tolerance of                            previous anesthesia was also reviewed. The risks                            and benefits of the procedure and the sedation                            options and risks were discussed with the patient.                            All questions were answered, and informed consent                            was obtained. Prior Anticoagulants: The patient has                            taken Plavix (clopidogrel), last dose was 5 days                            prior to procedure. ASA Grade Assessment: III - A                            patient with severe systemic disease. After  reviewing the risks and benefits, the patient was                            deemed in satisfactory condition to undergo the                            procedure.                           After obtaining informed consent, the endoscope was                            passed under direct vision. Throughout the                            procedure, the patient's blood pressure, pulse, and                            oxygen saturations were  monitored continuously. The                            Model GIF-HQ190 [id number]) scope was introduced                            through the mouth, and advanced to the second part                            of duodenum. The upper GI endoscopy was                            accomplished without difficulty. The patient                            tolerated the procedure well. Scope In: Scope Out: Findings:                 Multiple moderate benign-appearing, intrinsic                            stenoses were found. The tightest was in the                            proximal esophagus at approximately 19 cm. This                            measured approximately 10 mm in diameter. There are                            also multiple partial webs or rings below as well                            as a more classic-appearing peptic stricture with                            mild esophagitis at the  gastroesophageal junction                            which measured approximately 14 mm.. And they were                            traversed. A TTS dilator was passed through the                            scope. Dilation with a sequential balloon dilator                            was performed . The stricture at the                            gastroesophageal junction was dilated up to 15 mm.                            The balloon was pulled proximal for a few                            centimeters. The stricture and partial webs in the                            distal esophagus were disrupted without significant                            bleeding. The balloon was deflated and carefully                            insufflated in the more proximal esophagus. Over                            time able to achieve a diameter of about 14.5 mm.                            There was superficial linear tear across the                            proximal strictures with self-limited bleeding. The                             dilation site was examined following endoscope                            reinsertion and showed moderate improvement in                            luminal narrowing. Estimated blood loss was minimal.                           The entire examined stomach was normal.  A single angiodysplastic lesion was found in the                            duodenal bulb.                           The exam of the duodenum was otherwise normal. Complications:            No immediate complications. Estimated Blood Loss:     Estimated blood loss: none. Impression:               - Benign-appearing complex esophageal stenoses,                            radiation and peptic. Dilated.                           - Normal stomach.                           - A single angiodysplastic lesion in the duodenum.                           - No specimens collected. Recommendation:           - Patient has a contact number available for                            emergencies. The signs and symptoms of potential                            delayed complications were discussed with the                            patient. Return to normal activities tomorrow.                            Written discharge instructions were provided to the                            patient.                           - Post-dilation diet then soft foods tomorrow.                            Continue to be very careful with medications.                           - Continue present medications.                           - Resume Plavix (clopidogrel) at prior dose in 2                            days.                           -  Schedule repeat upper endoscopy witageal dilation                            with Dr. Henrene Pastor in about 4 weeks. PLEASE HOLD PLAVIX                            1 WEEK PRIOR TO THE NEXT PROCEDURE DAY Shanera Meske N. Henrene Pastor, MD 06/29/2016 3:05:49 PM This report has been signed electronically.

## 2016-06-29 NOTE — Progress Notes (Signed)
Called to room to assist during endoscopic procedure.  Patient ID and intended procedure confirmed with present staff. Received instructions for my participation in the procedure from the performing physician.  

## 2016-06-29 NOTE — Progress Notes (Signed)
Pt did cough up a small amt of blood after procedure.  Right before discharge, pt did start passing a small amt of blood via nose.  Dr. Henrene Pastor made aware- he advised pt to drink water and see how he does.  Able to drink water without difficulty.  Ok to discharge per Dr. Henrene Pastor

## 2016-06-30 ENCOUNTER — Telehealth: Payer: Self-pay | Admitting: *Deleted

## 2016-06-30 ENCOUNTER — Telehealth: Payer: Self-pay | Admitting: Internal Medicine

## 2016-06-30 NOTE — Telephone Encounter (Signed)
No answer left message and told the patient to call with any questions or concerns, SM

## 2016-06-30 NOTE — Telephone Encounter (Signed)
No answer, message left for the patient. 

## 2016-06-30 NOTE — Telephone Encounter (Signed)
Discussed EGD report with pts sister that states pt to resume plavix in 2 days, pt verbalized understanding.

## 2016-07-10 ENCOUNTER — Ambulatory Visit (AMBULATORY_SURGERY_CENTER): Payer: Self-pay

## 2016-07-10 VITALS — Ht 71.0 in | Wt 144.6 lb

## 2016-07-10 DIAGNOSIS — R1310 Dysphagia, unspecified: Secondary | ICD-10-CM

## 2016-07-10 NOTE — Progress Notes (Signed)
Denies allergies to eggs or soy products. Denies complication of anesthesia or sedation. Denies use of weight loss medication. Denies use of O2.   Emmi instructions declined.  

## 2016-07-18 ENCOUNTER — Ambulatory Visit (AMBULATORY_SURGERY_CENTER): Payer: Medicare Other | Admitting: Internal Medicine

## 2016-07-18 ENCOUNTER — Encounter: Payer: Self-pay | Admitting: Internal Medicine

## 2016-07-18 ENCOUNTER — Encounter: Payer: Medicare Other | Admitting: Internal Medicine

## 2016-07-18 VITALS — BP 142/72 | HR 88 | Temp 97.1°F | Resp 17 | Ht 71.0 in | Wt 144.0 lb

## 2016-07-18 DIAGNOSIS — R1310 Dysphagia, unspecified: Secondary | ICD-10-CM

## 2016-07-18 DIAGNOSIS — Z85118 Personal history of other malignant neoplasm of bronchus and lung: Secondary | ICD-10-CM | POA: Diagnosis not present

## 2016-07-18 DIAGNOSIS — K222 Esophageal obstruction: Secondary | ICD-10-CM

## 2016-07-18 DIAGNOSIS — K219 Gastro-esophageal reflux disease without esophagitis: Secondary | ICD-10-CM | POA: Diagnosis not present

## 2016-07-18 MED ORDER — SODIUM CHLORIDE 0.9 % IV SOLN
500.0000 mL | INTRAVENOUS | Status: AC
Start: 2016-07-18 — End: ?

## 2016-07-18 NOTE — Patient Instructions (Signed)
YOU HAD AN ENDOSCOPIC PROCEDURE TODAY AT [facility]:   Refer to the procedure report that was given to you for any specific questions about what was found during the examination.  If the procedure report does not answer your questions, please call your gastroenterologist to clarify.  If you requested that your care partner not be given the details of your procedure findings, then the procedure report has been included in a sealed envelope for you to review at your convenience later.  YOU SHOULD EXPECT: Some feelings of bloating in the abdomen. Passage of more gas than usual.  Walking can help get rid of the air that was put into your GI tract during the procedure and reduce the bloating. If you had a lower endoscopy (such as a colonoscopy or flexible sigmoidoscopy) you may notice spotting of blood in your stool or on the toilet paper. If you underwent a bowel prep for your procedure, you may not have a normal bowel movement for a few days.  Please Note:  You might notice some irritation and congestion in your nose or some drainage.  This is from the oxygen used during your procedure.  There is no need for concern and it should clear up in a day or so.  SYMPTOMS TO REPORT IMMEDIATELY:   Following upper endoscopy (EGD)  Vomiting of blood or coffee ground material  New chest pain or pain under the shoulder blades  Painful or persistently difficult swallowing  New shortness of breath  Fever of 100F or higher  Black, tarry-looking stools  For urgent or emergent issues, a gastroenterologist can be reached at any hour by calling [phone].   DIET:  Follow post-dilation diet. Please see post-dilation hand-out given to you by your recovery nurse. Moving forward do not advance beyond soft foods and pureed foods. Drink plenty of fluids but you should avoid alcoholic beverages for 24 hours.  MEDICATIONS:  Resume Plavix (clopidogrel) at prior dose tomorrow. Continue current  medications.  ACTIVITY:  You should plan to take it easy for the rest of today and you should NOT DRIVE or use heavy machinery until tomorrow (because of the sedation medicines used during the test).    FOLLOW UP: Our staff will call the number listed on your records the next business day following your procedure to check on you and address any questions or concerns that you may have regarding the information given to you following your procedure. If we do not reach you, we will leave a message.  However, if you are feeling well and you are not experiencing any problems, there is no need to return our call.  We will assume that you have returned to your regular daily activities without incident.  If any biopsies were taken you will be contacted by phone or by letter within the next 1-3 weeks.  Please call us at [phone] if you have not heard about the biopsies in 3 weeks.  Contact Dr. Henrene Pastor if swallowing is more problematic as we can arrange P dilation as necessary.  Please see handouts given to you by your recovery nurse.  Thank you for allowing [place] to provide for your healthcare needs today.    SIGNATURES/CONFIDENTIALITY: You and/or your care partner have signed paperwork which will be entered into your electronic medical record.  These signatures attest to the fact that that the information above on your After Visit Summary has been reviewed and is understood.  Full responsibility of the confidentiality of  this discharge information lies with you and/or your care-partner.

## 2016-07-18 NOTE — Progress Notes (Signed)
Spontaneous respirations throughout. VSS. Resting comfortably. To PACU on room air. Report to  Sara RN. 

## 2016-07-18 NOTE — Progress Notes (Signed)
Called to room to assist during endoscopic procedure.  Patient ID and intended procedure confirmed with present staff. Received instructions for my participation in the procedure from the performing physician.  

## 2016-07-18 NOTE — Op Note (Signed)
[facility] Patient Name: Ivan Daniels Procedure Date: 07/18/2016 1:34 PM MRN: [id number] Endoscopist: Docia Chuck. Henrene Pastor , MD Age: 71 Referring MD:  Date of Birth: [birth year] Gender: Male Account #: [id number] Procedure:                Upper GI endoscopy, with balloon dilation of the                            esophagus Indications:              Dysphagia. For therapeutic dilation. Last dilation                            06/29/2016. 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 The patient's tolerance of                            previous anesthesia was also reviewed. The risks                            and benefits of the procedure and the sedation                            options and risks were discussed with the patient.                            All questions were answered, and informed consent                            was obtained. Prior Anticoagulants: The patient has                            taken Plavix (clopidogrel), last dose was 7 days                            prior to procedure. ASA Grade Assessment: III - A  patient with severe systemic disease. After                            reviewing the risks and benefits, the patient was                            deemed in satisfactory condition to undergo the                            procedure.                           After obtaining informed consent, the endoscope was                            passed under direct vision. Throughout the   procedure, the patient's blood pressure, pulse, and                            oxygen saturations were monitored continuously. The                            Model GIF-HQ190 [id number]) scope was introduced                            through the mouth, and advanced to the second part                            of duodenum. The upper GI endoscopy was                            accomplished without difficulty. The patient                            tolerated the procedure well. Scope In: Scope Out: Findings:                 Multiple moderate benign-appearing, intrinsic                            stenoses were found. The most proximal stenosis was                            at approximately 19 cm and measured approximately                            12 mm in diameter. There was luminal narrowing and                            evidence of radiation change proximally 5 cm below                            this region. In addition there was a classic peptic  stricture at the gastroesophageal junction                            measuring approximately 15 mm.. A TTS dilator was                            passed through the scope. Dilation with a                            13.5-14.5-15.5 mm balloon dilator was performed to                            15.5 mm. The dilation site was examined following                            endoscope reinsertion and showed moderate                            improvement in luminal narrowing proximal and                            distal. There was minimal trauma in the distal                            portion. There was contained linear laceration                            across the proximal strictured region. Estimated                            blood loss was minimal.                           The entire examined stomach was normal, including                            retroflexed view.                           A single  angiodysplastic lesion was found in the                            duodenal bulb.                           The exam of the duodenum was otherwise normal. Complications:            No immediate complications. Estimated Blood Loss:     Estimated blood loss: none. 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 Dilated as described.                           - Normal stomach.                           - A single angiodysplastic lesion in the duodenum.                           - No specimens collected. Recommendation:           - Patient has a contact number available for                            emergencies. The signs and symptoms of potential                            delayed complications were discussed with the                            patient. Return to normal activities tomorrow.                            Written discharge instructions were provided to the                            patient.                           - Post dilation diet.                           - Moving forward the patient should not advance                            beyond soft foods and pured foods.                           - Continue present medications.                           - Resume Plavix (clopidogrel) at prior dose                            tomorrow.                           - Contact Dr. Henrene Pastor if the patient's swallowing is                            more problematic as we can arrange P dilation as                            necessary                           [facility] ([facility]) and report  provided Docia Chuck. Henrene Pastor, MD 07/18/2016 2:02:45 PM This report has been signed electronically.

## 2016-07-19 ENCOUNTER — Telehealth: Payer: Self-pay | Admitting: *Deleted

## 2016-07-19 NOTE — Telephone Encounter (Signed)
  Follow up Call-  Call back number 07/18/2016 06/29/2016 06/26/2016  Post procedure Call Back phone  # [phone] #[phone]  Permission to leave phone message Yes Yes -  Some recent data might be hidden     Patient questions:  Do you have a fever, pain , or abdominal swelling? No. Pain Score  0 *  Have you tolerated food without any problems? Yes.    Have you been able to return to your normal activities? Yes.    Do you have any questions about your discharge instructions: Diet   No. Medications  No. Follow up visit  No.  Do you have questions or concerns about your Care? No.  Actions: * If pain score is 4 or above: No action needed, pain <4.

## 2016-07-19 NOTE — Telephone Encounter (Signed)
Message left

## 2016-07-24 ENCOUNTER — Telehealth: Payer: Self-pay | Admitting: Pulmonary Disease

## 2016-07-24 MED ORDER — UMECLIDINIUM-VILANTEROL 62.5-25 MCG/INH IN AEPB: 1.0000 | INHALATION_SPRAY | Freq: Every day | RESPIRATORY_TRACT | 0 refills | Status: DC

## 2016-07-24 NOTE — Telephone Encounter (Signed)
Went up front and demonstrated to the pt how to properly use his Anoro inhaler again for the pt. Gave the pt another sample of his Anoro since he was almost out. Pt understood and had no further questions. Nothing further is needed at this time.

## 2016-07-26 ENCOUNTER — Ambulatory Visit: Payer: Medicare Other | Admitting: Podiatry

## 2016-08-09 ENCOUNTER — Encounter: Payer: Self-pay | Admitting: Podiatry

## 2016-08-09 ENCOUNTER — Ambulatory Visit (INDEPENDENT_AMBULATORY_CARE_PROVIDER_SITE_OTHER): Payer: Medicare Other | Admitting: Podiatry

## 2016-08-09 DIAGNOSIS — Q828 Other specified congenital malformations of skin: Secondary | ICD-10-CM

## 2016-08-09 DIAGNOSIS — I739 Peripheral vascular disease, unspecified: Secondary | ICD-10-CM

## 2016-08-09 DIAGNOSIS — S98139S Complete traumatic amputation of one unspecified lesser toe, sequela: Secondary | ICD-10-CM

## 2016-08-09 NOTE — Progress Notes (Signed)
Subjective:     Patient ID: Ivan Daniels, male   DOB: August 11, 1945, 71 y.o.   MRN: [id number]  HPIThis patient presents to my office with painful areas on the bottom of both feet.  He says he lost his toes to frostbite and now develops callused areas on the bottom of both feet.   Review of Systems     Objective:   Physical Exam Objective: Review of past medical history, medications, social history and allergies were performed.  Vascular: Dorsalis pedis and posterior tibial pulses were palpable B/L, capillary refill was  WNL B/L, temperature gradient was WNL B/L   Skin:  No signs of symptoms of infection or ulcers on both feet.  Porokeratosis sub2 right foot and sub ,4 th left foot.   Sensory: Thornell Mule monifilament WNL   Orthopedic: Orthopedic evaluation demonstrates all joints distal t ankle have full ROM without crepitus, muscle power WNL B/L    Assessment:     Porokeratosis B/L     Plan:    Debridement of porokeratosis RTC 10 weeks   Gardiner Barefoot DPM

## 2016-08-14 DIAGNOSIS — E782 Mixed hyperlipidemia: Secondary | ICD-10-CM | POA: Diagnosis not present

## 2016-08-14 DIAGNOSIS — R5383 Other fatigue: Secondary | ICD-10-CM | POA: Diagnosis not present

## 2016-08-14 DIAGNOSIS — R627 Adult failure to thrive: Secondary | ICD-10-CM | POA: Diagnosis not present

## 2016-08-14 DIAGNOSIS — K208 Other esophagitis: Secondary | ICD-10-CM | POA: Diagnosis not present

## 2016-08-14 DIAGNOSIS — Z79899 Other long term (current) drug therapy: Secondary | ICD-10-CM | POA: Diagnosis not present

## 2016-08-17 DIAGNOSIS — F209 Schizophrenia, unspecified: Secondary | ICD-10-CM | POA: Diagnosis not present

## 2016-09-18 ENCOUNTER — Encounter: Payer: Self-pay | Admitting: Pulmonary Disease

## 2016-09-18 ENCOUNTER — Ambulatory Visit (INDEPENDENT_AMBULATORY_CARE_PROVIDER_SITE_OTHER): Payer: Medicare Other | Admitting: Pulmonary Disease

## 2016-09-18 VITALS — BP 120/70 | HR 99 | Ht 71.0 in | Wt 146.0 lb

## 2016-09-18 DIAGNOSIS — C3490 Malignant neoplasm of unspecified part of unspecified bronchus or lung: Secondary | ICD-10-CM | POA: Diagnosis not present

## 2016-09-18 DIAGNOSIS — K222 Esophageal obstruction: Secondary | ICD-10-CM | POA: Diagnosis not present

## 2016-09-18 DIAGNOSIS — J449 Chronic obstructive pulmonary disease, unspecified: Secondary | ICD-10-CM | POA: Diagnosis not present

## 2016-09-18 MED ORDER — UMECLIDINIUM-VILANTEROL 62.5-25 MCG/INH IN AEPB: 1.0000 | INHALATION_SPRAY | Freq: Every day | RESPIRATORY_TRACT | 11 refills | Status: DC

## 2016-09-18 NOTE — Patient Instructions (Signed)
   Continue using your Anoro inhaler as prescribed. We are sending in a prescription & refills today.   Call me if you have any new breathing problems or questions before your next appointment.

## 2016-09-18 NOTE — Progress Notes (Signed)
Subjective:    Patient ID: Ivan Daniels, male    DOB: 1945/07/15, 71 y.o.   MRN: [id number]  C.C.:  Follow-up for Moderate-Severe COPD w/ Emphysema, NSCLC Stage IIIA, & Esophageal Stricture.  HPI Moderate-severe COPD with emphysema: Patient given sample of Anoro to try last appointment. He does sometime have trouble using the device. He does feel the inhaler seems to have helped. He continues to have an infrequent cough. No wheezing.   Stage IIIa NSCLC: Squamous cell carcinoma. Treated in 2016 with concurrent chemotherapy and radiation therapy. Following with Dr. Julien Nordmann. His next appointment is on June 6.   Esophageal stricture: Evaluated by GI in February. Reports he underwent EGD and dilation. He underwent EGD on 2/22 & 3/13. Denies any dysphagia now.   Review of Systems No chest pain or tightness. No fever or chills. No abdominal pain or nausea.   Allergies  Allergen Reactions  . Benztropine Mesylate     REACTION: decrease ROM neck. Pt states he is not allergic   . Chlorpromazine Hcl     Unknown/ per pt causes him to draw up    Current Outpatient Prescriptions on File Prior to Visit  Medication Sig Dispense Refill  . benztropine (COGENTIN) 0.5 MG tablet Take 0.5 mg by mouth at bedtime.     . chlorhexidine (PERIDEX) 0.12 % solution 1 mL by Mouth Rinse route as needed.   1  . clopidogrel (PLAVIX) 75 MG tablet Take 75 mg by mouth daily.     . ferrous fumarate (HEMOCYTE - 106 MG FE) 325 (106 FE) MG TABS tablet Take 1 tablet by mouth daily.     . haloperidol (HALDOL) 5 MG tablet Take 5 mg by mouth at bedtime.     . Multiple Vitamins-Minerals (MULTIVITAMIN WITH MINERALS) tablet Take 1 tablet by mouth daily.    . simvastatin (ZOCOR) 20 MG tablet Take 20 mg by mouth at bedtime.     Marland Kitchen umeclidinium-vilanterol (ANORO ELLIPTA) 62.5-25 MCG/INH AEPB Inhale 1 puff into the lungs daily. 2 each 0   Current Facility-Administered Medications on File Prior to Visit  Medication Dose Route  Frequency Provider Last Rate Last Dose  . 0.9 %  sodium chloride infusion  500 mL Intravenous Continuous Irene Shipper, MD      . 0.9 %  sodium chloride infusion  500 mL Intravenous Continuous Irene Shipper, MD      . 0.9 %  sodium chloride infusion  500 mL Intravenous Continuous Irene Shipper, MD        Past Medical History:  Diagnosis Date  . ALCOHOL ABUSE 11/12/2009   Qualifier: Diagnosis of  By: Hassell Done FNP, Tori Milks    . Allergy   . Anemia   . Anxiety   . BENIGN PROSTATIC HYPERTROPHY, HX OF 12/19/2006   Qualifier: Diagnosis of  By: Radene Ou MD, [place]    . Blood transfusion without reported diagnosis 2017  . Clotting disorder ([place])    takes PLAVIX  . Depression   . Emphysema of lung ([place])   . Full code status 02/10/2015  . GERD (gastroesophageal reflux disease)   . Hyperlipidemia   . Lung mass 01/25/2015   3.7 x 3.6 cm RUL spiculated lung mass with 2.0 cm right paratracheal lymph node suspicious for bronchogenic CA  . Non-small cell lung cancer ([facility]) 01/27/15   non small-cell,squamous cell ca RUL  . Paranoid schizophrenia ([place]) 10/31/2009   Qualifier: Diagnosis of  By: Jorene Minors, Scott    .  Primary spontaneous pneumothorax, left 01/25/2015  . TOBACCO ABUSE 05/24/2009   Qualifier: Diagnosis of  By: Hassell Done FNP, Tori Milks      Past Surgical History:  Procedure Laterality Date  . APPENDECTOMY    . CHEST TUBE INSERTION Left   . COLONOSCOPY    . INSERTION CENTRAL VENOUS ACCESS DEVICE W/ SUBCUTANEOUS PORT Right   . LUNG BIOPSY Right 01/27/2015   Procedure: Right Upper Lobe Bronchus BIOPSY;  Surgeon: Grace Isaac, MD;  Location: [place];  Service: Thoracic;  Laterality: Right;  Marland Kitchen VIDEO BRONCHOSCOPY WITH ENDOBRONCHIAL ULTRASOUND N/A 01/27/2015   Procedure: VIDEO BRONCHOSCOPY WITH ENDOBRONCHIAL ULTRASOUND;  Surgeon: Grace Isaac, MD;  Location[facility];  Service: Thoracic;  Laterality: N/A;    Family History  Problem Relation Age of Onset  . Breast cancer Sister   . Diabetes  Sister   . Hyperlipidemia Sister   . Lung disease Neg Hx   . Colon cancer Neg Hx   . Esophageal cancer Neg Hx   . Stomach cancer Neg Hx   . Pancreatic cancer Neg Hx   . Prostate cancer Neg Hx   . Rectal cancer Neg Hx     Social History   Social History  . Marital status: Single    Spouse name: N/A  . Number of children: N/A  . Years of education: N/A   Social History Main Topics  . Smoking status: Former Smoker    Packs/day: 0.25    Years: 52.00    Types: Cigarettes    Quit date: 01/07/2016  . Smokeless tobacco: Never Used     Comment: Peak rate of 4-5 cigarettes per day  . Alcohol use No  . Drug use: No  . Sexual activity: No   Other Topics Concern  . None   Social History Narrative   [facility] (05/12/16):   Originally from [facility] Has always lived in [place]. 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 No known asbestos exposure. Significant dust and cotton dust exposure. No pets currently. No bird or mold exposure.       Objective:   Physical Exam BP 120/70 (BP Location: Right Arm, Patient Position: Sitting, Cuff Size: Normal)   Pulse 99   Ht '5\' 11"'$  (1.803 m)   Wt 146 lb (66.2 kg)   SpO2 100%   BMI 20.36 kg/m   Gen.: Thin, elderly male. No distress. Wife with patient today. Integument: No rash. No bruising. Warm. HEENT: No nasal turbinate swelling. No oral ulcers. Moist mucous membranes. Pulmonary: Good aeration bilaterally. Clear with auscultation. Normal work of breathing. Cardiac: Regular rate. Regular rhythm. No edema.  PFT 06/13/16: FVC 2.74 L (68%) FEV1 1.75 (57%) FEV1/FVC is 0.64 FEF 25-75 1.00) 3%) negative bronchodilator response TLC 4.44 L (61%) RV 76% ERV 122% DLCO corrected 38%  6MWT 06/15/16:  Walked 222 meters / Baseline Sat 98% on RA / Nadir Sat 97% on RA 2 minutes after walk  IMAGING ESOPHAGRAM/BARIUM SWALLOW 05/19/16 (per radiologist):  Smoothly tapered stricture of the proximal esophagus at the level of the thoracic inlet  preventing passage of barium tablet. Moderate esophageal dysmotility.  CT CHEST W/ CONTRAST 02/28/16 (previously reviewed by me): Atypical predominant emphysema & subpleural bleb formation. Right hilar consolidation suggesting fibrotic change with continued stability. Hyperexpansion of left lung volume loss on the right. No new nodule or opacity appreciated. No pathologic mediastinal adenopathy. No pleural effusion or thickening. No pericardial effusion.  PATHOLOGY Right Upper Lobe Bronchial Brush (01/27/15):  Squamous Cell  Carcinoma Right Upper Lobe Endobronchial Biopsy (01/27/15):  Squamous Cell Carcinoma EBUS Level 7 FNA (01/27/15):  Squamous Cell Carcinoma   LABS 05/12/16 Alpha-1 antitrypsin: MM (151)    Assessment & Plan:  71 y.o. male with moderate-severe COPD with emphysema, stage IIIa lung cancer, & esophageal stricture. Patient's dysphagia has significantly improved post esophageal dilation. He seems to be tolerating his Anoro inhaler well with good symptomatic benefit. As such, I do not feel switching to an alternative inhaler would be advisable. I instructed the patient and his wife to contact me if he had any new breathing problems or questions before next appointment.  1. Moderate-severe COPD with emphysema: Continuing patient on Anoro inhaler. No changes. 2. Stage IIIa lung cancer: Non-small cell lung cancer (squamous cell carcinoma). Following with medical oncology. 3. Esophageal stricture: 4. Health maintenance: Status post Prevnar Vaccine January 2018, Influenza Vaccine October 2017 & Pneumovax 29 December 2002.  5. Follow-up: Return to clinic in 6 months or sooner if needed.  Sonia Baller Ashok Cordia, M.D. [facility] Pager:  [phone] After 3pm or if no response, call [phone] 2:20 PM 09/18/16

## 2016-09-25 ENCOUNTER — Telehealth: Payer: Self-pay | Admitting: Pulmonary Disease

## 2016-09-25 MED ORDER — UMECLIDINIUM-VILANTEROL 62.5-25 MCG/INH IN AEPB
1.0000 | INHALATION_SPRAY | Freq: Every day | RESPIRATORY_TRACT | 11 refills | Status: DC
Start: 2016-09-25 — End: 2017-03-20

## 2016-09-25 NOTE — Telephone Encounter (Signed)
Spoke to pt's sister Earlie Server (dpr on file), states that [employer] was not sent to pharmacy at last ov.  This has been resent.  Nothing further needed.

## 2016-10-04 ENCOUNTER — Encounter (HOSPITAL_COMMUNITY): Payer: Self-pay

## 2016-10-04 ENCOUNTER — Other Ambulatory Visit (HOSPITAL_BASED_OUTPATIENT_CLINIC_OR_DEPARTMENT_OTHER): Payer: Medicare Other

## 2016-10-04 ENCOUNTER — Ambulatory Visit (HOSPITAL_COMMUNITY)
Admission: RE | Admit: 2016-10-04 | Discharge: 2016-10-04 | Disposition: A | Discharge status: Home / Self Care | Payer: Medicare Other | Source: Ambulatory Visit | Attending: Internal Medicine | Admitting: Internal Medicine

## 2016-10-04 DIAGNOSIS — Z85038 Personal history of other malignant neoplasm of large intestine: Secondary | ICD-10-CM

## 2016-10-04 DIAGNOSIS — C3411 Malignant neoplasm of upper lobe, right bronchus or lung: Secondary | ICD-10-CM

## 2016-10-04 DIAGNOSIS — Z9889 Other specified postprocedural states: Secondary | ICD-10-CM | POA: Diagnosis not present

## 2016-10-04 DIAGNOSIS — I2510 Atherosclerotic heart disease of native coronary artery without angina pectoris: Secondary | ICD-10-CM | POA: Diagnosis not present

## 2016-10-04 DIAGNOSIS — J479 Bronchiectasis, uncomplicated: Secondary | ICD-10-CM | POA: Diagnosis not present

## 2016-10-04 DIAGNOSIS — C3490 Malignant neoplasm of unspecified part of unspecified bronchus or lung: Secondary | ICD-10-CM | POA: Diagnosis not present

## 2016-10-04 LAB — CBC WITH DIFFERENTIAL/PLATELET
BASO%: 0.5 % (ref 0.0–2.0)
Basophils Absolute: 0 10*3/uL (ref 0.0–0.1)
EOS%: 1.5 % (ref 0.0–7.0)
Eosinophils Absolute: 0.1 10*3/uL (ref 0.0–0.5)
HCT: 37.9 % — ABNORMAL LOW (ref 38.4–49.9)
HGB: 12.5 g/dL — ABNORMAL LOW (ref 13.0–17.1)
LYMPH%: 23.2 % (ref 14.0–49.0)
MCH: 30.2 pg (ref 27.2–33.4)
MCHC: 33.1 g/dL (ref 32.0–36.0)
MCV: 91.2 fL (ref 79.3–98.0)
MONO#: 0.4 10*3/uL (ref 0.1–0.9)
MONO%: 10.8 % (ref 0.0–14.0)
NEUT#: 2.6 10*3/uL (ref 1.5–6.5)
NEUT%: 64 % (ref 39.0–75.0)
Platelets: 200 10*3/uL (ref 140–400)
RBC: 4.16 10*6/uL — ABNORMAL LOW (ref 4.20–5.82)
RDW: 16.7 % — ABNORMAL HIGH (ref 11.0–14.6)
WBC: 4.1 10*3/uL (ref 4.0–10.3)
lymph#: 1 10*3/uL (ref 0.9–3.3)

## 2016-10-04 LAB — COMPREHENSIVE METABOLIC PANEL
ALT: 10 U/L (ref 0–55)
AST: 18 U/L (ref 5–34)
Albumin: 3.7 g/dL (ref 3.5–5.0)
Alkaline Phosphatase: 48 U/L (ref 40–150)
Anion Gap: 11 mEq/L (ref 3–11)
BUN: 8.3 mg/dL (ref 7.0–26.0)
CO2: 24 mEq/L (ref 22–29)
Calcium: 9.3 mg/dL (ref 8.4–10.4)
Chloride: 106 mEq/L (ref 98–109)
Creatinine: 1.1 mg/dL (ref 0.7–1.3)
EGFR: 83 mL/min/{1.73_m2} — ABNORMAL LOW (ref >90)
Glucose: 95 mg/dl (ref 70–140)
Potassium: 4.2 mEq/L (ref 3.5–5.1)
Sodium: 141 mEq/L (ref 136–145)
Total Bilirubin: 0.25 mg/dL (ref 0.20–1.20)
Total Protein: 7.2 g/dL (ref 6.4–8.3)

## 2016-10-04 MED ORDER — IOPAMIDOL (ISOVUE-300) INJECTION 61%
INTRAVENOUS | Status: AC
  Filled 2016-10-04: qty 75

## 2016-10-04 MED ORDER — IOPAMIDOL (ISOVUE-300) INJECTION 61%
75.0000 mL | Freq: Once | INTRAVENOUS | Status: AC | PRN
  Administered 2016-10-04: 75 mL via INTRAVENOUS

## 2016-10-11 ENCOUNTER — Encounter: Payer: Self-pay | Admitting: Internal Medicine

## 2016-10-11 ENCOUNTER — Ambulatory Visit (HOSPITAL_BASED_OUTPATIENT_CLINIC_OR_DEPARTMENT_OTHER): Payer: Medicare Other | Admitting: Internal Medicine

## 2016-10-11 ENCOUNTER — Telehealth: Payer: Self-pay | Admitting: Internal Medicine

## 2016-10-11 VITALS — BP 114/79 | HR 98 | Temp 98.2°F | Resp 18 | Ht 71.0 in | Wt 145.0 lb

## 2016-10-11 DIAGNOSIS — Z85038 Personal history of other malignant neoplasm of large intestine: Secondary | ICD-10-CM | POA: Diagnosis not present

## 2016-10-11 DIAGNOSIS — C3411 Malignant neoplasm of upper lobe, right bronchus or lung: Secondary | ICD-10-CM

## 2016-10-11 NOTE — Telephone Encounter (Signed)
Scheduled appt per 6/6 los - Gave patient AVS and calender per LOS - [facility] to contact patient with Ct appt.

## 2016-10-11 NOTE — Progress Notes (Signed)
[facility] [phone]   [phone]  OFFICE PROGRESS NOTE  [name], FNP [address] [place] [address]  DIAGNOSIS: Stage IIIA (T2a, N2, M0) non-small cell lung cancer consistent with invasive squamous cell carcinoma presented with right upper lobe lung mass in addition to right hilar and mediastinal lymphadenopathy diagnosed in September 2016.  PRIOR THERAPY:  1) Course of concurrent chemoradiation with weekly carboplatin for AUC of 2 and paclitaxel 45 MG/M2. First dose 03/01/2015. He is status post 6 cycles. Last cycle was given 04/12/2015 with partial response. 2) Consolidation chemotherapy with reduced dose carboplatin for AUC of 5 and paclitaxel 175 MG/M2 every 3 weeks, status post 3 cycles.   CURRENT THERAPY: Observation.  INTERVAL HISTORY: Ivan Daniels 71 y.o. male returns to the clinic today for follow-up visit accompanied by his sister. The patient is feeling fine today with no specific complaints. He denied having any weight loss or night sweats. He has no nausea, vomiting, diarrhea or constipation. He has no chest pain, shortness breath but continues to have mild cough with no hemoptysis. He has no fever or chills. He had repeat CT scan of the chest performed recently and he is here for evaluation and discussion of his scan results.   MEDICAL HISTORY: Past Medical History:  Diagnosis Date  . ALCOHOL ABUSE 11/12/2009   Qualifier: Diagnosis of  By: Hassell Done FNP, Tori Milks    . Allergy   . Anemia   . Anxiety   . BENIGN PROSTATIC HYPERTROPHY, HX OF 12/19/2006   Qualifier: Diagnosis of  By: Radene Ou MD, [place]    . Blood transfusion without reported diagnosis 2017  . Clotting disorder ([place])    takes PLAVIX  . Depression   . Emphysema of lung ([place])   . Full code status 02/10/2015  . GERD (gastroesophageal reflux disease)   . Hyperlipidemia   . Lung mass 01/25/2015   3.7 x 3.6 cm RUL spiculated lung mass with 2.0 cm  right paratracheal lymph node suspicious for bronchogenic CA  . Non-small cell lung cancer ([place]) 01/27/15   non small-cell,squamous cell ca RUL  . Paranoid schizophrenia ([place]) 10/31/2009   Qualifier: Diagnosis of  By: Jorene Minors, Scott    . Primary spontaneous pneumothorax, left 01/25/2015  . TOBACCO ABUSE 05/24/2009   Qualifier: Diagnosis of  By: Hassell Done FNP, [place]      ALLERGIES:  is allergic to benztropine mesylate and chlorpromazine hcl.  MEDICATIONS:  Current Outpatient Prescriptions  Medication Sig Dispense Refill  . benztropine (COGENTIN) 0.5 MG tablet Take 0.5 mg by mouth at bedtime.     . chlorhexidine (PERIDEX) 0.12 % solution 1 mL by Mouth Rinse route as needed.   1  . clopidogrel (PLAVIX) 75 MG tablet Take 75 mg by mouth daily.     . ferrous fumarate (HEMOCYTE - 106 MG FE) 325 (106 FE) MG TABS tablet Take 1 tablet by mouth daily.     . haloperidol (HALDOL) 5 MG tablet Take 5 mg by mouth at bedtime.     . Multiple Vitamins-Minerals (MULTIVITAMIN WITH MINERALS) tablet Take 1 tablet by mouth daily.    . simvastatin (ZOCOR) 20 MG tablet Take 20 mg by mouth at bedtime.     Marland Kitchen umeclidinium-vilanterol (ANORO ELLIPTA) 62.5-25 MCG/INH AEPB Inhale 1 puff into the lungs daily. 1 each 11   Current Facility-Administered Medications  Medication Dose Route Frequency Provider Last Rate Last Dose  . 0.9 %  sodium chloride  infusion  500 mL Intravenous Continuous Irene Shipper, MD      . 0.9 %  sodium chloride infusion  500 mL Intravenous Continuous Irene Shipper, MD      . 0.9 %  sodium chloride infusion  500 mL Intravenous Continuous Irene Shipper, MD        SURGICAL HISTORY:  Past Surgical History:  Procedure Laterality Date  . APPENDECTOMY    . CHEST TUBE INSERTION Left   . COLONOSCOPY    . INSERTION CENTRAL VENOUS ACCESS DEVICE W/ SUBCUTANEOUS PORT Right   . LUNG BIOPSY Right 01/27/2015   Procedure: Right Upper Lobe Bronchus BIOPSY;  Surgeon: Grace Isaac, MD;  Location: [place];   Service: Thoracic;  Laterality: Right;  Marland Kitchen VIDEO BRONCHOSCOPY WITH ENDOBRONCHIAL ULTRASOUND N/A 01/27/2015   Procedure: VIDEO BRONCHOSCOPY WITH ENDOBRONCHIAL ULTRASOUND;  Surgeon: Grace Isaac, MD;  Location: [facility] OR;  Service: Thoracic;  Laterality: N/A;    REVIEW OF SYSTEMS:  A comprehensive review of systems was negative.   PHYSICAL EXAMINATION: General appearance: alert, cooperative and no distress Head: Normocephalic, without obvious abnormality, atraumatic Neck: no adenopathy, no JVD, supple, symmetrical, trachea midline and thyroid not enlarged, symmetric, no tenderness/mass/nodules Lymph nodes: Cervical, supraclavicular, and axillary nodes normal. Resp: clear to auscultation bilaterally Back: symmetric, no curvature. ROM normal. No CVA tenderness. Cardio: regular rate and rhythm, S1, S2 normal, no murmur, click, rub or gallop GI: soft, non-tender; bowel sounds normal; no masses,  no organomegaly Extremities: extremities normal, atraumatic, no cyanosis or edema  ECOG PERFORMANCE STATUS: 1 - Symptomatic but completely ambulatory  Blood pressure 114/79, pulse 98, temperature 98.2 F (36.8 C), temperature source Oral, resp. rate 18, height 5\' 11"  (1.803 m), weight 145 lb (65.8 kg), SpO2 100 %.  LABORATORY DATA: Lab Results  Component Value Date   WBC 4.1 10/04/2016   HGB 12.5 (L) 10/04/2016   HCT 37.9 (L) 10/04/2016   MCV 91.2 10/04/2016   PLT 200 10/04/2016      Chemistry      Component Value Date/Time   NA 141 10/04/2016 0751   K 4.2 10/04/2016 0751   CL 101 01/26/2015 1454   CO2 24 10/04/2016 0751   BUN 8.3 10/04/2016 0751   CREATININE 1.1 10/04/2016 0751      Component Value Date/Time   CALCIUM 9.3 10/04/2016 0751   ALKPHOS 48 10/04/2016 0751   AST 18 10/04/2016 0751   ALT 10 10/04/2016 0751   BILITOT 0.25 10/04/2016 0751       RADIOGRAPHIC STUDIES: Ct Chest W Contrast  Result Date: 10/04/2016 CLINICAL DATA:  Lung cancer, chemotherapy and radiation  therapy complete. EXAM: CT CHEST WITH CONTRAST TECHNIQUE: Multidetector CT imaging of the chest was performed during intravenous contrast administration. CONTRAST:  72mL ISOVUE-300 IOPAMIDOL (ISOVUE-300) INJECTION 61% COMPARISON:  06/05/2016. FINDINGS: Cardiovascular: Right IJ Port-A-Cath terminates in the high right atrium. Coronary artery calcification. Heart size normal. Small amount of right anterolateral pericardial fluid, similar. Mediastinum/Nodes: Prevascular lymph node measures 11 mm, stable when remeasured. No hilar or axillary adenopathy. Lung base is unremarkable. Lungs/Pleura: Bullous changes in the apices. Consolidation, volume loss, bronchiectasis and architectural distortion in the paramediastinal upper right hemithorax. Lungs are otherwise clear. No pleural fluid. Airway is otherwise unremarkable. Upper Abdomen: Faint blush of contrast in the dome of the liver may represent a small perfusion anomaly. Visualized portions of the liver, gallbladder and adrenal glands are otherwise unremarkable. Right kidney is non rotated. Visualized portions of the kidneys, spleen, pancreas,  stomach and bowel are grossly unremarkable. There are calcified periportal lymph nodes. Musculoskeletal: No worrisome lytic or sclerotic lesions. IMPRESSION: 1. Postradiation changes in the upper right hemithorax without evidence of recurrent or metastatic disease. 2. Coronary artery calcification. Electronically Signed   By: Lorin Picket M.D.   On: 10/04/2016 12:13    ASSESSMENT AND PLAN:  This is a very pleasant 71 years old African-American male with a stage IIIa non-small cell lung cancer, squamous cell carcinoma status post concurrent chemoradiation followed by consolidation chemotherapy. The patient is currently on observation and he is doing fine. His recent CT scan of the chest showed no evidence for disease recurrence or metastatic. I discussed the scan results with the patient today. I recommended for him to  continue on observation with repeat CT scan of the chest in 4 months. He was advised to call immediately if he has any concerning symptoms in the interval. The patient voices understanding of current disease status and treatment options and is in agreement with the current care plan. All questions were answered. The patient knows to call the clinic with any problems, questions or concerns. We can certainly see the patient much sooner if necessary. I spent 10 minutes counseling the patient face to face. The total time spent in the appointment was 15 minutes. Disclaimer: This note was dictated with voice recognition software. Similar sounding words can inadvertently be transcribed and may not be corrected upon review.

## 2016-10-17 DIAGNOSIS — F209 Schizophrenia, unspecified: Secondary | ICD-10-CM | POA: Diagnosis not present

## 2016-10-18 ENCOUNTER — Ambulatory Visit (INDEPENDENT_AMBULATORY_CARE_PROVIDER_SITE_OTHER): Payer: Medicare Other | Admitting: Podiatry

## 2016-10-18 ENCOUNTER — Encounter: Payer: Self-pay | Admitting: Podiatry

## 2016-10-18 DIAGNOSIS — Q828 Other specified congenital malformations of skin: Secondary | ICD-10-CM | POA: Diagnosis not present

## 2016-10-18 DIAGNOSIS — I739 Peripheral vascular disease, unspecified: Secondary | ICD-10-CM

## 2016-10-18 DIAGNOSIS — S98139S Complete traumatic amputation of one unspecified lesser toe, sequela: Secondary | ICD-10-CM

## 2016-10-18 NOTE — Progress Notes (Signed)
Subjective:     Patient ID: Ivan Daniels, male   DOB: 03-19-1946, 71 y.o.   MRN: [id number]  HPIThis patient presents to my office with painful areas on the bottom of both feet.  He says he lost his toes to frostbite and now develops callused areas on the bottom of both feet.   Review of Systems     Objective:   Physical Exam Objective: Review of past medical history, medications, social history and allergies were performed.  Vascular: Dorsalis pedis and posterior tibial pulses were palpable B/L, capillary refill was  WNL B/L, temperature gradient was WNL B/L   Skin:  No signs of symptoms of infection or ulcers on both feet.  Porokeratosis  At the level 1,2  Distal aspect toes  B/l  Sensory: Ivan Daniels monifilament WNL   Orthopedic: Orthopedic evaluation demonstrates all joints distal t ankle have full ROM without crepitus, muscle power WNL B/L    Assessment:     Porokeratosis B/L     Plan:    Debridement of porokeratosis RTC 10 weeks .  Talked with Ivan Daniels and he wants me to make an appointment with him at a later date.  Told to use pumice stone between visits.  Gardiner Barefoot DPM

## 2016-10-19 ENCOUNTER — Ambulatory Visit: Payer: Medicare Other | Admitting: Orthotics

## 2016-10-27 DIAGNOSIS — E782 Mixed hyperlipidemia: Secondary | ICD-10-CM | POA: Diagnosis not present

## 2016-10-27 DIAGNOSIS — E860 Dehydration: Secondary | ICD-10-CM | POA: Diagnosis not present

## 2016-10-27 DIAGNOSIS — Z79899 Other long term (current) drug therapy: Secondary | ICD-10-CM | POA: Diagnosis not present

## 2016-10-27 DIAGNOSIS — R5383 Other fatigue: Secondary | ICD-10-CM | POA: Diagnosis not present

## 2016-10-27 DIAGNOSIS — E611 Iron deficiency: Secondary | ICD-10-CM | POA: Diagnosis not present

## 2016-11-09 ENCOUNTER — Ambulatory Visit: Payer: Medicare Other | Admitting: Orthotics

## 2016-11-12 IMAGING — CT CT CHEST W/ CM
2 of 3 series · 15 of 36 positions shown, 18 images · IV contrast (iopamidol)
Comparison: 11/11/2015

CLINICAL DATA: Lung cancer diagnosed January 2015.

EXAM:
CT CHEST WITH CONTRAST
TECHNIQUE: Multidetector CT imaging of the chest was performed during
intravenous contrast administration.
CONTRAST:  75mL FUO2OC-PYY IOPAMIDOL (FUO2OC-PYY) INJECTION 61%

[Series 2: chest with st · axial · 0.71mm/px · z∈[-321,-35]mm · 12 of 169 slices shown, 15 images]
[im 13/169  mediastinal]
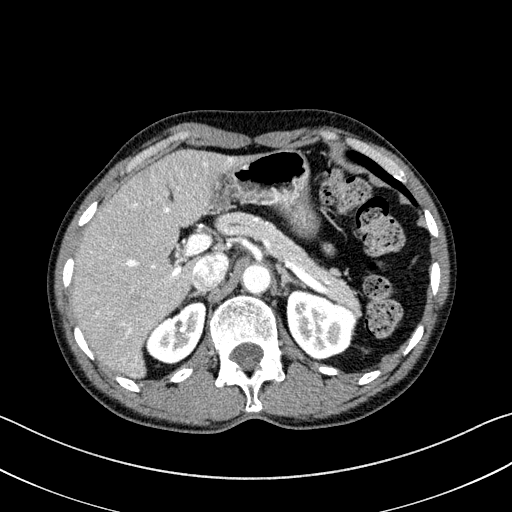
[im 13/169  lung]
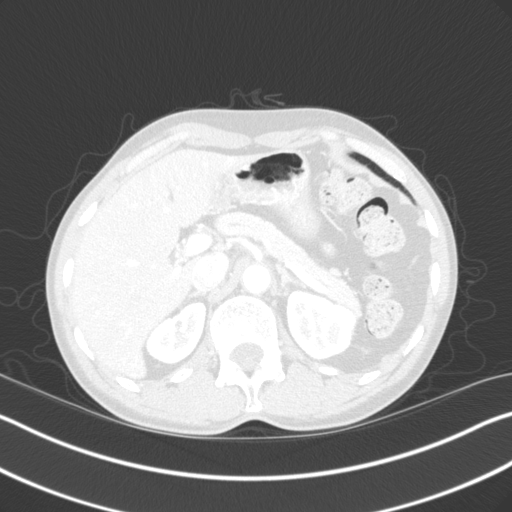
[im 25/169  lung]
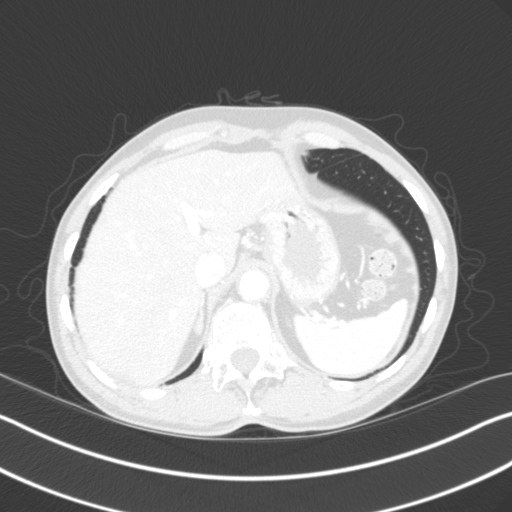
[im 38/169  lung]
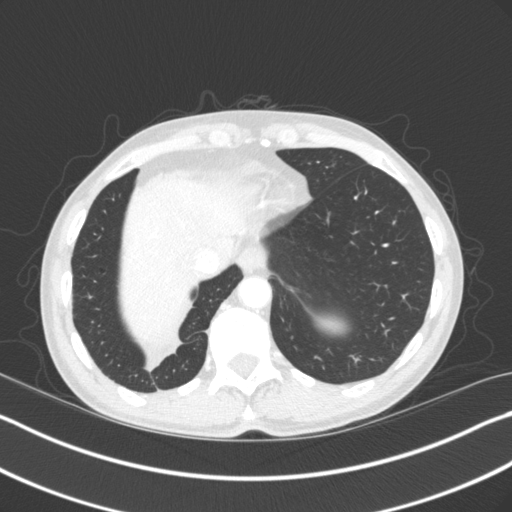
[im 50/169  lung]
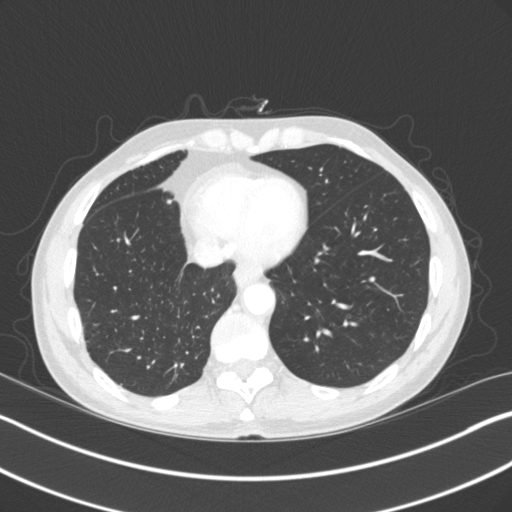
[im 63/169  mediastinal]
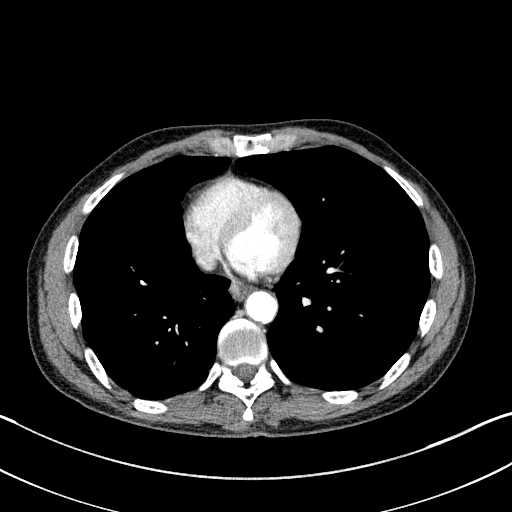
[im 63/169  lung]
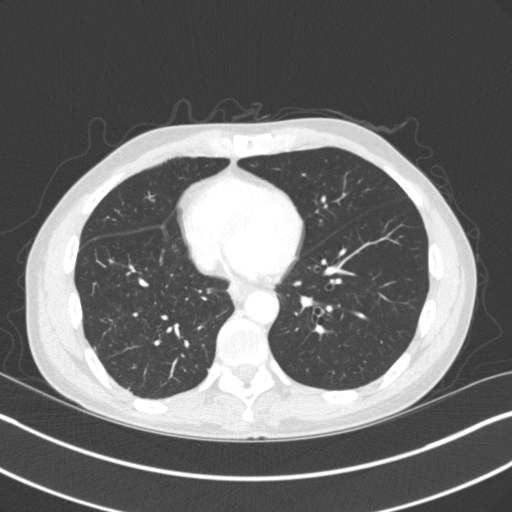
[im 75/169  lung]
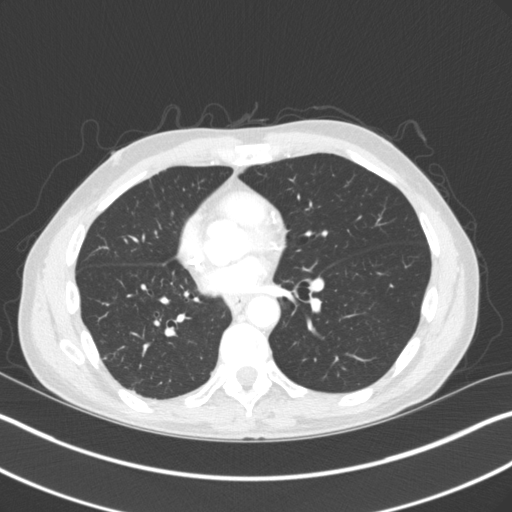
[im 94/169  lung]
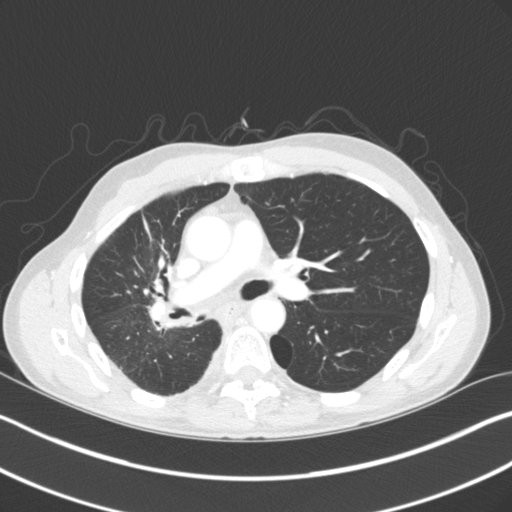
[im 106/169  lung]
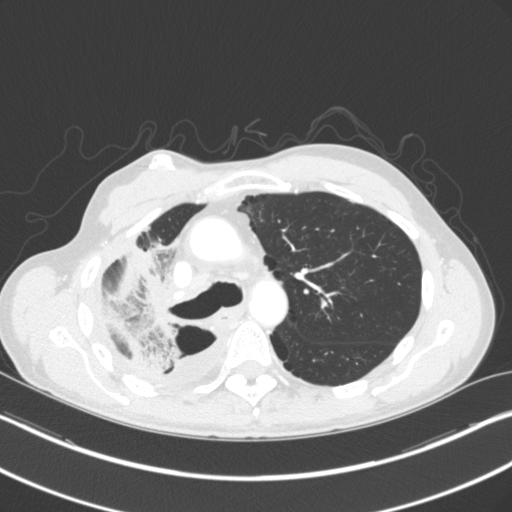
[im 119/169  mediastinal]
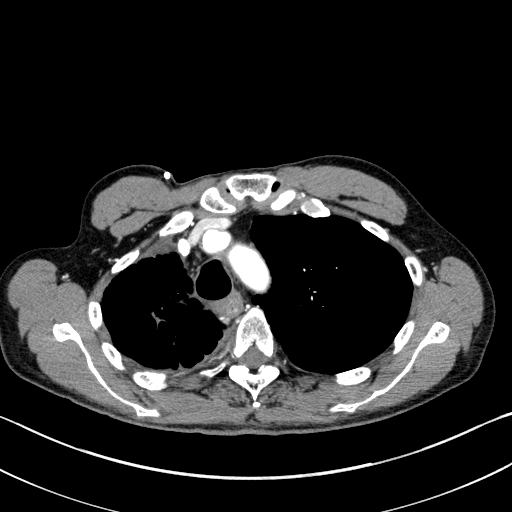
[im 119/169  lung]
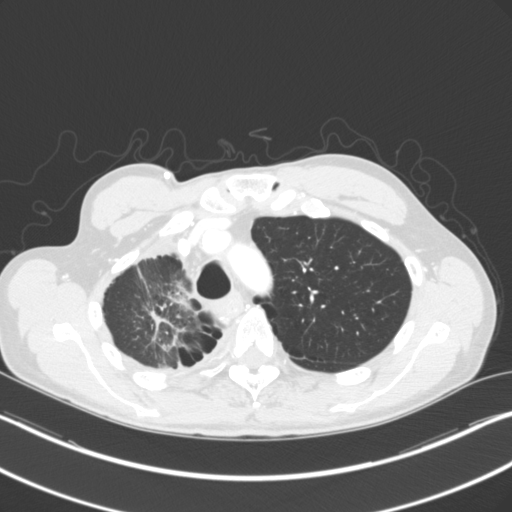
[im 131/169  lung]
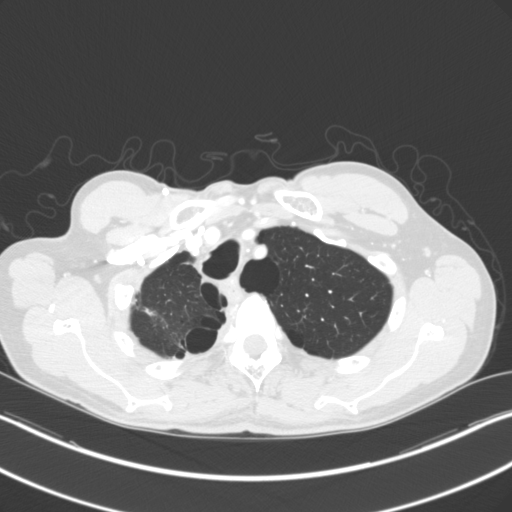
[im 144/169  lung]
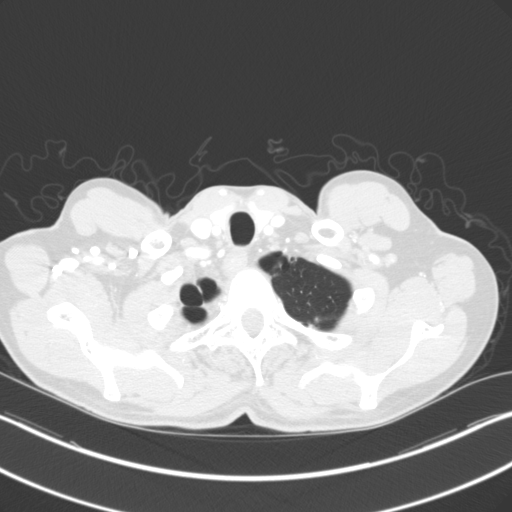
[im 156/169  lung]
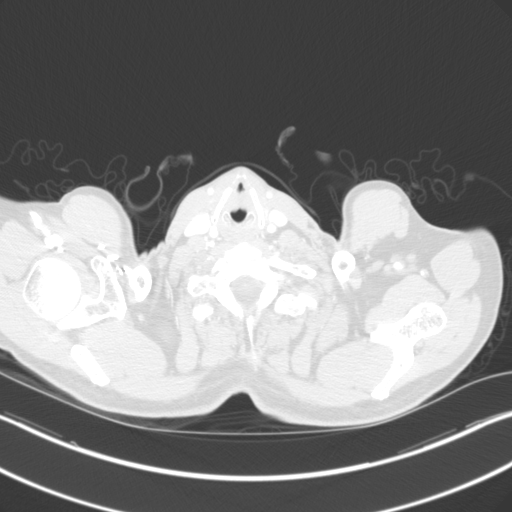

[Series 6: coronals · coronal · 0.66mm/px · 3 of 82 slices shown]
[im 17/82  lung]
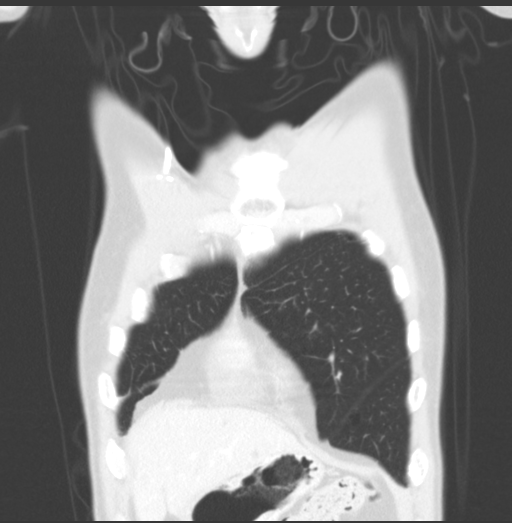
[im 33/82  lung]
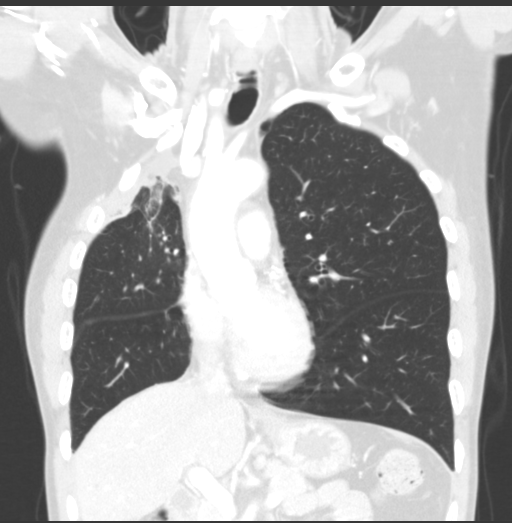
[im 49/82  lung]
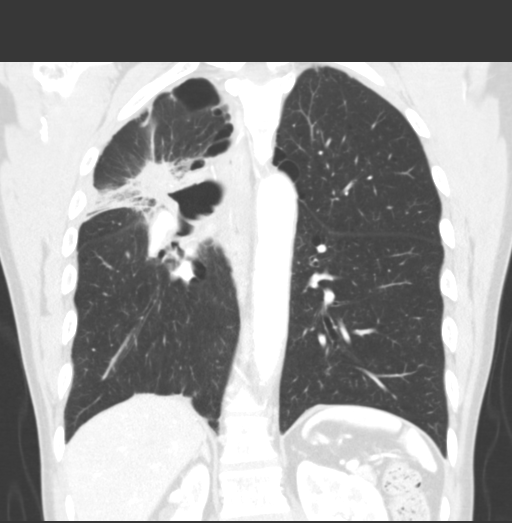

[15 of 36 positions shown; findings below may reference images not displayed]

FINDINGS: Cardiovascular: No significant vascular findings. Normal heart size.
No pericardial effusion.

Mediastinum/Nodes: No axillary supraclavicular adenopathy.
Prevascular lymph node anterior to the aorta measures 8 mm
unchanged. There is bronchial thickening extending to the RIGHT
hilum unchanged.

Lungs/Pleura: There is consolidation and fibrotic change surrounding
RIGHT hilum similar to comparison exam. No new nodularity. LEFT lung
is hyperexpanded. No discrete nodularity.

Upper Abdomen: Adrenal glands are normal. No suspicious liver
lesion.

Musculoskeletal: No aggressive osseous lesion.
IMPRESSION: 1. Stable post therapy change in RIGHT hemi thorax with perihilar
volume loss and fibrotic consolidation.
2. No evidence of lung cancer progression.

## 2016-11-16 DIAGNOSIS — Z79899 Other long term (current) drug therapy: Secondary | ICD-10-CM | POA: Diagnosis not present

## 2016-11-16 DIAGNOSIS — E782 Mixed hyperlipidemia: Secondary | ICD-10-CM | POA: Diagnosis not present

## 2016-11-16 DIAGNOSIS — E611 Iron deficiency: Secondary | ICD-10-CM | POA: Diagnosis not present

## 2016-11-23 ENCOUNTER — Ambulatory Visit: Payer: Medicare Other | Admitting: Orthotics

## 2016-11-23 DIAGNOSIS — S98139S Complete traumatic amputation of one unspecified lesser toe, sequela: Secondary | ICD-10-CM

## 2016-11-23 NOTE — Progress Notes (Signed)
Patient came in today to pickup shoes/toe filler for bilat transmet..order was placed with [employer] and they cannot find either the foam box or order form.  A copy of order form is in office.   I recast the patient and will send the molds to [employer] to produce.  Apologies extended and patient seem to understand.

## 2016-12-14 ENCOUNTER — Ambulatory Visit: Payer: Medicare Other | Admitting: Orthotics

## 2017-01-09 DIAGNOSIS — F209 Schizophrenia, unspecified: Secondary | ICD-10-CM | POA: Diagnosis not present

## 2017-01-22 ENCOUNTER — Encounter: Payer: Medicare Other | Admitting: Orthotics

## 2017-01-23 ENCOUNTER — Ambulatory Visit (INDEPENDENT_AMBULATORY_CARE_PROVIDER_SITE_OTHER): Payer: Medicare Other | Admitting: Orthotics

## 2017-01-23 ENCOUNTER — Ambulatory Visit (INDEPENDENT_AMBULATORY_CARE_PROVIDER_SITE_OTHER): Payer: Medicare Other | Admitting: Podiatry

## 2017-01-23 ENCOUNTER — Encounter: Payer: Self-pay | Admitting: Podiatry

## 2017-01-23 DIAGNOSIS — S98139S Complete traumatic amputation of one unspecified lesser toe, sequela: Secondary | ICD-10-CM

## 2017-01-23 DIAGNOSIS — I739 Peripheral vascular disease, unspecified: Secondary | ICD-10-CM | POA: Diagnosis not present

## 2017-01-23 DIAGNOSIS — Q828 Other specified congenital malformations of skin: Secondary | ICD-10-CM

## 2017-01-23 NOTE — Progress Notes (Signed)
Subjective:     Patient ID: Ivan Daniels, male   DOB: 1945/09/06, 71 y.o.   MRN: [id number]  HPIThis patient presents to my office with painful areas on the bottom of both feet.  He says he lost his toes to frostbite and now develops callused areas on the bottom of both feet.   Review of Systems     Objective:   Physical Exam Objective: Review of past medical history, medications, social history and allergies were performed.  Vascular: Dorsalis pedis and posterior tibial pulses were palpable B/L, capillary refill was  WNL B/L, temperature gradient was WNL B/L   Skin:  No signs of symptoms of infection or ulcers on both feet.  Porokeratosis  At the level 1,2  Distal aspect toes  B/l  Sensory: Thornell Mule monifilament WNL   Orthopedic: Orthopedic evaluation demonstrates all joints distal t ankle have full ROM without crepitus, muscle power WNL B/L    Assessment:     Porokeratosis B/L     Plan:    Debridement of porokeratosis RTC 10 weeks .  Filler and shoes dispensed by [employer].   Gardiner Barefoot DPM

## 2017-01-23 NOTE — Progress Notes (Signed)
Patient came in today to pick up diabetic shoes and custom L5000 Toe fillers.  Same was well pleased with fit and function.   The patient could ambulate without any discomfort; there were no signs of any quality issues. The foot ortheses offered full contact with plantar surface and contoured the arch well.   The shoes fit well with no heel slippage and areas of pressure concern.   Patient advised to contact us if any problems arise.  Patient also advised on how to report any issues.

## 2017-01-30 ENCOUNTER — Ambulatory Visit: Payer: Medicare Other | Admitting: Orthotics

## 2017-02-05 ENCOUNTER — Ambulatory Visit: Payer: Medicare Other | Admitting: Orthotics

## 2017-02-05 DIAGNOSIS — S98139S Complete traumatic amputation of one unspecified lesser toe, sequela: Secondary | ICD-10-CM

## 2017-02-05 DIAGNOSIS — I739 Peripheral vascular disease, unspecified: Secondary | ICD-10-CM

## 2017-02-05 NOTE — Progress Notes (Signed)
Patient came back in to have toefillers trimmed to better fit in shoe.

## 2017-02-08 ENCOUNTER — Other Ambulatory Visit (HOSPITAL_BASED_OUTPATIENT_CLINIC_OR_DEPARTMENT_OTHER): Payer: Medicare Other

## 2017-02-08 ENCOUNTER — Ambulatory Visit (HOSPITAL_COMMUNITY)
Admission: RE | Admit: 2017-02-08 | Discharge: 2017-02-08 | Disposition: A | Discharge status: Home / Self Care | Payer: Medicare Other | Source: Ambulatory Visit | Attending: Internal Medicine | Admitting: Internal Medicine

## 2017-02-08 ENCOUNTER — Encounter (HOSPITAL_COMMUNITY): Payer: Self-pay

## 2017-02-08 DIAGNOSIS — Z85118 Personal history of other malignant neoplasm of bronchus and lung: Secondary | ICD-10-CM | POA: Insufficient documentation

## 2017-02-08 DIAGNOSIS — C3411 Malignant neoplasm of upper lobe, right bronchus or lung: Secondary | ICD-10-CM

## 2017-02-08 DIAGNOSIS — Z923 Personal history of irradiation: Secondary | ICD-10-CM | POA: Insufficient documentation

## 2017-02-08 DIAGNOSIS — Z85038 Personal history of other malignant neoplasm of large intestine: Secondary | ICD-10-CM | POA: Diagnosis present

## 2017-02-08 DIAGNOSIS — I2510 Atherosclerotic heart disease of native coronary artery without angina pectoris: Secondary | ICD-10-CM | POA: Insufficient documentation

## 2017-02-08 DIAGNOSIS — J8410 Pulmonary fibrosis, unspecified: Secondary | ICD-10-CM | POA: Insufficient documentation

## 2017-02-08 DIAGNOSIS — K449 Diaphragmatic hernia without obstruction or gangrene: Secondary | ICD-10-CM | POA: Insufficient documentation

## 2017-02-08 DIAGNOSIS — I700 Atherosclerosis of aorta: Secondary | ICD-10-CM | POA: Insufficient documentation

## 2017-02-08 DIAGNOSIS — Z23 Encounter for immunization: Secondary | ICD-10-CM | POA: Diagnosis not present

## 2017-02-08 DIAGNOSIS — J439 Emphysema, unspecified: Secondary | ICD-10-CM | POA: Insufficient documentation

## 2017-02-08 LAB — COMPREHENSIVE METABOLIC PANEL
ALT: 9 U/L (ref 0–55)
AST: 19 U/L (ref 5–34)
Albumin: 3.9 g/dL (ref 3.5–5.0)
Alkaline Phosphatase: 53 U/L (ref 40–150)
Anion Gap: 9 mEq/L (ref 3–11)
BUN: 8.8 mg/dL (ref 7.0–26.0)
CO2: 25 mEq/L (ref 22–29)
Calcium: 9.6 mg/dL (ref 8.4–10.4)
Chloride: 106 mEq/L (ref 98–109)
Creatinine: 1.1 mg/dL (ref 0.7–1.3)
EGFR: 78 mL/min/{1.73_m2} — ABNORMAL LOW (ref >90)
Glucose: 79 mg/dl (ref 70–140)
Potassium: 4.5 mEq/L (ref 3.5–5.1)
Sodium: 140 mEq/L (ref 136–145)
Total Bilirubin: <0.22 mg/dL (ref 0.20–1.20)
Total Protein: 7.8 g/dL (ref 6.4–8.3)

## 2017-02-08 LAB — CBC WITH DIFFERENTIAL/PLATELET
BASO%: 0.2 % (ref 0.0–2.0)
Basophils Absolute: 0 10*3/uL (ref 0.0–0.1)
EOS%: 1.2 % (ref 0.0–7.0)
Eosinophils Absolute: 0.1 10*3/uL (ref 0.0–0.5)
HCT: 41.8 % (ref 38.4–49.9)
HGB: 13.7 g/dL (ref 13.0–17.1)
LYMPH%: 28.7 % (ref 14.0–49.0)
MCH: 30 pg (ref 27.2–33.4)
MCHC: 32.8 g/dL (ref 32.0–36.0)
MCV: 91.7 fL (ref 79.3–98.0)
MONO#: 0.4 10*3/uL (ref 0.1–0.9)
MONO%: 9.9 % (ref 0.0–14.0)
NEUT#: 2.4 10*3/uL (ref 1.5–6.5)
NEUT%: 60 % (ref 39.0–75.0)
Platelets: 189 10*3/uL (ref 140–400)
RBC: 4.56 10*6/uL (ref 4.20–5.82)
RDW: 15.6 % — ABNORMAL HIGH (ref 11.0–14.6)
WBC: 4 10*3/uL (ref 4.0–10.3)
lymph#: 1.2 10*3/uL (ref 0.9–3.3)

## 2017-02-08 MED ORDER — IOPAMIDOL (ISOVUE-300) INJECTION 61%
INTRAVENOUS | Status: AC
  Filled 2017-02-08: qty 75

## 2017-02-08 MED ORDER — IOPAMIDOL (ISOVUE-300) INJECTION 61%
75.0000 mL | Freq: Once | INTRAVENOUS | Status: AC | PRN
  Administered 2017-02-08: 75 mL via INTRAVENOUS

## 2017-02-15 ENCOUNTER — Telehealth: Payer: Self-pay | Admitting: Internal Medicine

## 2017-02-15 ENCOUNTER — Ambulatory Visit (HOSPITAL_BASED_OUTPATIENT_CLINIC_OR_DEPARTMENT_OTHER): Payer: Medicare Other | Admitting: Internal Medicine

## 2017-02-15 ENCOUNTER — Encounter: Payer: Self-pay | Admitting: Internal Medicine

## 2017-02-15 ENCOUNTER — Ambulatory Visit (HOSPITAL_BASED_OUTPATIENT_CLINIC_OR_DEPARTMENT_OTHER): Payer: Medicare Other

## 2017-02-15 VITALS — BP 115/80 | HR 98 | Temp 98.6°F | Resp 18 | Ht 71.0 in | Wt 151.0 lb

## 2017-02-15 VITALS — Ht 71.0 in

## 2017-02-15 DIAGNOSIS — Z85038 Personal history of other malignant neoplasm of large intestine: Secondary | ICD-10-CM | POA: Diagnosis not present

## 2017-02-15 DIAGNOSIS — C3411 Malignant neoplasm of upper lobe, right bronchus or lung: Secondary | ICD-10-CM

## 2017-02-15 MED ORDER — HEPARIN SOD (PORK) LOCK FLUSH 100 UNIT/ML IV SOLN
500.0000 [IU] | Freq: Once | INTRAVENOUS | Status: AC
  Administered 2017-02-15: 500 [IU] via INTRAVENOUS
  Filled 2017-02-15: qty 5

## 2017-02-15 MED ORDER — SODIUM CHLORIDE 0.9% FLUSH
10.0000 mL | Freq: Once | INTRAVENOUS | Status: AC
  Administered 2017-02-15: 10 mL via INTRAVENOUS
  Filled 2017-02-15: qty 10

## 2017-02-15 NOTE — Progress Notes (Signed)
RN visit for port flush. Had not been flushed in several months according to pt's sister. Flushed easily with good blood return

## 2017-02-15 NOTE — Telephone Encounter (Signed)
Gave avs and calendar for October - august 2019

## 2017-02-15 NOTE — Progress Notes (Signed)
[facility] [phone]) [phone]   Fax:([phone]) [phone]  OFFICE PROGRESS NOTE  [name], FNP [address] 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  PRIOR THERAPY:  1) Course of concurrent chemoradiation with weekly carboplatin for AUC of 2 and paclitaxel 45 MG/M2. First dose 03/01/2015. He is status post 6 cycles. Last cycle was given 04/12/2015 with partial response. 2) Consolidation chemotherapy with reduced dose carboplatin for AUC of 5 and paclitaxel 175 MG/M2 every 3 weeks, status post 3 cycles.   CURRENT THERAPY: Observation.  INTERVAL HISTORY: Ivan Daniels 71 y.o. male returns to the clinic today for follow-up visit accompanied by his sister. The patient is feeling fine today with no specific complaints. He gained 5 pounds since his last visit. He denied having any chest pain, shortness of breath, cough or hemoptysis. He denied having any fever or chills. He has no nausea, vomiting, diarrhea or constipation. The patient had repeat CT scan of the chest performed recently and he is here for evaluation and discussion of his scan results.  MEDICAL HISTORY: Past Medical History:  Diagnosis Date  . ALCOHOL ABUSE 11/12/2009   Qualifier: Diagnosis of  By: Hassell Done FNP, Tori Milks    . Allergy   . Anemia   . Anxiety   . BENIGN PROSTATIC HYPERTROPHY, HX OF 12/19/2006   Qualifier: Diagnosis of  By: Radene Ou MD, [place]    . Blood transfusion without reported diagnosis 2017  . Clotting disorder ([place])    takes PLAVIX  . Depression   . Emphysema of lung ([place])   . Full code status 02/10/2015  . GERD (gastroesophageal reflux disease)   . Hyperlipidemia   . Lung mass 01/25/2015   3.7 x 3.6 cm RUL spiculated lung mass with 2.0 cm right  paratracheal lymph node suspicious for bronchogenic CA  . Non-small cell lung cancer ([place]) 01/27/15   non small-cell,squamous cell ca RUL  . Paranoid schizophrenia ([place]) 10/31/2009   Qualifier: Diagnosis of  By: Jorene Minors, [place]    . Primary spontaneous pneumothorax, left 01/25/2015  . TOBACCO ABUSE 05/24/2009   Qualifier: Diagnosis of  By: Hassell Done FNP, [place]      ALLERGIES:  is allergic to benztropine mesylate and chlorpromazine hcl.  MEDICATIONS:  Current Outpatient Prescriptions  Medication Sig Dispense Refill  . benztropine (COGENTIN) 0.5 MG tablet Take 0.5 mg by mouth at bedtime.     . chlorhexidine (PERIDEX) 0.12 % solution 1 mL by Mouth Rinse route as needed.   1  . clopidogrel (PLAVIX) 75 MG tablet Take 75 mg by mouth daily.     . ferrous fumarate (HEMOCYTE - 106 MG FE) 325 (106 FE) MG TABS tablet Take 1 tablet by mouth daily.     . haloperidol (HALDOL) 5 MG tablet Take 5 mg by mouth at bedtime.     . Multiple Vitamins-Minerals (MULTIVITAMIN WITH MINERALS) tablet Take 1 tablet by mouth daily.    . simvastatin (ZOCOR) 20 MG tablet Take 20 mg by mouth at bedtime.     Marland Kitchen umeclidinium-vilanterol (ANORO ELLIPTA) 62.5-25 MCG/INH AEPB Inhale 1 puff into the lungs daily. 1 each 11   Current Facility-Administered Medications  Medication Dose Route Frequency Provider Last Rate Last Dose  . 0.9 %  sodium chloride infusion  500 mL  Intravenous Continuous Irene Shipper, MD      . 0.9 %  sodium chloride infusion  500 mL Intravenous Continuous Irene Shipper, MD      . 0.9 %  sodium chloride infusion  500 mL Intravenous Continuous Irene Shipper, MD        SURGICAL HISTORY:  Past Surgical History:  Procedure Laterality Date  . APPENDECTOMY    . CHEST TUBE INSERTION Left   . COLONOSCOPY    . INSERTION CENTRAL VENOUS ACCESS DEVICE W/ SUBCUTANEOUS PORT Right   . LUNG BIOPSY Right 01/27/2015   Procedure: Right Upper Lobe Bronchus BIOPSY;  Surgeon: Grace Isaac, MD;  Location: [facility];   Service: Thoracic;  Laterality: Right;  Marland Kitchen VIDEO BRONCHOSCOPY WITH ENDOBRONCHIAL ULTRASOUND N/A 01/27/2015   Procedure: VIDEO BRONCHOSCOPY WITH ENDOBRONCHIAL ULTRASOUND;  Surgeon: Grace Isaac, MD;  Location: [facility];  Service: Thoracic;  Laterality: N/A;    REVIEW OF SYSTEMS:  A comprehensive review of systems was negative.   PHYSICAL EXAMINATION: General appearance: alert, cooperative and no distress Head: Normocephalic, without obvious abnormality, atraumatic Neck: no adenopathy, no JVD, supple, symmetrical, trachea midline and thyroid not enlarged, symmetric, no tenderness/mass/nodules Lymph nodes: Cervical, supraclavicular, and axillary nodes normal. Resp: clear to auscultation bilaterally Back: symmetric, no curvature. ROM normal. No CVA tenderness. Cardio: regular rate and rhythm, S1, S2 normal, no murmur, click, rub or gallop GI: soft, non-tender; bowel sounds normal; no masses,  no organomegaly Extremities: extremities normal, atraumatic, no cyanosis or edema  ECOG PERFORMANCE STATUS: 1 - Symptomatic but completely ambulatory  Blood pressure 115/80, pulse 98, temperature 98.6 F (37 C), temperature source Oral, resp. rate 18, height 5\' 11"  (1.803 m), weight 151 lb (68.5 kg), SpO2 99 %.  LABORATORY DATA: Lab Results  Component Value Date   WBC 4.0 02/08/2017   HGB 13.7 02/08/2017   HCT 41.8 02/08/2017   MCV 91.7 02/08/2017   PLT 189 02/08/2017      Chemistry      Component Value Date/Time   NA 140 02/08/2017 0822   K 4.5 02/08/2017 0822   CL 101 01/26/2015 1454   CO2 25 02/08/2017 0822   BUN 8.8 02/08/2017 0822   CREATININE 1.1 02/08/2017 0822      Component Value Date/Time   CALCIUM 9.6 02/08/2017 0822   ALKPHOS 53 02/08/2017 0822   AST 19 02/08/2017 0822   ALT 9 02/08/2017 0822   BILITOT <0.22 02/08/2017 7371       RADIOGRAPHIC STUDIES: Ct Chest W Contrast  Result Date: 02/08/2017 CLINICAL DATA:  Stage IIIA right upper lobe squamous cell lung  carcinoma diagnosed September 2016 status post concurrent chemoradiation therapy and consolidation chemotherapy. Patient presents for restaging with interval observation. EXAM: CT CHEST WITH CONTRAST TECHNIQUE: Multidetector CT imaging of the chest was performed during intravenous contrast administration. CONTRAST:  27mL ISOVUE-300 IOPAMIDOL (ISOVUE-300) INJECTION 61% COMPARISON:  10/04/2016 chest CT. FINDINGS: Cardiovascular: Normal heart size. No significant pericardial fluid/thickening. Right internal jugular MediPort terminates at the cavoatrial junction. Left anterior descending, left circumflex and right coronary atherosclerosis. Atherosclerotic nonaneurysmal thoracic aorta. Normal caliber pulmonary arteries. No central pulmonary emboli. Mediastinum/Nodes: No discrete thyroid nodules. Stable appearance of the esophagus with mild circumferential wall thickening in the midthoracic esophagus. No pathologically enlarged axillary, mediastinal or hilar lymph nodes. Right prevascular 0.9 cm mediastinal top-normal size node (series 2/ image 67), previously 1.1 cm, slightly decreased. Lungs/Pleura: No pneumothorax. Stable loculated trace posterior upper posterior right pleural effusion. No left pleural effusion.  Mild centrilobular and paraseptal emphysema. Stable sharply marginated consolidation in the parahilar upper right lung with associated significant volume loss, distortion and mild bronchiectasis, compatible with radiation fibrosis. No acute consolidative airspace disease, lung masses or significant pulmonary nodules. Upper abdomen: Small hiatal hernia. Musculoskeletal:  No aggressive appearing focal osseous lesions. IMPRESSION: 1. 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 2. No findings suspicious for metastatic disease in the chest . 3. Stable nonspecific mild circumferential wall thickening in the midthoracic esophagus, favor post treatment change  . 4. Three-vessel coronary atherosclerosis. 5. Small hiatal hernia. Aortic Atherosclerosis (ICD10-I70.0) and Emphysema (ICD10-J43.9). Electronically Signed   By: Ilona Sorrel M.D.   On: 02/08/2017 13:14    ASSESSMENT AND PLAN:  This is a very pleasant 71 years old African-American male with a stage IIIA non-small cell lung cancer, squamous cell carcinoma status post concurrent chemoradiation followed by consolidation chemotherapy. The patient has been observation since the completion of the consolidation chemotherapy. He is feeling fine today with no specific complaints. Has repeat CT scan of the chest showed no evidence for disease progression. I discussed the scan results with the patient and his sister. I recommended for him to continue on observation with repeat CT scan of the chest in 6 months. I will also arrange for the patient to have Port-A-Cath flush every 2 months. He was advised to call immediately if he has any concerning symptoms in the interval. The patient voices understanding of current disease status and treatment options and is in agreement with the current care plan. All questions were answered. The patient knows to call the clinic with any problems, questions or concerns. We can certainly see the patient much sooner if necessary. I spent 10 minutes counseling the patient face to face. The total time spent in the appointment was 15 minutes. Disclaimer: This note was dictated with voice recognition software. Similar sounding words can inadvertently be transcribed and may not be corrected upon review.

## 2017-02-15 NOTE — Patient Instructions (Signed)
Implanted Port Home Guide An implanted port is a type of central line that is placed under the skin. Central lines are used to provide IV access when treatment or nutrition needs to be given through a person's veins. Implanted ports are used for long-term IV access. An implanted port may be placed because:  You need IV medicine that would be irritating to the small veins in your hands or arms.  You need long-term IV medicines, such as antibiotics.  You need IV nutrition for a long period.  You need frequent blood draws for lab tests.  You need dialysis.  Implanted ports are usually placed in the chest area, but they can also be placed in the upper arm, the abdomen, or the leg. An implanted port has two main parts:  Reservoir. The reservoir is round and will appear as a small, raised area under your skin. The reservoir is the part where a needle is inserted to give medicines or draw blood.  Catheter. The catheter is a thin, flexible tube that extends from the reservoir. The catheter is placed into a large vein. Medicine that is inserted into the reservoir goes into the catheter and then into the vein.  How will I care for my incision site? Do not get the incision site wet. Bathe or shower as directed by your health care provider. How is my port accessed? Special steps must be taken to access the port:  Before the port is accessed, a numbing cream can be placed on the skin. This helps numb the skin over the port site.  Your health care provider uses a sterile technique to access the port. ? Your health care provider must put on a mask and sterile gloves. ? The skin over your port is cleaned carefully with an antiseptic and allowed to dry. ? The port is gently pinched between sterile gloves, and a needle is inserted into the port.  Only "non-coring" port needles should be used to access the port. Once the port is accessed, a blood return should be checked. This helps ensure that the port  is in the vein and is not clogged.  If your port needs to remain accessed for a constant infusion, a clear (transparent) bandage will be placed over the needle site. The bandage and needle will need to be changed every week, or as directed by your health care provider.  Keep the bandage covering the needle clean and dry. Do not get it wet. Follow your health care provider's instructions on how to take a shower or bath while the port is accessed.  If your port does not need to stay accessed, no bandage is needed over the port.  What is flushing? Flushing helps keep the port from getting clogged. Follow your health care provider's instructions on how and when to flush the port. Ports are usually flushed with saline solution or a medicine called heparin. The need for flushing will depend on how the port is used.  If the port is used for intermittent medicines or blood draws, the port will need to be flushed: ? After medicines have been given. ? After blood has been drawn. ? As part of routine maintenance.  If a constant infusion is running, the port may not need to be flushed.  How long will my port stay implanted? The port can stay in for as long as your health care provider thinks it is needed. When it is time for the port to come out, surgery will be   done to remove it. The procedure is similar to the one performed when the port was put in. When should I seek immediate medical care? When you have an implanted port, you should seek immediate medical care if:  You notice a bad smell coming from the incision site.  You have swelling, redness, or drainage at the incision site.  You have more swelling or pain at the port site or the surrounding area.  You have a fever that is not controlled with medicine.  This information is not intended to replace advice given to you by your health care provider. Make sure you discuss any questions you have with your health care provider. Document  Released: 04/24/2005 Document Revised: 09/30/2015 Document Reviewed: 12/30/2012 Elsevier Interactive Patient Education  2017 Elsevier Inc.  

## 2017-03-20 ENCOUNTER — Encounter: Payer: Self-pay | Admitting: Pulmonary Disease

## 2017-03-20 ENCOUNTER — Ambulatory Visit (INDEPENDENT_AMBULATORY_CARE_PROVIDER_SITE_OTHER): Payer: Medicare Other | Admitting: Pulmonary Disease

## 2017-03-20 VITALS — BP 118/68 | HR 97 | Ht 71.0 in | Wt 154.0 lb

## 2017-03-20 DIAGNOSIS — C3491 Malignant neoplasm of unspecified part of right bronchus or lung: Secondary | ICD-10-CM | POA: Diagnosis not present

## 2017-03-20 DIAGNOSIS — J449 Chronic obstructive pulmonary disease, unspecified: Secondary | ICD-10-CM | POA: Diagnosis not present

## 2017-03-20 MED ORDER — UMECLIDINIUM-VILANTEROL 62.5-25 MCG/INH IN AEPB: 1.0000 | INHALATION_SPRAY | Freq: Every day | RESPIRATORY_TRACT | 11 refills | Status: DC

## 2017-03-20 NOTE — Progress Notes (Signed)
Subjective:    Patient ID: Ivan Daniels, male    DOB: 08-02-1945, 71 y.o.   MRN: [id number]  C.C.:  Follow-up for Moderate-Severe COPD w/ Emphysema & NSCLC Stage IIIA.  HPI Moderate-severe COPD with emphysema: Patient continued on Anoro at last appointment. He reports he is adherent. Minimal coughing & wheezing. No exacerbations since last appointment.  Stage IIIa NSCLC: Squamous cell carcinoma by pathology. Treated in 2016 with concurrent chemotherapy & radiation therapy. Following with medical oncology. CT imaging performed in October shows stable radiation changes in the right lung without evidence of recurrence of malignancy.  Review of Systems No chest pain, pressure or tightness. No abdominal pain or nausea. No fever or chills.   Allergies  Allergen Reactions  . Benztropine Mesylate     REACTION: decrease ROM neck. Pt states he is not allergic   . Chlorpromazine Hcl     Unknown/ per pt causes him to draw up    Current Outpatient Medications on File Prior to Visit  Medication Sig Dispense Refill  . benztropine (COGENTIN) 0.5 MG tablet Take 0.5 mg by mouth at bedtime.     . chlorhexidine (PERIDEX) 0.12 % solution 1 mL by Mouth Rinse route as needed.   1  . clopidogrel (PLAVIX) 75 MG tablet Take 75 mg by mouth daily.     . ferrous fumarate (HEMOCYTE - 106 MG FE) 325 (106 FE) MG TABS tablet Take 1 tablet by mouth daily.     . haloperidol (HALDOL) 5 MG tablet Take 5 mg by mouth at bedtime.     . Multiple Vitamins-Minerals (MULTIVITAMIN WITH MINERALS) tablet Take 1 tablet by mouth daily.    . simvastatin (ZOCOR) 20 MG tablet Take 20 mg by mouth at bedtime.     Marland Kitchen umeclidinium-vilanterol (ANORO ELLIPTA) 62.5-25 MCG/INH AEPB Inhale 1 puff into the lungs daily. 1 each 11   Current Facility-Administered Medications on File Prior to Visit  Medication Dose Route Frequency Provider Last Rate Last Dose  . 0.9 %  sodium chloride infusion  500 mL Intravenous Continuous Irene Shipper, MD       . 0.9 %  sodium chloride infusion  500 mL Intravenous Continuous Irene Shipper, MD      . 0.9 %  sodium chloride infusion  500 mL Intravenous Continuous Irene Shipper, MD        Past Medical History:  Diagnosis Date  . ALCOHOL ABUSE 11/12/2009   Qualifier: Diagnosis of  By: Hassell Done FNP, Tori Milks    . Allergy   . Anemia   . Anxiety   . BENIGN PROSTATIC HYPERTROPHY, HX OF 12/19/2006   Qualifier: Diagnosis of  By: Radene Ou MD, [place]    . Blood transfusion without reported diagnosis 2017  . Clotting disorder ([place])    takes PLAVIX  . Depression   . Emphysema of lung ([place])   . Full code status 02/10/2015  . GERD (gastroesophageal reflux disease)   . Hyperlipidemia   . Lung mass 01/25/2015   3.7 x 3.6 cm RUL spiculated lung mass with 2.0 cm right paratracheal lymph node suspicious for bronchogenic CA  . Non-small cell lung cancer ([place]) 01/27/15   non small-cell,squamous cell ca RUL  . Paranoid schizophrenia ([place]) 10/31/2009   Qualifier: Diagnosis of  By: Jorene Minors, [place]    . Primary spontaneous pneumothorax, left 01/25/2015  . TOBACCO ABUSE 05/24/2009   Qualifier: Diagnosis of  By: Hassell Done FNP, Tori Milks      Past Surgical History:  Procedure Laterality Date  . APPENDECTOMY    . CHEST TUBE INSERTION Left   . COLONOSCOPY    . INSERTION CENTRAL VENOUS ACCESS DEVICE W/ SUBCUTANEOUS PORT Right     Family History  Problem Relation Age of Onset  . Breast cancer Sister   . Diabetes Sister   . Hyperlipidemia Sister   . Lung disease Neg Hx   . Colon cancer Neg Hx   . Esophageal cancer Neg Hx   . Stomach cancer Neg Hx   . Pancreatic cancer Neg Hx   . Prostate cancer Neg Hx   . Rectal cancer Neg Hx     Social History   Socioeconomic History  . Marital status: Single    Spouse name: None  . Number of children: None  . Years of education: None  . Highest education level: None  Social Needs  . Financial resource strain: None  . Food insecurity - worry: None  . Food  insecurity - inability: None  . Transportation needs - medical: None  . Transportation needs - non-medical: None  Occupational History  . None  Tobacco Use  . Smoking status: Former Smoker    Packs/day: 0.25    Years: 52.00    Pack years: 13.00    Types: Cigarettes    Last attempt to quit: 01/07/2016    Years since quitting: 1.2  . Smokeless tobacco: Never Used  . Tobacco comment: Peak rate of 4-5 cigarettes per day  Substance and Sexual Activity  . Alcohol use: No    Alcohol/week: 0.0 oz  . Drug use: No  . Sexual activity: No  Other Topics Concern  . None  Social History Narrative   [facility] (05/12/16):   Originally from [facility]. Has always lived in [place]. 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 No known asbestos exposure. Significant dust and cotton dust exposure. No pets currently. No bird or mold exposure.       Objective:   Physical Exam BP 118/68 (BP Location: Left Arm, Cuff Size: Normal)   Pulse 97   Ht 5\' 11"  (1.803 m)   Wt 154 lb (69.9 kg)   SpO2 96%   BMI 21.48 kg/m   General:  Awake. No distress. Comfortable.  Integument: No rash. Warm. Dry. Extremities:  No cyanosis or clubbing.  HEENT:  No nasal turbinate swelling. Moist membranes. No scleral icterus.  Cardiovascular:  Regular rate. No edema. No appreciable JVD.  Pulmonary:  Clear bilaterally with auscultation. Normal work of breathing on room air.  Abdomen: Soft. Normal bowel sounds. Nondistended.  Musculoskeletal:  Normal bulk and tone. No joint deformity or effusion appreciated. Neurological:  Cranial nerves 2-12 grossly in tact. No meningismus. Moving all 4 extremities equally.   PFT 06/13/16: FVC 2.74 L (68%) FEV1 1.75 (57%) FEV1/FVC is 0.64 FEF 25-75 1.00) 3%) negative bronchodilator response TLC 4.44 L (61%) RV 76% ERV 122% DLCO corrected 38%  6MWT 06/15/16:  Walked 222 meters / Baseline Sat 98% on RA / Nadir Sat 97% on RA 2 minutes after walk  IMAGING CT CHEST W/ CONTRAST 02/08/17  (personally reviewed by me):  Right perihilar and upper lobe opacity with consolidation consistent with postradiation fibrosis. This is not significantly changed with comparison to prior CT scans. No new nodule or opacity appreciated. No pleural effusion or thickening. No pericardial effusion. No pathologic mediastinal adenopathy. Volume loss on the right appears chronic with fibrotic changes.   ESOPHAGRAM/BARIUM SWALLOW 05/19/16 (per radiologist):  Smoothly tapered stricture of  the proximal esophagus at the level of the thoracic inlet preventing passage of barium tablet. Moderate esophageal dysmotility.  CT CHEST W/ CONTRAST 02/28/16 (previously reviewed by me): Atypical predominant emphysema & subpleural bleb formation. Right hilar consolidation suggesting fibrotic change with continued stability. Hyperexpansion of left lung volume loss on the right. No new nodule or opacity appreciated. No pathologic mediastinal adenopathy. No pleural effusion or thickening. No pericardial effusion.  PATHOLOGY Right Upper Lobe Bronchial Brush (01/27/15):  Squamous Cell Carcinoma Right Upper Lobe Endobronchial Biopsy (01/27/15):  Squamous Cell Carcinoma EBUS Level 7 FNA (01/27/15):  Squamous Cell Carcinoma   LABS 05/12/16 Alpha-1 antitrypsin: MM (151)    Assessment & Plan:  71 y.o. male with underlying moderate-severe COPD with history of stage IIIa lung cancer of the right lung. Reviewed his chest CT which shows no signs of lung cancer recurrence. Symptomatically the patient's COPD is very well controlled with his current inhaler. I instructed the patient and his wife to contact my office if he had any new breathing problems before his next appointment as we would be happy to see him back sooner if needed.  1. Moderate-severe COPD with emphysema: Continuing Anoro. No changes. 2. Stage III lung cancer: Continuing follow-up with medical oncology and monitoring. 3. Health maintenance: Status post Prevnar Vaccine  January 2018 & Pneumovax 29 December 2002. Reports already had Flu Vaccine this year.  4. Follow-up: Return to clinic in 1 year or sooner if needed.  Sonia Baller Ashok Cordia, M.D. [facility] Pager:  [phone] After 3pm or if no response, call [phone] 1:37 PM 03/20/17

## 2017-03-20 NOTE — Patient Instructions (Addendum)
   Continue using your Anoro as prescribed.  Call our office if you have any new breathing problems before your next appointment.   We will see you back in 1 year or sooner if needed.

## 2017-03-23 DIAGNOSIS — N400 Enlarged prostate without lower urinary tract symptoms: Secondary | ICD-10-CM | POA: Diagnosis not present

## 2017-03-23 DIAGNOSIS — Z125 Encounter for screening for malignant neoplasm of prostate: Secondary | ICD-10-CM | POA: Diagnosis not present

## 2017-04-03 DIAGNOSIS — F209 Schizophrenia, unspecified: Secondary | ICD-10-CM | POA: Diagnosis not present

## 2017-04-13 ENCOUNTER — Ambulatory Visit (HOSPITAL_BASED_OUTPATIENT_CLINIC_OR_DEPARTMENT_OTHER): Payer: Medicare Other

## 2017-04-13 DIAGNOSIS — Z85038 Personal history of other malignant neoplasm of large intestine: Secondary | ICD-10-CM | POA: Diagnosis not present

## 2017-04-13 DIAGNOSIS — Z452 Encounter for adjustment and management of vascular access device: Secondary | ICD-10-CM

## 2017-04-13 DIAGNOSIS — Z95828 Presence of other vascular implants and grafts: Secondary | ICD-10-CM

## 2017-04-13 MED ORDER — SODIUM CHLORIDE 0.9% FLUSH
10.0000 mL | INTRAVENOUS | Status: DC | PRN
  Administered 2017-04-13: 10 mL via INTRAVENOUS
  Filled 2017-04-13: qty 10

## 2017-04-13 MED ORDER — HEPARIN SOD (PORK) LOCK FLUSH 100 UNIT/ML IV SOLN
500.0000 [IU] | Freq: Once | INTRAVENOUS | Status: AC
  Administered 2017-04-13: 500 [IU] via INTRAVENOUS
  Filled 2017-04-13: qty 5

## 2017-04-13 NOTE — Patient Instructions (Signed)

## 2017-04-24 ENCOUNTER — Ambulatory Visit (INDEPENDENT_AMBULATORY_CARE_PROVIDER_SITE_OTHER): Payer: Medicare Other | Admitting: Podiatry

## 2017-04-24 ENCOUNTER — Encounter: Payer: Self-pay | Admitting: Podiatry

## 2017-04-24 DIAGNOSIS — Q828 Other specified congenital malformations of skin: Secondary | ICD-10-CM

## 2017-04-24 DIAGNOSIS — S98139S Complete traumatic amputation of one unspecified lesser toe, sequela: Secondary | ICD-10-CM

## 2017-04-24 DIAGNOSIS — I739 Peripheral vascular disease, unspecified: Secondary | ICD-10-CM

## 2017-04-24 NOTE — Progress Notes (Signed)
Subjective:     Patient ID: Ivan Daniels, male   DOB: June 14, 1945, 71 y.o.   MRN: [id number]  HPIThis patient presents to my office with painful areas on the bottom of both feet.  He says he lost his toes to frostbite and now develops callused areas on the bottom of both feet. Patient is taking plavix.   Review of Systems     Objective:   Physical Exam Objective: Review of past medical history, medications, social history and allergies were performed.  Vascular: Dorsalis pedis and posterior tibial pulses were palpable B/L, capillary refill was  WNL B/L, temperature gradient was WNL B/L   Skin:  No signs of symptoms of infection or ulcers on both feet.  Porokeratosis  At the level 1,2  Distal aspect toes  B/l  Sensory: Thornell Mule monifilament WNL   Orthopedic: Orthopedic evaluation demonstrates all joints distal t ankle have full ROM without crepitus, muscle power WNL B/L    Assessment:     Porokeratosis B/L     Plan:    Debridement of porokeratosis RTC 10 weeks .    Gardiner Barefoot DPM

## 2017-05-14 DIAGNOSIS — Z79899 Other long term (current) drug therapy: Secondary | ICD-10-CM | POA: Diagnosis not present

## 2017-05-14 DIAGNOSIS — R636 Underweight: Secondary | ICD-10-CM | POA: Diagnosis not present

## 2017-05-14 DIAGNOSIS — R5383 Other fatigue: Secondary | ICD-10-CM | POA: Diagnosis not present

## 2017-05-14 DIAGNOSIS — E782 Mixed hyperlipidemia: Secondary | ICD-10-CM | POA: Diagnosis not present

## 2017-05-14 DIAGNOSIS — M79672 Pain in left foot: Secondary | ICD-10-CM | POA: Diagnosis not present

## 2017-05-14 DIAGNOSIS — E611 Iron deficiency: Secondary | ICD-10-CM | POA: Diagnosis not present

## 2017-05-14 DIAGNOSIS — Z87891 Personal history of nicotine dependence: Secondary | ICD-10-CM | POA: Diagnosis not present

## 2017-06-06 DIAGNOSIS — F209 Schizophrenia, unspecified: Secondary | ICD-10-CM | POA: Diagnosis not present

## 2017-06-18 ENCOUNTER — Inpatient Hospital Stay: Payer: Medicare Other | Attending: Internal Medicine

## 2017-06-18 VITALS — BP 114/70 | HR 99 | Temp 98.3°F | Resp 20

## 2017-06-18 DIAGNOSIS — Z452 Encounter for adjustment and management of vascular access device: Secondary | ICD-10-CM | POA: Insufficient documentation

## 2017-06-18 DIAGNOSIS — Z85038 Personal history of other malignant neoplasm of large intestine: Secondary | ICD-10-CM | POA: Diagnosis not present

## 2017-06-18 DIAGNOSIS — Z95828 Presence of other vascular implants and grafts: Secondary | ICD-10-CM

## 2017-06-18 MED ORDER — HEPARIN SOD (PORK) LOCK FLUSH 100 UNIT/ML IV SOLN
500.0000 [IU] | Freq: Once | INTRAVENOUS | Status: AC
  Administered 2017-06-18: 500 [IU] via INTRAVENOUS
  Filled 2017-06-18: qty 5

## 2017-06-18 MED ORDER — SODIUM CHLORIDE 0.9% FLUSH
10.0000 mL | INTRAVENOUS | Status: DC | PRN
  Administered 2017-06-18: 10 mL via INTRAVENOUS
  Filled 2017-06-18: qty 10

## 2017-06-18 NOTE — Patient Instructions (Signed)
Implanted Port Home Guide An implanted port is a type of central line that is placed under the skin. Central lines are used to provide IV access when treatment or nutrition needs to be given through a person's veins. Implanted ports are used for long-term IV access. An implanted port may be placed because:  You need IV medicine that would be irritating to the small veins in your hands or arms.  You need long-term IV medicines, such as antibiotics.  You need IV nutrition for a long period.  You need frequent blood draws for lab tests.  You need dialysis.  Implanted ports are usually placed in the chest area, but they can also be placed in the upper arm, the abdomen, or the leg. An implanted port has two main parts:  Reservoir. The reservoir is round and will appear as a small, raised area under your skin. The reservoir is the part where a needle is inserted to give medicines or draw blood.  Catheter. The catheter is a thin, flexible tube that extends from the reservoir. The catheter is placed into a large vein. Medicine that is inserted into the reservoir goes into the catheter and then into the vein.  How will I care for my incision site? Do not get the incision site wet. Bathe or shower as directed by your health care provider. How is my port accessed? Special steps must be taken to access the port:  Before the port is accessed, a numbing cream can be placed on the skin. This helps numb the skin over the port site.  Your health care provider uses a sterile technique to access the port. ? Your health care provider must put on a mask and sterile gloves. ? The skin over your port is cleaned carefully with an antiseptic and allowed to dry. ? The port is gently pinched between sterile gloves, and a needle is inserted into the port.  Only "non-coring" port needles should be used to access the port. Once the port is accessed, a blood return should be checked. This helps ensure that the port  is in the vein and is not clogged.  If your port needs to remain accessed for a constant infusion, a clear (transparent) bandage will be placed over the needle site. The bandage and needle will need to be changed every week, or as directed by your health care provider.  Keep the bandage covering the needle clean and dry. Do not get it wet. Follow your health care provider's instructions on how to take a shower or bath while the port is accessed.  If your port does not need to stay accessed, no bandage is needed over the port.  What is flushing? Flushing helps keep the port from getting clogged. Follow your health care provider's instructions on how and when to flush the port. Ports are usually flushed with saline solution or a medicine called heparin. The need for flushing will depend on how the port is used.  If the port is used for intermittent medicines or blood draws, the port will need to be flushed: ? After medicines have been given. ? After blood has been drawn. ? As part of routine maintenance.  If a constant infusion is running, the port may not need to be flushed.  How long will my port stay implanted? The port can stay in for as long as your health care provider thinks it is needed. When it is time for the port to come out, surgery will be   done to remove it. The procedure is similar to the one performed when the port was put in. When should I seek immediate medical care? When you have an implanted port, you should seek immediate medical care if:  You notice a bad smell coming from the incision site.  You have swelling, redness, or drainage at the incision site.  You have more swelling or pain at the port site or the surrounding area.  You have a fever that is not controlled with medicine.  This information is not intended to replace advice given to you by your health care provider. Make sure you discuss any questions you have with your health care provider. Document  Released: 04/24/2005 Document Revised: 09/30/2015 Document Reviewed: 12/30/2012 Elsevier Interactive Patient Education  2017 Elsevier Inc.  

## 2017-06-19 ENCOUNTER — Telehealth: Payer: Self-pay | Admitting: Medical Oncology

## 2017-06-19 NOTE — Telephone Encounter (Signed)
I came our to lobby to talk to Earlie Server ,but no one answered so I left her  VM to return my call.

## 2017-06-26 DIAGNOSIS — R972 Elevated prostate specific antigen [PSA]: Secondary | ICD-10-CM | POA: Diagnosis not present

## 2017-07-03 ENCOUNTER — Ambulatory Visit (INDEPENDENT_AMBULATORY_CARE_PROVIDER_SITE_OTHER): Payer: Medicare Other | Admitting: Podiatry

## 2017-07-03 ENCOUNTER — Encounter: Payer: Self-pay | Admitting: Podiatry

## 2017-07-03 DIAGNOSIS — D689 Coagulation defect, unspecified: Secondary | ICD-10-CM | POA: Diagnosis not present

## 2017-07-03 DIAGNOSIS — Q828 Other specified congenital malformations of skin: Secondary | ICD-10-CM | POA: Diagnosis not present

## 2017-07-03 DIAGNOSIS — I739 Peripheral vascular disease, unspecified: Secondary | ICD-10-CM

## 2017-07-03 DIAGNOSIS — S98139S Complete traumatic amputation of one unspecified lesser toe, sequela: Secondary | ICD-10-CM

## 2017-07-03 MED ORDER — NONFORMULARY OR COMPOUNDED ITEM: 3 refills | Status: AC

## 2017-07-03 NOTE — Addendum Note (Signed)
Addended byDeidre Ala, Amijah Timothy L on: 07/03/2017 05:18 PM   Modules accepted: Orders

## 2017-07-03 NOTE — Progress Notes (Addendum)
Subjective:     Patient ID: Ivan Daniels, male   DOB: [birth year], 72 y.o.   MRN: [id number]  HPIThis patient presents to my office with painful areas on the bottom of both feet.  He says he lost his toes to frostbite and now develops callused areas on the bottom of both feet. Patient is taking plavix.   Review of Systems     Objective:   Physical Exam Objective: Review of past medical history, medications, social history and allergies were performed.  Vascular: Dorsalis pedis and posterior tibial pulses were palpable B/L, capillary refill was  WNL B/L, temperature gradient was WNL B/L   Skin:  No signs of symptoms of infection or ulcers on both feet.  Porokeratosis  At the level 1,2  Distal aspect toes  B/l  Sensory: Thornell Mule monifilament WNL   Orthopedic: Orthopedic evaluation demonstrates all joints distal t ankle have full ROM without crepitus, muscle power WNL B/L    Assessment:     Porokeratosis B/L     Plan:    Debridement of porokeratosis RTC 10 weeks .  Ordered pain medicine from [employer].  Gardiner Barefoot DPM

## 2017-08-16 ENCOUNTER — Inpatient Hospital Stay: Payer: Medicare Other

## 2017-08-16 ENCOUNTER — Encounter (HOSPITAL_COMMUNITY): Payer: Self-pay

## 2017-08-16 ENCOUNTER — Ambulatory Visit (HOSPITAL_COMMUNITY)
Admission: RE | Admit: 2017-08-16 | Discharge: 2017-08-16 | Disposition: A | Discharge status: Home / Self Care | Payer: Medicare Other | Source: Ambulatory Visit | Attending: Internal Medicine | Admitting: Internal Medicine

## 2017-08-16 ENCOUNTER — Inpatient Hospital Stay: Payer: Medicare Other | Attending: Internal Medicine

## 2017-08-16 DIAGNOSIS — Z9221 Personal history of antineoplastic chemotherapy: Secondary | ICD-10-CM | POA: Diagnosis not present

## 2017-08-16 DIAGNOSIS — C3411 Malignant neoplasm of upper lobe, right bronchus or lung: Secondary | ICD-10-CM | POA: Diagnosis not present

## 2017-08-16 DIAGNOSIS — Z923 Personal history of irradiation: Secondary | ICD-10-CM | POA: Insufficient documentation

## 2017-08-16 DIAGNOSIS — J449 Chronic obstructive pulmonary disease, unspecified: Secondary | ICD-10-CM

## 2017-08-16 DIAGNOSIS — Z85038 Personal history of other malignant neoplasm of large intestine: Secondary | ICD-10-CM | POA: Diagnosis not present

## 2017-08-16 DIAGNOSIS — I700 Atherosclerosis of aorta: Secondary | ICD-10-CM | POA: Diagnosis not present

## 2017-08-16 DIAGNOSIS — C3490 Malignant neoplasm of unspecified part of unspecified bronchus or lung: Secondary | ICD-10-CM | POA: Diagnosis not present

## 2017-08-16 LAB — CBC WITH DIFFERENTIAL/PLATELET
Basophils Absolute: 0 10*3/uL (ref 0.0–0.1)
Basophils Relative: 0 %
Eosinophils Absolute: 0.1 10*3/uL (ref 0.0–0.5)
Eosinophils Relative: 1 %
HCT: 36.7 % — ABNORMAL LOW (ref 38.4–49.9)
Hemoglobin: 11.6 g/dL — ABNORMAL LOW (ref 13.0–17.1)
Lymphocytes Relative: 29 %
Lymphs Abs: 1.4 10*3/uL (ref 0.9–3.3)
MCH: 28.7 pg (ref 27.2–33.4)
MCHC: 31.6 g/dL — ABNORMAL LOW (ref 32.0–36.0)
MCV: 90.8 fL (ref 79.3–98.0)
Monocytes Absolute: 0.3 10*3/uL (ref 0.1–0.9)
Monocytes Relative: 7 %
Neutro Abs: 3.1 10*3/uL (ref 1.5–6.5)
Neutrophils Relative %: 63 %
Platelets: 232 10*3/uL (ref 140–400)
RBC: 4.04 MIL/uL — ABNORMAL LOW (ref 4.20–5.82)
RDW: 16 % — ABNORMAL HIGH (ref 11.0–14.6)
WBC: 4.8 10*3/uL (ref 4.0–10.3)

## 2017-08-16 LAB — COMPREHENSIVE METABOLIC PANEL
ALT: 11 U/L (ref 0–55)
AST: 18 U/L (ref 5–34)
Albumin: 3.7 g/dL (ref 3.5–5.0)
Alkaline Phosphatase: 54 U/L (ref 40–150)
Anion gap: 8 (ref 3–11)
BUN: 9 mg/dL (ref 7–26)
CO2: 25 mmol/L (ref 22–29)
Calcium: 9.4 mg/dL (ref 8.4–10.4)
Chloride: 107 mmol/L (ref 98–109)
Creatinine, Ser: 1.09 mg/dL (ref 0.70–1.30)
GFR calc Af Amer: >60 mL/min (ref >60)
GFR calc non Af Amer: >60 mL/min (ref >60)
Glucose, Bld: 95 mg/dL (ref 70–140)
Potassium: 4 mmol/L (ref 3.5–5.1)
Sodium: 140 mmol/L (ref 136–145)
Total Bilirubin: <0.2 mg/dL — ABNORMAL LOW (ref 0.2–1.2)
Total Protein: 7.5 g/dL (ref 6.4–8.3)

## 2017-08-16 MED ORDER — SODIUM CHLORIDE 0.9% FLUSH
10.0000 mL | INTRAVENOUS | Status: DC | PRN
  Administered 2017-08-16: 10 mL
  Filled 2017-08-16: qty 10

## 2017-08-16 MED ORDER — IOHEXOL 300 MG/ML  SOLN
75.0000 mL | Freq: Once | INTRAMUSCULAR | Status: AC | PRN
  Administered 2017-08-16: 75 mL via INTRAVENOUS

## 2017-08-16 MED ORDER — HEPARIN SOD (PORK) LOCK FLUSH 100 UNIT/ML IV SOLN
INTRAVENOUS | Status: AC
  Filled 2017-08-16: qty 5

## 2017-08-16 MED ORDER — HEPARIN SOD (PORK) LOCK FLUSH 100 UNIT/ML IV SOLN
500.0000 [IU] | Freq: Once | INTRAVENOUS | Status: AC
  Administered 2017-08-16: 500 [IU] via INTRAVENOUS

## 2017-08-20 ENCOUNTER — Telehealth: Payer: Self-pay | Admitting: Internal Medicine

## 2017-08-20 ENCOUNTER — Inpatient Hospital Stay (HOSPITAL_BASED_OUTPATIENT_CLINIC_OR_DEPARTMENT_OTHER): Payer: Medicare Other | Admitting: Internal Medicine

## 2017-08-20 ENCOUNTER — Encounter: Payer: Self-pay | Admitting: Internal Medicine

## 2017-08-20 VITALS — BP 120/68 | HR 103 | Temp 98.2°F | Resp 18 | Ht 71.0 in | Wt 152.5 lb

## 2017-08-20 DIAGNOSIS — Z923 Personal history of irradiation: Secondary | ICD-10-CM

## 2017-08-20 DIAGNOSIS — Z85038 Personal history of other malignant neoplasm of large intestine: Secondary | ICD-10-CM

## 2017-08-20 DIAGNOSIS — C3411 Malignant neoplasm of upper lobe, right bronchus or lung: Secondary | ICD-10-CM

## 2017-08-20 DIAGNOSIS — C3491 Malignant neoplasm of unspecified part of right bronchus or lung: Secondary | ICD-10-CM

## 2017-08-20 DIAGNOSIS — Z9221 Personal history of antineoplastic chemotherapy: Secondary | ICD-10-CM | POA: Diagnosis not present

## 2017-08-20 DIAGNOSIS — C3490 Malignant neoplasm of unspecified part of unspecified bronchus or lung: Secondary | ICD-10-CM

## 2017-08-20 NOTE — Progress Notes (Signed)
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  PRIOR THERAPY:  1) Course of concurrent chemoradiation with weekly carboplatin for AUC of 2 and paclitaxel 45 MG/M2. First dose 03/01/2015. He is status post 6 cycles. Last cycle was given 04/12/2015 with partial response. 2) Consolidation chemotherapy with reduced dose carboplatin for AUC of 5 and paclitaxel 175 MG/M2 every 3 weeks, status post 3 cycles.  CURRENT THERAPY: Observation.  INTERVAL HISTORY: Ivan Daniels 72 y.o. male returns to the clinic today for follow-up visit accompanied by his sister.  The patient is feeling fine today with no specific complaints.  He denied having any chest pain, shortness of breath except with exertion, cough or hemoptysis.  He denied having any weight loss or night sweats.  He has no nausea, vomiting, diarrhea or constipation.  He denied having any fever or chills.  The patient had repeat CT scan of the chest performed recently and is here for evaluation and discussion of his scan results.   MEDICAL HISTORY: Past Medical History:  Diagnosis Date  . ALCOHOL ABUSE 11/12/2009   Qualifier: Diagnosis of  By: Hassell Done FNP, Tori Milks    . Allergy   . Anemia   . Anxiety   . BENIGN PROSTATIC HYPERTROPHY, HX OF 12/19/2006   Qualifier: Diagnosis of  By: Radene Ou MD, [place]    . Blood transfusion without reported diagnosis 2017  . Clotting disorder ([place])    takes PLAVIX  . Depression   . Emphysema of lung ([place])   . Full code status 02/10/2015  . GERD (gastroesophageal reflux disease)   . Hyperlipidemia   . Lung mass 01/25/2015   3.7 x 3.6 cm RUL spiculated  lung mass with 2.0 cm right paratracheal lymph node suspicious for bronchogenic CA  . Non-small cell lung cancer ([place]) 01/27/15   non small-cell,squamous cell ca RUL  . Paranoid schizophrenia ([place]) 10/31/2009   Qualifier: Diagnosis of  By: Jorene Minors, [place]    . Primary spontaneous pneumothorax, left 01/25/2015  . TOBACCO ABUSE 05/24/2009   Qualifier: Diagnosis of  By: Hassell Done FNP, [place]      ALLERGIES:  is allergic to benztropine mesylate and chlorpromazine hcl.  MEDICATIONS:  Current Outpatient Medications  Medication Sig Dispense Refill  . ALPHAGAN P 0.1 % SOLN Place 1 drop into both eyes 3 (three) times daily.  6  . benztropine (COGENTIN) 0.5 MG tablet Take 0.5 mg by mouth at bedtime.     . chlorhexidine (PERIDEX) 0.12 % solution 1 mL by Mouth Rinse route as needed.   1  . clopidogrel (PLAVIX) 75 MG tablet Take 75 mg by mouth daily.     . ferrous fumarate (HEMOCYTE - 106 MG FE) 325 (106 FE) MG TABS tablet Take 1 tablet by mouth daily.     . haloperidol (HALDOL) 5 MG tablet Take 5 mg by mouth at bedtime.     . Multiple Vitamins-Minerals (MULTIVITAMIN WITH MINERALS) tablet Take 1 tablet by mouth daily.    . NONFORMULARY OR COMPOUNDED [place]  Combination Pain Cream -  Baclofen 2%, Doxepin 5%, Gabapentin 6%, Topiramate 2%, Pentoxifylline 3% Apply 1-2 grams to affected area 3-4 times daily  Qty. 120 gm 3 refills 1 each 3  . simvastatin (ZOCOR) 20 MG tablet Take 20 mg by mouth at bedtime.     Marland Kitchen umeclidinium-vilanterol (ANORO ELLIPTA) 62.5-25 MCG/INH AEPB Inhale 1 puff daily into the lungs. 1 each 11   Current Facility-Administered Medications  Medication Dose Route Frequency Provider Last Rate Last Dose  . 0.9 %  sodium chloride infusion  500 mL Intravenous Continuous Irene Shipper, MD      . 0.9 %  sodium chloride infusion  500 mL Intravenous Continuous Irene Shipper, MD      . 0.9 %  sodium chloride infusion  500 mL Intravenous Continuous Irene Shipper, MD         SURGICAL HISTORY:  Past Surgical History:  Procedure Laterality Date  . APPENDECTOMY    . CHEST TUBE INSERTION Left   . COLONOSCOPY    . INSERTION CENTRAL VENOUS ACCESS DEVICE W/ SUBCUTANEOUS PORT Right   . LUNG BIOPSY Right 01/27/2015   Procedure: Right Upper Lobe Bronchus BIOPSY;  Surgeon: Grace Isaac, MD;  Location: [facility];  Service: Thoracic;  Laterality: Right;  Marland Kitchen VIDEO BRONCHOSCOPY WITH ENDOBRONCHIAL ULTRASOUND N/A 01/27/2015   Procedure: VIDEO BRONCHOSCOPY WITH ENDOBRONCHIAL ULTRASOUND;  Surgeon: Grace Isaac, MD;  Location: [facility];  Service: Thoracic;  Laterality: N/A;    REVIEW OF SYSTEMS:  A comprehensive review of systems was negative except for: Respiratory: positive for dyspnea on exertion   PHYSICAL EXAMINATION: General appearance: alert, cooperative and no distress Head: Normocephalic, without obvious abnormality, atraumatic Neck: no adenopathy, no JVD, supple, symmetrical, trachea midline and thyroid not enlarged, symmetric, no tenderness/mass/nodules Lymph nodes: Cervical, supraclavicular, and axillary nodes normal. Resp: clear to auscultation bilaterally Back: symmetric, no curvature. ROM normal. No CVA tenderness. Cardio: regular rate and rhythm, S1, S2 normal, no murmur, click, rub or gallop GI: soft, non-tender; bowel sounds normal; no masses,  no organomegaly Extremities: extremities normal, atraumatic, no cyanosis or edema  ECOG PERFORMANCE STATUS: 1 - Symptomatic but completely ambulatory  Blood pressure 120/68, pulse (!) 103, temperature 98.2 F (36.8 C), temperature source Oral, resp. rate 18, height 5\' 11"  (1.803 m), weight 152 lb 8 oz (69.2 kg), SpO2 100 %.  LABORATORY DATA: Lab Results  Component Value Date   WBC 4.8 08/16/2017   HGB 11.6 (L) 08/16/2017   HCT 36.7 (L) 08/16/2017   MCV 90.8 08/16/2017   PLT 232 08/16/2017      Chemistry      Component Value Date/Time   NA 140 08/16/2017 0843   NA 140 02/08/2017 0822   K 4.0  08/16/2017 0843   K 4.5 02/08/2017 0822   CL 107 08/16/2017 0843   CO2 25 08/16/2017 0843   CO2 25 02/08/2017 0822   BUN 9 08/16/2017 0843   BUN 8.8 02/08/2017 0822   CREATININE 1.09 08/16/2017 0843   CREATININE 1.1 02/08/2017 0822      Component Value Date/Time   CALCIUM 9.4 08/16/2017 0843   CALCIUM 9.6 02/08/2017 0822   ALKPHOS 54 08/16/2017 0843   ALKPHOS 53 02/08/2017 0822   AST 18 08/16/2017 0843   AST 19 02/08/2017 0822   ALT 11 08/16/2017 0843   ALT 9 02/08/2017 0822   BILITOT <0.2 (L) 08/16/2017 0843   BILITOT <0.22 02/08/2017 0822       RADIOGRAPHIC STUDIES: Ct Chest W Contrast  Result Date: 08/16/2017 CLINICAL DATA:  Stage IIIA non-small cell lung cancer. EXAM: CT CHEST WITH CONTRAST TECHNIQUE: Multidetector CT imaging  of the chest was performed during intravenous contrast administration. CONTRAST:  75mL OMNIPAQUE IOHEXOL 300 MG/ML  SOLN COMPARISON:  02/08/2017 FINDINGS: Cardiovascular: Heart size normal. Trace anterior pericardial effusion similar to prior. Coronary artery calcification is evident. No thoracic aortic dissection. Right Port-A-Cath tip at the SVC/RA junction. Mediastinum/Nodes: No discrete lymphadenopathy in the mediastinum or left hilum. 8 mm short axis precarinal lymph node is stable. Abnormal amorphous soft tissue in the right hilum tracking into the mediastinum and posterior retro hilar lung is unchanged. Lungs/Pleura: Similar appearance of post radiation scarring in the medial right upper lung. Right posteromedial focal pleural thickening/fluid is stable. No new or progressive pulmonary nodule or mass. No focal airspace consolidation. No pleural effusion. Upper Abdomen: Tiny hiatal hernia. Scattered tiny hypoattenuating lesions in the liver parenchyma are stable and remain too small to characterize. No nodule or mass within the visualized portion of the adrenal glands. Musculoskeletal: Bone windows reveal no worrisome lytic or sclerotic osseous lesions.  IMPRESSION: 1. Stable exam without new or progressive findings. Radiation scarring in the parahilar right upper lung shows no interval change. 2.  Aortic Atherosclerois (ICD10-170.0) Electronically Signed   By: Misty Stanley M.D.   On: 08/16/2017 13:44    ASSESSMENT AND PLAN:  This is a very pleasant 72 years old African-American male with a stage IIIA non-small cell lung cancer, squamous cell carcinoma status post concurrent chemoradiation followed by consolidation chemotherapy. The patient is current on observation and he is feeling fine. Repeat CT scan of the chest showed no concerning findings for disease progression. I discussed the scan results with the patient and his sister and recommended for him to continue on observation with repeat CT scan of the chest in 4 months. He was advised to call immediately if he has any concerning symptoms in the interval. The patient voices understanding of current disease status and treatment options and is in agreement with the current care plan. All questions were answered. The patient knows to call the clinic with any problems, questions or concerns. We can certainly see the patient much sooner if necessary. I spent 10 minutes counseling the patient face to face. The total time spent in the appointment was 15 minutes. Disclaimer: This note was dictated with voice recognition software. Similar sounding words can inadvertently be transcribed and may not be corrected upon review.

## 2017-08-20 NOTE — Telephone Encounter (Signed)
Gave patient/relative avs report and appointments for August . [facility] radiology will call re scan.

## 2017-09-05 ENCOUNTER — Ambulatory Visit (INDEPENDENT_AMBULATORY_CARE_PROVIDER_SITE_OTHER): Payer: Medicare Other | Admitting: Podiatry

## 2017-09-05 ENCOUNTER — Encounter: Payer: Self-pay | Admitting: Podiatry

## 2017-09-05 DIAGNOSIS — D689 Coagulation defect, unspecified: Secondary | ICD-10-CM | POA: Diagnosis not present

## 2017-09-05 DIAGNOSIS — Q828 Other specified congenital malformations of skin: Secondary | ICD-10-CM | POA: Diagnosis not present

## 2017-09-05 DIAGNOSIS — S98139S Complete traumatic amputation of one unspecified lesser toe, sequela: Secondary | ICD-10-CM

## 2017-09-05 NOTE — Progress Notes (Signed)
Subjective:     Patient ID: Ivan Daniels, male   DOB: [birth year], 72 y.o.   MRN: [id number]  HPIThis patient presents to my office with painful areas on the bottom of both feet.  He says he lost his toes to frostbite and now develops callused areas on the bottom of both feet. Patient is taking plavix.   Review of Systems     Objective:   Physical Exam Objective: Review of past medical history, medications, social history and allergies were performed.  Vascular: Dorsalis pedis and posterior tibial pulses were palpable B/L, capillary refill was  WNL B/L, temperature gradient was WNL B/L   Skin:  No signs of symptoms of infection or ulcers on both feet.  Porokeratosis  At the level 1,2  Distal aspect toes  B/l  Sensory: Thornell Mule monifilament WNL   Orthopedic: Orthopedic evaluation demonstrates all joints distal t ankle have full ROM without crepitus, muscle power WNL B/L    Assessment:     Porokeratosis B/L     Plan:    Debridement of porokeratosis RTC 10 weeks .  Told to use pumice stone.  Gardiner Barefoot DPM

## 2017-09-12 DIAGNOSIS — F209 Schizophrenia, unspecified: Secondary | ICD-10-CM | POA: Diagnosis not present

## 2017-09-20 ENCOUNTER — Ambulatory Visit: Payer: Medicare Other | Admitting: Orthotics

## 2017-09-20 DIAGNOSIS — Q828 Other specified congenital malformations of skin: Secondary | ICD-10-CM

## 2017-09-20 DIAGNOSIS — D689 Coagulation defect, unspecified: Secondary | ICD-10-CM

## 2017-09-20 DIAGNOSIS — S98139S Complete traumatic amputation of one unspecified lesser toe, sequela: Secondary | ICD-10-CM

## 2017-10-04 NOTE — Progress Notes (Signed)
Patient seen today for DBS measurements; has transmet amputation Left; chose [id number]  [employer].

## 2017-10-15 ENCOUNTER — Inpatient Hospital Stay: Payer: Medicare Other | Attending: Internal Medicine

## 2017-10-15 DIAGNOSIS — Z85038 Personal history of other malignant neoplasm of large intestine: Secondary | ICD-10-CM | POA: Insufficient documentation

## 2017-10-15 DIAGNOSIS — Z452 Encounter for adjustment and management of vascular access device: Secondary | ICD-10-CM | POA: Insufficient documentation

## 2017-10-15 DIAGNOSIS — J449 Chronic obstructive pulmonary disease, unspecified: Secondary | ICD-10-CM

## 2017-10-15 MED ORDER — HEPARIN SOD (PORK) LOCK FLUSH 100 UNIT/ML IV SOLN
500.0000 [IU] | Freq: Once | INTRAVENOUS | Status: AC | PRN
  Administered 2017-10-15: 500 [IU]
  Filled 2017-10-15: qty 5

## 2017-10-15 MED ORDER — SODIUM CHLORIDE 0.9% FLUSH
10.0000 mL | INTRAVENOUS | Status: DC | PRN
  Administered 2017-10-15: 10 mL
  Filled 2017-10-15: qty 10

## 2017-10-16 ENCOUNTER — Inpatient Hospital Stay: Payer: Medicare Other

## 2017-11-07 ENCOUNTER — Ambulatory Visit: Payer: Medicare Other | Admitting: Podiatry

## 2017-11-13 ENCOUNTER — Ambulatory Visit (INDEPENDENT_AMBULATORY_CARE_PROVIDER_SITE_OTHER): Payer: Medicare Other | Admitting: Podiatry

## 2017-11-13 ENCOUNTER — Ambulatory Visit (INDEPENDENT_AMBULATORY_CARE_PROVIDER_SITE_OTHER): Payer: Medicare Other | Admitting: Orthotics

## 2017-11-13 ENCOUNTER — Encounter: Payer: Self-pay | Admitting: Podiatry

## 2017-11-13 DIAGNOSIS — I739 Peripheral vascular disease, unspecified: Secondary | ICD-10-CM | POA: Diagnosis not present

## 2017-11-13 DIAGNOSIS — S98139S Complete traumatic amputation of one unspecified lesser toe, sequela: Secondary | ICD-10-CM

## 2017-11-13 DIAGNOSIS — Q828 Other specified congenital malformations of skin: Secondary | ICD-10-CM

## 2017-11-13 DIAGNOSIS — D689 Coagulation defect, unspecified: Secondary | ICD-10-CM

## 2017-11-13 NOTE — Progress Notes (Signed)
Patient came in today to p/u [id number] b/l; patient was evaluated for both fit and comfort; the left toe filler needed minimal triming while the right needed none at all.

## 2017-11-13 NOTE — Progress Notes (Signed)
Subjective:     Patient ID: Ivan Daniels, male   DOB: December 30, 1945, 72 y.o.   MRN: [id number]  HPIThis patient presents to my office with painful areas on the bottom of both feet.  He says he lost his toes to frostbite and now develops callused areas on the bottom of both feet. Patient is taking plavix.   Review of Systems     Objective:   Physical Exam Objective: Review of past medical history, medications, social history and allergies were performed.  Vascular: Dorsalis pedis and posterior tibial pulses were palpable B/L, capillary refill was  WNL B/L, temperature gradient was WNL B/L   Skin:  No signs of symptoms of infection or ulcers on both feet.  Porokeratosis sub 2 right and at site of toe amputation left foot.    Sensory: Thornell Mule monifilament WNL   Orthopedic: Orthopedic evaluation demonstrates all joints distal t ankle have full ROM without crepitus, muscle power WNL B/L    Assessment:     Porokeratosis B/L     Plan:    Debridement of porokeratosis RTC 10 weeks .  Told to use pumice stone. Patient to follow up with [facility].  Gardiner Barefoot DPM

## 2017-11-19 DIAGNOSIS — R636 Underweight: Secondary | ICD-10-CM | POA: Diagnosis not present

## 2017-11-19 DIAGNOSIS — E611 Iron deficiency: Secondary | ICD-10-CM | POA: Diagnosis not present

## 2017-11-19 DIAGNOSIS — Z79899 Other long term (current) drug therapy: Secondary | ICD-10-CM | POA: Diagnosis not present

## 2017-11-19 DIAGNOSIS — Z125 Encounter for screening for malignant neoplasm of prostate: Secondary | ICD-10-CM | POA: Diagnosis not present

## 2017-11-19 DIAGNOSIS — R5383 Other fatigue: Secondary | ICD-10-CM | POA: Diagnosis not present

## 2017-11-19 DIAGNOSIS — E860 Dehydration: Secondary | ICD-10-CM | POA: Diagnosis not present

## 2017-11-19 DIAGNOSIS — K208 Other esophagitis: Secondary | ICD-10-CM | POA: Diagnosis not present

## 2017-11-19 DIAGNOSIS — Z0000 Encounter for general adult medical examination without abnormal findings: Secondary | ICD-10-CM | POA: Diagnosis not present

## 2017-11-19 DIAGNOSIS — D0222 Carcinoma in situ of left bronchus and lung: Secondary | ICD-10-CM | POA: Diagnosis not present

## 2017-11-21 DIAGNOSIS — F209 Schizophrenia, unspecified: Secondary | ICD-10-CM | POA: Diagnosis not present

## 2017-12-01 ENCOUNTER — Encounter (HOSPITAL_COMMUNITY): Payer: Self-pay | Admitting: Nurse Practitioner

## 2017-12-01 DIAGNOSIS — T887XXA Unspecified adverse effect of drug or medicament, initial encounter: Secondary | ICD-10-CM | POA: Diagnosis not present

## 2017-12-01 DIAGNOSIS — Y69 Unspecified misadventure during surgical and medical care: Secondary | ICD-10-CM | POA: Insufficient documentation

## 2017-12-01 DIAGNOSIS — Z5321 Procedure and treatment not carried out due to patient leaving prior to being seen by health care provider: Secondary | ICD-10-CM | POA: Diagnosis not present

## 2017-12-01 NOTE — ED Triage Notes (Signed)
Pt has no complaints but her sister at bedside states that pt has been finger twilling as if having a coin in his hands. She wants him evaluated for a possible medical reaction.

## 2017-12-02 ENCOUNTER — Emergency Department (HOSPITAL_COMMUNITY)
Admission: EM | Admit: 2017-12-02 | Discharge: 2017-12-02 | Discharge status: Against Medical Advice | Payer: Medicare Other | Attending: Emergency Medicine | Admitting: Emergency Medicine

## 2017-12-02 NOTE — ED Notes (Signed)
No answer when called for a room. 

## 2017-12-02 NOTE — ED Notes (Signed)
No answer when called for a room. x3

## 2017-12-03 NOTE — ED Notes (Signed)
Follow up call made  No answer  12/03/17   0945  s Kea Callan rn

## 2017-12-05 DIAGNOSIS — F209 Schizophrenia, unspecified: Secondary | ICD-10-CM | POA: Diagnosis not present

## 2017-12-12 ENCOUNTER — Telehealth: Payer: Self-pay | Admitting: *Deleted

## 2017-12-12 NOTE — Telephone Encounter (Signed)
Oncology Nurse Navigator Documentation  Oncology Nurse Navigator Flowsheets 12/12/2017  Navigator Location [facility]  Navigator Encounter Type Telephone/I followed up on Mr. Mangiaracina schedule.  He needs CT scan. I called his sister and left a message with an update on him needing scan and phone number to call.   Telephone Outgoing Call  Treatment Phase Follow-up  Barriers/Navigation Needs Education  Education Other  Interventions Education  Education Method Verbal  Acuity Level 1  Time Spent with Patient 15

## 2017-12-17 ENCOUNTER — Inpatient Hospital Stay: Payer: Medicare Other | Attending: Internal Medicine

## 2017-12-17 ENCOUNTER — Inpatient Hospital Stay: Payer: Medicare Other

## 2017-12-17 DIAGNOSIS — Z85118 Personal history of other malignant neoplasm of bronchus and lung: Secondary | ICD-10-CM | POA: Insufficient documentation

## 2017-12-17 DIAGNOSIS — J449 Chronic obstructive pulmonary disease, unspecified: Secondary | ICD-10-CM

## 2017-12-17 DIAGNOSIS — Z923 Personal history of irradiation: Secondary | ICD-10-CM | POA: Insufficient documentation

## 2017-12-17 DIAGNOSIS — C3490 Malignant neoplasm of unspecified part of unspecified bronchus or lung: Secondary | ICD-10-CM

## 2017-12-17 DIAGNOSIS — Z720 Tobacco use: Secondary | ICD-10-CM | POA: Diagnosis not present

## 2017-12-17 DIAGNOSIS — Z9221 Personal history of antineoplastic chemotherapy: Secondary | ICD-10-CM | POA: Diagnosis not present

## 2017-12-17 LAB — CMP (CANCER CENTER ONLY)
ALT: 9 U/L (ref 0–44)
AST: 19 U/L (ref 15–41)
Albumin: 3.8 g/dL (ref 3.5–5.0)
Alkaline Phosphatase: 60 U/L (ref 38–126)
Anion gap: 10 (ref 5–15)
BUN: 10 mg/dL (ref 8–23)
CO2: 24 mmol/L (ref 22–32)
Calcium: 9.1 mg/dL (ref 8.9–10.3)
Chloride: 104 mmol/L (ref 98–111)
Creatinine: 1.07 mg/dL (ref 0.61–1.24)
GFR, Est AFR Am: >60 mL/min (ref >60)
GFR, Estimated: >60 mL/min (ref >60)
Glucose, Bld: 94 mg/dL (ref 70–99)
Potassium: 4 mmol/L (ref 3.5–5.1)
Sodium: 138 mmol/L (ref 135–145)
Total Bilirubin: 0.3 mg/dL (ref 0.3–1.2)
Total Protein: 8 g/dL (ref 6.5–8.1)

## 2017-12-17 LAB — CBC WITH DIFFERENTIAL (CANCER CENTER ONLY)
Basophils Absolute: 0 10*3/uL (ref 0.0–0.1)
Basophils Relative: 0 %
Eosinophils Absolute: 0 10*3/uL (ref 0.0–0.5)
Eosinophils Relative: 1 %
HCT: 37.7 % — ABNORMAL LOW (ref 38.4–49.9)
Hemoglobin: 12 g/dL — ABNORMAL LOW (ref 13.0–17.1)
Lymphocytes Relative: 26 %
Lymphs Abs: 1.5 10*3/uL (ref 0.9–3.3)
MCH: 27.6 pg (ref 27.2–33.4)
MCHC: 31.8 g/dL — ABNORMAL LOW (ref 32.0–36.0)
MCV: 86.9 fL (ref 79.3–98.0)
Monocytes Absolute: 0.4 10*3/uL (ref 0.1–0.9)
Monocytes Relative: 8 %
Neutro Abs: 3.8 10*3/uL (ref 1.5–6.5)
Neutrophils Relative %: 65 %
Platelet Count: 276 10*3/uL (ref 140–400)
RBC: 4.34 MIL/uL (ref 4.20–5.82)
RDW: 16.5 % — ABNORMAL HIGH (ref 11.0–14.6)
WBC Count: 5.8 10*3/uL (ref 4.0–10.3)

## 2017-12-17 MED ORDER — HEPARIN SOD (PORK) LOCK FLUSH 100 UNIT/ML IV SOLN
500.0000 [IU] | Freq: Once | INTRAVENOUS | Status: AC | PRN
  Administered 2017-12-17: 500 [IU]
  Filled 2017-12-17: qty 5

## 2017-12-17 MED ORDER — SODIUM CHLORIDE 0.9% FLUSH
10.0000 mL | INTRAVENOUS | Status: DC | PRN
  Administered 2017-12-17: 10 mL
  Filled 2017-12-17: qty 10

## 2017-12-17 NOTE — Patient Instructions (Signed)
Implanted Port Home Guide An implanted port is a type of central line that is placed under the skin. Central lines are used to provide IV access when treatment or nutrition needs to be given through a person's veins. Implanted ports are used for long-term IV access. An implanted port may be placed because:  You need IV medicine that would be irritating to the small veins in your hands or arms.  You need long-term IV medicines, such as antibiotics.  You need IV nutrition for a long period.  You need frequent blood draws for lab tests.  You need dialysis.  Implanted ports are usually placed in the chest area, but they can also be placed in the upper arm, the abdomen, or the leg. An implanted port has two main parts:  Reservoir. The reservoir is round and will appear as a small, raised area under your skin. The reservoir is the part where a needle is inserted to give medicines or draw blood.  Catheter. The catheter is a thin, flexible tube that extends from the reservoir. The catheter is placed into a large vein. Medicine that is inserted into the reservoir goes into the catheter and then into the vein.  How will I care for my incision site? Do not get the incision site wet. Bathe or shower as directed by your health care provider. How is my port accessed? Special steps must be taken to access the port:  Before the port is accessed, a numbing cream can be placed on the skin. This helps numb the skin over the port site.  Your health care provider uses a sterile technique to access the port. ? Your health care provider must put on a mask and sterile gloves. ? The skin over your port is cleaned carefully with an antiseptic and allowed to dry. ? The port is gently pinched between sterile gloves, and a needle is inserted into the port.  Only "non-coring" port needles should be used to access the port. Once the port is accessed, a blood return should be checked. This helps ensure that the port  is in the vein and is not clogged.  If your port needs to remain accessed for a constant infusion, a clear (transparent) bandage will be placed over the needle site. The bandage and needle will need to be changed every week, or as directed by your health care provider.  Keep the bandage covering the needle clean and dry. Do not get it wet. Follow your health care provider's instructions on how to take a shower or bath while the port is accessed.  If your port does not need to stay accessed, no bandage is needed over the port.  What is flushing? Flushing helps keep the port from getting clogged. Follow your health care provider's instructions on how and when to flush the port. Ports are usually flushed with saline solution or a medicine called heparin. The need for flushing will depend on how the port is used.  If the port is used for intermittent medicines or blood draws, the port will need to be flushed: ? After medicines have been given. ? After blood has been drawn. ? As part of routine maintenance.  If a constant infusion is running, the port may not need to be flushed.  How long will my port stay implanted? The port can stay in for as long as your health care provider thinks it is needed. When it is time for the port to come out, surgery will be   done to remove it. The procedure is similar to the one performed when the port was put in. When should I seek immediate medical care? When you have an implanted port, you should seek immediate medical care if:  You notice a bad smell coming from the incision site.  You have swelling, redness, or drainage at the incision site.  You have more swelling or pain at the port site or the surrounding area.  You have a fever that is not controlled with medicine.  This information is not intended to replace advice given to you by your health care provider. Make sure you discuss any questions you have with your health care provider. Document  Released: 04/24/2005 Document Revised: 09/30/2015 Document Reviewed: 12/30/2012 Elsevier Interactive Patient Education  2017 Elsevier Inc.  

## 2017-12-18 ENCOUNTER — Ambulatory Visit (HOSPITAL_COMMUNITY)
Admission: RE | Admit: 2017-12-18 | Discharge: 2017-12-18 | Disposition: A | Discharge status: Home / Self Care | Payer: Medicare Other | Source: Ambulatory Visit | Attending: Internal Medicine | Admitting: Internal Medicine

## 2017-12-18 ENCOUNTER — Encounter (HOSPITAL_COMMUNITY): Payer: Self-pay

## 2017-12-18 DIAGNOSIS — I700 Atherosclerosis of aorta: Secondary | ICD-10-CM | POA: Diagnosis not present

## 2017-12-18 DIAGNOSIS — C3490 Malignant neoplasm of unspecified part of unspecified bronchus or lung: Secondary | ICD-10-CM | POA: Insufficient documentation

## 2017-12-18 MED ORDER — IOHEXOL 300 MG/ML  SOLN
75.0000 mL | Freq: Once | INTRAMUSCULAR | Status: AC | PRN
  Administered 2017-12-18: 75 mL via INTRAVENOUS

## 2017-12-19 ENCOUNTER — Encounter: Payer: Self-pay | Admitting: Internal Medicine

## 2017-12-19 ENCOUNTER — Telehealth: Payer: Self-pay

## 2017-12-19 ENCOUNTER — Inpatient Hospital Stay (HOSPITAL_BASED_OUTPATIENT_CLINIC_OR_DEPARTMENT_OTHER): Payer: Medicare Other | Admitting: Internal Medicine

## 2017-12-19 VITALS — BP 119/77 | HR 108 | Temp 99.1°F | Resp 18 | Ht 72.0 in | Wt 148.4 lb

## 2017-12-19 DIAGNOSIS — Z85118 Personal history of other malignant neoplasm of bronchus and lung: Secondary | ICD-10-CM

## 2017-12-19 DIAGNOSIS — Z720 Tobacco use: Secondary | ICD-10-CM | POA: Diagnosis not present

## 2017-12-19 DIAGNOSIS — F209 Schizophrenia, unspecified: Secondary | ICD-10-CM | POA: Diagnosis not present

## 2017-12-19 DIAGNOSIS — C3491 Malignant neoplasm of unspecified part of right bronchus or lung: Secondary | ICD-10-CM

## 2017-12-19 DIAGNOSIS — Z923 Personal history of irradiation: Secondary | ICD-10-CM

## 2017-12-19 DIAGNOSIS — Z9221 Personal history of antineoplastic chemotherapy: Secondary | ICD-10-CM

## 2017-12-19 DIAGNOSIS — C3490 Malignant neoplasm of unspecified part of unspecified bronchus or lung: Secondary | ICD-10-CM

## 2017-12-19 NOTE — Progress Notes (Signed)
[facility] Telephone:(336) [phone]   Fax:(336) [phone]  OFFICE PROGRESS NOTE  [name], FNP [address] 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  PRIOR THERAPY:  1) Course of concurrent chemoradiation with weekly carboplatin for AUC of 2 and paclitaxel 45 MG/M2. First dose 03/01/2015. He is status post 6 cycles. Last cycle was given 04/12/2015 with partial response. 2) Consolidation chemotherapy with reduced dose carboplatin for AUC of 5 and paclitaxel 175 MG/M2 every 3 weeks, status post 3 cycles.  CURRENT THERAPY: Observation.  INTERVAL HISTORY: Ivan Daniels 72 y.o. male returns to the clinic today for follow-up visit accompanied by his sister.  The patient is feeling fine today with no specific complaints except for mild fatigue and occasional dizzy spells with a changing position.  He denied having any current chest pain, shortness breath, cough or hemoptysis.  He denied having any fever or chills.  He has no nausea, vomiting, diarrhea or constipation.  He is currently on observation.  He had repeat CT scan of the chest performed recently and he is here for evaluation and discussion of his discuss results.   MEDICAL HISTORY: Past Medical History:  Diagnosis Date  . ALCOHOL ABUSE 11/12/2009   Qualifier: Diagnosis of  By: Hassell Done FNP, Tori Milks    . Allergy   . Anemia   . Anxiety   . BENIGN PROSTATIC HYPERTROPHY, HX OF 12/19/2006   Qualifier: Diagnosis of  By: Radene Ou MD, [place]    . Blood transfusion without reported diagnosis 2017  . Clotting disorder ([place])    takes PLAVIX  . Depression   . Emphysema of lung ([place])   . Full code status 02/10/2015  . GERD (gastroesophageal reflux disease)   . Hyperlipidemia   . Lung mass  01/25/2015   3.7 x 3.6 cm RUL spiculated lung mass with 2.0 cm right paratracheal lymph node suspicious for bronchogenic CA  . Non-small cell lung cancer ([place]) 01/27/15   non small-cell,squamous cell ca RUL  . Paranoid schizophrenia ([place]) 10/31/2009   Qualifier: Diagnosis of  By: Jorene Minors, [place]    . Primary spontaneous pneumothorax, left 01/25/2015  . TOBACCO ABUSE 05/24/2009   Qualifier: Diagnosis of  By: Hassell Done FNP, [place]      ALLERGIES:  is allergic to benztropine mesylate and chlorpromazine hcl.  MEDICATIONS:  Current Outpatient Medications  Medication Sig Dispense Refill  . ALPHAGAN P 0.1 % SOLN Place 1 drop into both eyes 3 (three) times daily.  6  . benztropine (COGENTIN) 0.5 MG tablet Take 0.5 mg by mouth at bedtime.     . chlorhexidine (PERIDEX) 0.12 % solution 1 mL by Mouth Rinse route as needed.   1  . clopidogrel (PLAVIX) 75 MG tablet Take 75 mg by mouth daily.     . ferrous fumarate (HEMOCYTE - 106 MG FE) 325 (106 FE) MG TABS tablet Take 1 tablet by mouth daily.     . haloperidol (HALDOL) 5 MG tablet Take 5 mg by mouth at bedtime.     . Multiple Vitamins-Minerals (MULTIVITAMIN WITH MINERALS) tablet Take 1 tablet by mouth daily.    . NONFORMULARY OR COMPOUNDED [place]  Combination Pain Cream -  Baclofen 2%, Doxepin 5%, Gabapentin 6%, Topiramate 2%, Pentoxifylline 3% Apply 1-2 grams to  affected area 3-4 times daily Qty. 120 gm 3 refills 1 each 3  . simvastatin (ZOCOR) 20 MG tablet Take 20 mg by mouth at bedtime.     Marland Kitchen umeclidinium-vilanterol (ANORO ELLIPTA) 62.5-25 MCG/INH AEPB Inhale 1 puff daily into the lungs. 1 each 11   Current Facility-Administered Medications  Medication Dose Route Frequency Provider Last Rate Last Dose  . 0.9 %  sodium chloride infusion  500 mL Intravenous Continuous Irene Shipper, MD      . 0.9 %  sodium chloride infusion  500 mL Intravenous Continuous Irene Shipper, MD      . 0.9 %  sodium chloride infusion  500 mL Intravenous  Continuous Irene Shipper, MD        SURGICAL HISTORY:  Past Surgical History:  Procedure Laterality Date  . APPENDECTOMY    . CHEST TUBE INSERTION Left   . COLONOSCOPY    . INSERTION CENTRAL VENOUS ACCESS DEVICE W/ SUBCUTANEOUS PORT Right   . LUNG BIOPSY Right 01/27/2015   Procedure: Right Upper Lobe Bronchus BIOPSY;  Surgeon: Grace Isaac, MD;  Location: [place];  Service: Thoracic;  Laterality: Right;  Marland Kitchen VIDEO BRONCHOSCOPY WITH ENDOBRONCHIAL ULTRASOUND N/A 01/27/2015   Procedure: VIDEO BRONCHOSCOPY WITH ENDOBRONCHIAL ULTRASOUND;  Surgeon: Grace Isaac, MD;  Location: [facility];  Service: Thoracic;  Laterality: N/A;    REVIEW OF SYSTEMS:  A comprehensive review of systems was negative except for: Constitutional: positive for fatigue   PHYSICAL EXAMINATION: General appearance: alert, cooperative and no distress Head: Normocephalic, without obvious abnormality, atraumatic Neck: no adenopathy, no JVD, supple, symmetrical, trachea midline and thyroid not enlarged, symmetric, no tenderness/mass/nodules Lymph nodes: Cervical, supraclavicular, and axillary nodes normal. Resp: clear to auscultation bilaterally Back: symmetric, no curvature. ROM normal. No CVA tenderness. Cardio: regular rate and rhythm, S1, S2 normal, no murmur, click, rub or gallop GI: soft, non-tender; bowel sounds normal; no masses,  no organomegaly Extremities: extremities normal, atraumatic, no cyanosis or edema  ECOG PERFORMANCE STATUS: 1 - Symptomatic but completely ambulatory  Blood pressure 119/77, pulse (!) 108, temperature 99.1 F (37.3 C), temperature source Oral, resp. rate 18, height 6' (1.829 m), weight 148 lb 6.4 oz (67.3 kg), SpO2 100 %.  LABORATORY DATA: Lab Results  Component Value Date   WBC 5.8 12/17/2017   HGB 12.0 (L) 12/17/2017   HCT 37.7 (L) 12/17/2017   MCV 86.9 12/17/2017   PLT 276 12/17/2017      Chemistry      Component Value Date/Time   NA 138 12/17/2017 0853   NA 140 02/08/2017  0822   K 4.0 12/17/2017 0853   K 4.5 02/08/2017 0822   CL 104 12/17/2017 0853   CO2 24 12/17/2017 0853   CO2 25 02/08/2017 0822   BUN 10 12/17/2017 0853   BUN 8.8 02/08/2017 0822   CREATININE 1.07 12/17/2017 0853   CREATININE 1.1 02/08/2017 0822      Component Value Date/Time   CALCIUM 9.1 12/17/2017 0853   CALCIUM 9.6 02/08/2017 0822   ALKPHOS 60 12/17/2017 0853   ALKPHOS 53 02/08/2017 0822   AST 19 12/17/2017 0853   AST 19 02/08/2017 0822   ALT 9 12/17/2017 0853   ALT 9 02/08/2017 0822   BILITOT 0.3 12/17/2017 0853   BILITOT <0.22 02/08/2017 0822       RADIOGRAPHIC STUDIES: Ct Chest W Contrast  Result Date: 12/18/2017 CLINICAL DATA:  Lung cancer. EXAM: CT CHEST WITH CONTRAST TECHNIQUE: Multidetector CT imaging of the chest  was performed during intravenous contrast administration. CONTRAST:  41mL OMNIPAQUE IOHEXOL 300 MG/ML  SOLN COMPARISON:  08/16/2017 FINDINGS: Cardiovascular: The heart size is normal. No substantial pericardial effusion. Coronary artery calcification is evident. Atherosclerotic calcification is noted in the wall of the thoracic aorta. Right Port-A-Cath tip positioned at the SVC/RA junction. Mediastinum/Nodes: No mediastinal lymphadenopathy. No left hilar lymphadenopathy. Confluent soft tissue attenuation in the upper right hilum and suprahilar region is stable. The esophagus has normal imaging features. There is no axillary lymphadenopathy. Lungs/Pleura: The central tracheobronchial airways are patent. Volume loss in the right hemithorax with right paramediastinal confluent lung opacity is stable, consistent with post treatment scarring. No new or suspicious pulmonary nodule or mass. Upper Abdomen: Small hypoattenuating lesions in the liver are stable in the interval. Musculoskeletal: No worrisome lytic or sclerotic osseous abnormality. IMPRESSION: 1. Stable exam. No new or progressive findings to suggest recurrent or metastatic disease. 2.  Aortic Atherosclerois  (ICD10-170.0) Electronically Signed   By: Misty Stanley M.D.   On: 12/18/2017 09:57    ASSESSMENT AND PLAN:  This is a very pleasant 72 years old African-American male with a stage IIIA non-small cell lung cancer, squamous cell carcinoma status post concurrent chemoradiation followed by consolidation chemotherapy. The patient has been in observation for more than 2 years.  He is feeling fine with no concerning complaints except for mild fatigue. Repeat CT scan of the chest performed recently showed no concerning findings for disease recurrence or progression. I recommended for the patient to continue on observation with repeat CT scan of the chest in 6 months. He was advised to call immediately if he has any concerning symptoms in the interval. The patient voices understanding of current disease status and treatment options and is in agreement with the current care plan. I spent 10 minutes counseling the patient face to face. The total time spent in the appointment was 15 minutes. Disclaimer: This note was dictated with voice recognition software. Similar sounding words can inadvertently be transcribed and may not be corrected upon review.

## 2017-12-19 NOTE — Telephone Encounter (Signed)
Printed avs and calender of upcoming appointment.per 8/14 los

## 2018-01-23 ENCOUNTER — Ambulatory Visit: Payer: Medicare Other | Admitting: Podiatry

## 2018-02-05 DIAGNOSIS — F209 Schizophrenia, unspecified: Secondary | ICD-10-CM | POA: Diagnosis not present

## 2018-02-09 DIAGNOSIS — Z23 Encounter for immunization: Secondary | ICD-10-CM | POA: Diagnosis not present

## 2018-02-12 ENCOUNTER — Encounter: Payer: Self-pay | Admitting: Podiatry

## 2018-02-12 ENCOUNTER — Ambulatory Visit (INDEPENDENT_AMBULATORY_CARE_PROVIDER_SITE_OTHER): Payer: Medicare Other | Admitting: Podiatry

## 2018-02-12 DIAGNOSIS — D689 Coagulation defect, unspecified: Secondary | ICD-10-CM | POA: Diagnosis not present

## 2018-02-12 DIAGNOSIS — I739 Peripheral vascular disease, unspecified: Secondary | ICD-10-CM | POA: Diagnosis not present

## 2018-02-12 DIAGNOSIS — S98139S Complete traumatic amputation of one unspecified lesser toe, sequela: Secondary | ICD-10-CM | POA: Diagnosis not present

## 2018-02-12 DIAGNOSIS — Q828 Other specified congenital malformations of skin: Secondary | ICD-10-CM

## 2018-02-12 NOTE — Progress Notes (Signed)
Subjective:     Patient ID: Ivan Daniels, male   DOB: June 30, 1945, 72 y.o.   MRN: [id number]  HPIThis patient presents to my office with painful areas on the bottom of both feet.  He says he lost his toes to frostbite and now develops callused areas on the bottom of both feet. Patient is taking plavix.   Review of Systems     Objective:   Physical Exam Objective: Review of past medical history, medications, social history and allergies were performed.  Vascular: Dorsalis pedis and posterior tibial pulses were palpable B/L, capillary refill was  WNL B/L, temperature gradient was WNL B/L   Skin:  No signs of symptoms of infection or ulcers on both feet.  Porokeratosis sub 2 right and at site of toe amputation left foot.    Sensory: Thornell Mule monifilament WNL   Orthopedic: Orthopedic evaluation demonstrates all joints distal t ankle have full ROM without crepitus, muscle power WNL B/L    Assessment:     Porokeratosis B/L     Plan:    Debridement of porokeratosis RTC 10 weeks .  Told to use pumice stone. Patient to follow up with [facility].  Gardiner Barefoot DPM

## 2018-03-18 ENCOUNTER — Ambulatory Visit: Payer: Medicare Other | Admitting: Internal Medicine

## 2018-03-25 DIAGNOSIS — N400 Enlarged prostate without lower urinary tract symptoms: Secondary | ICD-10-CM | POA: Diagnosis not present

## 2018-04-01 ENCOUNTER — Ambulatory Visit (INDEPENDENT_AMBULATORY_CARE_PROVIDER_SITE_OTHER): Payer: Medicare Other | Admitting: Internal Medicine

## 2018-04-01 ENCOUNTER — Encounter: Payer: Self-pay | Admitting: Internal Medicine

## 2018-04-01 DIAGNOSIS — J449 Chronic obstructive pulmonary disease, unspecified: Secondary | ICD-10-CM | POA: Diagnosis not present

## 2018-04-01 MED ORDER — UMECLIDINIUM-VILANTEROL 62.5-25 MCG/INH IN AEPB: 1.0000 | INHALATION_SPRAY | Freq: Every day | RESPIRATORY_TRACT | 0 refills | Status: DC

## 2018-04-01 MED ORDER — UMECLIDINIUM-VILANTEROL 62.5-25 MCG/INH IN AEPB
1.0000 | INHALATION_SPRAY | Freq: Every day | RESPIRATORY_TRACT | 11 refills | Status: DC
Start: 2018-04-01 — End: 2019-04-11

## 2018-04-01 NOTE — Progress Notes (Signed)
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  PRIOR THERAPY:  1) Course of concurrent chemoradiation with weekly carboplatin for AUC of 2 and paclitaxel 45 MG/M2. First dose 03/01/2015. He is status post 6 cycles. Last cycle was given 04/12/2015 with partial response. 2) Consolidation chemotherapy with reduced dose carboplatin for AUC of 5 and paclitaxel 175 MG/M2 every 3 weeks, status post 3 cycles.  CURRENT THERAPY: Observation   History of Present Illness 04/01/2018  Transition of care/ Ivan Daniels  GOLD II/ anoro maint rx  Chief Complaint  Patient presents with  . Follow-up    Former patient of Dr Ashok Cordia. He states breathing is "alright".  He is using anoro and does not have a rescue inhaler. Spouse reports he coughs in the night.   Dyspnea:  [employer] slow pace/ pushing buggy/ HC parking/ landing s stopping MMRC3 = can't walk 100 yards even at a slow pace at a flat grade s stopping due to sob   Cough: dry / cough does not bother him or wake him at night  Sleeping: 1-2 pillows, bed flat  SABA use: none 02: none    No obvious day to day or daytime variability or assoc excess/ purulent sputum or mucus plugs or hemoptysis or cp or chest tightness, subjective wheeze or overt sinus or hb symptoms.   Sleeping  without nocturnal  or early am exacerbation  of respiratory  c/o's or need for noct saba. Also denies any obvious fluctuation of symptoms with weather or environmental changes or other aggravating or alleviating factors  except as outlined above   No unusual exposure hx or h/o childhood pna/ asthma or knowledge of premature birth.  Current Allergies, Complete Past Medical History, Past Surgical History, Family History, and Social History were reviewed in [employer] record.  ROS  The following are not active complaints unless bolded Hoarseness, sore throat, dysphagia, dental problems, itching, sneezing,  nasal congestion or discharge of excess mucus or purulent secretions, ear ache,   fever, chills, sweats, unintended wt loss or wt gain, classically pleuritic or exertional cp,  orthopnea pnd or arm/hand swelling  or leg swelling, presyncope, palpitations, abdominal pain, anorexia, nausea, vomiting, diarrhea  or change in bowel habits or change in bladder habits, change in stools or change in urine, dysuria, hematuria,  rash, arthralgias, visual complaints, headache, numbness, weakness or ataxia or problems with walking or coordination,  change in mood or  memory.        Current Meds  Medication Sig  . ALPHAGAN P 0.1 % SOLN Place 1 drop into both eyes 3 (three) times daily.  . benztropine (COGENTIN) 0.5 MG tablet Take 0.5 mg by mouth at bedtime.   . chlorhexidine (PERIDEX) 0.12 % solution 1 mL by Mouth Rinse route as needed.   . clopidogrel (PLAVIX) 75 MG tablet Take 75 mg by mouth daily.   . ferrous fumarate (HEMOCYTE - 106 MG FE) 325 (106 FE) MG TABS tablet Take 1 tablet by mouth  daily.   . haloperidol (HALDOL) 5 MG tablet Take 5 mg by mouth at bedtime.   . Multiple Vitamins-Minerals (MULTIVITAMIN WITH MINERALS) tablet Take 1 tablet by mouth daily.  . NONFORMULARY OR COMPOUNDED [place]  Combination Pain Cream -  Baclofen 2%, Doxepin 5%, Gabapentin 6%, Topiramate 2%, Pentoxifylline 3% Apply 1-2 grams to affected area 3-4 times daily Qty. 120 gm 3 refills  . simvastatin (ZOCOR) 20 MG tablet Take 20 mg by mouth at bedtime.   Marland Kitchen umeclidinium-vilanterol (ANORO ELLIPTA)  62.5-25 MCG/INH AEPB Inhale 1 puff into the lungs daily.  .                       Objective:   Physical Exam.   Elderly stoic bm nad   Wt Readings from Last 3 Encounters:  04/01/18 151 lb 12.8 oz (68.9 kg)  12/19/17 148 lb 6.4 oz (67.3 kg)  12/01/17 152 lb (68.9 kg)     Vital signs reviewed - Note on arrival 02 sats 96 % on RA    HEENT: Edentulous/  Nl  oropharynx. Nl external ear canals without cough reflex -  Mild  bilateral non-specific turbinate edema     NECK :  without JVD/Nodes/TM/ nl carotid upstrokes bilaterally   LUNGS: no acc muscle use,  Mild barrel  contour chest wall with bilateral  Distant bs s audible wheeze and  without cough on insp or exp maneuver and mild  Hyperresonant  to  percussion bilaterally     CV:  RRR  no s3 or murmur or increase in P2, and no edema   ABD:  soft and nontender with pos late insp Hoover's  in the supine position. No bruits or organomegaly appreciated, bowel sounds nl  MS:   Nl gait/  ext warm without deformities, calf tenderness, cyanosis or clubbing No obvious joint restrictions   SKIN: warm and dry without lesions    NEURO:  alert, approp, nl sensorium with  no motor or cerebellar deficits apparent.         Studies reviewed by me for transition of care ov 04/01/2018  PFT 06/13/16: FVC 2.74 L (68%) FEV1 1.75 (57%) FEV1/FVC is 0.64 FEF 25-75 1.00) 3%) negative bronchodilator response TLC 4.44 L (61%) RV 76% ERV 122% DLCO corrected 38%  6MWT 06/15/16:  Walked 222 meters / Baseline Sat 98% on RA / Nadir Sat 97% on RA 2 minutes after walk  IMAGING CT CHEST W/ CONTRAST 02/08/17 (personally reviewed by me):  Right perihilar and upper lobe opacity with consolidation consistent with postradiation fibrosis. This is not significantly changed with comparison to prior CT scans. No new nodule or opacity appreciated. No pleural effusion or thickening. No pericardial effusion. No pathologic mediastinal adenopathy. Volume loss on  the right appears chronic with fibrotic changes.   ESOPHAGRAM/BARIUM SWALLOW 05/19/16  :  Smoothly tapered stricture of the proximal esophagus at the level of the thoracic inlet preventing passage of barium tablet. Moderate esophageal dysmotility.  CT CHEST W/ CONTRAST 02/28/16 Atypical predominant emphysema & subpleural bleb formation. Right hilar consolidation suggesting fibrotic change with continued stability. Hyperexpansion of left lung volume loss on the right. No new nodule or opacity appreciated. No pathologic mediastinal adenopathy. No pleural effusion or thickening. No pericardial effusion.  PATHOLOGY Right Upper Lobe Bronchial Brush (01/27/15):  Squamous Cell Carcinoma Right Upper Lobe Endobronchial Biopsy (01/27/15):  Squamous Cell Carcinoma EBUS Level 7 FNA (01/27/15):  Squamous Cell Carcinoma   LABS 05/12/16 Alpha-1 antitrypsin:  MM (151)    Assessment & Plan:

## 2018-04-01 NOTE — Patient Instructions (Signed)
Please schedule a follow up visit in  6 months but call sooner if needed with pfts on return

## 2018-04-04 ENCOUNTER — Encounter: Payer: Self-pay | Admitting: Internal Medicine

## 2018-04-04 NOTE — Assessment & Plan Note (Addendum)
Quit smoking 01/2016 PFT  06/13/16:   FEV1 1.75 (57%) FEV1/FVC is 0.64 F  negative bronchodilator response ? On prior rx    DLCO corrected 38%  - 04/01/2018  After extensive coaching inhaler device,  effectiveness =    90% with elipta from baseline of 50%  - 04/01/2018   Walked RA  2 laps @ 210  ft each stopped due to  End of study, denies sob at slow pace and no desat  Clearly Pt is Group B in terms of symptom/risk and laba/lama therefore appropriate rx at this point so anoro reasonable choice though note the poor baseline elipta technique and using it more effectively may help or paradoxically worsen the cough depending on whether upper or lower in nature and if it bothers the pt enough to want to pursue further (he assures me that's not he case though wife not sure) then we can see him back to focus on this    O/w f/u is q 6 m with f/u in meantime by Dr Earlie Server planned   I had an extended discussion with the patient reviewing all relevant studies completed to date (which are extensive) and  lasting 25 minutes of a 40  minute transition of care  office visit with pt new to me  re  severe non-specific but potentially very serious refractory respiratory symptoms of uncertain and potentially multiple  Etiologies.  See directly observed ambulatory 02 saturation study with results above as well as device teaching which extended face to face time for this visit   Each maintenance medication was reviewed in detail including most importantly the difference between maintenance and prns and under what circumstances the prns are to be triggered using an action plan format that is not reflected in the computer generated alphabetically organized AVS.    Please see AVS for specific instructions unique to this office visit that I personally wrote and verbalized to the the pt in detail and then reviewed with pt  by my nurse highlighting any changes in therapy/plan of care  recommended at today's visit.

## 2018-04-23 ENCOUNTER — Ambulatory Visit (INDEPENDENT_AMBULATORY_CARE_PROVIDER_SITE_OTHER): Payer: Medicare Other | Admitting: Podiatry

## 2018-04-23 ENCOUNTER — Encounter: Payer: Self-pay | Admitting: Podiatry

## 2018-04-23 DIAGNOSIS — F209 Schizophrenia, unspecified: Secondary | ICD-10-CM | POA: Diagnosis not present

## 2018-04-23 DIAGNOSIS — Z9229 Personal history of other drug therapy: Secondary | ICD-10-CM

## 2018-04-23 DIAGNOSIS — Q828 Other specified congenital malformations of skin: Secondary | ICD-10-CM | POA: Diagnosis not present

## 2018-04-23 DIAGNOSIS — D689 Coagulation defect, unspecified: Secondary | ICD-10-CM | POA: Diagnosis not present

## 2018-04-23 NOTE — Patient Instructions (Signed)

## 2018-04-23 NOTE — Progress Notes (Signed)
Subjective: Ivan Daniels is a 72 y.o. y.o. male who presents today for at-risk footcare. 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 He is here for painful porokeratoses of both feet aggravated with weightbearing with and without shoe gear.   Pain is relieved with periodic professional debridement.  Location of lesions:  Porokeratosis submet head 2 right foot, submet head 4 left foot and distal TMA stump.  He is also on blood thinner, Plavix.  [name], FNP is his PCP.   Current Outpatient Medications:  .  ALPHAGAN P 0.1 % SOLN, Place 1 drop into both eyes 3 (three) times daily., Disp: , Rfl: 6 .  benztropine (COGENTIN) 0.5 MG tablet, Take 0.5 mg by mouth at bedtime. , Disp: , Rfl:  .  chlorhexidine (PERIDEX) 0.12 % solution, 1 mL by Mouth Rinse route as needed. , Disp: , Rfl: 1 .  clopidogrel (PLAVIX) 75 MG tablet, Take 75 mg by mouth daily. , Disp: , Rfl:  .  ferrous fumarate (HEMOCYTE - 106 MG FE) 325 (106 FE) MG TABS tablet, Take 1 tablet by mouth daily. , Disp: , Rfl:  .  haloperidol (HALDOL) 5 MG tablet, Take 5 mg by mouth at bedtime. , Disp: , Rfl:  .  Multiple Vitamins-Minerals (MULTIVITAMIN WITH MINERALS) tablet, Take 1 tablet by mouth daily., Disp: , Rfl:  .  NONFORMULARY OR COMPOUNDED ITEM, [facility]  Combination Pain Cream -  Baclofen 2%, Doxepin 5%, Gabapentin 6%, Topiramate 2%, Pentoxifylline 3% Apply 1-2 grams to affected area 3-4 times daily Qty. 120 gm 3 refills, Disp: 1 each, Rfl: 3 .  simvastatin (ZOCOR) 20 MG tablet, Take 20 mg by mouth at bedtime. , Disp: , Rfl:  .  umeclidinium-vilanterol (ANORO ELLIPTA) 62.5-25 MCG/INH AEPB, Inhale 1 puff into the lungs daily., Disp: 1 each, Rfl: 11  Current Facility-Administered Medications:  .  0.9 %  sodium chloride infusion, 500 mL, Intravenous, Continuous, Irene Shipper, MD .  0.9 %  sodium chloride infusion, 500 mL, Intravenous, Continuous, Irene Shipper, MD .  0.9 %  sodium chloride infusion,  500 mL, Intravenous, Continuous, Irene Shipper, MD   Allergies  Allergen Reactions  . Benztropine Mesylate     REACTION: decrease ROM neck. Pt states he is not allergic   . Chlorpromazine Hcl     Unknown/ per pt causes him to draw up     Objective: Vascular Examination: Vascular Examination: Capillary refill time immediate <3 seconds to b/l stumps; immediate to right 5th digit Dorsalis pedis and Posterior tibial pulses present b/l Digital hair absent right 5th digit Skin temperature gradient WNL b/l  Dermatological Examination: Skin with normal turgor, texture and tone b/l  Toenails 1-5 b/l discolored, thick, dystrophic with subungual debris and pain with palpation to nailbeds due to thickness of nails.  Porokeratotic lesion submet head 2 right foot, submet head 4 left foot. There is no edema, no erythema, no drainage, no flocculence. No impending wound formation.  Hyperkeratotic lesion distal incision site of TMA left foot. There is no edema, no erythema, no drainage, no flocculence. No impending wound formation.   Musculoskeletal: Muscle strength 5/5 to all remaining BLE muscle groups No pain/crepitus remaining pedal joints b/l TMA amputation left foot Amputation of digits 1-4 right foot  Neurological: Sensation intact with 10 gram monofilament.   Assessment: 1. Painful onychomycosis toenails 1-5 b/l in patient on blood thinner. 2. Amputation status left TMA 3. 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 Patient on long  term blood thinner   Plan: 1. Porokeratotic lesions pared b/l feet  utilizing sterile blades: chisel blade and 15 blade without incident:  submet head 2 right foot, submet head 4 left foot,  distal incision site of TMA left foot. 2. Patient to continue soft, supportive shoe gear 3. Patient to report any pedal injuries to medical professional immediately. 4. Avoid self trimming due to use of blood thinner. 5. Follow up 3 months. Patient/POA to call should  there be a concern in the interim.

## 2018-05-03 DIAGNOSIS — H9193 Unspecified hearing loss, bilateral: Secondary | ICD-10-CM | POA: Diagnosis not present

## 2018-05-03 DIAGNOSIS — H6123 Impacted cerumen, bilateral: Secondary | ICD-10-CM | POA: Diagnosis not present

## 2018-06-03 DIAGNOSIS — I10 Essential (primary) hypertension: Secondary | ICD-10-CM | POA: Diagnosis not present

## 2018-06-03 DIAGNOSIS — Z0000 Encounter for general adult medical examination without abnormal findings: Secondary | ICD-10-CM | POA: Diagnosis not present

## 2018-06-03 DIAGNOSIS — Z125 Encounter for screening for malignant neoplasm of prostate: Secondary | ICD-10-CM | POA: Diagnosis not present

## 2018-06-03 DIAGNOSIS — Z79899 Other long term (current) drug therapy: Secondary | ICD-10-CM | POA: Diagnosis not present

## 2018-06-03 DIAGNOSIS — E785 Hyperlipidemia, unspecified: Secondary | ICD-10-CM | POA: Diagnosis not present

## 2018-06-17 ENCOUNTER — Ambulatory Visit (HOSPITAL_COMMUNITY)
Admission: RE | Admit: 2018-06-17 | Discharge: 2018-06-17 | Disposition: A | Discharge status: Home / Self Care | Payer: Medicare Other | Source: Ambulatory Visit | Attending: Internal Medicine | Admitting: Internal Medicine

## 2018-06-17 ENCOUNTER — Inpatient Hospital Stay: Payer: Medicare Other | Attending: Internal Medicine

## 2018-06-17 ENCOUNTER — Inpatient Hospital Stay: Payer: Medicare Other

## 2018-06-17 DIAGNOSIS — C3490 Malignant neoplasm of unspecified part of unspecified bronchus or lung: Secondary | ICD-10-CM | POA: Insufficient documentation

## 2018-06-17 DIAGNOSIS — R918 Other nonspecific abnormal finding of lung field: Secondary | ICD-10-CM | POA: Diagnosis not present

## 2018-06-17 DIAGNOSIS — Z9221 Personal history of antineoplastic chemotherapy: Secondary | ICD-10-CM | POA: Insufficient documentation

## 2018-06-17 DIAGNOSIS — Z923 Personal history of irradiation: Secondary | ICD-10-CM | POA: Diagnosis not present

## 2018-06-17 DIAGNOSIS — Z85118 Personal history of other malignant neoplasm of bronchus and lung: Secondary | ICD-10-CM | POA: Diagnosis not present

## 2018-06-17 DIAGNOSIS — J449 Chronic obstructive pulmonary disease, unspecified: Secondary | ICD-10-CM

## 2018-06-17 LAB — CMP (CANCER CENTER ONLY)
ALT: 8 U/L (ref 0–44)
AST: 20 U/L (ref 15–41)
Albumin: 3.7 g/dL (ref 3.5–5.0)
Alkaline Phosphatase: 57 U/L (ref 38–126)
Anion gap: 10 (ref 5–15)
BUN: 10 mg/dL (ref 8–23)
CO2: 27 mmol/L (ref 22–32)
Calcium: 9.3 mg/dL (ref 8.9–10.3)
Chloride: 104 mmol/L (ref 98–111)
Creatinine: 1.09 mg/dL (ref 0.61–1.24)
GFR, Est AFR Am: >60 mL/min (ref >60)
GFR, Estimated: >60 mL/min (ref >60)
Glucose, Bld: 93 mg/dL (ref 70–99)
Potassium: 4.1 mmol/L (ref 3.5–5.1)
Sodium: 141 mmol/L (ref 135–145)
Total Bilirubin: 0.4 mg/dL (ref 0.3–1.2)
Total Protein: 7.6 g/dL (ref 6.5–8.1)

## 2018-06-17 LAB — CBC WITH DIFFERENTIAL (CANCER CENTER ONLY)
Abs Immature Granulocytes: 0.01 10*3/uL (ref 0.00–0.07)
Basophils Absolute: 0 10*3/uL (ref 0.0–0.1)
Basophils Relative: 0 %
Eosinophils Absolute: 0 10*3/uL (ref 0.0–0.5)
Eosinophils Relative: 1 %
HCT: 36.6 % — ABNORMAL LOW (ref 39.0–52.0)
Hemoglobin: 11.4 g/dL — ABNORMAL LOW (ref 13.0–17.0)
Immature Granulocytes: 0 %
Lymphocytes Relative: 25 %
Lymphs Abs: 1.3 10*3/uL (ref 0.7–4.0)
MCH: 27 pg (ref 26.0–34.0)
MCHC: 31.1 g/dL (ref 30.0–36.0)
MCV: 86.5 fL (ref 80.0–100.0)
Monocytes Absolute: 0.4 10*3/uL (ref 0.1–1.0)
Monocytes Relative: 8 %
Neutro Abs: 3.4 10*3/uL (ref 1.7–7.7)
Neutrophils Relative %: 66 %
Platelet Count: 255 10*3/uL (ref 150–400)
RBC: 4.23 MIL/uL (ref 4.22–5.81)
RDW: 17.3 % — ABNORMAL HIGH (ref 11.5–15.5)
WBC Count: 5.1 10*3/uL (ref 4.0–10.5)
nRBC: 0 % (ref 0.0–0.2)

## 2018-06-17 MED ORDER — HEPARIN SOD (PORK) LOCK FLUSH 100 UNIT/ML IV SOLN
500.0000 [IU] | Freq: Once | INTRAVENOUS | Status: AC
  Administered 2018-06-17: 500 [IU] via INTRAVENOUS

## 2018-06-17 MED ORDER — HEPARIN SOD (PORK) LOCK FLUSH 100 UNIT/ML IV SOLN
INTRAVENOUS | Status: AC
  Filled 2018-06-17: qty 5

## 2018-06-17 MED ORDER — SODIUM CHLORIDE 0.9% FLUSH
10.0000 mL | INTRAVENOUS | Status: DC | PRN
  Administered 2018-06-17: 10 mL
  Filled 2018-06-17: qty 10

## 2018-06-17 MED ORDER — IOHEXOL 300 MG/ML  SOLN
75.0000 mL | Freq: Once | INTRAMUSCULAR | Status: AC | PRN
  Administered 2018-06-17: 75 mL via INTRAVENOUS

## 2018-06-17 MED ORDER — SODIUM CHLORIDE (PF) 0.9 % IJ SOLN
INTRAMUSCULAR | Status: AC
  Filled 2018-06-17: qty 50

## 2018-06-19 ENCOUNTER — Telehealth: Payer: Self-pay | Admitting: Internal Medicine

## 2018-06-19 ENCOUNTER — Other Ambulatory Visit: Payer: Self-pay | Admitting: *Deleted

## 2018-06-19 ENCOUNTER — Inpatient Hospital Stay (HOSPITAL_BASED_OUTPATIENT_CLINIC_OR_DEPARTMENT_OTHER): Payer: Medicare Other | Admitting: Internal Medicine

## 2018-06-19 ENCOUNTER — Inpatient Hospital Stay: Payer: Medicare Other

## 2018-06-19 ENCOUNTER — Encounter: Payer: Self-pay | Admitting: *Deleted

## 2018-06-19 ENCOUNTER — Encounter: Payer: Self-pay | Admitting: Internal Medicine

## 2018-06-19 VITALS — BP 127/86 | HR 115 | Temp 98.2°F | Resp 18 | Ht 73.0 in | Wt 152.5 lb

## 2018-06-19 DIAGNOSIS — Z923 Personal history of irradiation: Secondary | ICD-10-CM | POA: Diagnosis not present

## 2018-06-19 DIAGNOSIS — R918 Other nonspecific abnormal finding of lung field: Secondary | ICD-10-CM | POA: Diagnosis not present

## 2018-06-19 DIAGNOSIS — C3490 Malignant neoplasm of unspecified part of unspecified bronchus or lung: Secondary | ICD-10-CM

## 2018-06-19 DIAGNOSIS — Z85118 Personal history of other malignant neoplasm of bronchus and lung: Secondary | ICD-10-CM | POA: Diagnosis not present

## 2018-06-19 DIAGNOSIS — Z9221 Personal history of antineoplastic chemotherapy: Secondary | ICD-10-CM | POA: Diagnosis not present

## 2018-06-19 DIAGNOSIS — C3491 Malignant neoplasm of unspecified part of right bronchus or lung: Secondary | ICD-10-CM

## 2018-06-19 DIAGNOSIS — C3411 Malignant neoplasm of upper lobe, right bronchus or lung: Secondary | ICD-10-CM

## 2018-06-19 DIAGNOSIS — Z006 Encounter for examination for normal comparison and control in clinical research program: Secondary | ICD-10-CM

## 2018-06-19 DIAGNOSIS — R911 Solitary pulmonary nodule: Secondary | ICD-10-CM | POA: Diagnosis not present

## 2018-06-19 LAB — RESEARCH LABS

## 2018-06-19 NOTE — Research (Signed)
Human Biospecimens for the Discovery and Validation of Biomarkers for the Prediction, Diagnosis and Management of Disease  ADX01 - [id number] PI: Bobbye Riggs, MD Attending:  Visit: Consent  X Study was introduced by the attending physician.  X Patient had the opportunity to ask questions.  X Consenting personnel has reviewed consent in entirety with patient.  Discussion included, but not limited to: protocol expectations/evaluations, side effects, risks and benefits of the procedures associated with the study.  X Patient verbalizes good understanding of all instructions and information in consent, including the voluntary nature of participation, and agrees to comply with all protocol requirements.  X The patient wishes to participate in this clinical trial and willingly signed the consent document.  X Copy of signed consent was given to the patient.  The original was scanned to medial records and the original will be placed in the subject's research chart.  X No study-specific procedures were done prior to consent.  Any study specific tests/procedures will now be scheduled and completed per protocol for screening purposes.  X  The patient is aware that their insurance will not be billed as this is not a pharmaceutical trial and we are usually just collecting labs at an already scheduled lab visit.    X Patient signed the ICF (IRB approved 02/16/2017), HIPPA (IRB approved 11/24/2016) forms on 06/19/2018 at 10:10 am  X The patient was provided with the contact information for the PI and the IRB if they have any research related questions  X The PI was notified that the patient consented to the study   Patient provided blood on 06/19/2018 to complete study enrollment.  A VISA debit card was provided as specified by the protocol. Farris Has [facility] 06/19/18

## 2018-06-19 NOTE — Telephone Encounter (Signed)
Scheduled appt per 2/12 los.  Added a lab per research Sharlett Iles) request.  Printed calendar and avs per patient request.

## 2018-06-19 NOTE — Progress Notes (Signed)
[place] [phone]   [phone]  OFFICE PROGRESS NOTE  [name], FNP [address] [place] [address]  DIAGNOSIS: Stage IIIA (T2a, N2, M0) non-small cell lung cancer consistent with invasive squamous cell carcinoma presented with right upper lobe lung mass in addition to right hilar and mediastinal lymphadenopathy diagnosed in September 2016.  PRIOR THERAPY:  1) Course of concurrent chemoradiation with weekly carboplatin for AUC of 2 and paclitaxel 45 MG/M2. First dose 03/01/2015. He is status post 6 cycles. Last cycle was given 04/12/2015 with partial response. 2) Consolidation chemotherapy with reduced dose carboplatin for AUC of 5 and paclitaxel 175 MG/M2 every 3 weeks, status post 3 cycles.  CURRENT THERAPY: Observation.  INTERVAL HISTORY: Ivan Daniels 73 y.o. male returns to the clinic today for 6 months follow-up visit accompanied by his sister.  The patient is feeling fine today with no concerning complaints.  He denied having any recent chest pain, shortness of breath, cough or hemoptysis.  He has no recent weight loss or night sweats.  He has no nausea, vomiting, diarrhea or constipation.  He had repeat CT scan of the chest performed recently and he is here for evaluation and discussion of his scan results.   MEDICAL HISTORY: Past Medical History:  Diagnosis Date  . ALCOHOL ABUSE 11/12/2009   Qualifier: Diagnosis of  By: Hassell Done FNP, Tori Milks    . Allergy   . Anemia   . Anxiety   . BENIGN PROSTATIC HYPERTROPHY, HX OF 12/19/2006   Qualifier: Diagnosis of  By: Radene Ou MD, [place]    . Blood transfusion without reported diagnosis 2017  . Clotting disorder ([place])    takes PLAVIX  . Depression   . Emphysema of lung ([place])   . Full code status 02/10/2015  . GERD (gastroesophageal reflux disease)   . Hyperlipidemia   . Lung mass 01/25/2015   3.7 x 3.6 cm RUL spiculated lung mass with 2.0 cm right paratracheal  lymph node suspicious for bronchogenic CA  . Non-small cell lung cancer ([place]) 01/27/15   non small-cell,squamous cell ca RUL  . Paranoid schizophrenia ([place]) 10/31/2009   Qualifier: Diagnosis of  By: Jorene Minors, [place]    . Primary spontaneous pneumothorax, left 01/25/2015  . TOBACCO ABUSE 05/24/2009   Qualifier: Diagnosis of  By: Hassell Done FNP, [place]      ALLERGIES:  is allergic to benztropine mesylate and chlorpromazine hcl.  MEDICATIONS:  Current Outpatient Medications  Medication Sig Dispense Refill  . ALPHAGAN P 0.1 % SOLN Place 1 drop into both eyes 3 (three) times daily.  6  . benztropine (COGENTIN) 0.5 MG tablet Take 0.5 mg by mouth at bedtime.     . chlorhexidine (PERIDEX) 0.12 % solution 1 mL by Mouth Rinse route as needed.   1  . clopidogrel (PLAVIX) 75 MG tablet Take 75 mg by mouth daily.     . ferrous fumarate (HEMOCYTE - 106 MG FE) 325 (106 FE) MG TABS tablet Take 1 tablet by mouth daily.     . haloperidol (HALDOL) 5 MG tablet Take 5 mg by mouth at bedtime.     . Multiple Vitamins-Minerals (MULTIVITAMIN WITH MINERALS) tablet Take 1 tablet by mouth daily.    . NONFORMULARY OR COMPOUNDED [place]  Combination Pain Cream -  Baclofen 2%, Doxepin 5%, Gabapentin 6%, Topiramate 2%, Pentoxifylline 3% Apply 1-2 grams to affected area 3-4 times daily Qty. 120 gm 3 refills 1 each 3  .  simvastatin (ZOCOR) 20 MG tablet Take 20 mg by mouth at bedtime.     Marland Kitchen umeclidinium-vilanterol (ANORO ELLIPTA) 62.5-25 MCG/INH AEPB Inhale 1 puff into the lungs daily. 1 each 11   Current Facility-Administered Medications  Medication Dose Route Frequency Provider Last Rate Last Dose  . 0.9 %  sodium chloride infusion  500 mL Intravenous Continuous Irene Shipper, MD      . 0.9 %  sodium chloride infusion  500 mL Intravenous Continuous Irene Shipper, MD      . 0.9 %  sodium chloride infusion  500 mL Intravenous Continuous Irene Shipper, MD        SURGICAL HISTORY:  Past Surgical History:   Procedure Laterality Date  . APPENDECTOMY    . CHEST TUBE INSERTION Left   . COLONOSCOPY    . INSERTION CENTRAL VENOUS ACCESS DEVICE W/ SUBCUTANEOUS PORT Right   . LUNG BIOPSY Right 01/27/2015   Procedure: Right Upper Lobe Bronchus BIOPSY;  Surgeon: Grace Isaac, MD;  Location: [facility];  Service: Thoracic;  Laterality: Right;  Marland Kitchen VIDEO BRONCHOSCOPY WITH ENDOBRONCHIAL ULTRASOUND N/A 01/27/2015   Procedure: VIDEO BRONCHOSCOPY WITH ENDOBRONCHIAL ULTRASOUND;  Surgeon: Grace Isaac, MD;  Location: [facility] OR;  Service: Thoracic;  Laterality: N/A;    REVIEW OF SYSTEMS:  A comprehensive review of systems was negative.   PHYSICAL EXAMINATION: General appearance: alert, cooperative and no distress Head: Normocephalic, without obvious abnormality, atraumatic Neck: no adenopathy, no JVD, supple, symmetrical, trachea midline and thyroid not enlarged, symmetric, no tenderness/mass/nodules Lymph nodes: Cervical, supraclavicular, and axillary nodes normal. Resp: clear to auscultation bilaterally Back: symmetric, no curvature. ROM normal. No CVA tenderness. Cardio: regular rate and rhythm, S1, S2 normal, no murmur, click, rub or gallop GI: soft, non-tender; bowel sounds normal; no masses,  no organomegaly Extremities: extremities normal, atraumatic, no cyanosis or edema  ECOG PERFORMANCE STATUS: 0 - Asymptomatic  Blood pressure 127/86, pulse (!) 115, temperature 98.2 F (36.8 C), temperature source Oral, resp. rate 18, height 6\' 1"  (1.854 m), weight 152 lb 8 oz (69.2 kg), SpO2 100 %.  LABORATORY DATA: Lab Results  Component Value Date   WBC 5.1 06/17/2018   HGB 11.4 (L) 06/17/2018   HCT 36.6 (L) 06/17/2018   MCV 86.5 06/17/2018   PLT 255 06/17/2018      Chemistry      Component Value Date/Time   NA 141 06/17/2018 0801   NA 140 02/08/2017 0822   K 4.1 06/17/2018 0801   K 4.5 02/08/2017 0822   CL 104 06/17/2018 0801   CO2 27 06/17/2018 0801   CO2 25 02/08/2017 0822   BUN 10 06/17/2018  0801   BUN 8.8 02/08/2017 0822   CREATININE 1.09 06/17/2018 0801   CREATININE 1.1 02/08/2017 0822      Component Value Date/Time   CALCIUM 9.3 06/17/2018 0801   CALCIUM 9.6 02/08/2017 0822   ALKPHOS 57 06/17/2018 0801   ALKPHOS 53 02/08/2017 0822   AST 20 06/17/2018 0801   AST 19 02/08/2017 0822   ALT 8 06/17/2018 0801   ALT 9 02/08/2017 0822   BILITOT 0.4 06/17/2018 0801   BILITOT <0.22 02/08/2017 4098       RADIOGRAPHIC STUDIES: Ct Chest W Contrast  Result Date: 06/17/2018 CLINICAL DATA:  Lung cancer. EXAM: CT CHEST WITH CONTRAST TECHNIQUE: Multidetector CT imaging of the chest was performed during intravenous contrast administration. CONTRAST:  35mL OMNIPAQUE IOHEXOL 300 MG/ML  SOLN COMPARISON:  12/18/2017. FINDINGS: Cardiovascular: Right IJ Port-A-Cath  terminates in the high right atrium. Coronary artery calcification. Small amount of anterior pericardial fluid is unchanged. Mediastinum/Nodes: Low right paratracheal lymph node measures 9 mm, similar. No hilar or axillary adenopathy. Esophagus is grossly unremarkable. Lungs/Pleura: Bullous emphysema. A necrotic appearing lesion in the anterior right upper lobe measures approximately 2.9 x 2.9 cm (series 2, image 49), grossly stable. There is surrounding radiation related collapse/consolidation and bronchiectasis in the right upper lobe. 5 mm left upper lobe nodule (72), increased from 3 mm. No pleural fluid. Marked narrowing of the right upper lobe bronchus, as before. Airway is otherwise unremarkable. Upper Abdomen: Subcentimeter low-attenuation lesions in the left hepatic lobe are too small to characterize. Liver is otherwise unremarkable. Tiny stone is seen in the gallbladder. Adrenal glands are unremarkable. Right kidney is non rotated. Subcentimeter low-attenuation lesion in the right kidney is too small to characterize but statistically, a cyst is most likely. Visualized portions of the kidneys, spleen, pancreas, stomach and bowel are  otherwise grossly unremarkable. Calcified periportal lymph nodes. Musculoskeletal: No worrisome lytic or sclerotic lesions. IMPRESSION: 1. Necrotic-appearing right upper lobe nodule and surrounding post radiation changes in the right upper lobe are grossly stable. 2. Enlarging left upper lobe nodule, worrisome for malignancy. 3. Cholelithiasis. 4.  Emphysema (ICD10-J43.9). Electronically Signed   By: Lorin Picket M.D.   On: 06/17/2018 10:05    ASSESSMENT AND PLAN:  This is a very pleasant 73 years old African-American male with a stage IIIA non-small cell lung cancer, squamous cell carcinoma status post concurrent chemoradiation followed by consolidation chemotherapy. The patient has been in observation for close to 3 years. The patient is feeling fine with no concerning complaints. Repeat CT scan of the chest showed no concerning findings for slightly enlarging left upper lobe pulmonary nodule measuring 5 mm in size. I discussed the scan results with the patient and his sister and recommended for him to continue on observation and close monitoring for the enlarging left upper lobe pulmonary nodule. I will see him back for follow-up visit in 6 months for evaluation with repeat CT scan of the chest for restaging of his disease. The patient was advised to call immediately if he has any concerning symptoms in the interval. The patient voices understanding of current disease status and treatment options and is in agreement with the current care plan. I spent 10 minutes counseling the patient face to face. The total time spent in the appointment was 15 minutes. Disclaimer: This note was dictated with voice recognition software. Similar sounding words can inadvertently be transcribed and may not be corrected upon review.

## 2018-07-09 DIAGNOSIS — F209 Schizophrenia, unspecified: Secondary | ICD-10-CM | POA: Diagnosis not present

## 2018-07-23 ENCOUNTER — Ambulatory Visit: Payer: Medicare Other | Admitting: Podiatry

## 2018-07-24 ENCOUNTER — Ambulatory Visit (INDEPENDENT_AMBULATORY_CARE_PROVIDER_SITE_OTHER): Payer: Medicare Other | Admitting: Podiatry

## 2018-07-24 ENCOUNTER — Encounter: Payer: Self-pay | Admitting: Podiatry

## 2018-07-24 ENCOUNTER — Other Ambulatory Visit: Payer: Self-pay

## 2018-07-24 DIAGNOSIS — I739 Peripheral vascular disease, unspecified: Secondary | ICD-10-CM | POA: Diagnosis not present

## 2018-07-24 DIAGNOSIS — D689 Coagulation defect, unspecified: Secondary | ICD-10-CM

## 2018-07-24 DIAGNOSIS — Q828 Other specified congenital malformations of skin: Secondary | ICD-10-CM | POA: Diagnosis not present

## 2018-07-24 DIAGNOSIS — Z89421 Acquired absence of other right toe(s): Secondary | ICD-10-CM

## 2018-07-24 NOTE — Progress Notes (Signed)
This patient presents to the office for treatment of his calluses which have developed on both feet following frostbite. He says these calluses are painful walking and wearing his shoes. He presents to the office for continued preventative foot care services. Patient is taking plavix.  Vascular  Dorsalis pedis and posterior tibial pulses are weakly  palpable  B/L.  Capillary return  WNL.  Temperature gradient is  WNL.  Skin turgor  WNL  Sensorium  Senn Weinstein monofilament wire  WNL. Normal tactile sensation.  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  Orthopedic  Exam  Muscle tone and muscle strength  WNL.  No limitations of motion feet  B/L.  No crepitus or joint effusion noted.  Foot type is unremarkable and digits show no abnormalities.  Amputation digits  B/L    Skin  No open lesions.  Normal skin texture and turgor. Porokeratosis sub 2 right.  Callus at distal aspect left foot.    Porokeratosis  B/L.  Debridement of porokeratosis/callus  B/L.  RTC 10 weeks.   Gardiner Barefoot DPM

## 2018-09-24 ENCOUNTER — Telehealth: Payer: Self-pay | Admitting: Internal Medicine

## 2018-09-24 NOTE — Telephone Encounter (Signed)
Spoke with pt's sister and I advised her that there wasn't any notes in the chart of who called or what they called for. She understood and nothing further is needed.

## 2018-09-25 ENCOUNTER — Other Ambulatory Visit: Payer: Self-pay

## 2018-09-25 ENCOUNTER — Ambulatory Visit (INDEPENDENT_AMBULATORY_CARE_PROVIDER_SITE_OTHER): Payer: Medicare Other | Admitting: Podiatry

## 2018-09-25 ENCOUNTER — Encounter: Payer: Self-pay | Admitting: Podiatry

## 2018-09-25 VITALS — Temp 97.7°F

## 2018-09-25 DIAGNOSIS — D689 Coagulation defect, unspecified: Secondary | ICD-10-CM | POA: Diagnosis not present

## 2018-09-25 DIAGNOSIS — Z89422 Acquired absence of other left toe(s): Secondary | ICD-10-CM

## 2018-09-25 DIAGNOSIS — Q828 Other specified congenital malformations of skin: Secondary | ICD-10-CM

## 2018-09-25 DIAGNOSIS — I739 Peripheral vascular disease, unspecified: Secondary | ICD-10-CM

## 2018-09-25 DIAGNOSIS — Z89421 Acquired absence of other right toe(s): Secondary | ICD-10-CM

## 2018-09-25 NOTE — Progress Notes (Signed)
This patient presents to the office for treatment of his calluses which have developed on both feet following frostbite. He says these calluses are painful walking and wearing his shoes. He presents to the office for continued preventative foot care services. Patient is taking plavix.  Vascular  Dorsalis pedis and posterior tibial pulses are weakly  palpable  B/L.  Capillary return  WNL.  Temperature gradient is  WNL.  Skin turgor  WNL  Sensorium  Senn Weinstein monofilament wire  WNL. Normal tactile sensation.  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  Orthopedic  Exam  Muscle tone and muscle strength  WNL.  No limitations of motion feet  B/L.  No crepitus or joint effusion noted.  Foot type is unremarkable and digits show no abnormalities.  Amputation digits  B/L    Skin  No open lesions.  Normal skin texture and turgor. Porokeratosis sub 2 right.  Callus at distal aspect left foot.    Porokeratosis  B/L.  Debridement of porokeratosis/callus  B/L.  RTC 10 weeks.   Gardiner Barefoot DPM

## 2018-09-26 DIAGNOSIS — R351 Nocturia: Secondary | ICD-10-CM | POA: Diagnosis not present

## 2018-09-26 DIAGNOSIS — R3915 Urgency of urination: Secondary | ICD-10-CM | POA: Diagnosis not present

## 2018-10-01 ENCOUNTER — Ambulatory Visit: Payer: Medicare Other | Admitting: Internal Medicine

## 2018-10-02 DIAGNOSIS — F209 Schizophrenia, unspecified: Secondary | ICD-10-CM | POA: Diagnosis not present

## 2018-10-07 ENCOUNTER — Encounter: Payer: Self-pay | Admitting: Internal Medicine

## 2018-10-07 ENCOUNTER — Ambulatory Visit (INDEPENDENT_AMBULATORY_CARE_PROVIDER_SITE_OTHER): Payer: Medicare Other | Admitting: Internal Medicine

## 2018-10-07 ENCOUNTER — Other Ambulatory Visit: Payer: Self-pay

## 2018-10-07 VITALS — BP 116/74 | HR 99 | Temp 98.3°F | Ht 71.0 in | Wt 152.6 lb

## 2018-10-07 DIAGNOSIS — J449 Chronic obstructive pulmonary disease, unspecified: Secondary | ICD-10-CM

## 2018-10-07 MED ORDER — UMECLIDINIUM-VILANTEROL 62.5-25 MCG/INH IN AEPB: 1.0000 | INHALATION_SPRAY | Freq: Every day | RESPIRATORY_TRACT | 0 refills | Status: DC

## 2018-10-07 NOTE — Progress Notes (Signed)
Subjective:   Patient ID: Ivan Daniels, male    DOB: 09/08/1945,    MRN: [id number]   Brief patient profile:  58 yobm  MM quit smoking 01/2016  with GOLD II copd main to lama/laba prev under care of Dr Rhea Pink with IIIa Turner chemo/RT with f/u by [facility]   Entry 12/19/17  DIAGNOSIS: Stage IIIA (T2a, N2, M0) non-small cell lung cancer consistent with invasive squamous cell carcinoma presented with right upper lobe lung mass in addition to right hilar and mediastinal lymphadenopathy diagnosed in September 2016.  PRIOR THERAPY:  1) Course of concurrent chemoradiation with weekly carboplatin for AUC of 2 and paclitaxel 45 MG/M2. First dose 03/01/2015. He is status post 6 cycles. Last cycle was given 04/12/2015 with partial response. 2) Consolidation chemotherapy with reduced dose carboplatin for AUC of 5 and paclitaxel 175 MG/M2 every 3 weeks, status post 3 cycles.  CURRENT THERAPY: Observation   History of Present Illness 04/01/2018  Transition of care/ Ivan Daniels  GOLD II/ anoro maint rx  Chief Complaint  Patient presents with  . Follow-up    Former patient of Dr Ashok Cordia. He states breathing is "alright".  He is using anoro and does not have a rescue inhaler. Spouse reports he coughs in the night.   Dyspnea:  [employer] slow pace/ pushing buggy/ [facility] parking/ landing s stopping MMRC3 = can't walk 100 yards even at a slow pace at a flat grade s stopping due to sob   Cough: dry / cough does not bother him or wake him at night  Sleeping: 1-2 pillows, bed flat  SABA use: none 02: none  rec F/u in 6 m     10/07/2018  f/u ov/Ivan Daniels re: GOLD II / Group B  On anoro maint / no aecopds Chief Complaint  Patient presents with  . Follow-up    Breathing is doing well and no new co's.   Dyspnea:  [employer] ok once a week o/w very sedentary / gets let off at door and uses hc parking = MMRC3 = can't walk 100 yards even at a slow pace at a flat grade s stopping due to sob   Cough: sporadic, not in am  /non-productive Sleeping: ok flat  SABA use: none 02: none     No obvious day to day or daytime variability or assoc excess/ purulent sputum or mucus plugs or hemoptysis or cp or chest tightness, subjective wheeze or overt sinus or hb symptoms.   Sleeping as above  without nocturnal  or early am exacerbation  of respiratory  c/o's or need for noct saba. Also denies any obvious fluctuation of symptoms with weather or environmental changes or other aggravating or alleviating factors except as outlined above   No unusual exposure hx or h/o childhood pna/ asthma or knowledge of premature birth.  Current Allergies, Complete Past Medical History, Past Surgical History, Family History, and Social History were reviewed in [employer] record.  ROS  The following are not active complaints unless bolded Hoarseness, sore throat, dysphagia, dental problems, itching, sneezing,  nasal congestion or discharge of excess mucus or purulent secretions, ear ache,   fever, chills, sweats, unintended wt loss or wt gain, classically pleuritic or exertional cp,  orthopnea pnd or arm/hand swelling  or leg swelling, presyncope, palpitations, abdominal pain, anorexia, nausea, vomiting, diarrhea  or change in bowel habits or change in bladder habits, change in stools or change in urine, dysuria, hematuria,  rash, arthralgias, visual complaints, headache,  numbness, weakness or ataxia or problems with walking or coordination,  change in mood or  memory.        Current Meds  Medication Sig  . ALPHAGAN P 0.1 % SOLN Place 1 drop into both eyes 3 (three) times daily.  . benztropine (COGENTIN) 0.5 MG tablet Take 0.5 mg by mouth at bedtime.   . chlorhexidine (PERIDEX) 0.12 % solution 1 mL by Mouth Rinse route as needed.   . clopidogrel (PLAVIX) 75 MG tablet Take 75 mg by mouth daily.   . ferrous fumarate (HEMOCYTE - 106 MG FE) 325 (106 FE) MG TABS tablet Take 1 tablet by mouth daily.   . haloperidol  (HALDOL) 5 MG tablet Take 5 mg by mouth at bedtime.   . Multiple Vitamins-Minerals (MULTIVITAMIN WITH MINERALS) tablet Take 1 tablet by mouth daily.  . NONFORMULARY OR COMPOUNDED [place]  Combination Pain Cream -  Baclofen 2%, Doxepin 5%, Gabapentin 6%, Topiramate 2%, Pentoxifylline 3% Apply 1-2 grams to affected area 3-4 times daily Qty. 120 gm 3 refills  . simvastatin (ZOCOR) 20 MG tablet Take 20 mg by mouth at bedtime.   Marland Kitchen umeclidinium-vilanterol (ANORO ELLIPTA) 62.5-25 MCG/INH AEPB Inhale 1 puff into the lungs daily.                       Objective:   Physical Exam.   Elderly amb bm nad   10/07/2018          152   04/01/18 151 lb 12.8 oz (68.9 kg)  12/19/17 148 lb 6.4 oz (67.3 kg)  12/01/17 152 lb (68.9 kg)     Vital signs reviewed - Note on arrival 02 sats  99% on RA      HEENT: Edentulous/ nl oropharynx. Nl external ear canals without cough reflex -  Mild bilateral non-specific turbinate edema     NECK :  without JVD/Nodes/TM/ nl carotid upstrokes bilaterally   LUNGS: no acc muscle use,  Mild barrel  contour chest wall with bilateral  Distant bs s audible wheeze and  without cough on insp or exp maneuver and mild  Hyperresonant  to  percussion bilaterally     CV:  RRR  no s3 or murmur or increase in P2, and no edema   ABD:  soft and nontender with pos late insp Hoover's  in the supine position. No bruits or organomegaly appreciated, bowel sounds nl  MS:   Nl gait/  ext warm without deformities, calf tenderness, cyanosis or clubbing No obvious joint restrictions   SKIN: warm and dry without lesions    NEURO:  alert, approp, nl sensorium with  no motor or cerebellar deficits apparent.             PFT 06/13/16: FVC 2.74 L (68%) FEV1 1.75 (57%) FEV1/FVC is 0.64 FEF 25-75 1.00) 3%) negative bronchodilator response TLC 4.44 L (61%) RV 76% ERV 122% DLCO corrected 38%  6MWT 06/15/16:  Walked 222 meters / Baseline Sat 98% on RA / Nadir Sat 97%  on RA 2 minutes after walk  IMAGING CT CHEST W/ CONTRAST 02/08/17 (personally reviewed by me):  Right perihilar and upper lobe opacity with consolidation consistent with postradiation fibrosis. This is not significantly changed with comparison to prior CT scans. No new nodule or opacity appreciated. No pleural effusion or thickening. No pericardial effusion. No pathologic mediastinal adenopathy. Volume loss on the right appears chronic with fibrotic changes.   ESOPHAGRAM/BARIUM SWALLOW 05/19/16  :  Smoothly  tapered stricture of the proximal esophagus at the level of the thoracic inlet preventing passage of barium tablet. Moderate esophageal dysmotility.  CT CHEST W/ CONTRAST 02/28/16 Atypical predominant emphysema & subpleural bleb formation. Right hilar consolidation suggesting fibrotic change with continued stability. Hyperexpansion of left lung volume loss on the right. No new nodule or opacity appreciated. No pathologic mediastinal adenopathy. No pleural effusion or thickening. No pericardial effusion.  PATHOLOGY Right Upper Lobe Bronchial Brush (01/27/15):  Squamous Cell Carcinoma Right Upper Lobe Endobronchial Biopsy (01/27/15):  Squamous Cell Carcinoma EBUS Level 7 FNA (01/27/15):  Squamous Cell Carcinoma        Assessment & Plan:

## 2018-10-07 NOTE — Patient Instructions (Signed)
Continue to use anoro one click each am    Please schedule a follow up visit in 6 months but call sooner if needed

## 2018-10-14 ENCOUNTER — Encounter: Payer: Self-pay | Admitting: Internal Medicine

## 2018-10-14 NOTE — Assessment & Plan Note (Addendum)
Quit smoking 01/2016 PFT  06/13/16:   FEV1 1.75 (57%) FEV1/FVC is 0.64 F  negative bronchodilator response ? On prior rx    DLCO corrected 38%  - 10/07/2018  After extensive coaching inhaler device,  effectiveness =    90%  -  10/07/2018   Walked RA  2 laps @ approx 267ft each @ slow pace  stopped due to end of study, no desats, no sob    Really pt is now @ [facility] = can't walk a nl pace on a flat grade s sob but does fine slow and flat on anoro c/w Group B symptoms/ guidelines and no changes to rx needed unless develops tendency to aecopd in which case would escalate to triple rx = trelegy.  >>> f/u q 6 m appropriate - call sooner if needed    I had an extended discussion with the patient  reviewing all relevant studies completed to date and  lasting 15 to 20 minutes of a 25 minute visit  which included directly observing ambulatory 02 saturation study documented in a/p section of  today's  office note.  See device teaching which also extended face to face time for this visit   Each maintenance medication was reviewed in detail including most importantly the difference between maintenance and prns and under what circumstances the prns are to be triggered using an action plan format that is not reflected in the computer generated alphabetically organized AVS.     Please see AVS for specific instructions unique to this visit that I personally wrote and verbalized to the the pt in detail and then reviewed with pt  by my nurse highlighting any changes in therapy recommended at today's visit .

## 2018-11-15 ENCOUNTER — Ambulatory Visit: Payer: Medicare Other | Admitting: Internal Medicine

## 2018-11-21 DIAGNOSIS — F209 Schizophrenia, unspecified: Secondary | ICD-10-CM | POA: Diagnosis not present

## 2018-11-29 DIAGNOSIS — E611 Iron deficiency: Secondary | ICD-10-CM | POA: Diagnosis not present

## 2018-11-29 DIAGNOSIS — Z79899 Other long term (current) drug therapy: Secondary | ICD-10-CM | POA: Diagnosis not present

## 2018-11-29 DIAGNOSIS — R5383 Other fatigue: Secondary | ICD-10-CM | POA: Diagnosis not present

## 2018-11-29 DIAGNOSIS — R636 Underweight: Secondary | ICD-10-CM | POA: Diagnosis not present

## 2018-11-29 DIAGNOSIS — E782 Mixed hyperlipidemia: Secondary | ICD-10-CM | POA: Diagnosis not present

## 2018-12-02 DIAGNOSIS — Z1159 Encounter for screening for other viral diseases: Secondary | ICD-10-CM | POA: Diagnosis not present

## 2018-12-11 ENCOUNTER — Ambulatory Visit (INDEPENDENT_AMBULATORY_CARE_PROVIDER_SITE_OTHER): Payer: Medicare Other | Admitting: Podiatry

## 2018-12-11 ENCOUNTER — Other Ambulatory Visit: Payer: Self-pay

## 2018-12-11 ENCOUNTER — Encounter: Payer: Self-pay | Admitting: Podiatry

## 2018-12-11 VITALS — Temp 97.2°F

## 2018-12-11 DIAGNOSIS — Z89421 Acquired absence of other right toe(s): Secondary | ICD-10-CM

## 2018-12-11 DIAGNOSIS — Q828 Other specified congenital malformations of skin: Secondary | ICD-10-CM

## 2018-12-11 DIAGNOSIS — D689 Coagulation defect, unspecified: Secondary | ICD-10-CM

## 2018-12-11 NOTE — Progress Notes (Signed)
This patient presents to the office for treatment of his calluses which have developed on both feet following frostbite. He says these calluses are painful walking and wearing his shoes. He presents to the office for continued preventative foot care services. Patient is taking plavix.  Vascular  Dorsalis pedis and posterior tibial pulses are weakly  palpable  B/L.  Capillary return  WNL.  Temperature gradient is  WNL.  Skin turgor  WNL  Sensorium  Senn Weinstein monofilament wire  WNL. Normal tactile sensation.  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  Orthopedic  Exam  Muscle tone and muscle strength  WNL.  No limitations of motion feet  B/L.  No crepitus or joint effusion noted.  Foot type is unremarkable and digits show no abnormalities.  Amputation digits  B/L    Skin  No open lesions.  Normal skin texture and turgor. Porokeratosis sub 2 right.  Callus at distal aspect left foot.    Porokeratosis  B/L.  Debridement of porokeratosis/callus  B/L.  RTC 10 weeks.   Gardiner Barefoot DPM

## 2018-12-13 ENCOUNTER — Telehealth: Payer: Self-pay | Admitting: *Deleted

## 2018-12-13 NOTE — Telephone Encounter (Signed)
Received call from caregiver to confirm appts for Monday, 12/16/18. Appts confirmed. Pt will be here

## 2018-12-16 ENCOUNTER — Inpatient Hospital Stay: Payer: Medicare Other

## 2018-12-16 ENCOUNTER — Other Ambulatory Visit: Payer: Medicare Other

## 2018-12-16 ENCOUNTER — Encounter (HOSPITAL_COMMUNITY): Payer: Self-pay

## 2018-12-16 ENCOUNTER — Other Ambulatory Visit: Payer: Self-pay

## 2018-12-16 ENCOUNTER — Inpatient Hospital Stay: Payer: Medicare Other | Attending: Internal Medicine

## 2018-12-16 ENCOUNTER — Ambulatory Visit (HOSPITAL_COMMUNITY)
Admission: RE | Admit: 2018-12-16 | Discharge: 2018-12-16 | Disposition: A | Discharge status: Home / Self Care | Payer: Medicare Other | Source: Ambulatory Visit | Attending: Internal Medicine | Admitting: Internal Medicine

## 2018-12-16 DIAGNOSIS — Z87891 Personal history of nicotine dependence: Secondary | ICD-10-CM | POA: Diagnosis not present

## 2018-12-16 DIAGNOSIS — C3490 Malignant neoplasm of unspecified part of unspecified bronchus or lung: Secondary | ICD-10-CM | POA: Insufficient documentation

## 2018-12-16 DIAGNOSIS — Z79899 Other long term (current) drug therapy: Secondary | ICD-10-CM | POA: Diagnosis not present

## 2018-12-16 DIAGNOSIS — E785 Hyperlipidemia, unspecified: Secondary | ICD-10-CM | POA: Insufficient documentation

## 2018-12-16 DIAGNOSIS — J449 Chronic obstructive pulmonary disease, unspecified: Secondary | ICD-10-CM

## 2018-12-16 DIAGNOSIS — N400 Enlarged prostate without lower urinary tract symptoms: Secondary | ICD-10-CM | POA: Insufficient documentation

## 2018-12-16 DIAGNOSIS — F200 Paranoid schizophrenia: Secondary | ICD-10-CM | POA: Diagnosis not present

## 2018-12-16 DIAGNOSIS — Z923 Personal history of irradiation: Secondary | ICD-10-CM | POA: Insufficient documentation

## 2018-12-16 DIAGNOSIS — F329 Major depressive disorder, single episode, unspecified: Secondary | ICD-10-CM | POA: Diagnosis not present

## 2018-12-16 DIAGNOSIS — Z7902 Long term (current) use of antithrombotics/antiplatelets: Secondary | ICD-10-CM | POA: Insufficient documentation

## 2018-12-16 DIAGNOSIS — Z9221 Personal history of antineoplastic chemotherapy: Secondary | ICD-10-CM | POA: Diagnosis not present

## 2018-12-16 DIAGNOSIS — J439 Emphysema, unspecified: Secondary | ICD-10-CM | POA: Diagnosis not present

## 2018-12-16 DIAGNOSIS — C3411 Malignant neoplasm of upper lobe, right bronchus or lung: Secondary | ICD-10-CM | POA: Diagnosis not present

## 2018-12-16 LAB — CBC WITH DIFFERENTIAL (CANCER CENTER ONLY)
Abs Immature Granulocytes: 0.01 10*3/uL (ref 0.00–0.07)
Basophils Absolute: 0 10*3/uL (ref 0.0–0.1)
Basophils Relative: 0 %
Eosinophils Absolute: 0 10*3/uL (ref 0.0–0.5)
Eosinophils Relative: 1 %
HCT: 38.1 % — ABNORMAL LOW (ref 39.0–52.0)
Hemoglobin: 12.1 g/dL — ABNORMAL LOW (ref 13.0–17.0)
Immature Granulocytes: 0 %
Lymphocytes Relative: 26 %
Lymphs Abs: 1.3 10*3/uL (ref 0.7–4.0)
MCH: 27.6 pg (ref 26.0–34.0)
MCHC: 31.8 g/dL (ref 30.0–36.0)
MCV: 87 fL (ref 80.0–100.0)
Monocytes Absolute: 0.4 10*3/uL (ref 0.1–1.0)
Monocytes Relative: 8 %
Neutro Abs: 3.2 10*3/uL (ref 1.7–7.7)
Neutrophils Relative %: 65 %
Platelet Count: 230 10*3/uL (ref 150–400)
RBC: 4.38 MIL/uL (ref 4.22–5.81)
RDW: 17.3 % — ABNORMAL HIGH (ref 11.5–15.5)
WBC Count: 4.9 10*3/uL (ref 4.0–10.5)
nRBC: 0 % (ref 0.0–0.2)

## 2018-12-16 LAB — CMP (CANCER CENTER ONLY)
ALT: 9 U/L (ref 0–44)
AST: 17 U/L (ref 15–41)
Albumin: 3.8 g/dL (ref 3.5–5.0)
Alkaline Phosphatase: 53 U/L (ref 38–126)
Anion gap: 12 (ref 5–15)
BUN: 12 mg/dL (ref 8–23)
CO2: 23 mmol/L (ref 22–32)
Calcium: 9.3 mg/dL (ref 8.9–10.3)
Chloride: 106 mmol/L (ref 98–111)
Creatinine: 1.2 mg/dL (ref 0.61–1.24)
GFR, Est AFR Am: >60 mL/min (ref >60)
GFR, Est Non Af Am: >60 mL/min (ref >60)
Glucose, Bld: 106 mg/dL — ABNORMAL HIGH (ref 70–99)
Potassium: 3.7 mmol/L (ref 3.5–5.1)
Sodium: 141 mmol/L (ref 135–145)
Total Bilirubin: 0.2 mg/dL — ABNORMAL LOW (ref 0.3–1.2)
Total Protein: 7.8 g/dL (ref 6.5–8.1)

## 2018-12-16 MED ORDER — HEPARIN SOD (PORK) LOCK FLUSH 100 UNIT/ML IV SOLN
INTRAVENOUS | Status: AC
  Filled 2018-12-16: qty 5

## 2018-12-16 MED ORDER — SODIUM CHLORIDE (PF) 0.9 % IJ SOLN
INTRAMUSCULAR | Status: AC
  Filled 2018-12-16: qty 50

## 2018-12-16 MED ORDER — HEPARIN SOD (PORK) LOCK FLUSH 100 UNIT/ML IV SOLN
500.0000 [IU] | Freq: Once | INTRAVENOUS | Status: AC
  Administered 2018-12-16: 500 [IU] via INTRAVENOUS

## 2018-12-16 MED ORDER — IOHEXOL 300 MG/ML  SOLN
75.0000 mL | Freq: Once | INTRAMUSCULAR | Status: AC | PRN
  Administered 2018-12-16: 75 mL via INTRAVENOUS

## 2018-12-16 MED ORDER — SODIUM CHLORIDE 0.9% FLUSH
10.0000 mL | INTRAVENOUS | Status: DC | PRN
  Administered 2018-12-16: 10 mL
  Filled 2018-12-16: qty 10

## 2018-12-18 ENCOUNTER — Telehealth: Payer: Self-pay | Admitting: Physician Assistant

## 2018-12-18 ENCOUNTER — Other Ambulatory Visit: Payer: Self-pay

## 2018-12-18 ENCOUNTER — Inpatient Hospital Stay (HOSPITAL_BASED_OUTPATIENT_CLINIC_OR_DEPARTMENT_OTHER): Payer: Medicare Other | Admitting: Physician Assistant

## 2018-12-18 ENCOUNTER — Encounter: Payer: Self-pay | Admitting: Physician Assistant

## 2018-12-18 VITALS — BP 120/74 | HR 95 | Temp 98.3°F | Resp 18 | Ht 71.0 in | Wt 152.0 lb

## 2018-12-18 DIAGNOSIS — F329 Major depressive disorder, single episode, unspecified: Secondary | ICD-10-CM | POA: Diagnosis not present

## 2018-12-18 DIAGNOSIS — F200 Paranoid schizophrenia: Secondary | ICD-10-CM | POA: Diagnosis not present

## 2018-12-18 DIAGNOSIS — Z7189 Other specified counseling: Secondary | ICD-10-CM | POA: Insufficient documentation

## 2018-12-18 DIAGNOSIS — J439 Emphysema, unspecified: Secondary | ICD-10-CM | POA: Diagnosis not present

## 2018-12-18 DIAGNOSIS — C3491 Malignant neoplasm of unspecified part of right bronchus or lung: Secondary | ICD-10-CM

## 2018-12-18 DIAGNOSIS — C3411 Malignant neoplasm of upper lobe, right bronchus or lung: Secondary | ICD-10-CM

## 2018-12-18 DIAGNOSIS — E785 Hyperlipidemia, unspecified: Secondary | ICD-10-CM | POA: Diagnosis not present

## 2018-12-18 DIAGNOSIS — N400 Enlarged prostate without lower urinary tract symptoms: Secondary | ICD-10-CM | POA: Diagnosis not present

## 2018-12-18 NOTE — Progress Notes (Signed)
Ivan Daniels  Ivan Broad, FNP [address] 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  PRIOR THERAPY: 1) Course of concurrent chemoradiation with weekly carboplatin for AUC of 2 and paclitaxel 45 MG/M2. First dose 03/01/2015. He is status post 6 cycles. Last cycle was given 04/12/2015 with partial response. 2) Consolidation chemotherapy with reduced dose carboplatin for AUC of 5 and paclitaxel 175 MG/M2 every 3 weeks, status post 3 cycles.  CURRENT THERAPY: Observation   INTERVAL HISTORY: Ivan Daniels 73 y.o. male returns to the clinic for a follow-up visit.  The patient sister was available by phone today.  The patient is feeling well today with no concerning complaints.  The patient has been on observation for over 3 years.  He denies any fever, chills, night sweats, weight loss.  He denies any chest pain, shortness of breath, cough, or hemoptysis.  He denies any nausea, vomiting, diarrhea, or constipation.  He denies any headache or visual changes.  The patient recently had a restaging CT scan performed.  He is here today for evaluation and to review his scan results.    MEDICAL HISTORY: Past Medical History:  Diagnosis Date  . ALCOHOL ABUSE 11/12/2009   Qualifier: Diagnosis of  By: Hassell Done FNP, Tori Milks    . Allergy   . Anemia   . Anxiety   . BENIGN PROSTATIC HYPERTROPHY, HX OF 12/19/2006   Qualifier: Diagnosis of  By: Radene Ou MD, [place]    . Blood transfusion without reported diagnosis 2017  . Clotting disorder ([place])    takes PLAVIX  . Depression   . Emphysema of lung ([place])   . Full code status 02/10/2015  . GERD (gastroesophageal reflux disease)   . Hyperlipidemia   . Lung mass 01/25/2015   3.7 x 3.6 cm RUL  spiculated lung mass with 2.0 cm right paratracheal lymph node suspicious for bronchogenic CA  . Non-small cell lung cancer ([facility]) 01/27/15   non small-cell,squamous cell ca RUL  . Paranoid schizophrenia ([facility]) 10/31/2009   Qualifier: Diagnosis of  By: Jorene Minors, [place]    . Primary spontaneous pneumothorax, left 01/25/2015  . TOBACCO ABUSE 05/24/2009   Qualifier: Diagnosis of  By: Hassell Done FNP, [place]      ALLERGIES:  is allergic to benztropine mesylate and chlorpromazine hcl.  MEDICATIONS:  Current Outpatient Medications  Medication Sig Dispense Refill  . ALPHAGAN P 0.1 % SOLN Place 1 drop into both eyes 3 (three) times daily.  6  . benztropine (COGENTIN) 0.5 MG tablet Take 0.5 mg by mouth at bedtime.     . chlorhexidine (PERIDEX) 0.12 % solution 1 mL by Mouth Rinse route as needed.   1  . clopidogrel (PLAVIX) 75 MG tablet Take 75 mg by mouth daily.     . ferrous fumarate (HEMOCYTE - 106 MG FE) 325 (106 FE) MG TABS tablet Take 1 tablet by mouth daily.     . haloperidol (HALDOL) 5 MG tablet Take 5 mg by mouth at bedtime.     . Multiple Vitamins-Minerals (MULTIVITAMIN WITH MINERALS) tablet Take 1 tablet by mouth daily.    . NONFORMULARY OR COMPOUNDED [place]  Combination Pain Cream -  Baclofen 2%, Doxepin 5%, Gabapentin 6%, Topiramate 2%, Pentoxifylline 3% Apply 1-2 grams to affected area 3-4  times daily Qty. 120 gm 3 refills 1 each 3  . simvastatin (ZOCOR) 20 MG tablet Take 20 mg by mouth at bedtime.     Marland Kitchen umeclidinium-vilanterol (ANORO ELLIPTA) 62.5-25 MCG/INH AEPB Inhale 1 puff into the lungs daily. 1 each 11   Current Facility-Administered Medications  Medication Dose Route Frequency Provider Last Rate Last Dose  . 0.9 %  sodium chloride infusion  500 mL Intravenous Continuous Irene Shipper, MD      . 0.9 %  sodium chloride infusion  500 mL Intravenous Continuous Irene Shipper, MD      . 0.9 %  sodium chloride infusion  500 mL Intravenous Continuous Irene Shipper, MD         SURGICAL HISTORY:  Past Surgical History:  Procedure Laterality Date  . APPENDECTOMY    . CHEST TUBE INSERTION Left   . COLONOSCOPY    . INSERTION CENTRAL VENOUS ACCESS DEVICE W/ SUBCUTANEOUS PORT Right   . LUNG BIOPSY Right 01/27/2015   Procedure: Right Upper Lobe Bronchus BIOPSY;  Surgeon: Grace Isaac, MD;  Location: [facility];  Service: Thoracic;  Laterality: Right;  Marland Kitchen VIDEO BRONCHOSCOPY WITH ENDOBRONCHIAL ULTRASOUND N/A 01/27/2015   Procedure: VIDEO BRONCHOSCOPY WITH ENDOBRONCHIAL ULTRASOUND;  Surgeon: Grace Isaac, MD;  Location: [facility];  Service: Thoracic;  Laterality: N/A;    REVIEW OF SYSTEMS:   Review of Systems  Constitutional: Negative for appetite change, chills, fatigue, fever and unexpected weight change.  HENT:   Negative for mouth sores, nosebleeds, sore throat and trouble swallowing.   Eyes: Negative for eye problems and icterus.  Respiratory: Negative for cough, hemoptysis, shortness of breath and wheezing.   Cardiovascular: Negative for chest pain and leg swelling.  Gastrointestinal: Negative for abdominal pain, constipation, diarrhea, nausea and vomiting.  Genitourinary: Negative for bladder incontinence, difficulty urinating, dysuria, frequency and hematuria.   Musculoskeletal: Negative for back pain, gait problem, neck pain and neck stiffness.  Skin: Negative for itching and rash.  Neurological: Negative for dizziness, extremity weakness, gait problem, headaches, light-headedness and seizures.  Hematological: Negative for adenopathy. Does not bruise/bleed easily.  Psychiatric/Behavioral: Negative for confusion, depression and sleep disturbance. The patient is not nervous/anxious.     PHYSICAL EXAMINATION:  Blood pressure 120/74, pulse 95, temperature 98.3 F (36.8 C), temperature source Oral, resp. rate 18, height 5\' 11"  (1.803 m), weight 152 lb (68.9 kg), SpO2 100 %.  ECOG PERFORMANCE STATUS: 1 - Symptomatic but completely ambulatory  Physical  Exam  Constitutional: Oriented to person, place, and time and thin-appearing male and in no distress. HENT:  Head: Normocephalic and atraumatic.  Mouth/Throat: Oropharynx is clear and moist. No oropharyngeal exudate.  Eyes: Conjunctivae are normal. Right eye exhibits no discharge. Left eye exhibits no discharge. No scleral icterus.  Neck: Normal range of motion. Neck supple.  Cardiovascular: Normal rate, regular rhythm, normal heart sounds and intact distal pulses.   Pulmonary/Chest: Effort normal and breath sounds normal. No respiratory distress. No wheezes. No rales.  Abdominal: Soft. Bowel sounds are normal. Exhibits no distension and no mass. There is no tenderness.  Musculoskeletal: Normal range of motion. Exhibits no edema.  Lymphadenopathy:    No cervical adenopathy.  Neurological: Alert and oriented to person, place, and time. Exhibits normal muscle tone. Gait normal. Coordination normal.  Skin: Skin is warm and dry. No rash noted. Not diaphoretic. No erythema. No pallor.  Psychiatric: Mood, memory and judgment normal.  Vitals reviewed.  LABORATORY DATA: Lab Results  Component Value Date  WBC 4.9 12/16/2018   HGB 12.1 (L) 12/16/2018   HCT 38.1 (L) 12/16/2018   MCV 87.0 12/16/2018   PLT 230 12/16/2018      Chemistry      Component Value Date/Time   NA 141 12/16/2018 0755   NA 140 02/08/2017 0822   K 3.7 12/16/2018 0755   K 4.5 02/08/2017 0822   CL 106 12/16/2018 0755   CO2 23 12/16/2018 0755   CO2 25 02/08/2017 0822   BUN 12 12/16/2018 0755   BUN 8.8 02/08/2017 0822   CREATININE 1.20 12/16/2018 0755   CREATININE 1.1 02/08/2017 0822      Component Value Date/Time   CALCIUM 9.3 12/16/2018 0755   CALCIUM 9.6 02/08/2017 0822   ALKPHOS 53 12/16/2018 0755   ALKPHOS 53 02/08/2017 0822   AST 17 12/16/2018 0755   AST 19 02/08/2017 0822   ALT 9 12/16/2018 0755   ALT 9 02/08/2017 0822   BILITOT 0.2 (L) 12/16/2018 0755   BILITOT <0.22 02/08/2017 0822        RADIOGRAPHIC STUDIES:  Ct Chest W Contrast  Result Date: 12/16/2018 CLINICAL DATA:  Non-small-cell lung cancer. EXAM: CT CHEST WITH CONTRAST TECHNIQUE: Multidetector CT imaging of the chest was performed during intravenous contrast administration. CONTRAST:  70mL OMNIPAQUE IOHEXOL 300 MG/ML  SOLN COMPARISON:  06/17/2018 FINDINGS: Cardiovascular: The heart size is normal. No substantial pericardial effusion. Coronary artery calcification is evident. Right Port-A-Cath tip is in the mid to lower right atrium. Mediastinum/Nodes: 9 mm short axis precarinal node measured previously is 10 mm today. No left hilar lymphadenopathy. Similar appearance of abnormal soft tissue in the upper right hilum. Lungs/Pleura: Rim enhancing lesion in the parahilar aspect of the medial right upper lobe has increased in size in the interval measuring 3.7 x 2.8 cm today compared to 2.9 x 2.9 cm previously. Loculated pleural fluid in the upper right hemithorax 11 is similar to prior. Treatment changes in the parahilar right lung are similar as is the volume loss in the right hemithorax. In paraseptal emphysema noted bilaterally. 5 mm left upper lobe nodule noted previously has progressed in the interval measuring 9 mm on 78/5 today. Upper Abdomen: Scattered tiny hypodensities in the liver parenchyma are stable, too small to characterize but likely benign. Adrenal glands incompletely visualized without nodule or mass evident. Musculoskeletal: No worrisome lytic or sclerotic osseous abnormality. IMPRESSION: 1. Interval progression of the rim enhancing lesion in the medial right upper lobe, concerning for disease progression. 2. Continued further enlargement of the left upper lobe pulmonary nodule now measuring 9 mm. Imaging features highly suspicious for metastatic disease although second primary lesion not excluded. 3.  Emphysema. ([id number]) Electronically Signed   By: Misty Stanley M.D.   On: 12/16/2018 10:36      ASSESSMENT/PLAN:  This is a very pleasant 73 year old African-American male diagnosed with stage IIIa non-small cell lung cancer, squamous cell carcinoma.  He was diagnosed in April 2016.  He presented with a right upper lobe lung mass and right hilar and mediastinal lymphadenopathy.  He is status post concurrent chemoradiation followed by consolidation chemotherapy.  He has been on observation for over 3 years.  The patient recently had a restaging CT scan performed.  Dr. Julien Nordmann personally and independently reviewed the scan and discussed the results with patient today and his sister, who was available by phone. The scan showed continued enlargement of the left upper lobe pulmonary nodule and interval progression of consolidation in the right upper lobe.  Dr. Julien Nordmann recommends obtaining a PET scan to further evaluate these changes.   I will arrange for the patient to have a restaging PET scan in about 1 week. We will see the patient back in about 2 weeks to review the scan results and treatment options.  The patient was advised to call immediately if he has any concerning symptoms in the interval. The patient voices understanding of current disease status and treatment options and is in agreement with the current care plan. All questions were answered. The patient knows to call the clinic with any problems, questions or concerns. We can certainly see the patient much sooner if necessary  Orders Placed This Encounter  Procedures  . NM PET Image Restag (PS) Skull Base To Thigh    Standing Status:   Future    Standing Expiration Date:   12/18/2019    Order Specific Question:   ** REASON FOR EXAM (FREE TEXT)    Answer:   Restaging Lung Cancer    Order Specific Question:   If indicated for the ordered procedure, I authorize the administration of a radiopharmaceutical per Radiology protocol    Answer:   Yes    Order Specific Question:   Preferred imaging location?    Answer:   [facility]    Order Specific Question:   Radiology Contrast Protocol - do NOT remove file path    Answer:   [email or web address]      L , PA-C 12/18/18  ADDENDUM: Hematology/Oncology Attending: I had a face-to-face encounter with the patient today.  I recommended her care plan.  This is a very pleasant 73 years old white male with history of a stage IIIa non-small cell lung cancer, squamous cell carcinoma status post concurrent chemoradiation followed by consolidation chemotherapy and has been in observation for over 3 years. The patient has been doing well with no concerning complaints. He had repeat CT scan of the chest performed recently.  I personally and independently reviewed the scan images and discussed the results with the patient and his sister who was available by phone. His a scan showed concerning findings for disease progression with increase in the opacity in the right upper lobe as well as enlarging left upper lobe nodule. I recommended for the patient to have a PET scan performed for further evaluation of these lesions and to rule out disease progression. We will see him back for follow-up visit in 2 weeks for evaluation and discussion of his treatment options based on the PET scan results. He was advised to call immediately if he has any concerning symptoms in the interval.  Disclaimer: This Daniels was dictated with voice recognition software. Similar sounding words can inadvertently be transcribed and may be missed upon review. Eilleen Kempf, MD 12/18/18

## 2018-12-18 NOTE — Patient Instructions (Addendum)
-  Attached is blood work and a copy of his scan. His recent CT scan showed some areas of increased consolidation and a small nodule that is getting larger.  -We will scheduled for a PET scan to be performed in about 1 week to further evaluate these regions.  -We will see him back in about 2 weeks to discuss the scan results and what the best plan is going forward depending on the PET scan results.

## 2018-12-18 NOTE — Telephone Encounter (Signed)
Called pt per 8/12 los - unable to reach pt mailed letter with appt date and time

## 2018-12-24 ENCOUNTER — Telehealth: Payer: Self-pay | Admitting: Internal Medicine

## 2018-12-24 ENCOUNTER — Telehealth: Payer: Self-pay

## 2018-12-24 ENCOUNTER — Other Ambulatory Visit: Payer: Self-pay | Admitting: Medical Oncology

## 2018-12-24 ENCOUNTER — Telehealth: Payer: Self-pay | Admitting: Medical Oncology

## 2018-12-24 DIAGNOSIS — C3411 Malignant neoplasm of upper lobe, right bronchus or lung: Secondary | ICD-10-CM

## 2018-12-24 NOTE — Telephone Encounter (Signed)
appt confirmed for tomorrow.

## 2018-12-24 NOTE — Telephone Encounter (Signed)
Left vm for pt regarding prescreening questions for appointment on 8/19.

## 2018-12-24 NOTE — Telephone Encounter (Signed)
R/s appt per 8/18 sch message - left message with new appt date and time

## 2018-12-25 ENCOUNTER — Inpatient Hospital Stay: Payer: Medicare Other

## 2018-12-25 ENCOUNTER — Ambulatory Visit (HOSPITAL_COMMUNITY)
Admission: RE | Admit: 2018-12-25 | Discharge: 2018-12-25 | Disposition: A | Discharge status: Home / Self Care | Payer: Medicare Other | Source: Ambulatory Visit | Attending: Physician Assistant | Admitting: Physician Assistant

## 2018-12-25 ENCOUNTER — Other Ambulatory Visit: Payer: Self-pay

## 2018-12-25 DIAGNOSIS — J439 Emphysema, unspecified: Secondary | ICD-10-CM | POA: Insufficient documentation

## 2018-12-25 DIAGNOSIS — I2510 Atherosclerotic heart disease of native coronary artery without angina pectoris: Secondary | ICD-10-CM | POA: Insufficient documentation

## 2018-12-25 DIAGNOSIS — I700 Atherosclerosis of aorta: Secondary | ICD-10-CM | POA: Diagnosis not present

## 2018-12-25 DIAGNOSIS — E785 Hyperlipidemia, unspecified: Secondary | ICD-10-CM | POA: Diagnosis not present

## 2018-12-25 DIAGNOSIS — F329 Major depressive disorder, single episode, unspecified: Secondary | ICD-10-CM | POA: Diagnosis not present

## 2018-12-25 DIAGNOSIS — N400 Enlarged prostate without lower urinary tract symptoms: Secondary | ICD-10-CM | POA: Diagnosis not present

## 2018-12-25 DIAGNOSIS — C3411 Malignant neoplasm of upper lobe, right bronchus or lung: Secondary | ICD-10-CM

## 2018-12-25 DIAGNOSIS — F200 Paranoid schizophrenia: Secondary | ICD-10-CM | POA: Diagnosis not present

## 2018-12-25 LAB — CMP (CANCER CENTER ONLY)
ALT: 9 U/L (ref 0–44)
AST: 20 U/L (ref 15–41)
Albumin: 4.1 g/dL (ref 3.5–5.0)
Alkaline Phosphatase: 62 U/L (ref 38–126)
Anion gap: 13 (ref 5–15)
BUN: 9 mg/dL (ref 8–23)
CO2: 22 mmol/L (ref 22–32)
Calcium: 9.7 mg/dL (ref 8.9–10.3)
Chloride: 103 mmol/L (ref 98–111)
Creatinine: 1.31 mg/dL — ABNORMAL HIGH (ref 0.61–1.24)
GFR, Est AFR Am: >60 mL/min (ref >60)
GFR, Estimated: 54 mL/min — ABNORMAL LOW (ref >60)
Glucose, Bld: 83 mg/dL (ref 70–99)
Potassium: 4.9 mmol/L (ref 3.5–5.1)
Sodium: 138 mmol/L (ref 135–145)
Total Bilirubin: 0.3 mg/dL (ref 0.3–1.2)
Total Protein: 8.6 g/dL — ABNORMAL HIGH (ref 6.5–8.1)

## 2018-12-25 LAB — CBC WITH DIFFERENTIAL (CANCER CENTER ONLY)
Abs Immature Granulocytes: 0.02 10*3/uL (ref 0.00–0.07)
Basophils Absolute: 0 10*3/uL (ref 0.0–0.1)
Basophils Relative: 0 %
Eosinophils Absolute: 0 10*3/uL (ref 0.0–0.5)
Eosinophils Relative: 1 %
HCT: 42.3 % (ref 39.0–52.0)
Hemoglobin: 13.3 g/dL (ref 13.0–17.0)
Immature Granulocytes: 0 %
Lymphocytes Relative: 32 %
Lymphs Abs: 1.9 10*3/uL (ref 0.7–4.0)
MCH: 27.4 pg (ref 26.0–34.0)
MCHC: 31.4 g/dL (ref 30.0–36.0)
MCV: 87.2 fL (ref 80.0–100.0)
Monocytes Absolute: 0.6 10*3/uL (ref 0.1–1.0)
Monocytes Relative: 9 %
Neutro Abs: 3.4 10*3/uL (ref 1.7–7.7)
Neutrophils Relative %: 58 %
Platelet Count: 200 10*3/uL (ref 150–400)
RBC: 4.85 MIL/uL (ref 4.22–5.81)
RDW: 17.2 % — ABNORMAL HIGH (ref 11.5–15.5)
WBC Count: 6 10*3/uL (ref 4.0–10.5)
nRBC: 0 % (ref 0.0–0.2)

## 2018-12-25 LAB — GLUCOSE, CAPILLARY: Glucose-Capillary: 72 mg/dL (ref 70–99)

## 2018-12-25 MED ORDER — FLUDEOXYGLUCOSE F - 18 (FDG) INJECTION
7.5400 | Freq: Once | INTRAVENOUS | Status: AC | PRN
  Administered 2018-12-25: 14:00:00 7.54 via INTRAVENOUS

## 2019-01-01 ENCOUNTER — Other Ambulatory Visit: Payer: Self-pay

## 2019-01-01 ENCOUNTER — Encounter: Payer: Self-pay | Admitting: Internal Medicine

## 2019-01-01 ENCOUNTER — Inpatient Hospital Stay (HOSPITAL_BASED_OUTPATIENT_CLINIC_OR_DEPARTMENT_OTHER): Payer: Medicare Other | Admitting: Internal Medicine

## 2019-01-01 VITALS — BP 110/63 | HR 98 | Temp 98.2°F | Resp 17 | Ht 71.0 in | Wt 151.1 lb

## 2019-01-01 DIAGNOSIS — C3491 Malignant neoplasm of unspecified part of right bronchus or lung: Secondary | ICD-10-CM

## 2019-01-01 DIAGNOSIS — C3411 Malignant neoplasm of upper lobe, right bronchus or lung: Secondary | ICD-10-CM | POA: Diagnosis not present

## 2019-01-01 DIAGNOSIS — Z7189 Other specified counseling: Secondary | ICD-10-CM | POA: Diagnosis not present

## 2019-01-01 DIAGNOSIS — J439 Emphysema, unspecified: Secondary | ICD-10-CM | POA: Diagnosis not present

## 2019-01-01 DIAGNOSIS — Z5111 Encounter for antineoplastic chemotherapy: Secondary | ICD-10-CM | POA: Diagnosis not present

## 2019-01-01 DIAGNOSIS — R5382 Chronic fatigue, unspecified: Secondary | ICD-10-CM | POA: Diagnosis not present

## 2019-01-01 DIAGNOSIS — F329 Major depressive disorder, single episode, unspecified: Secondary | ICD-10-CM | POA: Diagnosis not present

## 2019-01-01 DIAGNOSIS — Z5112 Encounter for antineoplastic immunotherapy: Secondary | ICD-10-CM | POA: Diagnosis not present

## 2019-01-01 DIAGNOSIS — F200 Paranoid schizophrenia: Secondary | ICD-10-CM | POA: Diagnosis not present

## 2019-01-01 DIAGNOSIS — E785 Hyperlipidemia, unspecified: Secondary | ICD-10-CM | POA: Diagnosis not present

## 2019-01-01 DIAGNOSIS — N400 Enlarged prostate without lower urinary tract symptoms: Secondary | ICD-10-CM | POA: Diagnosis not present

## 2019-01-01 NOTE — Progress Notes (Signed)
[place] [phone]) [phone]   [phone]  OFFICE PROGRESS NOTE  [name], FNP [address] [place] [address]  DIAGNOSIS: Recurrent non-small cell lung cancer initially diagnosed as stage IIIA (T2a, N2, M0) non-small cell lung cancer consistent with invasive squamous cell carcinoma presented with right upper lobe lung mass in addition to right hilar and mediastinal lymphadenopathy diagnosed in September 2016.  He has disease recurrence in August 2020.  PRIOR THERAPY:  1) Course of concurrent chemoradiation with weekly carboplatin for AUC of 2 and paclitaxel 45 MG/M2. First dose 03/01/2015. He is status post 6 cycles. Last cycle was given 04/12/2015 with partial response. 2) Consolidation chemotherapy with reduced dose carboplatin for AUC of 5 and paclitaxel 175 MG/M2 every 3 weeks, status post 3 cycles.  CURRENT THERAPY: Systemic chemotherapy with carboplatin for AUC of 5, paclitaxel 175 mg/M2 with nivolumab 360 mg IV every 3 weeks in addition to ipilimumab 1 mg/KG every 6 weeks.  First dose 01/08/2019.  INTERVAL HISTORY: Ivan Daniels 73 y.o. male returns to the clinic today for follow-up visit.  The patient is feeling fine today with no concerning complaints.  He denied having any chest pain, shortness of breath, cough or hemoptysis.  He denied having any fever or chills.  He has no nausea, vomiting, diarrhea or constipation.  He denied having any headache or visual changes.  He denied having any weight loss or night sweats.  The patient was found on recent CT scan of the chest to have concerning findings for disease recurrence.  He had a PET scan performed recently and is here for evaluation and discussion of his scan results and treatment options.    MEDICAL HISTORY: Past Medical History:  Diagnosis Date  . ALCOHOL ABUSE 11/12/2009   Qualifier: Diagnosis of  By: Hassell Done FNP, Tori Milks    . Allergy   . Anemia   . Anxiety   .  BENIGN PROSTATIC HYPERTROPHY, HX OF 12/19/2006   Qualifier: Diagnosis of  By: Radene Ou MD, [place]    . Blood transfusion without reported diagnosis 2017  . Clotting disorder ([facility])    takes PLAVIX  . Depression   . Emphysema of lung ([facility])   . Full code status 02/10/2015  . GERD (gastroesophageal reflux disease)   . Hyperlipidemia   . Lung mass 01/25/2015   3.7 x 3.6 cm RUL spiculated lung mass with 2.0 cm right paratracheal lymph node suspicious for bronchogenic CA  . Non-small cell lung cancer ([facility]) 01/27/15   non small-cell,squamous cell ca RUL  . Paranoid schizophrenia ([facility]) 10/31/2009   Qualifier: Diagnosis of  By: Jorene Minors, [place]    . Primary spontaneous pneumothorax, left 01/25/2015  . TOBACCO ABUSE 05/24/2009   Qualifier: Diagnosis of  By: Hassell Done FNP, [place]      ALLERGIES:  is allergic to benztropine mesylate and chlorpromazine hcl.  MEDICATIONS:  Current Outpatient Medications  Medication Sig Dispense Refill  . ALPHAGAN P 0.1 % SOLN Place 1 drop into both eyes 3 (three) times daily.  6  . benztropine (COGENTIN) 0.5 MG tablet Take 0.5 mg by mouth at bedtime.     . chlorhexidine (PERIDEX) 0.12 % solution 1 mL by Mouth Rinse route as needed.   1  . clopidogrel (PLAVIX) 75 MG tablet Take 75 mg by mouth daily.     . ferrous fumarate (HEMOCYTE - 106 MG FE) 325 (106 FE) MG TABS tablet Take 1 tablet by mouth daily.     Marland Kitchen  haloperidol (HALDOL) 5 MG tablet Take 5 mg by mouth at bedtime.     . Multiple Vitamins-Minerals (MULTIVITAMIN WITH MINERALS) tablet Take 1 tablet by mouth daily.    . NONFORMULARY OR COMPOUNDED Elim  Combination Pain Cream -  Baclofen 2%, Doxepin 5%, Gabapentin 6%, Topiramate 2%, Pentoxifylline 3% Apply 1-2 grams to affected area 3-4 times daily Qty. 120 gm 3 refills 1 each 3  . simvastatin (ZOCOR) 20 MG tablet Take 20 mg by mouth at bedtime.     Marland Kitchen umeclidinium-vilanterol (ANORO ELLIPTA) 62.5-25 MCG/INH AEPB Inhale 1 puff into the lungs  daily. 1 each 11   Current Facility-Administered Medications  Medication Dose Route Frequency Provider Last Rate Last Dose  . 0.9 %  sodium chloride infusion  500 mL Intravenous Continuous Irene Shipper, MD      . 0.9 %  sodium chloride infusion  500 mL Intravenous Continuous Irene Shipper, MD      . 0.9 %  sodium chloride infusion  500 mL Intravenous Continuous Irene Shipper, MD        SURGICAL HISTORY:  Past Surgical History:  Procedure Laterality Date  . APPENDECTOMY    . CHEST TUBE INSERTION Left   . COLONOSCOPY    . INSERTION CENTRAL VENOUS ACCESS DEVICE W/ SUBCUTANEOUS PORT Right   . LUNG BIOPSY Right 01/27/2015   Procedure: Right Upper Lobe Bronchus BIOPSY;  Surgeon: Grace Isaac, MD;  Location: [facility];  Service: Thoracic;  Laterality: Right;  Marland Kitchen VIDEO BRONCHOSCOPY WITH ENDOBRONCHIAL ULTRASOUND N/A 01/27/2015   Procedure: VIDEO BRONCHOSCOPY WITH ENDOBRONCHIAL ULTRASOUND;  Surgeon: Grace Isaac, MD;  Location: [facility];  Service: Thoracic;  Laterality: N/A;    REVIEW OF SYSTEMS:  Constitutional: negative Eyes: negative Ears, nose, mouth, throat, and face: negative Respiratory: positive for dyspnea on exertion Cardiovascular: negative Gastrointestinal: negative Genitourinary:negative Integument/breast: negative Hematologic/lymphatic: negative Musculoskeletal:negative Neurological: negative Behavioral/Psych: negative Endocrine: negative Allergic/Immunologic: negative   PHYSICAL EXAMINATION: General appearance: alert, cooperative and no distress Head: Normocephalic, without obvious abnormality, atraumatic Neck: no adenopathy, no JVD, supple, symmetrical, trachea midline and thyroid not enlarged, symmetric, no tenderness/mass/nodules Lymph nodes: Cervical, supraclavicular, and axillary nodes normal. Resp: clear to auscultation bilaterally Back: symmetric, no curvature. ROM normal. No CVA tenderness. Cardio: regular rate and rhythm, S1, S2 normal, no murmur, click, rub  or gallop GI: soft, non-tender; bowel sounds normal; no masses,  no organomegaly Extremities: extremities normal, atraumatic, no cyanosis or edema Neurologic: Alert and oriented X 3, normal strength and tone. Normal symmetric reflexes. Normal coordination and gait  ECOG PERFORMANCE STATUS: 1 - Symptomatic but completely ambulatory  Blood pressure 110/63, pulse 98, temperature 98.2 F (36.8 C), temperature source Temporal, resp. rate 17, height 5\' 11"  (1.803 m), weight 151 lb 1 oz (68.5 kg), SpO2 100 %.  LABORATORY DATA: Lab Results  Component Value Date   WBC 6.0 12/25/2018   HGB 13.3 12/25/2018   HCT 42.3 12/25/2018   MCV 87.2 12/25/2018   PLT 200 12/25/2018      Chemistry      Component Value Date/Time   NA 138 12/25/2018 1151   NA 140 02/08/2017 0822   K 4.9 12/25/2018 1151   K 4.5 02/08/2017 0822   CL 103 12/25/2018 1151   CO2 22 12/25/2018 1151   CO2 25 02/08/2017 0822   BUN 9 12/25/2018 1151   BUN 8.8 02/08/2017 0822   CREATININE 1.31 (H) 12/25/2018 1151   CREATININE 1.1 02/08/2017 2330  Component Value Date/Time   CALCIUM 9.7 12/25/2018 1151   CALCIUM 9.6 02/08/2017 0822   ALKPHOS 62 12/25/2018 1151   ALKPHOS 53 02/08/2017 0822   AST 20 12/25/2018 1151   AST 19 02/08/2017 0822   ALT 9 12/25/2018 1151   ALT 9 02/08/2017 0822   BILITOT 0.3 12/25/2018 1151   BILITOT <0.22 02/08/2017 1443       RADIOGRAPHIC STUDIES: Ct Chest W Contrast  Result Date: 12/16/2018 CLINICAL DATA:  Non-small-cell lung cancer. EXAM: CT CHEST WITH CONTRAST TECHNIQUE: Multidetector CT imaging of the chest was performed during intravenous contrast administration. CONTRAST:  64mL OMNIPAQUE IOHEXOL 300 MG/ML  SOLN COMPARISON:  06/17/2018 FINDINGS: Cardiovascular: The heart size is normal. No substantial pericardial effusion. Coronary artery calcification is evident. Right Port-A-Cath tip is in the mid to lower right atrium. Mediastinum/Nodes: 9 mm short axis precarinal node measured  previously is 10 mm today. No left hilar lymphadenopathy. Similar appearance of abnormal soft tissue in the upper right hilum. Lungs/Pleura: Rim enhancing lesion in the parahilar aspect of the medial right upper lobe has increased in size in the interval measuring 3.7 x 2.8 cm today compared to 2.9 x 2.9 cm previously. Loculated pleural fluid in the upper right hemithorax 11 is similar to prior. Treatment changes in the parahilar right lung are similar as is the volume loss in the right hemithorax. In paraseptal emphysema noted bilaterally. 5 mm left upper lobe nodule noted previously has progressed in the interval measuring 9 mm on 78/5 today. Upper Abdomen: Scattered tiny hypodensities in the liver parenchyma are stable, too small to characterize but likely benign. Adrenal glands incompletely visualized without nodule or mass evident. Musculoskeletal: No worrisome lytic or sclerotic osseous abnormality. IMPRESSION: 1. Interval progression of the rim enhancing lesion in the medial right upper lobe, concerning for disease progression. 2. Continued further enlargement of the left upper lobe pulmonary nodule now measuring 9 mm. Imaging features highly suspicious for metastatic disease although second primary lesion not excluded. 3.  Emphysema. ([id number]) Electronically Signed   By: Misty Stanley M.D.   On: 12/16/2018 10:36   Nm Pet Image Restag (ps) Skull Base To Thigh  Result Date: 12/25/2018 CLINICAL DATA:  Subsequent treatment strategy for non-small-cell lung cancer, restaging. Right upper lobe primary. EXAM: NUCLEAR MEDICINE PET SKULL BASE TO THIGH TECHNIQUE: 7.5 mCi F-18 FDG was injected intravenously. Full-ring PET imaging was performed from the skull base to thigh after the radiotracer. CT data was obtained and used for attenuation correction and anatomic localization. Fasting blood glucose: 72 mg/dl COMPARISON:  Chest CT 12/16/2018.  Most recent PET 02/08/2015. FINDINGS: Mediastinal blood pool activity:  SUV max 2.5 Liver activity: SUV max NA NECK: No areas of abnormal hypermetabolism. Incidental CT findings: No cervical adenopathy. CHEST: Low-level hypermetabolism corresponding to the medial right apical radiation induced consolidation. The area of more masslike anterior consolidation with peripheral enhancement on the prior contrast enhanced CT corresponds to hypermetabolism and central necrosis. This measures on the order of 4.0 x 2.7 cm and a S.U.V. max of 23.4, including on 64/4. Hypermetabolism within the superior aspect of the right atrium could be physiologic and relate to radiopharmaceutical injection via the Port-A-Cath. No convincing evidence of thoracic nodal hypermetabolism. Left upper lobe pulmonary nodule measures 7 mm and a S.U.V. max of 11.5 on image 77/4. Incidental CT findings: Right Port-A-Cath tip at high right atrium. Aortic and lad coronary artery atherosclerosis. Centrilobular and paraseptal emphysema. ABDOMEN/PELVIS: No abdominopelvic parenchymal or nodal hypermetabolism. Incidental CT findings:  Normal adrenal glands. Abdominal aortic atherosclerosis. Prior ventral pelvic wall hernia repair, eccentric right. Mild prostatomegaly. SKELETON: No abnormal marrow activity. There is subtle hypermetabolism within the posterior aspect of the spinal canal at approximately the T10 through T12 level. Example at a S.U.V. max of 3.4. No correlate in this area on the recent chest CT. Incidental CT findings: none IMPRESSION: 1. Marked hypermetabolism corresponding to right anterior apical mass, in the setting of radiation change. Findings are most consistent with residual/recurrent disease. 2. Left upper lobe hypermetabolic pulmonary nodule, favored to represent a metachronous primary. Isolated pulmonary metastasis could look similar. 3. No evidence of subdiaphragmatic hypermetabolic metastasis. 4. Likely physiologic hypermetabolism about the posterior aspect of the lower thoracic canal. If any symptoms to  suggest cord pathology, consider pre and postcontrast thoracic spine MRI. 5. Aortic atherosclerosis (ICD10-I70.0), coronary artery atherosclerosis and emphysema (ICD10-J43.9). Electronically Signed   By: Abigail Miyamoto M.D.   On: 12/25/2018 15:49    ASSESSMENT AND PLAN:  This is a very pleasant 73 years old African-American male with a stage IIIA non-small cell lung cancer, squamous cell carcinoma status post concurrent chemoradiation followed by consolidation chemotherapy. The patient has been in observation for close to 3 years. The patient is doing fine today with no concerning complaints.  The recent imaging studies including CT scan of the chest followed by a PET scan showed market hypermetabolism corresponding to the right upper lobe apical mass and the findings are consistent with residual residual/recurrent disease.  The scan also showed left upper lobe hypermetabolic pulmonary nodule favored to represent metachronous primary or isolated metastasis. I personally and independently reviewed the scan images and discussed the results with the patient today.  I will also call his sister with the results as well as recommendation regarding treatment of his condition.  I discussed with the patient treatment with systemic chemotherapy with carboplatin for AUC of 5, paclitaxel 170 mg/M2 as well as combination of immunotherapy likely with nivolumab 360 mg IV every 3 weeks and ipilimumab 1 mg/KG every 6 weeks.  The chemotherapy will be given only for 2 cycles and then the patient will be maintained on immunotherapy with nivolumab and ipilimumab for a total of 2 years if he has no evidence for disease progression or unacceptable toxicity. I discussed with the patient the adverse effect of this treatment including but not limited to alopecia, myelosuppression, nausea and vomiting, peripheral neuropathy, liver or renal dysfunction.  I also discussed with him the adverse effect of the immunotherapy. The patient is  expected to start the first cycle of this treatment next week. He will come back for follow-up visit in 2 weeks for evaluation and management of any adverse effect of his treatment.   Disclaimer: This note was dictated with voice recognition software. Similar sounding words can inadvertently be transcribed and may not be corrected upon review.

## 2019-01-02 ENCOUNTER — Telehealth: Payer: Self-pay | Admitting: Internal Medicine

## 2019-01-02 ENCOUNTER — Other Ambulatory Visit: Payer: Self-pay | Admitting: Internal Medicine

## 2019-01-02 NOTE — Telephone Encounter (Signed)
Spoke with sister re next appointments for 9/2 and 9/9. Remaining schedule for September thru December 2nd mailed.   Appointments scheduled according to instruction from provider, nivolumab - q3w, yervoy - q6w and carbo/taxol q3w week but for only 1st two cycles. Spoke with pharmacy re duration.

## 2019-01-02 NOTE — Telephone Encounter (Signed)
Care plan entered. Patient will have injection two days after 9/2 and 9/23 infusions. Updated September calendar included in mail out.

## 2019-01-08 ENCOUNTER — Inpatient Hospital Stay: Payer: Medicare Other | Attending: Internal Medicine

## 2019-01-08 ENCOUNTER — Other Ambulatory Visit: Payer: Self-pay

## 2019-01-08 ENCOUNTER — Inpatient Hospital Stay: Payer: Medicare Other

## 2019-01-08 ENCOUNTER — Other Ambulatory Visit: Payer: Self-pay | Admitting: Medical Oncology

## 2019-01-08 VITALS — BP 122/72 | HR 89 | Temp 98.3°F | Resp 17

## 2019-01-08 DIAGNOSIS — R5382 Chronic fatigue, unspecified: Secondary | ICD-10-CM

## 2019-01-08 DIAGNOSIS — Z79899 Other long term (current) drug therapy: Secondary | ICD-10-CM | POA: Diagnosis not present

## 2019-01-08 DIAGNOSIS — J449 Chronic obstructive pulmonary disease, unspecified: Secondary | ICD-10-CM

## 2019-01-08 DIAGNOSIS — D701 Agranulocytosis secondary to cancer chemotherapy: Secondary | ICD-10-CM | POA: Diagnosis not present

## 2019-01-08 DIAGNOSIS — C3491 Malignant neoplasm of unspecified part of right bronchus or lung: Secondary | ICD-10-CM

## 2019-01-08 DIAGNOSIS — Z5112 Encounter for antineoplastic immunotherapy: Secondary | ICD-10-CM | POA: Diagnosis not present

## 2019-01-08 DIAGNOSIS — Z5111 Encounter for antineoplastic chemotherapy: Secondary | ICD-10-CM | POA: Insufficient documentation

## 2019-01-08 DIAGNOSIS — Z5189 Encounter for other specified aftercare: Secondary | ICD-10-CM | POA: Insufficient documentation

## 2019-01-08 DIAGNOSIS — Z23 Encounter for immunization: Secondary | ICD-10-CM | POA: Insufficient documentation

## 2019-01-08 DIAGNOSIS — C3411 Malignant neoplasm of upper lobe, right bronchus or lung: Secondary | ICD-10-CM | POA: Diagnosis not present

## 2019-01-08 LAB — CMP (CANCER CENTER ONLY)
ALT: 8 U/L (ref 0–44)
AST: 17 U/L (ref 15–41)
Albumin: 3.7 g/dL (ref 3.5–5.0)
Alkaline Phosphatase: 50 U/L (ref 38–126)
Anion gap: 10 (ref 5–15)
BUN: 8 mg/dL (ref 8–23)
CO2: 24 mmol/L (ref 22–32)
Calcium: 8.9 mg/dL (ref 8.9–10.3)
Chloride: 106 mmol/L (ref 98–111)
Creatinine: 1.05 mg/dL (ref 0.61–1.24)
GFR, Est AFR Am: >60 mL/min (ref >60)
GFR, Estimated: >60 mL/min (ref >60)
Glucose, Bld: 109 mg/dL — ABNORMAL HIGH (ref 70–99)
Potassium: 3.6 mmol/L (ref 3.5–5.1)
Sodium: 140 mmol/L (ref 135–145)
Total Bilirubin: <0.2 mg/dL — ABNORMAL LOW (ref 0.3–1.2)
Total Protein: 7.6 g/dL (ref 6.5–8.1)

## 2019-01-08 LAB — CBC WITH DIFFERENTIAL (CANCER CENTER ONLY)
Abs Immature Granulocytes: 0.01 10*3/uL (ref 0.00–0.07)
Basophils Absolute: 0 10*3/uL (ref 0.0–0.1)
Basophils Relative: 0 %
Eosinophils Absolute: 0.1 10*3/uL (ref 0.0–0.5)
Eosinophils Relative: 1 %
HCT: 37.5 % — ABNORMAL LOW (ref 39.0–52.0)
Hemoglobin: 12 g/dL — ABNORMAL LOW (ref 13.0–17.0)
Immature Granulocytes: 0 %
Lymphocytes Relative: 26 %
Lymphs Abs: 1.2 10*3/uL (ref 0.7–4.0)
MCH: 27.6 pg (ref 26.0–34.0)
MCHC: 32 g/dL (ref 30.0–36.0)
MCV: 86.2 fL (ref 80.0–100.0)
Monocytes Absolute: 0.4 10*3/uL (ref 0.1–1.0)
Monocytes Relative: 9 %
Neutro Abs: 3.1 10*3/uL (ref 1.7–7.7)
Neutrophils Relative %: 64 %
Platelet Count: 213 10*3/uL (ref 150–400)
RBC: 4.35 MIL/uL (ref 4.22–5.81)
RDW: 16.7 % — ABNORMAL HIGH (ref 11.5–15.5)
WBC Count: 4.8 10*3/uL (ref 4.0–10.5)
nRBC: 0 % (ref 0.0–0.2)

## 2019-01-08 LAB — TSH: TSH: 3.628 u[IU]/mL (ref 0.320–4.118)

## 2019-01-08 MED ORDER — HEPARIN SOD (PORK) LOCK FLUSH 100 UNIT/ML IV SOLN
500.0000 [IU] | Freq: Once | INTRAVENOUS | Status: AC | PRN
  Administered 2019-01-08: 16:00:00 500 [IU]
  Filled 2019-01-08: qty 5

## 2019-01-08 MED ORDER — PALONOSETRON HCL INJECTION 0.25 MG/5ML
INTRAVENOUS | Status: AC
  Filled 2019-01-08: qty 5

## 2019-01-08 MED ORDER — FAMOTIDINE IN NACL 20-0.9 MG/50ML-% IV SOLN
20.0000 mg | Freq: Once | INTRAVENOUS | Status: AC
  Administered 2019-01-08: 09:00:00 20 mg via INTRAVENOUS

## 2019-01-08 MED ORDER — FAMOTIDINE IN NACL 20-0.9 MG/50ML-% IV SOLN
INTRAVENOUS | Status: AC
  Filled 2019-01-08: qty 50

## 2019-01-08 MED ORDER — DIPHENHYDRAMINE HCL 50 MG/ML IJ SOLN
INTRAMUSCULAR | Status: AC
  Filled 2019-01-08: qty 1

## 2019-01-08 MED ORDER — SODIUM CHLORIDE 0.9 % IV SOLN
Freq: Once | INTRAVENOUS | Status: AC
  Administered 2019-01-08: 09:00:00 via INTRAVENOUS
  Filled 2019-01-08: qty 250

## 2019-01-08 MED ORDER — SODIUM CHLORIDE 0.9 % IV SOLN
Freq: Once | INTRAVENOUS | Status: AC
  Administered 2019-01-08: 09:00:00 via INTRAVENOUS
  Filled 2019-01-08: qty 5

## 2019-01-08 MED ORDER — SODIUM CHLORIDE 0.9 % IV SOLN: 375.5000 mg | Freq: Once | INTRAVENOUS | Status: DC

## 2019-01-08 MED ORDER — SODIUM CHLORIDE 0.9 % IV SOLN
1.0000 mg/kg | Freq: Once | INTRAVENOUS | Status: AC
  Administered 2019-01-08: 11:00:00 70 mg via INTRAVENOUS
  Filled 2019-01-08: qty 14

## 2019-01-08 MED ORDER — SODIUM CHLORIDE 0.9% FLUSH
10.0000 mL | INTRAVENOUS | Status: DC | PRN
  Administered 2019-01-08: 16:00:00 10 mL
  Filled 2019-01-08: qty 10

## 2019-01-08 MED ORDER — SODIUM CHLORIDE 0.9% FLUSH
10.0000 mL | INTRAVENOUS | Status: DC | PRN
  Administered 2019-01-08: 10 mL
  Filled 2019-01-08: qty 10

## 2019-01-08 MED ORDER — SODIUM CHLORIDE 0.9 % IV SOLN
175.0000 mg/m2 | Freq: Once | INTRAVENOUS | Status: AC
  Administered 2019-01-08: 11:00:00 324 mg via INTRAVENOUS
  Filled 2019-01-08: qty 54

## 2019-01-08 MED ORDER — SODIUM CHLORIDE 0.9 % IV SOLN
360.0000 mg | Freq: Once | INTRAVENOUS | Status: AC
  Administered 2019-01-08: 10:00:00 360 mg via INTRAVENOUS
  Filled 2019-01-08: qty 24

## 2019-01-08 MED ORDER — ONDANSETRON HCL 8 MG PO TABS: 8.0000 mg | ORAL_TABLET | Freq: Three times a day (TID) | ORAL | 0 refills | Status: AC | PRN

## 2019-01-08 MED ORDER — DIPHENHYDRAMINE HCL 50 MG/ML IJ SOLN
50.0000 mg | Freq: Once | INTRAMUSCULAR | Status: AC
  Administered 2019-01-08: 09:00:00 50 mg via INTRAVENOUS

## 2019-01-08 MED ORDER — PALONOSETRON HCL INJECTION 0.25 MG/5ML
0.2500 mg | Freq: Once | INTRAVENOUS | Status: AC
  Administered 2019-01-08: 09:00:00 0.25 mg via INTRAVENOUS

## 2019-01-08 MED ORDER — SODIUM CHLORIDE 0.9 % IV SOLN
437.5000 mg | Freq: Once | INTRAVENOUS | Status: AC
  Administered 2019-01-08: 440 mg via INTRAVENOUS
  Filled 2019-01-08: qty 44

## 2019-01-08 NOTE — Progress Notes (Signed)
Adjust dose of carboplatin today for improved renal function per Dr. Julien Nordmann.   Demetrius Charity, PharmD, [facility] Phone: [phone] 01/08/2019

## 2019-01-08 NOTE — Patient Instructions (Signed)
[facility] Discharge Instructions for Patients Receiving Chemotherapy  Today you received the following chemotherapy agents Opdivo, Yervoy, Taxol and Carboplatin  To help prevent nausea and vomiting after your treatment, we encourage you to take your nausea medication as prescribed.    If you develop nausea and vomiting that is not controlled by your nausea medication, call the clinic.   BELOW ARE SYMPTOMS THAT SHOULD BE REPORTED IMMEDIATELY:  *FEVER GREATER THAN 100.5 F  *CHILLS WITH OR WITHOUT FEVER  NAUSEA AND VOMITING THAT IS NOT CONTROLLED WITH YOUR NAUSEA MEDICATION  *UNUSUAL SHORTNESS OF BREATH  *UNUSUAL BRUISING OR BLEEDING  TENDERNESS IN MOUTH AND THROAT WITH OR WITHOUT PRESENCE OF ULCERS  *URINARY PROBLEMS  *BOWEL PROBLEMS  UNUSUAL RASH Items with * indicate a potential emergency and should be followed up as soon as possible.  Feel free to call the clinic should you have any questions or concerns. The clinic phone number is [phone].  Please show the [place] at check-in to the Emergency Department and triage nurse.  Nivolumab injection(Opdivo) What is this medicine? NIVOLUMAB (nye VOL ue mab) is a monoclonal antibody. It is used to treat melanoma, lung cancer, kidney cancer, head and neck cancer, Hodgkin lymphoma, urothelial cancer, colon cancer, and liver cancer. This medicine may be used for other purposes; ask your health care provider or pharmacist if you have questions. COMMON BRAND NAME(S): Opdivo What should I tell my health care provider before I take this medicine? They need to know if you have any of these conditions:  diabetes  immune system problems  kidney disease  liver disease  lung disease  organ transplant  stomach or intestine problems  thyroid disease  an unusual or allergic reaction to nivolumab, other medicines, foods, dyes, or preservatives  pregnant or trying to get  pregnant  breast-feeding How should I use this medicine? This medicine is for infusion into a vein. It is given by a health care professional in a hospital or clinic setting. A special MedGuide will be given to you before each treatment. Be sure to read this information carefully each time. Talk to your pediatrician regarding the use of this medicine in children. While this drug may be prescribed for children as young as 12 years for selected conditions, precautions do apply. Overdosage: If you think you have taken too much of this medicine contact a poison control center or emergency room at once. NOTE: This medicine is only for you. Do not share this medicine with others. What if I miss a dose? It is important not to miss your dose. Call your doctor or health care professional if you are unable to keep an appointment. What may interact with this medicine? Interactions have not been studied. Give your health care provider a list of all the medicines, herbs, non-prescription drugs, or dietary supplements you use. Also tell them if you smoke, drink alcohol, or use illegal drugs. Some items may interact with your medicine. This list may not describe all possible interactions. Give your health care provider a list of all the medicines, herbs, non-prescription drugs, or dietary supplements you use. Also tell them if you smoke, drink alcohol, or use illegal drugs. Some items may interact with your medicine. What should I watch for while using this medicine? This drug may make you feel generally unwell. Continue your course of treatment even though you feel ill unless your doctor tells you to stop. You may need blood work done while you are taking  this medicine. Do not become pregnant while taking this medicine or for 5 months after stopping it. Women should inform their doctor if they wish to become pregnant or think they might be pregnant. There is a potential for serious side effects to an unborn  child. Talk to your health care professional or pharmacist for more information. Do not breast-feed an infant while taking this medicine or for 5 months after stopping it. What side effects may I notice from receiving this medicine? Side effects that you should report to your doctor or health care professional as soon as possible:  allergic reactions like skin rash, itching or hives, swelling of the face, lips, or tongue  breathing problems  blood in the urine  bloody or watery diarrhea or black, tarry stools  changes in emotions or moods  changes in vision  chest pain  cough  dizziness  feeling faint or lightheaded, falls  fever, chills  headache with fever, neck stiffness, confusion, loss of memory, sensitivity to light, hallucination, loss of contact with reality, or seizures  joint pain  mouth sores  redness, blistering, peeling or loosening of the skin, including inside the mouth  severe muscle pain or weakness  signs and symptoms of high blood sugar such as dizziness; dry mouth; dry skin; fruity breath; nausea; stomach pain; increased hunger or thirst; increased urination  signs and symptoms of kidney injury like trouble passing urine or change in the amount of urine  signs and symptoms of liver injury like dark yellow or brown urine; general ill feeling or flu-like symptoms; light-colored stools; loss of appetite; nausea; right upper belly pain; unusually weak or tired; yellowing of the eyes or skin  swelling of the ankles, feet, hands  trouble passing urine or change in the amount of urine  unusually weak or tired  weight gain or loss Side effects that usually do not require medical attention (report to your doctor or health care professional if they continue or are bothersome):  bone pain  constipation  decreased appetite  diarrhea  muscle pain  nausea, vomiting  tiredness This list may not describe all possible side effects. Call your doctor  for medical advice about side effects. You may report side effects to FDA at 1-800-FDA-1088. Where should I keep my medicine? This drug is given in a hospital or clinic and will not be stored at home. NOTE: This sheet is a summary. It may not cover all possible information. If you have questions about this medicine, talk to your doctor, pharmacist, or health care provider.  2020 Elsevier/Gold Standard (2017-09-12 12:55:04)  Ipilimumab injection(Yervoy) What is this medicine? IPILIMUMAB (IP i LIM ue mab) is a monoclonal antibody. It is used to treat melanoma, colorectal cancer, liver cancer, and kidney cancer. This medicine may be used for other purposes; ask your health care provider or pharmacist if you have questions. COMMON BRAND NAME(S): YERVOY What should I tell my health care provider before I take this medicine? They need to know if you have any of these conditions:  Addison's disease  blood in your stools (black or tarry stools) or if you have blood in your vomit  eye disease, vision problems  history of pancreatitis  history of stomach bleeding  immune system problems  inflammatory bowel disease  kidney disease  liver disease  lupus  myasthenia gravis  organ transplant  rheumatoid arthritis  sarcoidosis  stomach or intestine problems  thyroid disease  tingling of the fingers or toes, or other nerve disorder  an unusual or allergic reaction to ipilimumab, other medicines, foods, dyes, or preservatives  pregnant or trying to get pregnant  breast-feeding How should I use this medicine? This medicine is for infusion into a vein. It is given by a health care professional in a hospital or clinic setting. A special MedGuide will be given to you before each treatment. Be sure to read this information carefully each time. Talk to your pediatrician regarding the use of this medicine in children. While this drug may be prescribed for children as young as 12  years for selected conditions, precautions do apply. Overdosage: If you think you have taken too much of this medicine contact a poison control center or emergency room at once. NOTE: This medicine is only for you. Do not share this medicine with others. What if I miss a dose? It is important not to miss your dose. Call your doctor or health care professional if you are unable to keep an appointment. What may interact with this medicine? Interactions are not expected. This list may not describe all possible interactions. Give your health care provider a list of all the medicines, herbs, non-prescription drugs, or dietary supplements you use. Also tell them if you smoke, drink alcohol, or use illegal drugs. Some items may interact with your medicine. What should I watch for while using this medicine? Tell your doctor or healthcare professional if your symptoms do not start to get better or if they get worse. Do not become pregnant while taking this medicine or for 3 months after stopping it. Women should inform their doctor if they wish to become pregnant or think they might be pregnant. There is a potential for serious side effects to an unborn child. Talk to your health care professional or pharmacist for more information. Do not breast-feed an infant while taking this medicine or for 3 months after the last dose. Your condition will be monitored carefully while you are receiving this medicine. You may need blood work done while you are taking this medicine. What side effects may I notice from receiving this medicine? Side effects that you should report to your doctor or health care professional as soon as possible:  allergic reactions like skin rash, itching or hives, swelling of the face, lips, or tongue  black, tarry stools  bloody or watery diarrhea  changes in vision  dizziness  eye pain  fast, irregular heartbeat  feeling anxious  feeling faint or lightheaded, falls  nausea,  vomiting  pain, tingling, numbness in the hands or feet  redness, blistering, peeling or loosening of the skin, including inside the mouth  signs and symptoms of liver injury like dark yellow or brown urine; general ill feeling or flu-like symptoms; light-colored stools; loss of appetite; nausea; right upper belly pain; unusually weak or tired; yellowing of the eyes or skin  unusual bleeding or bruising Side effects that usually do not require medical attention (report to your doctor or health care professional if they continue or are bothersome):  headache  loss of appetite  trouble sleeping This list may not describe all possible side effects. Call your doctor for medical advice about side effects. You may report side effects to FDA at 1-800-FDA-1088. Where should I keep my medicine? This drug is given in a hospital or clinic and will not be stored at home. NOTE: This sheet is a summary. It may not cover all possible information. If you have questions about this medicine, talk to your doctor, pharmacist, or health  care provider.  2020 Elsevier/Gold Standard (2018-07-23 09:21:38)  Paclitaxel injection(Taxol) What is this medicine? PACLITAXEL (PAK li TAX el) is a chemotherapy drug. It targets fast dividing cells, like cancer cells, and causes these cells to die. This medicine is used to treat ovarian cancer, breast cancer, lung cancer, Kaposi's sarcoma, and other cancers. This medicine may be used for other purposes; ask your health care provider or pharmacist if you have questions. COMMON BRAND NAME(S): Onxol, Taxol What should I tell my health care provider before I take this medicine? They need to know if you have any of these conditions:  history of irregular heartbeat  liver disease  low blood counts, like low white cell, platelet, or red cell counts  lung or breathing disease, like asthma  tingling of the fingers or toes, or other nerve disorder  an unusual or allergic  reaction to paclitaxel, alcohol, polyoxyethylated castor oil, other chemotherapy, other medicines, foods, dyes, or preservatives  pregnant or trying to get pregnant  breast-feeding How should I use this medicine? This drug is given as an infusion into a vein. It is administered in a hospital or clinic by a specially trained health care professional. Talk to your pediatrician regarding the use of this medicine in children. Special care may be needed. Overdosage: If you think you have taken too much of this medicine contact a poison control center or emergency room at once. NOTE: This medicine is only for you. Do not share this medicine with others. What if I miss a dose? It is important not to miss your dose. Call your doctor or health care professional if you are unable to keep an appointment. What may interact with this medicine? Do not take this medicine with any of the following medications:  disulfiram  metronidazole This medicine may also interact with the following medications:  antiviral medicines for hepatitis, HIV or AIDS  certain antibiotics like erythromycin and clarithromycin  certain medicines for fungal infections like ketoconazole and itraconazole  certain medicines for seizures like carbamazepine, phenobarbital, phenytoin  gemfibrozil  nefazodone  rifampin  St. John's wort This list may not describe all possible interactions. Give your health care provider a list of all the medicines, herbs, non-prescription drugs, or dietary supplements you use. Also tell them if you smoke, drink alcohol, or use illegal drugs. Some items may interact with your medicine. What should I watch for while using this medicine? Your condition will be monitored carefully while you are receiving this medicine. You will need important blood work done while you are taking this medicine. This medicine can cause serious allergic reactions. To reduce your risk you will need to take other  medicine(s) before treatment with this medicine. If you experience allergic reactions like skin rash, itching or hives, swelling of the face, lips, or tongue, tell your doctor or health care professional right away. In some cases, you may be given additional medicines to help with side effects. Follow all directions for their use. This drug may make you feel generally unwell. This is not uncommon, as chemotherapy can affect healthy cells as well as cancer cells. Report any side effects. Continue your course of treatment even though you feel ill unless your doctor tells you to stop. Call your doctor or health care professional for advice if you get a fever, chills or sore throat, or other symptoms of a cold or flu. Do not treat yourself. This drug decreases your body's ability to fight infections. Try to avoid being around people who  are sick. This medicine may increase your risk to bruise or bleed. Call your doctor or health care professional if you notice any unusual bleeding. Be careful brushing and flossing your teeth or using a toothpick because you may get an infection or bleed more easily. If you have any dental work done, tell your dentist you are receiving this medicine. Avoid taking products that contain aspirin, acetaminophen, ibuprofen, naproxen, or ketoprofen unless instructed by your doctor. These medicines may hide a fever. Do not become pregnant while taking this medicine. Women should inform their doctor if they wish to become pregnant or think they might be pregnant. There is a potential for serious side effects to an unborn child. Talk to your health care professional or pharmacist for more information. Do not breast-feed an infant while taking this medicine. Men are advised not to father a child while receiving this medicine. This product may contain alcohol. Ask your pharmacist or healthcare provider if this medicine contains alcohol. Be sure to tell all healthcare providers you are  taking this medicine. Certain medicines, like metronidazole and disulfiram, can cause an unpleasant reaction when taken with alcohol. The reaction includes flushing, headache, nausea, vomiting, sweating, and increased thirst. The reaction can last from 30 minutes to several hours. What side effects may I notice from receiving this medicine? Side effects that you should report to your doctor or health care professional as soon as possible:  allergic reactions like skin rash, itching or hives, swelling of the face, lips, or tongue  breathing problems  changes in vision  fast, irregular heartbeat  high or low blood pressure  mouth sores  pain, tingling, numbness in the hands or feet  signs of decreased platelets or bleeding - bruising, pinpoint red spots on the skin, black, tarry stools, blood in the urine  signs of decreased red blood cells - unusually weak or tired, feeling faint or lightheaded, falls  signs of infection - fever or chills, cough, sore throat, pain or difficulty passing urine  signs and symptoms of liver injury like dark yellow or brown urine; general ill feeling or flu-like symptoms; light-colored stools; loss of appetite; nausea; right upper belly pain; unusually weak or tired; yellowing of the eyes or skin  swelling of the ankles, feet, hands  unusually slow heartbeat Side effects that usually do not require medical attention (report to your doctor or health care professional if they continue or are bothersome):  diarrhea  hair loss  loss of appetite  muscle or joint pain  nausea, vomiting  pain, redness, or irritation at site where injected  tiredness This list may not describe all possible side effects. Call your doctor for medical advice about side effects. You may report side effects to FDA at 1-800-FDA-1088. Where should I keep my medicine? This drug is given in a hospital or clinic and will not be stored at home. NOTE: This sheet is a summary. It  may not cover all possible information. If you have questions about this medicine, talk to your doctor, pharmacist, or health care provider.  2020 Elsevier/Gold Standard (2016-12-26 13:14:55)  Carboplatin injection What is this medicine? CARBOPLATIN (KAR boe pla tin) is a chemotherapy drug. It targets fast dividing cells, like cancer cells, and causes these cells to die. This medicine is used to treat ovarian cancer and many other cancers. This medicine may be used for other purposes; ask your health care provider or pharmacist if you have questions. COMMON BRAND NAME(S): Paraplatin What should I tell my  health care provider before I take this medicine? They need to know if you have any of these conditions:  blood disorders  hearing problems  kidney disease  recent or ongoing radiation therapy  an unusual or allergic reaction to carboplatin, cisplatin, other chemotherapy, other medicines, foods, dyes, or preservatives  pregnant or trying to get pregnant  breast-feeding How should I use this medicine? This drug is usually given as an infusion into a vein. It is administered in a hospital or clinic by a specially trained health care professional. Talk to your pediatrician regarding the use of this medicine in children. Special care may be needed. Overdosage: If you think you have taken too much of this medicine contact a poison control center or emergency room at once. NOTE: This medicine is only for you. Do not share this medicine with others. What if I miss a dose? It is important not to miss a dose. Call your doctor or health care professional if you are unable to keep an appointment. What may interact with this medicine?  medicines for seizures  medicines to increase blood counts like filgrastim, pegfilgrastim, sargramostim  some antibiotics like amikacin, gentamicin, neomycin, streptomycin, tobramycin  vaccines Talk to your doctor or health care professional before taking  any of these medicines:  acetaminophen  aspirin  ibuprofen  ketoprofen  naproxen This list may not describe all possible interactions. Give your health care provider a list of all the medicines, herbs, non-prescription drugs, or dietary supplements you use. Also tell them if you smoke, drink alcohol, or use illegal drugs. Some items may interact with your medicine. What should I watch for while using this medicine? Your condition will be monitored carefully while you are receiving this medicine. You will need important blood work done while you are taking this medicine. This drug may make you feel generally unwell. This is not uncommon, as chemotherapy can affect healthy cells as well as cancer cells. Report any side effects. Continue your course of treatment even though you feel ill unless your doctor tells you to stop. In some cases, you may be given additional medicines to help with side effects. Follow all directions for their use. Call your doctor or health care professional for advice if you get a fever, chills or sore throat, or other symptoms of a cold or flu. Do not treat yourself. This drug decreases your body's ability to fight infections. Try to avoid being around people who are sick. This medicine may increase your risk to bruise or bleed. Call your doctor or health care professional if you notice any unusual bleeding. Be careful brushing and flossing your teeth or using a toothpick because you may get an infection or bleed more easily. If you have any dental work done, tell your dentist you are receiving this medicine. Avoid taking products that contain aspirin, acetaminophen, ibuprofen, naproxen, or ketoprofen unless instructed by your doctor. These medicines may hide a fever. Do not become pregnant while taking this medicine. Women should inform their doctor if they wish to become pregnant or think they might be pregnant. There is a potential for serious side effects to an unborn  child. Talk to your health care professional or pharmacist for more information. Do not breast-feed an infant while taking this medicine. What side effects may I notice from receiving this medicine? Side effects that you should report to your doctor or health care professional as soon as possible:  allergic reactions like skin rash, itching or hives, swelling of the  face, lips, or tongue  signs of infection - fever or chills, cough, sore throat, pain or difficulty passing urine  signs of decreased platelets or bleeding - bruising, pinpoint red spots on the skin, black, tarry stools, nosebleeds  signs of decreased red blood cells - unusually weak or tired, fainting spells, lightheadedness  breathing problems  changes in hearing  changes in vision  chest pain  high blood pressure  low blood counts - This drug may decrease the number of white blood cells, red blood cells and platelets. You may be at increased risk for infections and bleeding.  nausea and vomiting  pain, swelling, redness or irritation at the injection site  pain, tingling, numbness in the hands or feet  problems with balance, talking, walking  trouble passing urine or change in the amount of urine Side effects that usually do not require medical attention (report to your doctor or health care professional if they continue or are bothersome):  hair loss  loss of appetite  metallic taste in the mouth or changes in taste This list may not describe all possible side effects. Call your doctor for medical advice about side effects. You may report side effects to FDA at 1-800-FDA-1088. Where should I keep my medicine? This drug is given in a hospital or clinic and will not be stored at home. NOTE: This sheet is a summary. It may not cover all possible information. If you have questions about this medicine, talk to your doctor, pharmacist, or health care provider.  2020 Elsevier/Gold Standard (2007-07-30  14:38:05)

## 2019-01-08 NOTE — Progress Notes (Signed)
Completed [facility] Patient monitoring checklist 01/08/2019 all answers are no/ WDL. BAseline for pt is 1 bowel movement/day.

## 2019-01-09 ENCOUNTER — Telehealth: Payer: Self-pay | Admitting: *Deleted

## 2019-01-10 ENCOUNTER — Other Ambulatory Visit: Payer: Self-pay

## 2019-01-10 ENCOUNTER — Inpatient Hospital Stay: Payer: Medicare Other

## 2019-01-10 VITALS — BP 116/59 | Temp 98.5°F | Resp 16

## 2019-01-10 DIAGNOSIS — Z5189 Encounter for other specified aftercare: Secondary | ICD-10-CM | POA: Diagnosis not present

## 2019-01-10 DIAGNOSIS — C3491 Malignant neoplasm of unspecified part of right bronchus or lung: Secondary | ICD-10-CM

## 2019-01-10 DIAGNOSIS — Z23 Encounter for immunization: Secondary | ICD-10-CM | POA: Diagnosis not present

## 2019-01-10 DIAGNOSIS — Z5111 Encounter for antineoplastic chemotherapy: Secondary | ICD-10-CM | POA: Diagnosis not present

## 2019-01-10 DIAGNOSIS — Z5112 Encounter for antineoplastic immunotherapy: Secondary | ICD-10-CM | POA: Diagnosis not present

## 2019-01-10 DIAGNOSIS — C3411 Malignant neoplasm of upper lobe, right bronchus or lung: Secondary | ICD-10-CM | POA: Diagnosis not present

## 2019-01-10 DIAGNOSIS — D701 Agranulocytosis secondary to cancer chemotherapy: Secondary | ICD-10-CM | POA: Diagnosis not present

## 2019-01-10 MED ORDER — PEGFILGRASTIM-JMDB 6 MG/0.6ML ~~LOC~~ SOSY
6.0000 mg | PREFILLED_SYRINGE | Freq: Once | SUBCUTANEOUS | Status: AC
  Administered 2019-01-10: 6 mg via SUBCUTANEOUS
  Filled 2019-01-10: qty 0.6

## 2019-01-10 NOTE — Patient Instructions (Signed)

## 2019-01-15 ENCOUNTER — Other Ambulatory Visit: Payer: Self-pay

## 2019-01-15 ENCOUNTER — Inpatient Hospital Stay (HOSPITAL_BASED_OUTPATIENT_CLINIC_OR_DEPARTMENT_OTHER): Payer: Medicare Other | Admitting: Internal Medicine

## 2019-01-15 ENCOUNTER — Encounter: Payer: Self-pay | Admitting: Internal Medicine

## 2019-01-15 ENCOUNTER — Telehealth: Payer: Self-pay | Admitting: Internal Medicine

## 2019-01-15 ENCOUNTER — Inpatient Hospital Stay: Payer: Medicare Other

## 2019-01-15 VITALS — BP 122/69 | HR 117 | Temp 98.0°F | Resp 18 | Ht 71.0 in | Wt 148.6 lb

## 2019-01-15 DIAGNOSIS — Z5111 Encounter for antineoplastic chemotherapy: Secondary | ICD-10-CM

## 2019-01-15 DIAGNOSIS — Z5189 Encounter for other specified aftercare: Secondary | ICD-10-CM | POA: Diagnosis not present

## 2019-01-15 DIAGNOSIS — D701 Agranulocytosis secondary to cancer chemotherapy: Secondary | ICD-10-CM | POA: Diagnosis not present

## 2019-01-15 DIAGNOSIS — Z5112 Encounter for antineoplastic immunotherapy: Secondary | ICD-10-CM

## 2019-01-15 DIAGNOSIS — C3411 Malignant neoplasm of upper lobe, right bronchus or lung: Secondary | ICD-10-CM

## 2019-01-15 DIAGNOSIS — C3491 Malignant neoplasm of unspecified part of right bronchus or lung: Secondary | ICD-10-CM

## 2019-01-15 DIAGNOSIS — Z23 Encounter for immunization: Secondary | ICD-10-CM | POA: Diagnosis not present

## 2019-01-15 LAB — CBC WITH DIFFERENTIAL (CANCER CENTER ONLY)
Abs Immature Granulocytes: 0 10*3/uL (ref 0.00–0.07)
Basophils Absolute: 0 10*3/uL (ref 0.0–0.1)
Basophils Relative: 1 %
Eosinophils Absolute: 0 10*3/uL (ref 0.0–0.5)
Eosinophils Relative: 3 %
HCT: 37.4 % — ABNORMAL LOW (ref 39.0–52.0)
Hemoglobin: 12 g/dL — ABNORMAL LOW (ref 13.0–17.0)
Immature Granulocytes: 0 %
Lymphocytes Relative: 74 %
Lymphs Abs: 0.7 10*3/uL (ref 0.7–4.0)
MCH: 27.7 pg (ref 26.0–34.0)
MCHC: 32.1 g/dL (ref 30.0–36.0)
MCV: 86.4 fL (ref 80.0–100.0)
Monocytes Absolute: 0.1 10*3/uL (ref 0.1–1.0)
Monocytes Relative: 12 %
Neutro Abs: 0.1 10*3/uL — CL (ref 1.7–7.7)
Neutrophils Relative %: 10 %
Platelet Count: 129 10*3/uL — ABNORMAL LOW (ref 150–400)
RBC: 4.33 MIL/uL (ref 4.22–5.81)
RDW: 16.1 % — ABNORMAL HIGH (ref 11.5–15.5)
WBC Count: 1 10*3/uL — ABNORMAL LOW (ref 4.0–10.5)
nRBC: 0 % (ref 0.0–0.2)

## 2019-01-15 LAB — CMP (CANCER CENTER ONLY)
ALT: 11 U/L (ref 0–44)
AST: 17 U/L (ref 15–41)
Albumin: 4 g/dL (ref 3.5–5.0)
Alkaline Phosphatase: 53 U/L (ref 38–126)
Anion gap: 12 (ref 5–15)
BUN: 12 mg/dL (ref 8–23)
CO2: 27 mmol/L (ref 22–32)
Calcium: 9.6 mg/dL (ref 8.9–10.3)
Chloride: 99 mmol/L (ref 98–111)
Creatinine: 1.1 mg/dL (ref 0.61–1.24)
GFR, Est AFR Am: >60 mL/min (ref >60)
GFR, Estimated: >60 mL/min (ref >60)
Glucose, Bld: 107 mg/dL — ABNORMAL HIGH (ref 70–99)
Potassium: 4.6 mmol/L (ref 3.5–5.1)
Sodium: 138 mmol/L (ref 135–145)
Total Bilirubin: 0.4 mg/dL (ref 0.3–1.2)
Total Protein: 8.1 g/dL (ref 6.5–8.1)

## 2019-01-15 NOTE — Telephone Encounter (Signed)
Printed calender and AVS for pt - appts already on schedule per 9/9 los.

## 2019-01-15 NOTE — Progress Notes (Signed)
[facility] [phone]   Fax:([phone]  OFFICE PROGRESS NOTE  [name], FNP [address] 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  He has disease recurrence in August 2020.  PRIOR THERAPY:  1) Course of concurrent chemoradiation with weekly carboplatin for AUC of 2 and paclitaxel 45 MG/M2. First dose 03/01/2015. He is status post 6 cycles. Last cycle was given 04/12/2015 with partial response. 2) Consolidation chemotherapy with reduced dose carboplatin for AUC of 5 and paclitaxel 175 MG/M2 every 3 weeks, status post 3 cycles.  CURRENT THERAPY: Systemic chemotherapy with carboplatin for AUC of 5, paclitaxel 175 mg/M2 with nivolumab 360 mg IV every 3 weeks in addition to ipilimumab 1 mg/KG every 6 weeks.  First dose 01/08/2019.  INTERVAL HISTORY: Bernardo Brayman Linders 73 y.o. male returns to the clinic today for follow-up visit.  The patient tolerated the first week of his treatment fairly well with no concerning adverse effects.  He denied having any chest pain, shortness of breath, cough or hemoptysis.  He denied having any fever or chills.  He has no nausea, vomiting, diarrhea or constipation.  He denied having any headache or visual changes.  He has no recent weight loss or night sweats.  He is here today for evaluation and repeat blood work.  MEDICAL HISTORY: Past Medical History:  Diagnosis Date   ALCOHOL ABUSE 11/12/2009   Qualifier: Diagnosis of  By: Hassell Done FNP, [place]     Allergy    Anemia    Anxiety    BENIGN PROSTATIC HYPERTROPHY, HX OF 12/19/2006   Qualifier: Diagnosis of  By: Radene Ou MD, [place]     Blood transfusion without reported diagnosis  2017   Clotting disorder ([facility])    takes PLAVIX   Depression    Emphysema of lung ([facility])    Full code status 02/10/2015   GERD (gastroesophageal reflux disease)    Hyperlipidemia    Lung mass 01/25/2015   3.7 x 3.6 cm RUL spiculated lung mass with 2.0 cm right paratracheal lymph node suspicious for bronchogenic CA   Non-small cell lung cancer ([facility]) 01/27/15   non small-cell,squamous cell ca RUL   Paranoid schizophrenia ([facility]) 10/31/2009   Qualifier: Diagnosis of  By: Jorene Minors, Scott     Primary spontaneous pneumothorax, left 01/25/2015   TOBACCO ABUSE 05/24/2009   Qualifier: Diagnosis of  By: Hassell Done FNP, Tori Milks      ALLERGIES:  is allergic to benztropine mesylate and chlorpromazine hcl.  MEDICATIONS:  Current Outpatient Medications  Medication Sig Dispense Refill   ALPHAGAN P 0.1 % SOLN Place 1 drop into both eyes 3 (three) times daily.  6   aspirin EC 81 MG tablet Take 81 mg by mouth 2 (two) times daily.     benztropine (COGENTIN) 0.5 MG tablet Take 0.5 mg by mouth at bedtime.      chlorhexidine (PERIDEX) 0.12 % solution 1 mL by Mouth Rinse route as needed.   1   clopidogrel (PLAVIX) 75 MG tablet Take 75 mg by mouth daily.      ferrous fumarate (HEMOCYTE - 106 MG FE) 325 (106 FE) MG TABS tablet Take 1 tablet by mouth daily.      haloperidol (HALDOL) 5 MG  tablet Take 5 mg by mouth at bedtime.      Multiple Vitamins-Minerals (MULTIVITAMIN WITH MINERALS) tablet Take 1 tablet by mouth daily.     NONFORMULARY OR COMPOUNDED [place]  Combination Pain Cream -  Baclofen 2%, Doxepin 5%, Gabapentin 6%, Topiramate 2%, Pentoxifylline 3% Apply 1-2 grams to affected area 3-4 times daily Qty. 120 gm 3 refills 1 each 3   ondansetron (ZOFRAN) 8 MG tablet Take 1 tablet (8 mg total) by mouth every 8 (eight) hours as needed for nausea or vomiting. 20 tablet 0   simvastatin (ZOCOR) 20 MG tablet Take 20 mg by mouth at bedtime.      umeclidinium-vilanterol (ANORO  ELLIPTA) 62.5-25 MCG/INH AEPB Inhale 1 puff into the lungs daily. 1 each 11   Current Facility-Administered Medications  Medication Dose Route Frequency Provider Last Rate Last Dose   0.9 %  sodium chloride infusion  500 mL Intravenous Continuous Irene Shipper, MD       0.9 %  sodium chloride infusion  500 mL Intravenous Continuous Irene Shipper, MD       0.9 %  sodium chloride infusion  500 mL Intravenous Continuous Irene Shipper, MD        SURGICAL HISTORY:  Past Surgical History:  Procedure Laterality Date   APPENDECTOMY     CHEST TUBE INSERTION Left    COLONOSCOPY     INSERTION CENTRAL VENOUS ACCESS DEVICE W/ SUBCUTANEOUS PORT Right    LUNG BIOPSY Right 01/27/2015   Procedure: Right Upper Lobe Bronchus BIOPSY;  Surgeon: Grace Isaac, MD;  Location: [facility];  Service: Thoracic;  Laterality: Right;   VIDEO BRONCHOSCOPY WITH ENDOBRONCHIAL ULTRASOUND N/A 01/27/2015   Procedure: VIDEO BRONCHOSCOPY WITH ENDOBRONCHIAL ULTRASOUND;  Surgeon: Grace Isaac, MD;  Location: [facility];  Service: Thoracic;  Laterality: N/A;    REVIEW OF SYSTEMS:  A comprehensive review of systems was negative.   PHYSICAL EXAMINATION: General appearance: alert, cooperative and no distress Head: Normocephalic, without obvious abnormality, atraumatic Neck: no adenopathy, no JVD, supple, symmetrical, trachea midline and thyroid not enlarged, symmetric, no tenderness/mass/nodules Lymph nodes: Cervical, supraclavicular, and axillary nodes normal. Resp: clear to auscultation bilaterally Back: symmetric, no curvature. ROM normal. No CVA tenderness. Cardio: regular rate and rhythm, S1, S2 normal, no murmur, click, rub or gallop GI: soft, non-tender; bowel sounds normal; no masses,  no organomegaly Extremities: extremities normal, atraumatic, no cyanosis or edema  ECOG PERFORMANCE STATUS: 1 - Symptomatic but completely ambulatory  Blood pressure 122/69, pulse (!) 117, temperature 98 F (36.7 C),  temperature source Oral, resp. rate 18, height 5\' 11"  (1.803 m), weight 148 lb 9.6 oz (67.4 kg), SpO2 97 %.  LABORATORY DATA: Lab Results  Component Value Date   WBC 4.8 01/08/2019   HGB 12.0 (L) 01/08/2019   HCT 37.5 (L) 01/08/2019   MCV 86.2 01/08/2019   PLT 213 01/08/2019      Chemistry      Component Value Date/Time   NA 140 01/08/2019 0738   NA 140 02/08/2017 0822   K 3.6 01/08/2019 0738   K 4.5 02/08/2017 0822   CL 106 01/08/2019 0738   CO2 24 01/08/2019 0738   CO2 25 02/08/2017 0822   BUN 8 01/08/2019 0738   BUN 8.8 02/08/2017 0822   CREATININE 1.05 01/08/2019 0738   CREATININE 1.1 02/08/2017 0822      Component Value Date/Time   CALCIUM 8.9 01/08/2019 0738   CALCIUM 9.6 02/08/2017 0822   ALKPHOS  50 01/08/2019 0738   ALKPHOS 53 02/08/2017 0822   AST 17 01/08/2019 0738   AST 19 02/08/2017 0822   ALT 8 01/08/2019 0738   ALT 9 02/08/2017 0822   BILITOT <0.2 (L) 01/08/2019 0738   BILITOT <0.22 02/08/2017 4259       RADIOGRAPHIC STUDIES: Ct Chest W Contrast  Result Date: 12/16/2018 CLINICAL DATA:  Non-small-cell lung cancer. EXAM: CT CHEST WITH CONTRAST TECHNIQUE: Multidetector CT imaging of the chest was performed during intravenous contrast administration. CONTRAST:  38mL OMNIPAQUE IOHEXOL 300 MG/ML  SOLN COMPARISON:  06/17/2018 FINDINGS: Cardiovascular: The heart size is normal. No substantial pericardial effusion. Coronary artery calcification is evident. Right Port-A-Cath tip is in the mid to lower right atrium. Mediastinum/Nodes: 9 mm short axis precarinal node measured previously is 10 mm today. No left hilar lymphadenopathy. Similar appearance of abnormal soft tissue in the upper right hilum. Lungs/Pleura: Rim enhancing lesion in the parahilar aspect of the medial right upper lobe has increased in size in the interval measuring 3.7 x 2.8 cm today compared to 2.9 x 2.9 cm previously. Loculated pleural fluid in the upper right hemithorax 11 is similar to prior.  Treatment changes in the parahilar right lung are similar as is the volume loss in the right hemithorax. In paraseptal emphysema noted bilaterally. 5 mm left upper lobe nodule noted previously has progressed in the interval measuring 9 mm on 78/5 today. Upper Abdomen: Scattered tiny hypodensities in the liver parenchyma are stable, too small to characterize but likely benign. Adrenal glands incompletely visualized without nodule or mass evident. Musculoskeletal: No worrisome lytic or sclerotic osseous abnormality. IMPRESSION: 1. Interval progression of the rim enhancing lesion in the medial right upper lobe, concerning for disease progression. 2. Continued further enlargement of the left upper lobe pulmonary nodule now measuring 9 mm. Imaging features highly suspicious for metastatic disease although second primary lesion not excluded. 3.  Emphysema. ([id number]) Electronically Signed   By: Misty Stanley M.D.   On: 12/16/2018 10:36   Nm Pet Image Restag (ps) Skull Base To Thigh  Result Date: 12/25/2018 CLINICAL DATA:  Subsequent treatment strategy for non-small-cell lung cancer, restaging. Right upper lobe primary. EXAM: NUCLEAR MEDICINE PET SKULL BASE TO THIGH TECHNIQUE: 7.5 mCi F-18 FDG was injected intravenously. Full-ring PET imaging was performed from the skull base to thigh after the radiotracer. CT data was obtained and used for attenuation correction and anatomic localization. Fasting blood glucose: 72 mg/dl COMPARISON:  Chest CT 12/16/2018.  Most recent PET 02/08/2015. FINDINGS: Mediastinal blood pool activity: SUV max 2.5 Liver activity: SUV max NA NECK: No areas of abnormal hypermetabolism. Incidental CT findings: No cervical adenopathy. CHEST: Low-level hypermetabolism corresponding to the medial right apical radiation induced consolidation. The area of more masslike anterior consolidation with peripheral enhancement on the prior contrast enhanced CT corresponds to hypermetabolism and central  necrosis. This measures on the order of 4.0 x 2.7 cm and a S.U.V. max of 23.4, including on 64/4. Hypermetabolism within the superior aspect of the right atrium could be physiologic and relate to radiopharmaceutical injection via the Port-A-Cath. No convincing evidence of thoracic nodal hypermetabolism. Left upper lobe pulmonary nodule measures 7 mm and a S.U.V. max of 11.5 on image 77/4. Incidental CT findings: Right Port-A-Cath tip at high right atrium. Aortic and lad coronary artery atherosclerosis. Centrilobular and paraseptal emphysema. ABDOMEN/PELVIS: No abdominopelvic parenchymal or nodal hypermetabolism. Incidental CT findings: Normal adrenal glands. Abdominal aortic atherosclerosis. Prior ventral pelvic wall hernia repair, eccentric right. Mild prostatomegaly. SKELETON:  No abnormal marrow activity. There is subtle hypermetabolism within the posterior aspect of the spinal canal at approximately the T10 through T12 level. Example at a S.U.V. max of 3.4. No correlate in this area on the recent chest CT. Incidental CT findings: none IMPRESSION: 1. Marked hypermetabolism corresponding to right anterior apical mass, in the setting of radiation change. Findings are most consistent with residual/recurrent disease. 2. Left upper lobe hypermetabolic pulmonary nodule, favored to represent a metachronous primary. Isolated pulmonary metastasis could look similar. 3. No evidence of subdiaphragmatic hypermetabolic metastasis. 4. Likely physiologic hypermetabolism about the posterior aspect of the lower thoracic canal. If any symptoms to suggest cord pathology, consider pre and postcontrast thoracic spine MRI. 5. Aortic atherosclerosis (ICD10-I70.0), coronary artery atherosclerosis and emphysema (ICD10-J43.9). Electronically Signed   By: Abigail Miyamoto M.D.   On: 12/25/2018 15:49    ASSESSMENT AND PLAN:  This is a very pleasant 73 years old African-American male with a stage IIIA non-small cell lung cancer, squamous cell  carcinoma status post concurrent chemoradiation followed by consolidation chemotherapy. The patient has been in observation for close to 3 years. He had evidence for disease recurrence on the recent imaging studies including CT scan of the chest as well as PET scan. The patient is started  treatment with systemic chemotherapy with carboplatin for AUC of 5, paclitaxel 175 mg/M2 as well as combination of immunotherapy with nivolumab 360 mg IV every 3 weeks and ipilimumab 1 mg/KG every 6 weeks.  The chemotherapy will be given only for 2 cycles and then the patient will be maintained on immunotherapy with nivolumab and ipilimumab for a total of 2 years if he has no evidence for disease progression or unacceptable toxicity.  He is status post 1 cycle started last week. The patient is feeling fine today with no concerning complaints.  CBC showed chemotherapy-induced leukocytopenia and neutropenia but the patient received Neulasta injection after his treatment. I provided the patient with neutropenic precautions. I will see him back for follow-up visit in 2 weeks for evaluation before starting cycle #2. The patient was advised to call immediately if he has any concerning symptoms in the interval.  Disclaimer: This note was dictated with voice recognition software. Similar sounding words can inadvertently be transcribed and may not be corrected upon review.

## 2019-01-16 ENCOUNTER — Telehealth: Payer: Self-pay | Admitting: *Deleted

## 2019-01-16 NOTE — Telephone Encounter (Signed)
Biotine mouth wash

## 2019-01-16 NOTE — Telephone Encounter (Signed)
Received vm message from pt's sister, Tamela Oddi.  She states pt has a very painful mouth, painful swallowing. He has white patches inside his mouth. He is having trouble swallowing. He is keeping his dentures out. He has tried warm salt water and baking soda without any relief. She is asking for something to help his pain.  Please advise

## 2019-01-17 ENCOUNTER — Other Ambulatory Visit: Payer: Self-pay | Admitting: Physician Assistant

## 2019-01-17 DIAGNOSIS — K1329 Other disturbances of oral epithelium, including tongue: Secondary | ICD-10-CM

## 2019-01-17 DIAGNOSIS — K1321 Leukoplakia of oral mucosa, including tongue: Secondary | ICD-10-CM

## 2019-01-17 MED ORDER — FLUCONAZOLE 100 MG PO TABS: 100.0000 mg | ORAL_TABLET | Freq: Every day | ORAL | 0 refills | Status: DC

## 2019-01-17 NOTE — Telephone Encounter (Signed)
Ivan Daniels notified to pick up diflucan rx and have Ivan Daniels take it for 7 days. During this time he needs to hold his simvastatin . I also told her to get Biotine mouthwash for Ivan Daniels to use as directed. She voiced understanding.

## 2019-01-22 ENCOUNTER — Other Ambulatory Visit: Payer: Self-pay

## 2019-01-22 ENCOUNTER — Inpatient Hospital Stay: Payer: Medicare Other

## 2019-01-22 DIAGNOSIS — Z5189 Encounter for other specified aftercare: Secondary | ICD-10-CM | POA: Diagnosis not present

## 2019-01-22 DIAGNOSIS — D701 Agranulocytosis secondary to cancer chemotherapy: Secondary | ICD-10-CM | POA: Diagnosis not present

## 2019-01-22 DIAGNOSIS — R5382 Chronic fatigue, unspecified: Secondary | ICD-10-CM

## 2019-01-22 DIAGNOSIS — C3491 Malignant neoplasm of unspecified part of right bronchus or lung: Secondary | ICD-10-CM

## 2019-01-22 DIAGNOSIS — Z5111 Encounter for antineoplastic chemotherapy: Secondary | ICD-10-CM | POA: Diagnosis not present

## 2019-01-22 DIAGNOSIS — C3411 Malignant neoplasm of upper lobe, right bronchus or lung: Secondary | ICD-10-CM | POA: Diagnosis not present

## 2019-01-22 DIAGNOSIS — Z5112 Encounter for antineoplastic immunotherapy: Secondary | ICD-10-CM | POA: Diagnosis not present

## 2019-01-22 DIAGNOSIS — Z23 Encounter for immunization: Secondary | ICD-10-CM | POA: Diagnosis not present

## 2019-01-22 LAB — CBC WITH DIFFERENTIAL (CANCER CENTER ONLY)
Abs Immature Granulocytes: 0.14 10*3/uL — ABNORMAL HIGH (ref 0.00–0.07)
Basophils Absolute: 0 10*3/uL (ref 0.0–0.1)
Basophils Relative: 0 %
Eosinophils Absolute: 0 10*3/uL (ref 0.0–0.5)
Eosinophils Relative: 0 %
HCT: 37.3 % — ABNORMAL LOW (ref 39.0–52.0)
Hemoglobin: 11.9 g/dL — ABNORMAL LOW (ref 13.0–17.0)
Immature Granulocytes: 1 %
Lymphocytes Relative: 17 %
Lymphs Abs: 1.8 10*3/uL (ref 0.7–4.0)
MCH: 27.6 pg (ref 26.0–34.0)
MCHC: 31.9 g/dL (ref 30.0–36.0)
MCV: 86.5 fL (ref 80.0–100.0)
Monocytes Absolute: 0.9 10*3/uL (ref 0.1–1.0)
Monocytes Relative: 9 %
Neutro Abs: 7.9 10*3/uL — ABNORMAL HIGH (ref 1.7–7.7)
Neutrophils Relative %: 73 %
Platelet Count: 131 10*3/uL — ABNORMAL LOW (ref 150–400)
RBC: 4.31 MIL/uL (ref 4.22–5.81)
RDW: 18.2 % — ABNORMAL HIGH (ref 11.5–15.5)
WBC Count: 10.7 10*3/uL — ABNORMAL HIGH (ref 4.0–10.5)
nRBC: 0.2 % (ref 0.0–0.2)

## 2019-01-22 LAB — CMP (CANCER CENTER ONLY)
ALT: 11 U/L (ref 0–44)
AST: 19 U/L (ref 15–41)
Albumin: 4 g/dL (ref 3.5–5.0)
Alkaline Phosphatase: 81 U/L (ref 38–126)
Anion gap: 10 (ref 5–15)
BUN: 10 mg/dL (ref 8–23)
CO2: 26 mmol/L (ref 22–32)
Calcium: 9.4 mg/dL (ref 8.9–10.3)
Chloride: 102 mmol/L (ref 98–111)
Creatinine: 1.1 mg/dL (ref 0.61–1.24)
GFR, Est AFR Am: >60 mL/min (ref >60)
GFR, Estimated: >60 mL/min (ref >60)
Glucose, Bld: 86 mg/dL (ref 70–99)
Potassium: 4.4 mmol/L (ref 3.5–5.1)
Sodium: 138 mmol/L (ref 135–145)
Total Bilirubin: <0.2 mg/dL — ABNORMAL LOW (ref 0.3–1.2)
Total Protein: 8 g/dL (ref 6.5–8.1)

## 2019-01-22 LAB — TSH: TSH: 2.821 u[IU]/mL (ref 0.320–4.118)

## 2019-01-29 ENCOUNTER — Encounter: Payer: Self-pay | Admitting: Internal Medicine

## 2019-01-29 ENCOUNTER — Inpatient Hospital Stay (HOSPITAL_BASED_OUTPATIENT_CLINIC_OR_DEPARTMENT_OTHER): Payer: Medicare Other | Admitting: Internal Medicine

## 2019-01-29 ENCOUNTER — Inpatient Hospital Stay: Payer: Medicare Other

## 2019-01-29 ENCOUNTER — Other Ambulatory Visit: Payer: Self-pay

## 2019-01-29 VITALS — BP 139/86 | HR 100 | Temp 98.3°F | Resp 18 | Ht 71.0 in | Wt 148.6 lb

## 2019-01-29 DIAGNOSIS — Z23 Encounter for immunization: Secondary | ICD-10-CM | POA: Diagnosis not present

## 2019-01-29 DIAGNOSIS — C3491 Malignant neoplasm of unspecified part of right bronchus or lung: Secondary | ICD-10-CM

## 2019-01-29 DIAGNOSIS — C3411 Malignant neoplasm of upper lobe, right bronchus or lung: Secondary | ICD-10-CM | POA: Diagnosis not present

## 2019-01-29 DIAGNOSIS — Z5111 Encounter for antineoplastic chemotherapy: Secondary | ICD-10-CM

## 2019-01-29 DIAGNOSIS — Z5112 Encounter for antineoplastic immunotherapy: Secondary | ICD-10-CM | POA: Diagnosis not present

## 2019-01-29 DIAGNOSIS — D701 Agranulocytosis secondary to cancer chemotherapy: Secondary | ICD-10-CM | POA: Diagnosis not present

## 2019-01-29 DIAGNOSIS — Z5189 Encounter for other specified aftercare: Secondary | ICD-10-CM | POA: Diagnosis not present

## 2019-01-29 LAB — CBC WITH DIFFERENTIAL (CANCER CENTER ONLY)
Abs Immature Granulocytes: 0.01 10*3/uL (ref 0.00–0.07)
Basophils Absolute: 0 10*3/uL (ref 0.0–0.1)
Basophils Relative: 1 %
Eosinophils Absolute: 0 10*3/uL (ref 0.0–0.5)
Eosinophils Relative: 0 %
HCT: 36.2 % — ABNORMAL LOW (ref 39.0–52.0)
Hemoglobin: 11.6 g/dL — ABNORMAL LOW (ref 13.0–17.0)
Immature Granulocytes: 0 %
Lymphocytes Relative: 34 %
Lymphs Abs: 1.4 10*3/uL (ref 0.7–4.0)
MCH: 27.9 pg (ref 26.0–34.0)
MCHC: 32 g/dL (ref 30.0–36.0)
MCV: 87 fL (ref 80.0–100.0)
Monocytes Absolute: 0.4 10*3/uL (ref 0.1–1.0)
Monocytes Relative: 10 %
Neutro Abs: 2.2 10*3/uL (ref 1.7–7.7)
Neutrophils Relative %: 55 %
Platelet Count: 243 10*3/uL (ref 150–400)
RBC: 4.16 MIL/uL — ABNORMAL LOW (ref 4.22–5.81)
RDW: 18.1 % — ABNORMAL HIGH (ref 11.5–15.5)
WBC Count: 4 10*3/uL (ref 4.0–10.5)
nRBC: 0 % (ref 0.0–0.2)

## 2019-01-29 LAB — CMP (CANCER CENTER ONLY)
ALT: 9 U/L (ref 0–44)
AST: 17 U/L (ref 15–41)
Albumin: 3.7 g/dL (ref 3.5–5.0)
Alkaline Phosphatase: 59 U/L (ref 38–126)
Anion gap: 10 (ref 5–15)
BUN: 12 mg/dL (ref 8–23)
CO2: 26 mmol/L (ref 22–32)
Calcium: 8.9 mg/dL (ref 8.9–10.3)
Chloride: 104 mmol/L (ref 98–111)
Creatinine: 1.06 mg/dL (ref 0.61–1.24)
GFR, Est AFR Am: >60 mL/min (ref >60)
GFR, Estimated: >60 mL/min (ref >60)
Glucose, Bld: 119 mg/dL — ABNORMAL HIGH (ref 70–99)
Potassium: 3.8 mmol/L (ref 3.5–5.1)
Sodium: 140 mmol/L (ref 135–145)
Total Bilirubin: <0.2 mg/dL — ABNORMAL LOW (ref 0.3–1.2)
Total Protein: 7.4 g/dL (ref 6.5–8.1)

## 2019-01-29 MED ORDER — SODIUM CHLORIDE 0.9 % IV SOLN
Freq: Once | INTRAVENOUS | Status: AC
  Administered 2019-01-29: 10:00:00 via INTRAVENOUS
  Filled 2019-01-29: qty 5

## 2019-01-29 MED ORDER — PALONOSETRON HCL INJECTION 0.25 MG/5ML
INTRAVENOUS | Status: AC
  Filled 2019-01-29: qty 5

## 2019-01-29 MED ORDER — DIPHENHYDRAMINE HCL 50 MG/ML IJ SOLN
INTRAMUSCULAR | Status: AC
  Filled 2019-01-29: qty 1

## 2019-01-29 MED ORDER — SODIUM CHLORIDE 0.9% FLUSH
10.0000 mL | INTRAVENOUS | Status: DC | PRN
  Administered 2019-01-29: 10 mL
  Filled 2019-01-29: qty 10

## 2019-01-29 MED ORDER — HEPARIN SOD (PORK) LOCK FLUSH 100 UNIT/ML IV SOLN
500.0000 [IU] | Freq: Once | INTRAVENOUS | Status: AC | PRN
  Administered 2019-01-29: 500 [IU]
  Filled 2019-01-29: qty 5

## 2019-01-29 MED ORDER — SODIUM CHLORIDE 0.9 % IV SOLN
Freq: Once | INTRAVENOUS | Status: AC
  Administered 2019-01-29: 09:00:00 via INTRAVENOUS
  Filled 2019-01-29: qty 250

## 2019-01-29 MED ORDER — INFLUENZA VAC A&B SA ADJ QUAD 0.5 ML IM PRSY
PREFILLED_SYRINGE | INTRAMUSCULAR | Status: AC
  Filled 2019-01-29: qty 0.5

## 2019-01-29 MED ORDER — INFLUENZA VAC A&B SA ADJ QUAD 0.5 ML IM PRSY
0.5000 mL | PREFILLED_SYRINGE | Freq: Once | INTRAMUSCULAR | Status: AC
  Administered 2019-01-29: 0.5 mL via INTRAMUSCULAR

## 2019-01-29 MED ORDER — DIPHENHYDRAMINE HCL 50 MG/ML IJ SOLN
50.0000 mg | Freq: Once | INTRAMUSCULAR | Status: AC
  Administered 2019-01-29: 50 mg via INTRAVENOUS

## 2019-01-29 MED ORDER — SODIUM CHLORIDE 0.9 % IV SOLN
360.0000 mg | Freq: Once | INTRAVENOUS | Status: AC
  Administered 2019-01-29: 360 mg via INTRAVENOUS
  Filled 2019-01-29: qty 16

## 2019-01-29 MED ORDER — SODIUM CHLORIDE 0.9 % IV SOLN
175.0000 mg/m2 | Freq: Once | INTRAVENOUS | Status: AC
  Administered 2019-01-29: 324 mg via INTRAVENOUS
  Filled 2019-01-29: qty 54

## 2019-01-29 MED ORDER — PALONOSETRON HCL INJECTION 0.25 MG/5ML
0.2500 mg | Freq: Once | INTRAVENOUS | Status: AC
  Administered 2019-01-29: 0.25 mg via INTRAVENOUS

## 2019-01-29 MED ORDER — FAMOTIDINE IN NACL 20-0.9 MG/50ML-% IV SOLN
INTRAVENOUS | Status: AC
  Filled 2019-01-29: qty 50

## 2019-01-29 MED ORDER — FAMOTIDINE IN NACL 20-0.9 MG/50ML-% IV SOLN
20.0000 mg | Freq: Once | INTRAVENOUS | Status: AC
  Administered 2019-01-29: 09:00:00 20 mg via INTRAVENOUS

## 2019-01-29 MED ORDER — SODIUM CHLORIDE 0.9 % IV SOLN
375.5000 mg | Freq: Once | INTRAVENOUS | Status: AC
  Administered 2019-01-29: 380 mg via INTRAVENOUS
  Filled 2019-01-29: qty 38

## 2019-01-29 NOTE — Progress Notes (Signed)
Leave carbo dose at 380mg  (auc approx 4) and decrease taxol to 175mg /m2 per Dr Julien Nordmann

## 2019-01-29 NOTE — Patient Instructions (Signed)
[facility] Discharge Instructions for Patients Receiving Chemotherapy  Today you received the following chemotherapy agents Opdivo, Taxol, Carbo.  To help prevent nausea and vomiting after your treatment, we encourage you to take your nausea medication.  If you develop nausea and vomiting that is not controlled by your nausea medication, call the clinic.   BELOW ARE SYMPTOMS THAT SHOULD BE REPORTED IMMEDIATELY:  *FEVER GREATER THAN 100.5 F  *CHILLS WITH OR WITHOUT FEVER  NAUSEA AND VOMITING THAT IS NOT CONTROLLED WITH YOUR NAUSEA MEDICATION  *UNUSUAL SHORTNESS OF BREATH  *UNUSUAL BRUISING OR BLEEDING  TENDERNESS IN MOUTH AND THROAT WITH OR WITHOUT PRESENCE OF ULCERS  *URINARY PROBLEMS  *BOWEL PROBLEMS  UNUSUAL RASH Items with * indicate a potential emergency and should be followed up as soon as possible.  Feel free to call the clinic should you have any questions or concerns. The clinic phone number is [phone].  Please show the [facility] at check-in to the Emergency Department and triage nurse.

## 2019-01-29 NOTE — Progress Notes (Signed)
[facility] [phone]   [phone]  OFFICE PROGRESS NOTE  [name], FNP [address] [place] [address]  DIAGNOSIS: Recurrent non-small cell lung cancer initially diagnosed as stage IIIA (T2a, N2, M0) non-small cell lung cancer consistent with invasive squamous cell carcinoma presented with right upper lobe lung mass in addition to right hilar and mediastinal lymphadenopathy diagnosed in September 2016.  He has disease recurrence in August 2020.  PRIOR THERAPY:  1) Course of concurrent chemoradiation with weekly carboplatin for AUC of 2 and paclitaxel 45 MG/M2. First dose 03/01/2015. He is status post 6 cycles. Last cycle was given 04/12/2015 with partial response. 2) Consolidation chemotherapy with reduced dose carboplatin for AUC of 5 and paclitaxel 175 MG/M2 every 3 weeks, status post 3 cycles.  CURRENT THERAPY: Systemic chemotherapy with carboplatin for AUC of 5, paclitaxel 175 mg/M2 with nivolumab 360 mg IV every 3 weeks in addition to ipilimumab 1 mg/KG every 6 weeks.  First dose 01/08/2019.  Status post 1 cycle.  INTERVAL HISTORY: Ivan Daniels 73 y.o. male returns to the clinic today for follow-up visit.  The patient tolerated the first cycle of his systemic chemotherapy fairly well.  He denied having any chest pain, shortness of breath, cough or hemoptysis.  He denied having any skin rash or itching.  He denied having any nausea, vomiting, diarrhea or constipation.  He has no significant weight loss.  He is here today for evaluation before starting cycle #2.   MEDICAL HISTORY: Past Medical History:  Diagnosis Date  . ALCOHOL ABUSE 11/12/2009   Qualifier: Diagnosis of  By: Hassell Done FNP, Tori Milks    . Allergy   . Anemia   . Anxiety   . BENIGN PROSTATIC HYPERTROPHY, HX OF 12/19/2006   Qualifier: Diagnosis of  By: Radene Ou MD, [place]    . Blood transfusion without reported diagnosis 2017  . Clotting disorder ([facility])    takes PLAVIX  . Depression   . Emphysema of lung ([facility])   . Full code status 02/10/2015  . GERD (gastroesophageal reflux disease)   . Hyperlipidemia   . Lung mass 01/25/2015   3.7 x 3.6 cm RUL spiculated lung mass with 2.0 cm right paratracheal lymph node suspicious for bronchogenic CA  . Non-small cell lung cancer ([facility]) 01/27/15   non small-cell,squamous cell ca RUL  . Paranoid schizophrenia ([facility]) 10/31/2009   Qualifier: Diagnosis of  By: Jorene Minors, [place]    . Primary spontaneous pneumothorax, left 01/25/2015  . TOBACCO ABUSE 05/24/2009   Qualifier: Diagnosis of  By: Hassell Done FNP, Nykedtra      ALLERGIES:  is allergic to benztropine mesylate and chlorpromazine hcl.  MEDICATIONS:  Current Outpatient Medications  Medication Sig Dispense Refill  . ALPHAGAN P 0.1 % SOLN Place 1 drop into both eyes 3 (three) times daily.  6  . aspirin EC 81 MG tablet Take 81 mg by mouth 2 (two) times daily.    . benztropine (COGENTIN) 0.5 MG tablet Take 0.5 mg by mouth at bedtime.     . chlorhexidine (PERIDEX) 0.12 % solution 1 mL by Mouth Rinse route as needed.   1  . clopidogrel (PLAVIX) 75 MG tablet Take 75 mg by mouth daily.     . ferrous fumarate (HEMOCYTE - 106 MG FE) 325 (106 FE) MG TABS tablet Take 1 tablet by mouth daily.     . fluconazole (DIFLUCAN) 100 MG tablet Take 1 tablet (100 mg total) by mouth  daily. 7 tablet 0  . haloperidol (HALDOL) 5 MG tablet Take 5 mg by mouth at bedtime.     . Multiple Vitamins-Minerals (MULTIVITAMIN WITH MINERALS) tablet Take 1 tablet by mouth daily.    . NONFORMULARY OR COMPOUNDED [place]  Combination Pain Cream -  Baclofen 2%, Doxepin 5%, Gabapentin 6%, Topiramate 2%, Pentoxifylline 3% Apply 1-2 grams to affected area 3-4 times daily Qty. 120 gm 3 refills 1 each 3  . ondansetron (ZOFRAN) 8 MG tablet Take 1 tablet (8 mg total) by mouth every 8 (eight) hours as needed for nausea or vomiting. 20 tablet 0  . simvastatin (ZOCOR) 20 MG tablet Take 20 mg  by mouth at bedtime.     Marland Kitchen umeclidinium-vilanterol (ANORO ELLIPTA) 62.5-25 MCG/INH AEPB Inhale 1 puff into the lungs daily. 1 each 11   Current Facility-Administered Medications  Medication Dose Route Frequency Provider Last Rate Last Dose  . 0.9 %  sodium chloride infusion  500 mL Intravenous Continuous Irene Shipper, MD      . 0.9 %  sodium chloride infusion  500 mL Intravenous Continuous Irene Shipper, MD      . 0.9 %  sodium chloride infusion  500 mL Intravenous Continuous Irene Shipper, MD        SURGICAL HISTORY:  Past Surgical History:  Procedure Laterality Date  . APPENDECTOMY    . CHEST TUBE INSERTION Left   . COLONOSCOPY    . INSERTION CENTRAL VENOUS ACCESS DEVICE W/ SUBCUTANEOUS PORT Right   . LUNG BIOPSY Right 01/27/2015   Procedure: Right Upper Lobe Bronchus BIOPSY;  Surgeon: Grace Isaac, MD;  Location: [facility];  Service: Thoracic;  Laterality: Right;  Marland Kitchen VIDEO BRONCHOSCOPY WITH ENDOBRONCHIAL ULTRASOUND N/A 01/27/2015   Procedure: VIDEO BRONCHOSCOPY WITH ENDOBRONCHIAL ULTRASOUND;  Surgeon: Grace Isaac, MD;  [facility];  Service: Thoracic;  Laterality: N/A;    REVIEW OF SYSTEMS:  A comprehensive review of systems was negative.   PHYSICAL EXAMINATION: General appearance: alert, cooperative and no distress Head: Normocephalic, without obvious abnormality, atraumatic Neck: no adenopathy, no JVD, supple, symmetrical, trachea midline and thyroid not enlarged, symmetric, no tenderness/mass/nodules Lymph nodes: Cervical, supraclavicular, and axillary nodes normal. Resp: clear to auscultation bilaterally Back: symmetric, no curvature. ROM normal. No CVA tenderness. Cardio: regular rate and rhythm, S1, S2 normal, no murmur, click, rub or gallop GI: soft, non-tender; bowel sounds normal; no masses,  no organomegaly Extremities: extremities normal, atraumatic, no cyanosis or edema  ECOG PERFORMANCE STATUS: 1 - Symptomatic but completely ambulatory  Blood pressure  139/86, pulse 100, temperature 98.3 F (36.8 C), temperature source Oral, resp. rate 18, height 5\' 11"  (1.803 m), weight 148 lb 9.6 oz (67.4 kg), SpO2 100 %.  LABORATORY DATA: Lab Results  Component Value Date   WBC 4.0 01/29/2019   HGB 11.6 (L) 01/29/2019   HCT 36.2 (L) 01/29/2019   MCV 87.0 01/29/2019   PLT 243 01/29/2019      Chemistry      Component Value Date/Time   NA 138 01/22/2019 1018   NA 140 02/08/2017 0822   K 4.4 01/22/2019 1018   K 4.5 02/08/2017 0822   CL 102 01/22/2019 1018   CO2 26 01/22/2019 1018   CO2 25 02/08/2017 0822   BUN 10 01/22/2019 1018   BUN 8.8 02/08/2017 0822   CREATININE 1.10 01/22/2019 1018   CREATININE 1.1 02/08/2017 0822      Component Value Date/Time   CALCIUM 9.4 01/22/2019 1018  CALCIUM 9.6 02/08/2017 0822   ALKPHOS 81 01/22/2019 1018   ALKPHOS 53 02/08/2017 0822   AST 19 01/22/2019 1018   AST 19 02/08/2017 0822   ALT 11 01/22/2019 1018   ALT 9 02/08/2017 0822   BILITOT <0.2 (L) 01/22/2019 1018   BILITOT <0.22 02/08/2017 0822       RADIOGRAPHIC STUDIES: No results found.  ASSESSMENT AND PLAN:  This is a very pleasant 73 years old African-American male with a stage IIIA non-small cell lung cancer, squamous cell carcinoma status post concurrent chemoradiation followed by consolidation chemotherapy. The patient has been in observation for close to 3 years. He had evidence for disease recurrence on the recent imaging studies including CT scan of the chest as well as PET scan. The patient is started  treatment with systemic chemotherapy with carboplatin for AUC of 5, paclitaxel 175 mg/M2 as well as combination of immunotherapy with nivolumab 360 mg IV every 3 weeks and ipilimumab 1 mg/KG every 6 weeks.  The chemotherapy will be given only for 2 cycles and then the patient will be maintained on immunotherapy with nivolumab and ipilimumab for a total of 2 years if he has no evidence for disease progression or unacceptable toxicity.  He  is status post 1 cycle. The patient tolerated the first cycle of this treatment fairly well with no concerning adverse effects. I recommended for him to proceed with cycle #2 today as planned. He will come back for follow-up visit in 3 weeks for evaluation before starting cycle #3. The patient was advised to call immediately if he has any concerning symptoms in the interval. The patient agreed to the current plan.  Disclaimer: This note was dictated with voice recognition software. Similar sounding words can inadvertently be transcribed and may not be corrected upon review.

## 2019-01-31 ENCOUNTER — Inpatient Hospital Stay: Payer: Medicare Other

## 2019-01-31 ENCOUNTER — Other Ambulatory Visit: Payer: Self-pay

## 2019-01-31 VITALS — BP 128/72 | HR 88 | Temp 98.2°F | Resp 18

## 2019-01-31 DIAGNOSIS — C3491 Malignant neoplasm of unspecified part of right bronchus or lung: Secondary | ICD-10-CM

## 2019-01-31 DIAGNOSIS — Z5112 Encounter for antineoplastic immunotherapy: Secondary | ICD-10-CM | POA: Diagnosis not present

## 2019-01-31 DIAGNOSIS — D701 Agranulocytosis secondary to cancer chemotherapy: Secondary | ICD-10-CM | POA: Diagnosis not present

## 2019-01-31 DIAGNOSIS — Z23 Encounter for immunization: Secondary | ICD-10-CM | POA: Diagnosis not present

## 2019-01-31 DIAGNOSIS — C3411 Malignant neoplasm of upper lobe, right bronchus or lung: Secondary | ICD-10-CM | POA: Diagnosis not present

## 2019-01-31 DIAGNOSIS — Z5111 Encounter for antineoplastic chemotherapy: Secondary | ICD-10-CM | POA: Diagnosis not present

## 2019-01-31 DIAGNOSIS — Z5189 Encounter for other specified aftercare: Secondary | ICD-10-CM | POA: Diagnosis not present

## 2019-01-31 MED ORDER — PEGFILGRASTIM-JMDB 6 MG/0.6ML ~~LOC~~ SOSY
6.0000 mg | PREFILLED_SYRINGE | Freq: Once | SUBCUTANEOUS | Status: AC
  Administered 2019-01-31: 6 mg via SUBCUTANEOUS

## 2019-02-05 ENCOUNTER — Other Ambulatory Visit: Payer: Self-pay

## 2019-02-05 ENCOUNTER — Telehealth: Payer: Self-pay | Admitting: Medical Oncology

## 2019-02-05 ENCOUNTER — Inpatient Hospital Stay: Payer: Medicare Other

## 2019-02-05 DIAGNOSIS — Z5112 Encounter for antineoplastic immunotherapy: Secondary | ICD-10-CM | POA: Diagnosis not present

## 2019-02-05 DIAGNOSIS — D701 Agranulocytosis secondary to cancer chemotherapy: Secondary | ICD-10-CM | POA: Diagnosis not present

## 2019-02-05 DIAGNOSIS — C3411 Malignant neoplasm of upper lobe, right bronchus or lung: Secondary | ICD-10-CM | POA: Diagnosis not present

## 2019-02-05 DIAGNOSIS — C3491 Malignant neoplasm of unspecified part of right bronchus or lung: Secondary | ICD-10-CM

## 2019-02-05 DIAGNOSIS — Z5111 Encounter for antineoplastic chemotherapy: Secondary | ICD-10-CM | POA: Diagnosis not present

## 2019-02-05 DIAGNOSIS — Z5189 Encounter for other specified aftercare: Secondary | ICD-10-CM | POA: Diagnosis not present

## 2019-02-05 DIAGNOSIS — Z23 Encounter for immunization: Secondary | ICD-10-CM | POA: Diagnosis not present

## 2019-02-05 LAB — CMP (CANCER CENTER ONLY)
ALT: 14 U/L (ref 0–44)
AST: 16 U/L (ref 15–41)
Albumin: 3.7 g/dL (ref 3.5–5.0)
Alkaline Phosphatase: 53 U/L (ref 38–126)
Anion gap: 9 (ref 5–15)
BUN: 13 mg/dL (ref 8–23)
CO2: 28 mmol/L (ref 22–32)
Calcium: 9.6 mg/dL (ref 8.9–10.3)
Chloride: 98 mmol/L (ref 98–111)
Creatinine: 0.87 mg/dL (ref 0.61–1.24)
GFR, Est AFR Am: >60 mL/min (ref >60)
GFR, Estimated: >60 mL/min (ref >60)
Glucose, Bld: 98 mg/dL (ref 70–99)
Potassium: 4.2 mmol/L (ref 3.5–5.1)
Sodium: 135 mmol/L (ref 135–145)
Total Bilirubin: 0.3 mg/dL (ref 0.3–1.2)
Total Protein: 7.6 g/dL (ref 6.5–8.1)

## 2019-02-05 LAB — CBC WITH DIFFERENTIAL (CANCER CENTER ONLY)
Abs Immature Granulocytes: 0 10*3/uL (ref 0.00–0.07)
Basophils Absolute: 0 10*3/uL (ref 0.0–0.1)
Basophils Relative: 1 %
Eosinophils Absolute: 0 10*3/uL (ref 0.0–0.5)
Eosinophils Relative: 2 %
HCT: 32.1 % — ABNORMAL LOW (ref 39.0–52.0)
Hemoglobin: 10.4 g/dL — ABNORMAL LOW (ref 13.0–17.0)
Immature Granulocytes: 0 %
Lymphocytes Relative: 60 %
Lymphs Abs: 0.6 10*3/uL — ABNORMAL LOW (ref 0.7–4.0)
MCH: 28.3 pg (ref 26.0–34.0)
MCHC: 32.4 g/dL (ref 30.0–36.0)
MCV: 87.2 fL (ref 80.0–100.0)
Monocytes Absolute: 0.2 10*3/uL (ref 0.1–1.0)
Monocytes Relative: 14 %
Neutro Abs: 0.2 10*3/uL — CL (ref 1.7–7.7)
Neutrophils Relative %: 23 %
Platelet Count: 117 10*3/uL — ABNORMAL LOW (ref 150–400)
RBC: 3.68 MIL/uL — ABNORMAL LOW (ref 4.22–5.81)
RDW: 17.6 % — ABNORMAL HIGH (ref 11.5–15.5)
WBC Count: 1.1 10*3/uL — ABNORMAL LOW (ref 4.0–10.5)
nRBC: 0 % (ref 0.0–0.2)

## 2019-02-05 NOTE — Telephone Encounter (Signed)
Reviewed labs with sister. Reviewed neutropenic precautions.Instructed to call for temp > 100.5 /chills.

## 2019-02-12 ENCOUNTER — Other Ambulatory Visit: Payer: Self-pay

## 2019-02-12 ENCOUNTER — Inpatient Hospital Stay: Payer: Medicare Other | Attending: Internal Medicine

## 2019-02-12 DIAGNOSIS — C3491 Malignant neoplasm of unspecified part of right bronchus or lung: Secondary | ICD-10-CM

## 2019-02-12 DIAGNOSIS — Z79899 Other long term (current) drug therapy: Secondary | ICD-10-CM | POA: Diagnosis not present

## 2019-02-12 DIAGNOSIS — Z5112 Encounter for antineoplastic immunotherapy: Secondary | ICD-10-CM | POA: Diagnosis not present

## 2019-02-12 DIAGNOSIS — Z5111 Encounter for antineoplastic chemotherapy: Secondary | ICD-10-CM | POA: Diagnosis not present

## 2019-02-12 DIAGNOSIS — C3411 Malignant neoplasm of upper lobe, right bronchus or lung: Secondary | ICD-10-CM | POA: Diagnosis not present

## 2019-02-12 DIAGNOSIS — R5382 Chronic fatigue, unspecified: Secondary | ICD-10-CM

## 2019-02-12 LAB — CBC WITH DIFFERENTIAL (CANCER CENTER ONLY)
Abs Immature Granulocytes: 0.12 10*3/uL — ABNORMAL HIGH (ref 0.00–0.07)
Basophils Absolute: 0 10*3/uL (ref 0.0–0.1)
Basophils Relative: 0 %
Eosinophils Absolute: 0 10*3/uL (ref 0.0–0.5)
Eosinophils Relative: 0 %
HCT: 33.7 % — ABNORMAL LOW (ref 39.0–52.0)
Hemoglobin: 10.5 g/dL — ABNORMAL LOW (ref 13.0–17.0)
Immature Granulocytes: 1 %
Lymphocytes Relative: 14 %
Lymphs Abs: 1.5 10*3/uL (ref 0.7–4.0)
MCH: 27.9 pg (ref 26.0–34.0)
MCHC: 31.2 g/dL (ref 30.0–36.0)
MCV: 89.4 fL (ref 80.0–100.0)
Monocytes Absolute: 0.7 10*3/uL (ref 0.1–1.0)
Monocytes Relative: 6 %
Neutro Abs: 8.8 10*3/uL — ABNORMAL HIGH (ref 1.7–7.7)
Neutrophils Relative %: 79 %
Platelet Count: 202 10*3/uL (ref 150–400)
RBC: 3.77 MIL/uL — ABNORMAL LOW (ref 4.22–5.81)
RDW: 19.1 % — ABNORMAL HIGH (ref 11.5–15.5)
WBC Count: 11.2 10*3/uL — ABNORMAL HIGH (ref 4.0–10.5)
nRBC: 0.2 % (ref 0.0–0.2)

## 2019-02-12 LAB — CMP (CANCER CENTER ONLY)
ALT: 14 U/L (ref 0–44)
AST: 17 U/L (ref 15–41)
Albumin: 3.6 g/dL (ref 3.5–5.0)
Alkaline Phosphatase: 79 U/L (ref 38–126)
Anion gap: 8 (ref 5–15)
BUN: 7 mg/dL — ABNORMAL LOW (ref 8–23)
CO2: 27 mmol/L (ref 22–32)
Calcium: 9.3 mg/dL (ref 8.9–10.3)
Chloride: 103 mmol/L (ref 98–111)
Creatinine: 1 mg/dL (ref 0.61–1.24)
GFR, Est AFR Am: >60 mL/min (ref >60)
GFR, Estimated: >60 mL/min (ref >60)
Glucose, Bld: 100 mg/dL — ABNORMAL HIGH (ref 70–99)
Potassium: 3.8 mmol/L (ref 3.5–5.1)
Sodium: 138 mmol/L (ref 135–145)
Total Bilirubin: <0.2 mg/dL — ABNORMAL LOW (ref 0.3–1.2)
Total Protein: 7.8 g/dL (ref 6.5–8.1)

## 2019-02-12 LAB — TSH: TSH: 3.061 u[IU]/mL (ref 0.320–4.118)

## 2019-02-13 DIAGNOSIS — F209 Schizophrenia, unspecified: Secondary | ICD-10-CM | POA: Diagnosis not present

## 2019-02-18 ENCOUNTER — Other Ambulatory Visit: Payer: Self-pay

## 2019-02-18 DIAGNOSIS — C3491 Malignant neoplasm of unspecified part of right bronchus or lung: Secondary | ICD-10-CM

## 2019-02-18 NOTE — Progress Notes (Signed)
cbc

## 2019-02-19 ENCOUNTER — Telehealth: Payer: Self-pay | Admitting: Internal Medicine

## 2019-02-19 ENCOUNTER — Other Ambulatory Visit: Payer: Self-pay

## 2019-02-19 ENCOUNTER — Inpatient Hospital Stay: Payer: Medicare Other

## 2019-02-19 ENCOUNTER — Encounter: Payer: Self-pay | Admitting: Physician Assistant

## 2019-02-19 ENCOUNTER — Inpatient Hospital Stay (HOSPITAL_BASED_OUTPATIENT_CLINIC_OR_DEPARTMENT_OTHER): Payer: Medicare Other | Admitting: Physician Assistant

## 2019-02-19 VITALS — BP 138/81 | HR 105 | Temp 98.3°F | Resp 18 | Ht 71.0 in | Wt 145.8 lb

## 2019-02-19 DIAGNOSIS — J449 Chronic obstructive pulmonary disease, unspecified: Secondary | ICD-10-CM

## 2019-02-19 DIAGNOSIS — C3491 Malignant neoplasm of unspecified part of right bronchus or lung: Secondary | ICD-10-CM

## 2019-02-19 DIAGNOSIS — Z79899 Other long term (current) drug therapy: Secondary | ICD-10-CM | POA: Diagnosis not present

## 2019-02-19 DIAGNOSIS — Z5112 Encounter for antineoplastic immunotherapy: Secondary | ICD-10-CM | POA: Diagnosis not present

## 2019-02-19 DIAGNOSIS — C3411 Malignant neoplasm of upper lobe, right bronchus or lung: Secondary | ICD-10-CM | POA: Diagnosis not present

## 2019-02-19 DIAGNOSIS — Z5111 Encounter for antineoplastic chemotherapy: Secondary | ICD-10-CM | POA: Diagnosis not present

## 2019-02-19 LAB — CMP (CANCER CENTER ONLY)
ALT: 10 U/L (ref 0–44)
AST: 17 U/L (ref 15–41)
Albumin: 3.6 g/dL (ref 3.5–5.0)
Alkaline Phosphatase: 62 U/L (ref 38–126)
Anion gap: 8 (ref 5–15)
BUN: 8 mg/dL (ref 8–23)
CO2: 29 mmol/L (ref 22–32)
Calcium: 9.1 mg/dL (ref 8.9–10.3)
Chloride: 102 mmol/L (ref 98–111)
Creatinine: 0.97 mg/dL (ref 0.61–1.24)
GFR, Est AFR Am: >60 mL/min (ref >60)
GFR, Estimated: >60 mL/min (ref >60)
Glucose, Bld: 99 mg/dL (ref 70–99)
Potassium: 3.7 mmol/L (ref 3.5–5.1)
Sodium: 139 mmol/L (ref 135–145)
Total Bilirubin: <0.2 mg/dL — ABNORMAL LOW (ref 0.3–1.2)
Total Protein: 7.2 g/dL (ref 6.5–8.1)

## 2019-02-19 LAB — CBC WITH DIFFERENTIAL (CANCER CENTER ONLY)
Abs Immature Granulocytes: 0.02 10*3/uL (ref 0.00–0.07)
Basophils Absolute: 0 10*3/uL (ref 0.0–0.1)
Basophils Relative: 0 %
Eosinophils Absolute: 0 10*3/uL (ref 0.0–0.5)
Eosinophils Relative: 0 %
HCT: 32.1 % — ABNORMAL LOW (ref 39.0–52.0)
Hemoglobin: 10.3 g/dL — ABNORMAL LOW (ref 13.0–17.0)
Immature Granulocytes: 0 %
Lymphocytes Relative: 24 %
Lymphs Abs: 1.6 10*3/uL (ref 0.7–4.0)
MCH: 28.5 pg (ref 26.0–34.0)
MCHC: 32.1 g/dL (ref 30.0–36.0)
MCV: 88.7 fL (ref 80.0–100.0)
Monocytes Absolute: 0.8 10*3/uL (ref 0.1–1.0)
Monocytes Relative: 11 %
Neutro Abs: 4.3 10*3/uL (ref 1.7–7.7)
Neutrophils Relative %: 65 %
Platelet Count: 279 10*3/uL (ref 150–400)
RBC: 3.62 MIL/uL — ABNORMAL LOW (ref 4.22–5.81)
RDW: 19.5 % — ABNORMAL HIGH (ref 11.5–15.5)
WBC Count: 6.7 10*3/uL (ref 4.0–10.5)
nRBC: 0 % (ref 0.0–0.2)

## 2019-02-19 MED ORDER — FAMOTIDINE IN NACL 20-0.9 MG/50ML-% IV SOLN
20.0000 mg | Freq: Once | INTRAVENOUS | Status: AC
  Administered 2019-02-19: 20 mg via INTRAVENOUS

## 2019-02-19 MED ORDER — SODIUM CHLORIDE 0.9 % IV SOLN
1.0000 mg/kg | Freq: Once | INTRAVENOUS | Status: AC
  Administered 2019-02-19: 70 mg via INTRAVENOUS
  Filled 2019-02-19: qty 14

## 2019-02-19 MED ORDER — SODIUM CHLORIDE 0.9 % IV SOLN
360.0000 mg | Freq: Once | INTRAVENOUS | Status: AC
  Administered 2019-02-19: 12:00:00 360 mg via INTRAVENOUS
  Filled 2019-02-19: qty 10

## 2019-02-19 MED ORDER — SODIUM CHLORIDE 0.9% FLUSH
10.0000 mL | INTRAVENOUS | Status: DC | PRN
  Administered 2019-02-19: 10 mL
  Filled 2019-02-19: qty 10

## 2019-02-19 MED ORDER — DIPHENHYDRAMINE HCL 50 MG/ML IJ SOLN
25.0000 mg | Freq: Once | INTRAMUSCULAR | Status: AC
  Administered 2019-02-19: 25 mg via INTRAVENOUS

## 2019-02-19 MED ORDER — DIPHENHYDRAMINE HCL 50 MG/ML IJ SOLN
INTRAMUSCULAR | Status: AC
  Filled 2019-02-19: qty 1

## 2019-02-19 MED ORDER — FAMOTIDINE IN NACL 20-0.9 MG/50ML-% IV SOLN
INTRAVENOUS | Status: AC
  Filled 2019-02-19: qty 50

## 2019-02-19 MED ORDER — HEPARIN SOD (PORK) LOCK FLUSH 100 UNIT/ML IV SOLN
500.0000 [IU] | Freq: Once | INTRAVENOUS | Status: AC | PRN
  Administered 2019-02-19: 500 [IU]
  Filled 2019-02-19: qty 5

## 2019-02-19 MED ORDER — SODIUM CHLORIDE 0.9 % IV SOLN
Freq: Once | INTRAVENOUS | Status: AC
  Administered 2019-02-19: 11:00:00 via INTRAVENOUS
  Filled 2019-02-19: qty 250

## 2019-02-19 NOTE — Progress Notes (Signed)
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  He has disease recurrence in August 2020.  PRIOR THERAPY: 1) Course of concurrent chemoradiation with weekly carboplatin for AUC of 2 and paclitaxel 45 MG/M2. First dose 03/01/2015. He is status post 6 cycles. Last cycle was given 04/12/2015 with partial response. 2) Consolidation chemotherapy with reduced dose carboplatin for AUC of 5 and paclitaxel 175 MG/M2 every 3 weeks, status post 3 cycles.  CURRENT THERAPY:  Systemic chemotherapy with carboplatin for AUC of 5, paclitaxel 175 mg/M2 with nivolumab 360 mg IV every 3 weeks in addition to ipilimumab 1 mg/KG every 6 weeks.  First dose 01/08/2019.  Status post 1 cycle.  INTERVAL HISTORY: Ivan Daniels 73 y.o. male returns to the clinic for a follow-up visit.  The patient recently had evidence of disease recurrence after being on observation for 3 years.  He is currently undergoing chemotherapy and immunotherapy with carboplatin, paclitaxel, nivolumab, and ipilimumab.  He had 2 doses of carboplatin and paclitaxel in addition to immunotherapy.  Starting from today, he will start the maintenance phase of his treatment with immunotherapy only with nivolumab IV every 3 weeks and ipilimumab IV every 6 weeks.  He tolerated his first cycle of treatment well without any adverse side effects except for some mild fatigue as well as mild diarrhea.  The patient states approximately a week and a half ago he had 2 days worth of 2 loose bowel movements per day.  This resolved without any intervention.  He denies any abdominal pain, hematochezia, melena,  nausea, or vomiting.   Otherwise, the patient is feeling well today.  He denies any headache or visual changes.  He denies any rashes or skin changes.  He denies any chest pain, shortness of breath, cough, or hemoptysis.  He is here today for evaluation before starting day 1 of cycle #2.  MEDICAL HISTORY: Past Medical History:  Diagnosis Date  . ALCOHOL ABUSE 11/12/2009   Qualifier: Diagnosis of  By: Hassell Done FNP, Tori Milks    . Allergy   . Anemia   . Anxiety   . BENIGN PROSTATIC HYPERTROPHY, HX OF 12/19/2006   Qualifier: Diagnosis of  By: Radene Ou MD, [place]    . Blood transfusion without reported diagnosis 2017  . Clotting disorder ([place])    takes PLAVIX  . Depression   . Emphysema of lung ([place])   . Full code status 02/10/2015  . GERD (gastroesophageal reflux disease)   . Hyperlipidemia   . Lung mass 01/25/2015   3.7 x 3.6 cm RUL spiculated lung mass with 2.0 cm right paratracheal lymph node suspicious for bronchogenic CA  . Non-small cell lung cancer ([place]) 01/27/15   non small-cell,squamous cell ca RUL  . Paranoid schizophrenia ([place]) 10/31/2009   Qualifier: Diagnosis of  By: Jorene Minors, [place]    . Primary spontaneous pneumothorax, left 01/25/2015  . TOBACCO ABUSE 05/24/2009   Qualifier: Diagnosis of  By: Hassell Done FNP, [place]      ALLERGIES:  is allergic to benztropine mesylate and chlorpromazine hcl.  MEDICATIONS:  Current Outpatient Medications  Medication Sig Dispense Refill  . ALPHAGAN P 0.1 % SOLN Place 1 drop into both eyes 3 (three)  times daily.  6  . aspirin EC 81 MG tablet Take 81 mg by mouth 2 (two) times daily.    . benztropine (COGENTIN) 0.5 MG tablet Take 0.5 mg by mouth at bedtime.     . chlorhexidine (PERIDEX) 0.12 % solution 1 mL by Mouth Rinse route as needed.   1  . clopidogrel (PLAVIX) 75 MG tablet Take 75 mg by mouth daily.     . ferrous fumarate (HEMOCYTE - 106 MG FE) 325 (106 FE) MG TABS tablet Take 1 tablet by mouth daily.     . fluconazole (DIFLUCAN) 100 MG  tablet Take 1 tablet (100 mg total) by mouth daily. 7 tablet 0  . haloperidol (HALDOL) 5 MG tablet Take 5 mg by mouth at bedtime.     . Multiple Vitamins-Minerals (MULTIVITAMIN WITH MINERALS) tablet Take 1 tablet by mouth daily.    . NONFORMULARY OR COMPOUNDED [employer]  Combination Pain Cream -  Baclofen 2%, Doxepin 5%, Gabapentin 6%, Topiramate 2%, Pentoxifylline 3% Apply 1-2 grams to affected area 3-4 times daily Qty. 120 gm 3 refills 1 each 3  . ondansetron (ZOFRAN) 8 MG tablet Take 1 tablet (8 mg total) by mouth every 8 (eight) hours as needed for nausea or vomiting. 20 tablet 0  . simvastatin (ZOCOR) 20 MG tablet Take 20 mg by mouth at bedtime.     Marland Kitchen umeclidinium-vilanterol (ANORO ELLIPTA) 62.5-25 MCG/INH AEPB Inhale 1 puff into the lungs daily. 1 each 11   Current Facility-Administered Medications  Medication Dose Route Frequency Provider Last Rate Last Dose  . 0.9 %  sodium chloride infusion  500 mL Intravenous Continuous Irene Shipper, MD      . 0.9 %  sodium chloride infusion  500 mL Intravenous Continuous Irene Shipper, MD      . 0.9 %  sodium chloride infusion  500 mL Intravenous Continuous Irene Shipper, MD       Facility-Administered Medications Ordered in Other Visits  Medication Dose Route Frequency Provider Last Rate Last Dose  . heparin lock flush 100 unit/mL  500 Units Intracatheter Once PRN Curt Bears, MD      . ipilimumab (YERVOY) 70 mg in sodium chloride 0.9 % 50 mL chemo infusion  1 mg/kg (Treatment Plan Recorded) Intravenous Once Curt Bears, MD      . nivolumab (OPDIVO) 360 mg in sodium chloride 0.9 % 100 mL chemo infusion  360 mg Intravenous Once Curt Bears, MD      . sodium chloride flush (NS) 0.9 % injection 10 mL  10 mL Intracatheter PRN Curt Bears, MD        SURGICAL HISTORY:  Past Surgical History:  Procedure Laterality Date  . APPENDECTOMY    . CHEST TUBE INSERTION Left   . COLONOSCOPY    . INSERTION CENTRAL VENOUS  ACCESS DEVICE W/ SUBCUTANEOUS PORT Right   . LUNG BIOPSY Right 01/27/2015   Procedure: Right Upper Lobe Bronchus BIOPSY;  Surgeon: Grace Isaac, MD;  Location: [place] [facility];  Service: Thoracic;  Laterality: Right;  Marland Kitchen VIDEO BRONCHOSCOPY WITH ENDOBRONCHIAL ULTRASOUND N/A 01/27/2015   Procedure: VIDEO BRONCHOSCOPY WITH ENDOBRONCHIAL ULTRASOUND;  Surgeon: Grace Isaac, MD;  Location: [facility];  Service: Thoracic;  Laterality: N/A;    REVIEW OF SYSTEMS:   Review of Systems  Constitutional: Positive for fatigue. Negative for appetite change, chills,  fever and unexpected weight change.  HENT: Negative for mouth sores, nosebleeds, sore throat and trouble swallowing.   Eyes: Negative for  eye problems and icterus.  Respiratory: Negative for cough, hemoptysis, shortness of breath and wheezing.   Cardiovascular: Negative for chest pain and leg swelling.  Gastrointestinal: Positive for mild diarrhea (resolved at this time). Negative for abdominal pain, constipation, nausea and vomiting.  Genitourinary: Negative for bladder incontinence, difficulty urinating, dysuria, frequency and hematuria.   Musculoskeletal: Negative for back pain, gait problem, neck pain and neck stiffness.  Skin: Negative for itching and rash.  Neurological: Negative for dizziness, extremity weakness, gait problem, headaches, light-headedness and seizures.  Hematological: Negative for adenopathy. Does not bruise/bleed easily.  Psychiatric/Behavioral: Negative for confusion, depression and sleep disturbance. The patient is not nervous/anxious.     PHYSICAL EXAMINATION:  Blood pressure 138/81, pulse (!) 105, temperature 98.3 F (36.8 C), temperature source Temporal, resp. rate 18, height 5\' 11"  (1.803 m), weight 145 lb 12.8 oz (66.1 kg), SpO2 100 %.  ECOG PERFORMANCE STATUS: 0 - Asymptomatic  Physical Exam  Constitutional: Oriented to person, place, and time and well-developed, well-nourished, and in no distress.  HENT:  Head:  Normocephalic and atraumatic.  Mouth/Throat: Oropharynx is clear and moist. No oropharyngeal exudate.  Eyes: Conjunctivae are normal. Right eye exhibits no discharge. Left eye exhibits no discharge. No scleral icterus.  Neck: Normal range of motion. Neck supple.  Cardiovascular: Normal rate, regular rhythm, normal heart sounds and intact distal pulses.   Pulmonary/Chest: Effort normal and breath sounds normal. No respiratory distress. No wheezes. No rales.  Abdominal: Soft. Bowel sounds are normal. Exhibits no distension and no mass. There is no tenderness.  Musculoskeletal: Normal range of motion. Exhibits no edema.  Lymphadenopathy:    No cervical adenopathy.  Neurological: Alert and oriented to person, place, and time. Exhibits normal muscle tone. Gait normal. Coordination normal.  Skin: Skin is warm and dry. No rash noted. Not diaphoretic. No erythema. No pallor.  Psychiatric: Mood, memory and judgment normal.  Vitals reviewed.  LABORATORY DATA: Lab Results  Component Value Date   WBC 6.7 02/19/2019   HGB 10.3 (L) 02/19/2019   HCT 32.1 (L) 02/19/2019   MCV 88.7 02/19/2019   PLT 279 02/19/2019      Chemistry      Component Value Date/Time   NA 139 02/19/2019 1013   NA 140 02/08/2017 0822   K 3.7 02/19/2019 1013   K 4.5 02/08/2017 0822   CL 102 02/19/2019 1013   CO2 29 02/19/2019 1013   CO2 25 02/08/2017 0822   BUN 8 02/19/2019 1013   BUN 8.8 02/08/2017 0822   CREATININE 0.97 02/19/2019 1013   CREATININE 1.1 02/08/2017 0822      Component Value Date/Time   CALCIUM 9.1 02/19/2019 1013   CALCIUM 9.6 02/08/2017 0822   ALKPHOS 62 02/19/2019 1013   ALKPHOS 53 02/08/2017 0822   AST 17 02/19/2019 1013   AST 19 02/08/2017 0822   ALT 10 02/19/2019 1013   ALT 9 02/08/2017 0822   BILITOT <0.2 (L) 02/19/2019 1013   BILITOT <0.22 02/08/2017 0822       RADIOGRAPHIC STUDIES:  No results found.   ASSESSMENT/PLAN:  This is a very pleasant 73 year old African-American  male initially diagnosed with stage IIIa non-small cell lung cancer, squamous cell carcinoma.  He was diagnosed in April 2016.  He presented with a right upper lobe lung mass and right hilar and mediastinal lymphadenopathy.   He is status post concurrent chemoradiation followed by consolidation chemotherapy.  He has been on observation for over 3 years.  The patient showed evidence of  disease recurrence in August 2020.  He is currently undergoing treatment with carboplatin for an AUC of 5, paclitaxel 175 mg/m in combination with immunotherapy with nivolumab 360 mg IV every 3 weeks and ipilimumab 1 mg/kg IV every 6 weeks.  The patient is status post cycle #1.  Starting from cycle #2, patient will be on maintenance treatment with immunotherapy with nivolumab 360 mg IV every 3 weeks and ipilimumab 1 mg/kg IV every 6 weeks for 2 years as long as the patient has no evidence of disease progression or unacceptable toxicity.  The patient tolerated the first cycle of his treatment well without any adverse side effects except for some fatigue as well as mild diarrhea which has since resolved.  The patient was seen with Dr. Julien Nordmann today.  Labs were reviewed.  We recommend he proceed with cycle #2 today as scheduled   I will arrange for the patient to have a restaging CT scan of his chest performed prior to his next cycle of immunotherapy.  We will see the patient back for follow-up visit in 3 weeks for evaluation and to review his scan results before starting day 21 of cycle 2.  For the patient's mild diarrhea, I encouraged the patient to pick up OTC Imodium to take for diarrhea.  If the patient experiences significant diarrhea, he was encouraged to please contact our office for further evaluation.  The patient was advised to call immediately if he has any concerning symptoms in the interval. The patient voices understanding of current disease status and treatment options and is in agreement with the current  care plan. All questions were answered. The patient knows to call the clinic with any problems, questions or concerns. We can certainly see the patient much sooner if necessary   Orders Placed This Encounter  Procedures  . CT Chest W Contrast    Standing Status:   Future    Standing Expiration Date:   03/17/2020    Order Specific Question:   ** REASON FOR EXAM (FREE TEXT)    Answer:   Restaging Lung Cancer    Order Specific Question:   If indicated for the ordered procedure, I authorize the administration of contrast media per Radiology protocol    Answer:   Yes    Order Specific Question:   Preferred imaging location?    Answer:   [facility]    Order Specific Question:   Radiology Contrast Protocol - do NOT remove file path    Answer:   [email or web address]  . CMP ([place] only)    Standing Status:   Standing    Number of Occurrences:   18    Standing Expiration Date:   03-17-2020  . CBC with Differential (Cancer Center Only)    Standing Status:   Standing    Number of Occurrences:   18    Standing Expiration Date:   March 17, 2020     Zacheriah Stumpe L Mickeal Daws, PA-C 02/19/19  ADDENDUM: Hematology/Oncology Attending: I had a face-to-face encounter with the patient today.  I recommended his care plan.  This is a very pleasant 72 years old African-American male with recurrent non-small cell lung cancer, squamous cell carcinoma.  The patient is currently undergoing treatment with a combination of immunotherapy with ipilimumab, nivolumab as well as 2 cycles of chemotherapy with carboplatin and paclitaxel.  He completed the first portion of this treatment including the systemic chemotherapy.  He has been tolerating his treatment well with no concerning adverse effects. Starting from  today he will be on treatment with ipilimumab 1 mg/KG every 6 weeks in addition to nivolumab 360 mg IV every 3 weeks.  I recommended for the patient to proceed with his  treatment today as planned. The patient will come back for follow-up visit in 3 weeks for evaluation with repeat CT scan of the chest for restaging of his disease. He was advised to call immediately if he has any concerning symptoms in the interval.  Disclaimer: This note was dictated with voice recognition software. Similar sounding words can inadvertently be transcribed and may be missed upon review. Eilleen Kempf, MD 02/19/19

## 2019-02-19 NOTE — Progress Notes (Signed)
Completed Yervoy Patient monitoring checklist 10/14//2020 all answers are no/ WDL. Baseline for pt is 1 bowel movement/day.

## 2019-02-19 NOTE — Telephone Encounter (Signed)
Scheduled appt per 10/14 los - pt sister is aware of cancelled appts for weekly labs

## 2019-02-19 NOTE — Patient Instructions (Signed)
[facility] Discharge Instructions for Patients Receiving Chemotherapy  Today you received the following chemotherapy agents Yervoy and Nivolumab  To help prevent nausea and vomiting after your treatment, we encourage you to take your nausea medication as directed   If you develop nausea and vomiting that is not controlled by your nausea medication, call the clinic.   BELOW ARE SYMPTOMS THAT SHOULD BE REPORTED IMMEDIATELY:  *FEVER GREATER THAN 100.5 F  *CHILLS WITH OR WITHOUT FEVER  NAUSEA AND VOMITING THAT IS NOT CONTROLLED WITH YOUR NAUSEA MEDICATION  *UNUSUAL SHORTNESS OF BREATH  *UNUSUAL BRUISING OR BLEEDING  TENDERNESS IN MOUTH AND THROAT WITH OR WITHOUT PRESENCE OF ULCERS  *URINARY PROBLEMS  *BOWEL PROBLEMS  UNUSUAL RASH Items with * indicate a potential emergency and should be followed up as soon as possible.  Feel free to call the clinic should you have any questions or concerns. The clinic phone number is [phone].  Please show the [place] at check-in to the Emergency Department and triage nurse.

## 2019-02-20 ENCOUNTER — Telehealth: Payer: Self-pay | Admitting: Medical Oncology

## 2019-02-20 NOTE — Telephone Encounter (Signed)
Sister called to report Ivan Daniels's scalp has been itching since a week ago. He is using Tar shampoo. I told her to switch to head and shoulders or S[place]un Blue and wash twice in the next 4 days and let us know next week about itching.

## 2019-02-26 ENCOUNTER — Other Ambulatory Visit: Payer: Medicare Other

## 2019-02-28 ENCOUNTER — Other Ambulatory Visit: Payer: Self-pay

## 2019-02-28 ENCOUNTER — Ambulatory Visit (INDEPENDENT_AMBULATORY_CARE_PROVIDER_SITE_OTHER): Payer: Medicare Other | Admitting: Podiatry

## 2019-02-28 ENCOUNTER — Encounter: Payer: Self-pay | Admitting: Podiatry

## 2019-02-28 DIAGNOSIS — Z89422 Acquired absence of other left toe(s): Secondary | ICD-10-CM

## 2019-02-28 DIAGNOSIS — Z89421 Acquired absence of other right toe(s): Secondary | ICD-10-CM | POA: Diagnosis not present

## 2019-02-28 DIAGNOSIS — Q828 Other specified congenital malformations of skin: Secondary | ICD-10-CM | POA: Diagnosis not present

## 2019-02-28 DIAGNOSIS — D689 Coagulation defect, unspecified: Secondary | ICD-10-CM

## 2019-02-28 NOTE — Progress Notes (Signed)
This patient presents to the office for treatment of his calluses which have developed on both feet following frostbite. He says these calluses are painful walking and wearing his shoes. He presents to the office for continued preventative foot care services. Patient is taking plavix.  Vascular  Dorsalis pedis and posterior tibial pulses are weakly  palpable  B/L.  Capillary return  WNL.  Temperature gradient is  WNL.  Skin turgor  WNL  Sensorium  Senn Weinstein monofilament wire  WNL. Normal tactile sensation.  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  Orthopedic  Exam  Muscle tone and muscle strength  WNL.  No limitations of motion feet  B/L.  No crepitus or joint effusion noted.  Foot type is unremarkable and digits show no abnormalities.  Amputation digits  B/L    Skin  No open lesions.  Normal skin texture and turgor. Porokeratosis sub 2 right.  Callus at distal aspect left foot.    Porokeratosis  B/L.  Debridement of porokeratosis/callus  B/L.  RTC 10 weeks.   Gardiner Barefoot DPM

## 2019-03-05 ENCOUNTER — Other Ambulatory Visit: Payer: Medicare Other

## 2019-03-10 ENCOUNTER — Other Ambulatory Visit: Payer: Self-pay

## 2019-03-10 ENCOUNTER — Ambulatory Visit (HOSPITAL_COMMUNITY)
Admission: RE | Admit: 2019-03-10 | Discharge: 2019-03-10 | Disposition: A | Discharge status: Home / Self Care | Payer: Medicare Other | Source: Ambulatory Visit | Attending: Physician Assistant | Admitting: Physician Assistant

## 2019-03-10 DIAGNOSIS — C3411 Malignant neoplasm of upper lobe, right bronchus or lung: Secondary | ICD-10-CM | POA: Insufficient documentation

## 2019-03-10 DIAGNOSIS — C3490 Malignant neoplasm of unspecified part of unspecified bronchus or lung: Secondary | ICD-10-CM | POA: Diagnosis not present

## 2019-03-10 MED ORDER — HEPARIN SOD (PORK) LOCK FLUSH 100 UNIT/ML IV SOLN
INTRAVENOUS | Status: AC
  Administered 2019-03-10: 500 [IU] via INTRAVENOUS
  Filled 2019-03-10: qty 5

## 2019-03-10 MED ORDER — HEPARIN SOD (PORK) LOCK FLUSH 100 UNIT/ML IV SOLN
500.0000 [IU] | Freq: Once | INTRAVENOUS | Status: AC
  Administered 2019-03-10: 15:00:00 500 [IU] via INTRAVENOUS

## 2019-03-10 MED ORDER — SODIUM CHLORIDE (PF) 0.9 % IJ SOLN
INTRAMUSCULAR | Status: AC
  Filled 2019-03-10: qty 50

## 2019-03-10 MED ORDER — IOHEXOL 300 MG/ML  SOLN
75.0000 mL | Freq: Once | INTRAMUSCULAR | Status: AC | PRN
  Administered 2019-03-10: 15:00:00 75 mL via INTRAVENOUS

## 2019-03-12 ENCOUNTER — Inpatient Hospital Stay: Payer: Medicare Other

## 2019-03-12 ENCOUNTER — Inpatient Hospital Stay: Payer: Medicare Other | Attending: Internal Medicine | Admitting: Internal Medicine

## 2019-03-12 ENCOUNTER — Encounter: Payer: Self-pay | Admitting: Internal Medicine

## 2019-03-12 ENCOUNTER — Other Ambulatory Visit: Payer: Self-pay

## 2019-03-12 VITALS — HR 103

## 2019-03-12 VITALS — BP 143/70 | HR 111 | Temp 98.0°F | Resp 17 | Ht 71.0 in | Wt 145.1 lb

## 2019-03-12 DIAGNOSIS — C3491 Malignant neoplasm of unspecified part of right bronchus or lung: Secondary | ICD-10-CM

## 2019-03-12 DIAGNOSIS — Z5112 Encounter for antineoplastic immunotherapy: Secondary | ICD-10-CM | POA: Insufficient documentation

## 2019-03-12 DIAGNOSIS — R5382 Chronic fatigue, unspecified: Secondary | ICD-10-CM

## 2019-03-12 DIAGNOSIS — Z79899 Other long term (current) drug therapy: Secondary | ICD-10-CM | POA: Insufficient documentation

## 2019-03-12 DIAGNOSIS — J449 Chronic obstructive pulmonary disease, unspecified: Secondary | ICD-10-CM

## 2019-03-12 DIAGNOSIS — C3411 Malignant neoplasm of upper lobe, right bronchus or lung: Secondary | ICD-10-CM | POA: Insufficient documentation

## 2019-03-12 LAB — CBC WITH DIFFERENTIAL (CANCER CENTER ONLY)
Abs Immature Granulocytes: 0.01 10*3/uL (ref 0.00–0.07)
Basophils Absolute: 0 10*3/uL (ref 0.0–0.1)
Basophils Relative: 0 %
Eosinophils Absolute: 0.1 10*3/uL (ref 0.0–0.5)
Eosinophils Relative: 1 %
HCT: 35.9 % — ABNORMAL LOW (ref 39.0–52.0)
Hemoglobin: 11.6 g/dL — ABNORMAL LOW (ref 13.0–17.0)
Immature Granulocytes: 0 %
Lymphocytes Relative: 28 %
Lymphs Abs: 1.4 10*3/uL (ref 0.7–4.0)
MCH: 28.7 pg (ref 26.0–34.0)
MCHC: 32.3 g/dL (ref 30.0–36.0)
MCV: 88.9 fL (ref 80.0–100.0)
Monocytes Absolute: 0.6 10*3/uL (ref 0.1–1.0)
Monocytes Relative: 11 %
Neutro Abs: 3.1 10*3/uL (ref 1.7–7.7)
Neutrophils Relative %: 60 %
Platelet Count: 238 10*3/uL (ref 150–400)
RBC: 4.04 MIL/uL — ABNORMAL LOW (ref 4.22–5.81)
RDW: 18.7 % — ABNORMAL HIGH (ref 11.5–15.5)
WBC Count: 5.1 10*3/uL (ref 4.0–10.5)
nRBC: 0 % (ref 0.0–0.2)

## 2019-03-12 LAB — CMP (CANCER CENTER ONLY)
ALT: 10 U/L (ref 0–44)
AST: 19 U/L (ref 15–41)
Albumin: 3.9 g/dL (ref 3.5–5.0)
Alkaline Phosphatase: 55 U/L (ref 38–126)
Anion gap: 12 (ref 5–15)
BUN: 11 mg/dL (ref 8–23)
CO2: 25 mmol/L (ref 22–32)
Calcium: 9.6 mg/dL (ref 8.9–10.3)
Chloride: 103 mmol/L (ref 98–111)
Creatinine: 1.02 mg/dL (ref 0.61–1.24)
GFR, Est AFR Am: >60 mL/min (ref >60)
GFR, Estimated: >60 mL/min (ref >60)
Glucose, Bld: 109 mg/dL — ABNORMAL HIGH (ref 70–99)
Potassium: 3.9 mmol/L (ref 3.5–5.1)
Sodium: 140 mmol/L (ref 135–145)
Total Bilirubin: 0.2 mg/dL — ABNORMAL LOW (ref 0.3–1.2)
Total Protein: 7.7 g/dL (ref 6.5–8.1)

## 2019-03-12 LAB — TSH: TSH: 3.465 u[IU]/mL (ref 0.320–4.118)

## 2019-03-12 MED ORDER — SODIUM CHLORIDE 0.9 % IV SOLN
360.0000 mg | Freq: Once | INTRAVENOUS | Status: AC
  Administered 2019-03-12: 360 mg via INTRAVENOUS
  Filled 2019-03-12: qty 36

## 2019-03-12 MED ORDER — HEPARIN SOD (PORK) LOCK FLUSH 100 UNIT/ML IV SOLN
500.0000 [IU] | Freq: Once | INTRAVENOUS | Status: AC | PRN
  Administered 2019-03-12: 500 [IU]
  Filled 2019-03-12: qty 5

## 2019-03-12 MED ORDER — SODIUM CHLORIDE 0.9% FLUSH
10.0000 mL | INTRAVENOUS | Status: DC | PRN
  Administered 2019-03-12: 10 mL
  Filled 2019-03-12: qty 10

## 2019-03-12 MED ORDER — SODIUM CHLORIDE 0.9% FLUSH
10.0000 mL | INTRAVENOUS | Status: DC | PRN
  Administered 2019-03-12: 11:00:00 10 mL
  Filled 2019-03-12: qty 10

## 2019-03-12 MED ORDER — SODIUM CHLORIDE 0.9 % IV SOLN
Freq: Once | INTRAVENOUS | Status: AC
  Administered 2019-03-12: 12:00:00 via INTRAVENOUS
  Filled 2019-03-12: qty 250

## 2019-03-12 NOTE — Progress Notes (Signed)
Ok to treat with HR 103 per MD [facility]

## 2019-03-12 NOTE — Patient Instructions (Signed)
[facility] Discharge Instructions for Patients Receiving Chemotherapy  Today you received the following chemotherapy agents Opdivo  To help prevent nausea and vomiting after your treatment, we encourage you to take your nausea medication as directed   If you develop nausea and vomiting that is not controlled by your nausea medication, call the clinic.   BELOW ARE SYMPTOMS THAT SHOULD BE REPORTED IMMEDIATELY:  *FEVER GREATER THAN 100.5 F  *CHILLS WITH OR WITHOUT FEVER  NAUSEA AND VOMITING THAT IS NOT CONTROLLED WITH YOUR NAUSEA MEDICATION  *UNUSUAL SHORTNESS OF BREATH  *UNUSUAL BRUISING OR BLEEDING  TENDERNESS IN MOUTH AND THROAT WITH OR WITHOUT PRESENCE OF ULCERS  *URINARY PROBLEMS  *BOWEL PROBLEMS  UNUSUAL RASH Items with * indicate a potential emergency and should be followed up as soon as possible.  Feel free to call the clinic should you have any questions or concerns. The clinic phone number is [phone].  Please show the CHEMO ALERT CARD at check-in to the Emergency Department and triage nurse.   

## 2019-03-12 NOTE — Progress Notes (Signed)
[facility] [phone]   [phone]  OFFICE PROGRESS NOTE  [name], FNP 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  He has disease recurrence in August 2020.  PRIOR THERAPY:  1) Course of concurrent chemoradiation with weekly carboplatin for AUC of 2 and paclitaxel 45 MG/M2. First dose 03/01/2015. He is status post 6 cycles. Last cycle was given 04/12/2015 with partial response. 2) Consolidation chemotherapy with reduced dose carboplatin for AUC of 5 and paclitaxel 175 MG/M2 every 3 weeks, status post 3 cycles.  CURRENT THERAPY: Systemic chemotherapy with carboplatin for AUC of 5, paclitaxel 175 mg/M2 with nivolumab 360 mg IV every 3 weeks in addition to ipilimumab 1 mg/KG every 6 weeks.  First dose 01/08/2019.  Status post 2 cycles.  INTERVAL HISTORY: Ivan Daniels 73 y.o. male returns to the clinic today for follow-up visit accompanied by his sister.  The patient is feeling fine today with no concerning complaints.  He had 1 or 2 episodes of diarrhea after the first week of his treatment.  He denied having any current chest pain, shortness of breath, cough or hemoptysis.  He denied having any fever or chills.  He has no nausea, vomiting, diarrhea or constipation. He has no headache or visual changes.  The patient has no recent weight loss or night sweats.  He has been tolerating his treatment fairly well.  He had repeat CT scan of the chest performed recently and he is here for evaluation and discussion of his scan results.   MEDICAL HISTORY: Past Medical History:  Diagnosis Date   ALCOHOL ABUSE 11/12/2009   Qualifier: Diagnosis of  By:  Hassell Done FNP, [place]     Allergy    Anemia    Anxiety    BENIGN PROSTATIC HYPERTROPHY, HX OF 12/19/2006   Qualifier: Diagnosis of  By: Radene Ou MD, [place]     Blood transfusion without reported diagnosis 2017   Clotting disorder ([facility])    takes PLAVIX   Depression    Emphysema of lung ([facility])    Full code status 02/10/2015   GERD (gastroesophageal reflux disease)    Hyperlipidemia    Lung mass 01/25/2015   3.7 x 3.6 cm RUL spiculated lung mass with 2.0 cm right paratracheal lymph node suspicious for bronchogenic CA   Non-small cell lung cancer ([facility]) 01/27/15   non small-cell,squamous cell ca RUL   Paranoid schizophrenia ([facility]) 10/31/2009   Qualifier: Diagnosis of  By: Jorene Minors, [place]     Primary spontaneous pneumothorax, left 01/25/2015   TOBACCO ABUSE 05/24/2009   Qualifier: Diagnosis of  By: Hassell Done FNP, Tori Milks      ALLERGIES:  is allergic to benztropine mesylate and chlorpromazine hcl.  MEDICATIONS:  Current Outpatient Medications  Medication Sig Dispense Refill   ALPHAGAN P 0.1 % SOLN Place 1 drop into both eyes 3 (three) times daily.  6   aspirin EC 81 MG tablet Take 81 mg by mouth 2 (two) times daily.     benztropine (COGENTIN) 0.5 MG tablet Take 0.5 mg by mouth at bedtime.      chlorhexidine (PERIDEX) 0.12 % solution 1 mL by Mouth Rinse route as needed.   1   clopidogrel (PLAVIX) 75  MG tablet Take 75 mg by mouth daily.      ferrous fumarate (HEMOCYTE - 106 MG FE) 325 (106 FE) MG TABS tablet Take 1 tablet by mouth daily.      fluconazole (DIFLUCAN) 100 MG tablet Take 1 tablet (100 mg total) by mouth daily. 7 tablet 0   haloperidol (HALDOL) 5 MG tablet Take 5 mg by mouth at bedtime.      Multiple Vitamins-Minerals (MULTIVITAMIN WITH MINERALS) tablet Take 1 tablet by mouth daily.     NONFORMULARY OR COMPOUNDED [place]  Combination Pain Cream -  Baclofen 2%, Doxepin 5%, Gabapentin 6%, Topiramate 2%, Pentoxifylline 3% Apply 1-2 grams to  affected area 3-4 times daily Qty. 120 gm 3 refills 1 each 3   ondansetron (ZOFRAN) 8 MG tablet Take 1 tablet (8 mg total) by mouth every 8 (eight) hours as needed for nausea or vomiting. 20 tablet 0   simvastatin (ZOCOR) 20 MG tablet Take 20 mg by mouth at bedtime.      umeclidinium-vilanterol (ANORO ELLIPTA) 62.5-25 MCG/INH AEPB Inhale 1 puff into the lungs daily. 1 each 11   Current Facility-Administered Medications  Medication Dose Route Frequency Provider Last Rate Last Dose   0.9 %  sodium chloride infusion  500 mL Intravenous Continuous Irene Shipper, MD       0.9 %  sodium chloride infusion  500 mL Intravenous Continuous Irene Shipper, MD       0.9 %  sodium chloride infusion  500 mL Intravenous Continuous Irene Shipper, MD        SURGICAL HISTORY:  Past Surgical History:  Procedure Laterality Date   APPENDECTOMY     CHEST TUBE INSERTION Left    COLONOSCOPY     INSERTION CENTRAL VENOUS ACCESS DEVICE W/ SUBCUTANEOUS PORT Right    LUNG BIOPSY Right 01/27/2015   Procedure: Right Upper Lobe Bronchus BIOPSY;  Surgeon: Grace Isaac, MD;  Location: [facility];  Service: Thoracic;  Laterality: Right;   VIDEO BRONCHOSCOPY WITH ENDOBRONCHIAL ULTRASOUND N/A 01/27/2015   Procedure: VIDEO BRONCHOSCOPY WITH ENDOBRONCHIAL ULTRASOUND;  Surgeon: Grace Isaac, MD;  Location: [facility];  Service: Thoracic;  Laterality: N/A;    REVIEW OF SYSTEMS:  Constitutional: positive for fatigue Eyes: negative Ears, nose, mouth, throat, and face: negative Respiratory: negative Cardiovascular: negative Gastrointestinal: negative Genitourinary:negative Integument/breast: negative Hematologic/lymphatic: negative Musculoskeletal:negative Neurological: negative Behavioral/Psych: negative Endocrine: negative Allergic/Immunologic: negative   PHYSICAL EXAMINATION: General appearance: alert, cooperative, fatigued and no distress Head: Normocephalic, without obvious abnormality, atraumatic Neck:  no adenopathy, no JVD, supple, symmetrical, trachea midline and thyroid not enlarged, symmetric, no tenderness/mass/nodules Lymph nodes: Cervical, supraclavicular, and axillary nodes normal. Resp: clear to auscultation bilaterally Back: symmetric, no curvature. ROM normal. No CVA tenderness. Cardio: regular rate and rhythm, S1, S2 normal, no murmur, click, rub or gallop GI: soft, non-tender; bowel sounds normal; no masses,  no organomegaly Extremities: extremities normal, atraumatic, no cyanosis or edema Neurologic: Alert and oriented X 3, normal strength and tone. Normal symmetric reflexes. Normal coordination and gait  ECOG PERFORMANCE STATUS: 1 - Symptomatic but completely ambulatory  Blood pressure (!) 143/70, pulse (!) 111, temperature 98 F (36.7 C), temperature source Temporal, resp. rate 17, height 5\' 11"  (1.803 m), weight 145 lb 1.6 oz (65.8 kg), SpO2 100 %.  LABORATORY DATA: Lab Results  Component Value Date   WBC 5.1 03/12/2019   HGB 11.6 (L) 03/12/2019   HCT 35.9 (L) 03/12/2019   MCV 88.9 03/12/2019   PLT  238 03/12/2019      Chemistry      Component Value Date/Time   NA 139 02/19/2019 1013   NA 140 02/08/2017 0822   K 3.7 02/19/2019 1013   K 4.5 02/08/2017 0822   CL 102 02/19/2019 1013   CO2 29 02/19/2019 1013   CO2 25 02/08/2017 0822   BUN 8 02/19/2019 1013   BUN 8.8 02/08/2017 0822   CREATININE 0.97 02/19/2019 1013   CREATININE 1.1 02/08/2017 0822      Component Value Date/Time   CALCIUM 9.1 02/19/2019 1013   CALCIUM 9.6 02/08/2017 0822   ALKPHOS 62 02/19/2019 1013   ALKPHOS 53 02/08/2017 0822   AST 17 02/19/2019 1013   AST 19 02/08/2017 0822   ALT 10 02/19/2019 1013   ALT 9 02/08/2017 0822   BILITOT <0.2 (L) 02/19/2019 1013   BILITOT <0.22 02/08/2017 6045       RADIOGRAPHIC STUDIES: Ct Chest W Contrast  Result Date: 03/10/2019 CLINICAL DATA:  Restaging lung cancer EXAM: CT CHEST WITH CONTRAST TECHNIQUE: Multidetector CT imaging of the chest was  performed during intravenous contrast administration. CONTRAST:  79mL OMNIPAQUE IOHEXOL 300 MG/ML  SOLN COMPARISON:  PET-CT, 12/25/2018, CT chest, 12/16/2018, 06/17/2018, 12/18/2017 FINDINGS: Cardiovascular: Right chest port catheter. Aortic atherosclerosis. Normal heart size. Three-vessel coronary artery calcifications. No pericardial effusion. Mediastinum/Nodes: Unchanged soft tissue thickening about the right hilum and mainstem bronchus (series 2, image 47). No discretely enlarged mediastinal, hilar, or axillary lymph nodes. Thyroid gland, trachea, and esophagus demonstrate no significant findings. Lungs/Pleura: FDG avid soft tissue identified anteriorly on prior PET-CT is decreased in size and rim enhancement, now measuring approximately 4.1 x 2.7 x 3.6 cm, previously 4.2 x 2.7 x 4.9 cm when measured similarly (series 2, image 46, series 5, image 54). There is redemonstrated surrounding dense post treatment consolidation/fibrosis the right upper lobe and a small, loculated component of pleural fluid posteriorly. There are new irregular, adjacent spiculated nodules of the lateral right lower lobe, the largest measuring 1.4 x 1.3 cm (series 7, image 101). Near-total resolution of a previously seen left upper lobe pulmonary nodule, now subsolid measuring 3 mm (series 7, image 75). Paraseptal emphysema. Upper Abdomen: No acute abnormality. Low-attenuation liver lesions, unchanged compared to prior examinations, likely simple cysts. Musculoskeletal: No chest wall mass or suspicious bone lesions identified. Relative osteopenia of T5-T8, likely related to radiation port. IMPRESSION: 1. FDG avid soft tissue identified anteriorly on prior PET-CT is decreased in size and rim enhancement, now measuring approximately 4.1 x 2.7 x 3.6 cm, previously 4.2 x 2.7 x 4.9 cm when measured similarly (series 2, image 46, series 5, image 54). There is redemonstrated surrounding dense post treatment consolidation/fibrosis the right  upper lobe and a small, loculated component of pleural fluid posteriorly. Findings are consistent with treatment response of recurrent malignancy. 2. Near-total resolution of a previously seen left upper lobe pulmonary nodule, now subsolid measuring 3 mm (series 7, image 75). Findings are consistent response of a metastatic nodule or alternately resolution of incidental infection or inflammation. 3. There are new irregular, adjacent spiculated nodules of the lateral right lower lobe, the largest measuring 1.4 x 1.3 cm (series 7, image 101). Appearance and distribution are generally most consistent with infection or inflammation, although metastatic disease is not strictly excluded. Attention on follow-up. 4.  Emphysema (ICD10-J43.9). 5.  Coronary artery disease.  Aortic Atherosclerosis (ICD10-I70.0). Electronically Signed   By: Eddie Candle M.D.   On: 03/10/2019 15:42    ASSESSMENT AND PLAN:  This is a very pleasant 73 years old African-American male with a stage IIIA non-small cell lung cancer, squamous cell carcinoma status post concurrent chemoradiation followed by consolidation chemotherapy. The patient has been in observation for close to 3 years. He had evidence for disease recurrence on the recent imaging studies including CT scan of the chest as well as PET scan. The patient is started  treatment with systemic chemotherapy with carboplatin for AUC of 5, paclitaxel 175 mg/M2 as well as combination of immunotherapy with nivolumab 360 mg IV every 3 weeks and ipilimumab 1 mg/KG every 6 weeks.  The chemotherapy will be given only for 2 cycles and then the patient will be maintained on immunotherapy with nivolumab and ipilimumab for a total of 2 years if he has no evidence for disease progression or unacceptable toxicity.  He completed 1 cycle of this treatment and he is currently undergoing cycle #2. He has been tolerating this treatment well with no concerning adverse effects. He had repeat CT scan of  the chest performed recently.  I personally and independently reviewed the scans and discussed the results with the patient and his sister today. Has a scan showed no concerning findings for disease progression. I recommended for the patient to continue his current treatment with immunotherapy as planned. He will come back for follow-up visit in 3 weeks for evaluation before starting day 1 of cycle #3. The patient was advised to call immediately if he has any concerning symptoms in the interval.  Disclaimer: This note was dictated with voice recognition software. Similar sounding words can inadvertently be transcribed and may not be corrected upon review.

## 2019-03-13 ENCOUNTER — Telehealth: Payer: Self-pay | Admitting: Internal Medicine

## 2019-03-13 NOTE — Telephone Encounter (Signed)
Scheduled per los. Called and left msg. Mailed printout  °

## 2019-03-19 ENCOUNTER — Other Ambulatory Visit: Payer: Medicare Other

## 2019-03-24 DIAGNOSIS — Z125 Encounter for screening for malignant neoplasm of prostate: Secondary | ICD-10-CM | POA: Diagnosis not present

## 2019-03-24 DIAGNOSIS — R351 Nocturia: Secondary | ICD-10-CM | POA: Diagnosis not present

## 2019-03-24 DIAGNOSIS — R972 Elevated prostate specific antigen [PSA]: Secondary | ICD-10-CM | POA: Diagnosis not present

## 2019-03-24 DIAGNOSIS — N401 Enlarged prostate with lower urinary tract symptoms: Secondary | ICD-10-CM | POA: Diagnosis not present

## 2019-03-26 ENCOUNTER — Other Ambulatory Visit: Payer: Medicare Other

## 2019-04-02 ENCOUNTER — Inpatient Hospital Stay: Payer: Medicare Other

## 2019-04-02 ENCOUNTER — Inpatient Hospital Stay (HOSPITAL_BASED_OUTPATIENT_CLINIC_OR_DEPARTMENT_OTHER): Payer: Medicare Other | Admitting: Internal Medicine

## 2019-04-02 ENCOUNTER — Encounter: Payer: Self-pay | Admitting: Internal Medicine

## 2019-04-02 ENCOUNTER — Other Ambulatory Visit: Payer: Self-pay

## 2019-04-02 VITALS — BP 126/78 | HR 102 | Temp 98.2°F | Resp 18 | Ht 71.0 in | Wt 147.1 lb

## 2019-04-02 DIAGNOSIS — C3411 Malignant neoplasm of upper lobe, right bronchus or lung: Secondary | ICD-10-CM

## 2019-04-02 DIAGNOSIS — Z79899 Other long term (current) drug therapy: Secondary | ICD-10-CM | POA: Diagnosis not present

## 2019-04-02 DIAGNOSIS — C3491 Malignant neoplasm of unspecified part of right bronchus or lung: Secondary | ICD-10-CM

## 2019-04-02 DIAGNOSIS — R5382 Chronic fatigue, unspecified: Secondary | ICD-10-CM

## 2019-04-02 DIAGNOSIS — J449 Chronic obstructive pulmonary disease, unspecified: Secondary | ICD-10-CM

## 2019-04-02 DIAGNOSIS — Z5112 Encounter for antineoplastic immunotherapy: Secondary | ICD-10-CM

## 2019-04-02 LAB — CMP (CANCER CENTER ONLY)
ALT: 10 U/L (ref 0–44)
AST: 18 U/L (ref 15–41)
Albumin: 3.8 g/dL (ref 3.5–5.0)
Alkaline Phosphatase: 49 U/L (ref 38–126)
Anion gap: 10 (ref 5–15)
BUN: 9 mg/dL (ref 8–23)
CO2: 27 mmol/L (ref 22–32)
Calcium: 9.2 mg/dL (ref 8.9–10.3)
Chloride: 103 mmol/L (ref 98–111)
Creatinine: 1.04 mg/dL (ref 0.61–1.24)
GFR, Est AFR Am: >60 mL/min (ref >60)
GFR, Estimated: >60 mL/min (ref >60)
Glucose, Bld: 129 mg/dL — ABNORMAL HIGH (ref 70–99)
Potassium: 3.6 mmol/L (ref 3.5–5.1)
Sodium: 140 mmol/L (ref 135–145)
Total Bilirubin: 0.3 mg/dL (ref 0.3–1.2)
Total Protein: 7.4 g/dL (ref 6.5–8.1)

## 2019-04-02 LAB — CBC WITH DIFFERENTIAL (CANCER CENTER ONLY)
Abs Immature Granulocytes: 0.01 10*3/uL (ref 0.00–0.07)
Basophils Absolute: 0 10*3/uL (ref 0.0–0.1)
Basophils Relative: 0 %
Eosinophils Absolute: 0.1 10*3/uL (ref 0.0–0.5)
Eosinophils Relative: 2 %
HCT: 36.8 % — ABNORMAL LOW (ref 39.0–52.0)
Hemoglobin: 11.8 g/dL — ABNORMAL LOW (ref 13.0–17.0)
Immature Granulocytes: 0 %
Lymphocytes Relative: 33 %
Lymphs Abs: 1.4 10*3/uL (ref 0.7–4.0)
MCH: 28.5 pg (ref 26.0–34.0)
MCHC: 32.1 g/dL (ref 30.0–36.0)
MCV: 88.9 fL (ref 80.0–100.0)
Monocytes Absolute: 0.4 10*3/uL (ref 0.1–1.0)
Monocytes Relative: 9 %
Neutro Abs: 2.3 10*3/uL (ref 1.7–7.7)
Neutrophils Relative %: 56 %
Platelet Count: 188 10*3/uL (ref 150–400)
RBC: 4.14 MIL/uL — ABNORMAL LOW (ref 4.22–5.81)
RDW: 17.1 % — ABNORMAL HIGH (ref 11.5–15.5)
WBC Count: 4.2 10*3/uL (ref 4.0–10.5)
nRBC: 0 % (ref 0.0–0.2)

## 2019-04-02 LAB — TSH: TSH: 2.587 u[IU]/mL (ref 0.320–4.118)

## 2019-04-02 MED ORDER — DIPHENHYDRAMINE HCL 50 MG/ML IJ SOLN
INTRAMUSCULAR | Status: AC
  Filled 2019-04-02: qty 1

## 2019-04-02 MED ORDER — SODIUM CHLORIDE 0.9 % IV SOLN
360.0000 mg | Freq: Once | INTRAVENOUS | Status: AC
  Administered 2019-04-02: 360 mg via INTRAVENOUS
  Filled 2019-04-02: qty 36

## 2019-04-02 MED ORDER — SODIUM CHLORIDE 0.9 % IV SOLN
1.0000 mg/kg | Freq: Once | INTRAVENOUS | Status: AC
  Administered 2019-04-02: 70 mg via INTRAVENOUS
  Filled 2019-04-02: qty 14

## 2019-04-02 MED ORDER — HEPARIN SOD (PORK) LOCK FLUSH 100 UNIT/ML IV SOLN
500.0000 [IU] | Freq: Once | INTRAVENOUS | Status: AC | PRN
  Administered 2019-04-02: 500 [IU]
  Filled 2019-04-02: qty 5

## 2019-04-02 MED ORDER — DIPHENHYDRAMINE HCL 50 MG/ML IJ SOLN
25.0000 mg | Freq: Once | INTRAMUSCULAR | Status: AC
  Administered 2019-04-02: 25 mg via INTRAVENOUS

## 2019-04-02 MED ORDER — FAMOTIDINE IN NACL 20-0.9 MG/50ML-% IV SOLN
INTRAVENOUS | Status: AC
  Filled 2019-04-02: qty 50

## 2019-04-02 MED ORDER — SODIUM CHLORIDE 0.9 % IV SOLN
Freq: Once | INTRAVENOUS | Status: AC
  Administered 2019-04-02: 11:00:00 via INTRAVENOUS
  Filled 2019-04-02: qty 250

## 2019-04-02 MED ORDER — FAMOTIDINE IN NACL 20-0.9 MG/50ML-% IV SOLN
20.0000 mg | Freq: Once | INTRAVENOUS | Status: AC
  Administered 2019-04-02: 20 mg via INTRAVENOUS

## 2019-04-02 MED ORDER — SODIUM CHLORIDE 0.9% FLUSH
10.0000 mL | INTRAVENOUS | Status: DC | PRN
  Administered 2019-04-02: 10 mL
  Filled 2019-04-02: qty 10

## 2019-04-02 NOTE — Patient Instructions (Signed)
[facility] Discharge Instructions for Patients Receiving Chemotherapy  Today you received the following chemotherapy agents Yervoy and Nivolumab  To help prevent nausea and vomiting after your treatment, we encourage you to take your nausea medication as directed   If you develop nausea and vomiting that is not controlled by your nausea medication, call the clinic.   BELOW ARE SYMPTOMS THAT SHOULD BE REPORTED IMMEDIATELY:  *FEVER GREATER THAN 100.5 F  *CHILLS WITH OR WITHOUT FEVER  NAUSEA AND VOMITING THAT IS NOT CONTROLLED WITH YOUR NAUSEA MEDICATION  *UNUSUAL SHORTNESS OF BREATH  *UNUSUAL BRUISING OR BLEEDING  TENDERNESS IN MOUTH AND THROAT WITH OR WITHOUT PRESENCE OF ULCERS  *URINARY PROBLEMS  *BOWEL PROBLEMS  UNUSUAL RASH Items with * indicate a potential emergency and should be followed up as soon as possible.  Feel free to call the clinic should you have any questions or concerns. The clinic phone number is [phone].  Please show the [facility] at check-in to the Emergency Department and triage nurse.

## 2019-04-02 NOTE — Patient Instructions (Signed)

## 2019-04-02 NOTE — Progress Notes (Signed)
Per Dr. Earlie Server OK to treat w/ pulse 102

## 2019-04-02 NOTE — Progress Notes (Signed)
[facility] [phone]   [phone]  OFFICE PROGRESS NOTE  [name], FNP [address] [place] [address]  DIAGNOSIS: Recurrent non-small cell lung cancer initially diagnosed as stage IIIA (T2a, N2, M0) non-small cell lung cancer consistent with invasive squamous cell carcinoma presented with right upper lobe lung mass in addition to right hilar and mediastinal lymphadenopathy diagnosed in September 2016.  He has disease recurrence in August 2020.  PRIOR THERAPY:  1) Course of concurrent chemoradiation with weekly carboplatin for AUC of 2 and paclitaxel 45 MG/M2. First dose 03/01/2015. He is status post 6 cycles. Last cycle was given 04/12/2015 with partial response. 2) Consolidation chemotherapy with reduced dose carboplatin for AUC of 5 and paclitaxel 175 MG/M2 every 3 weeks, status post 3 cycles.  CURRENT THERAPY: Systemic chemotherapy with carboplatin for AUC of 5, paclitaxel 175 mg/M2 with nivolumab 360 mg IV every 3 weeks in addition to ipilimumab 1 mg/KG every 6 weeks.  First dose 01/08/2019.  Status post 2 cycles.  Starting from cycle #3 the patient will be on maintenance treatment with ipilimumab every 6 weeks and nivolumab every 3 weeks.  INTERVAL HISTORY: Ivan Daniels 73 y.o. male returns to the clinic today for follow-up visit.  The patient is feeling fine today with no concerning complaints.  He tolerated his previous chemotherapy and immunotherapy fairly well.  He denied having any current chest pain, shortness of breath, cough or hemoptysis.  He denied having any fever or chills.  He has no nausea, vomiting, diarrhea or constipation.  He has no headache or visual changes.  The patient is here today to start cycle #3.   MEDICAL HISTORY: Past Medical History:  Diagnosis Date  . ALCOHOL ABUSE 11/12/2009   Qualifier: Diagnosis of  By: Hassell Done FNP, Tori Milks    . Allergy   . Anemia   . Anxiety   . BENIGN PROSTATIC  HYPERTROPHY, HX OF 12/19/2006   Qualifier: Diagnosis of  By: Radene Ou MD, [place]    . Blood transfusion without reported diagnosis 2017  . Clotting disorder ([place])    takes PLAVIX  . Depression   . Emphysema of lung ([place])   . Full code status 02/10/2015  . GERD (gastroesophageal reflux disease)   . Hyperlipidemia   . Lung mass 01/25/2015   3.7 x 3.6 cm RUL spiculated lung mass with 2.0 cm right paratracheal lymph node suspicious for bronchogenic CA  . Non-small cell lung cancer ([place]) 01/27/15   non small-cell,squamous cell ca RUL  . Paranoid schizophrenia ([place]) 10/31/2009   Qualifier: Diagnosis of  By: Jorene Minors, [place]    . Primary spontaneous pneumothorax, left 01/25/2015  . TOBACCO ABUSE 05/24/2009   Qualifier: Diagnosis of  By: Hassell Done FNP, [place]      ALLERGIES:  is allergic to benztropine mesylate and chlorpromazine hcl.  MEDICATIONS:  Current Outpatient Medications  Medication Sig Dispense Refill  . ALPHAGAN P 0.1 % SOLN Place 1 drop into both eyes 3 (three) times daily.  6  . aspirin EC 81 MG tablet Take 81 mg by mouth 2 (two) times daily.    . benztropine (COGENTIN) 0.5 MG tablet Take 0.5 mg by mouth at bedtime.     . chlorhexidine (PERIDEX) 0.12 % solution 1 mL by Mouth Rinse route as needed.   1  . clopidogrel (PLAVIX) 75 MG tablet Take 75 mg by mouth daily.     . ferrous fumarate (HEMOCYTE - 106 MG FE)  325 (106 FE) MG TABS tablet Take 1 tablet by mouth daily.     . fluconazole (DIFLUCAN) 100 MG tablet Take 1 tablet (100 mg total) by mouth daily. 7 tablet 0  . haloperidol (HALDOL) 5 MG tablet Take 5 mg by mouth at bedtime.     . Multiple Vitamins-Minerals (MULTIVITAMIN WITH MINERALS) tablet Take 1 tablet by mouth daily.    . NONFORMULARY OR COMPOUNDED [place]  Combination Pain Cream -  Baclofen 2%, Doxepin 5%, Gabapentin 6%, Topiramate 2%, Pentoxifylline 3% Apply 1-2 grams to affected area 3-4 times daily Qty. 120 gm 3 refills 1 each 3  . ondansetron  (ZOFRAN) 8 MG tablet Take 1 tablet (8 mg total) by mouth every 8 (eight) hours as needed for nausea or vomiting. 20 tablet 0  . simvastatin (ZOCOR) 20 MG tablet Take 20 mg by mouth at bedtime.     Marland Kitchen umeclidinium-vilanterol (ANORO ELLIPTA) 62.5-25 MCG/INH AEPB Inhale 1 puff into the lungs daily. 1 each 11   Current Facility-Administered Medications  Medication Dose Route Frequency Provider Last Rate Last Dose  . 0.9 %  sodium chloride infusion  500 mL Intravenous Continuous Irene Shipper, MD      . 0.9 %  sodium chloride infusion  500 mL Intravenous Continuous Irene Shipper, MD      . 0.9 %  sodium chloride infusion  500 mL Intravenous Continuous Irene Shipper, MD       Facility-Administered Medications Ordered in Other Visits  Medication Dose Route Frequency Provider Last Rate Last Dose  . sodium chloride flush (NS) 0.9 % injection 10 mL  10 mL Intracatheter PRN Wyatt Portela, MD   10 mL at 04/02/19 1005    SURGICAL HISTORY:  Past Surgical History:  Procedure Laterality Date  . APPENDECTOMY    . CHEST TUBE INSERTION Left   . COLONOSCOPY    . INSERTION CENTRAL VENOUS ACCESS DEVICE W/ SUBCUTANEOUS PORT Right   . LUNG BIOPSY Right 01/27/2015   Procedure: Right Upper Lobe Bronchus BIOPSY;  Surgeon: Grace Isaac, MD;  Location: [facility];  Service: Thoracic;  Laterality: Right;  Marland Kitchen VIDEO BRONCHOSCOPY WITH ENDOBRONCHIAL ULTRASOUND N/A 01/27/2015   Procedure: VIDEO BRONCHOSCOPY WITH ENDOBRONCHIAL ULTRASOUND;  Surgeon: Grace Isaac, MD;  Location: [facility] OR;  Service: Thoracic;  Laterality: N/A;    REVIEW OF SYSTEMS:  A comprehensive review of systems was negative.   PHYSICAL EXAMINATION: General appearance: alert, cooperative and no distress Head: Normocephalic, without obvious abnormality, atraumatic Neck: no adenopathy, no JVD, supple, symmetrical, trachea midline and thyroid not enlarged, symmetric, no tenderness/mass/nodules Lymph nodes: Cervical, supraclavicular, and axillary nodes  normal. Resp: clear to auscultation bilaterally Back: symmetric, no curvature. ROM normal. No CVA tenderness. Cardio: regular rate and rhythm, S1, S2 normal, no murmur, click, rub or gallop GI: soft, non-tender; bowel sounds normal; no masses,  no organomegaly Extremities: extremities normal, atraumatic, no cyanosis or edema  ECOG PERFORMANCE STATUS: 1 - Symptomatic but completely ambulatory  There were no vitals taken for this visit.  LABORATORY DATA: Lab Results  Component Value Date   WBC 5.1 03/12/2019   HGB 11.6 (L) 03/12/2019   HCT 35.9 (L) 03/12/2019   MCV 88.9 03/12/2019   PLT 238 03/12/2019      Chemistry      Component Value Date/Time   NA 140 03/12/2019 1045   NA 140 02/08/2017 0822   K 3.9 03/12/2019 1045   K 4.5 02/08/2017 0822   CL 103  03/12/2019 1045   CO2 25 03/12/2019 1045   CO2 25 02/08/2017 0822   BUN 11 03/12/2019 1045   BUN 8.8 02/08/2017 0822   CREATININE 1.02 03/12/2019 1045   CREATININE 1.1 02/08/2017 0822      Component Value Date/Time   CALCIUM 9.6 03/12/2019 1045   CALCIUM 9.6 02/08/2017 0822   ALKPHOS 55 03/12/2019 1045   ALKPHOS 53 02/08/2017 0822   AST 19 03/12/2019 1045   AST 19 02/08/2017 0822   ALT 10 03/12/2019 1045   ALT 9 02/08/2017 0822   BILITOT 0.2 (L) 03/12/2019 1045   BILITOT <0.22 02/08/2017 5361       RADIOGRAPHIC STUDIES: Ct Chest W Contrast  Result Date: 03/10/2019 CLINICAL DATA:  Restaging lung cancer EXAM: CT CHEST WITH CONTRAST TECHNIQUE: Multidetector CT imaging of the chest was performed during intravenous contrast administration. CONTRAST:  37mL OMNIPAQUE IOHEXOL 300 MG/ML  SOLN COMPARISON:  PET-CT, 12/25/2018, CT chest, 12/16/2018, 06/17/2018, 12/18/2017 FINDINGS: Cardiovascular: Right chest port catheter. Aortic atherosclerosis. Normal heart size. Three-vessel coronary artery calcifications. No pericardial effusion. Mediastinum/Nodes: Unchanged soft tissue thickening about the right hilum and mainstem bronchus  (series 2, image 47). No discretely enlarged mediastinal, hilar, or axillary lymph nodes. Thyroid gland, trachea, and esophagus demonstrate no significant findings. Lungs/Pleura: FDG avid soft tissue identified anteriorly on prior PET-CT is decreased in size and rim enhancement, now measuring approximately 4.1 x 2.7 x 3.6 cm, previously 4.2 x 2.7 x 4.9 cm when measured similarly (series 2, image 46, series 5, image 54). There is redemonstrated surrounding dense post treatment consolidation/fibrosis the right upper lobe and a small, loculated component of pleural fluid posteriorly. There are new irregular, adjacent spiculated nodules of the lateral right lower lobe, the largest measuring 1.4 x 1.3 cm (series 7, image 101). Near-total resolution of a previously seen left upper lobe pulmonary nodule, now subsolid measuring 3 mm (series 7, image 75). Paraseptal emphysema. Upper Abdomen: No acute abnormality. Low-attenuation liver lesions, unchanged compared to prior examinations, likely simple cysts. Musculoskeletal: No chest wall mass or suspicious bone lesions identified. Relative osteopenia of T5-T8, likely related to radiation port. IMPRESSION: 1. FDG avid soft tissue identified anteriorly on prior PET-CT is decreased in size and rim enhancement, now measuring approximately 4.1 x 2.7 x 3.6 cm, previously 4.2 x 2.7 x 4.9 cm when measured similarly (series 2, image 46, series 5, image 54). There is redemonstrated surrounding dense post treatment consolidation/fibrosis the right upper lobe and a small, loculated component of pleural fluid posteriorly. Findings are consistent with treatment response of recurrent malignancy. 2. Near-total resolution of a previously seen left upper lobe pulmonary nodule, now subsolid measuring 3 mm (series 7, image 75). Findings are consistent response of a metastatic nodule or alternately resolution of incidental infection or inflammation. 3. There are new irregular, adjacent spiculated  nodules of the lateral right lower lobe, the largest measuring 1.4 x 1.3 cm (series 7, image 101). Appearance and distribution are generally most consistent with infection or inflammation, although metastatic disease is not strictly excluded. Attention on follow-up. 4.  Emphysema (ICD10-J43.9). 5.  Coronary artery disease.  Aortic Atherosclerosis (ICD10-I70.0). Electronically Signed   By: Eddie Candle M.D.   On: 03/10/2019 15:42    ASSESSMENT AND PLAN:  This is a very pleasant 73 years old African-American male with recurrent non-small cell lung cancer initially diagnosed as stage IIIA non-small cell lung cancer, squamous cell carcinoma status post concurrent chemoradiation followed by consolidation chemotherapy. The patient has been in observation for  close to 3 years. He had evidence for disease recurrence on the recent imaging studies including CT scan of the chest as well as PET scan. The patient is started  treatment with systemic chemotherapy with carboplatin for AUC of 5, paclitaxel 175 mg/M2 as well as combination of immunotherapy with nivolumab 360 mg IV every 3 weeks and ipilimumab 1 mg/KG every 6 weeks.  The chemotherapy will be given only for 2 cycles and then the patient will be maintained on immunotherapy with nivolumab and ipilimumab for a total of 2 years if he has no evidence for disease progression or unacceptable toxicity.  He is status post 2 cycles of treatment with systemic chemotherapy as well as immunotherapy with ipilimumab and nivolumab. He tolerated the first 2 cycles of his treatment well. Starting from cycle #3 the patient will be on maintenance treatment with ipilimumab 1 mg/KG every 6 weeks in addition to nivolumab 360 MG IV every 3 weeks. I recommended for the patient to proceed with his treatment today as planned. I will see him back for follow-up visit in 3 weeks for evaluation before the next dose of his treatment. The patient was advised to call immediately if he has  any concerning symptoms in the interval.  Disclaimer: This note was dictated with voice recognition software. Similar sounding words can inadvertently be transcribed and may not be corrected upon review.

## 2019-04-04 ENCOUNTER — Telehealth: Payer: Self-pay | Admitting: Internal Medicine

## 2019-04-04 NOTE — Telephone Encounter (Signed)
Scheduled per los. Called and left msg. Mailed printout  °

## 2019-04-08 ENCOUNTER — Other Ambulatory Visit (HOSPITAL_COMMUNITY)
Admission: RE | Admit: 2019-04-08 | Discharge: 2019-04-08 | Disposition: A | Discharge status: Home / Self Care | Payer: Medicare Other | Source: Ambulatory Visit | Attending: Internal Medicine | Admitting: Internal Medicine

## 2019-04-08 ENCOUNTER — Ambulatory Visit: Payer: Medicare Other | Admitting: Internal Medicine

## 2019-04-08 DIAGNOSIS — Z20828 Contact with and (suspected) exposure to other viral communicable diseases: Secondary | ICD-10-CM | POA: Insufficient documentation

## 2019-04-08 DIAGNOSIS — Z01812 Encounter for preprocedural laboratory examination: Secondary | ICD-10-CM | POA: Diagnosis not present

## 2019-04-09 ENCOUNTER — Other Ambulatory Visit: Payer: Medicare Other

## 2019-04-10 ENCOUNTER — Other Ambulatory Visit: Payer: Self-pay | Admitting: Internal Medicine

## 2019-04-10 DIAGNOSIS — J449 Chronic obstructive pulmonary disease, unspecified: Secondary | ICD-10-CM

## 2019-04-10 LAB — NOVEL CORONAVIRUS, NAA (HOSP ORDER, SEND-OUT TO REF LAB; TAT 18-24 HRS): SARS-CoV-2, NAA: NOT DETECTED

## 2019-04-11 ENCOUNTER — Encounter: Payer: Self-pay | Admitting: Internal Medicine

## 2019-04-11 ENCOUNTER — Other Ambulatory Visit: Payer: Self-pay

## 2019-04-11 ENCOUNTER — Ambulatory Visit: Payer: Medicare Other | Admitting: *Deleted

## 2019-04-11 ENCOUNTER — Ambulatory Visit (INDEPENDENT_AMBULATORY_CARE_PROVIDER_SITE_OTHER): Payer: Medicare Other | Admitting: Internal Medicine

## 2019-04-11 DIAGNOSIS — J449 Chronic obstructive pulmonary disease, unspecified: Secondary | ICD-10-CM

## 2019-04-11 LAB — PULMONARY FUNCTION TEST
DL/VA % pred: 62 %
DL/VA: 2.51 ml/min/mmHg/L
DLCO unc % pred: 39 %
DLCO unc: 10.49 ml/min/mmHg
FEF 25-75 Post: 1.24 L/sec
FEF 25-75 Pre: 1.42 L/sec
FEF2575-%Change-Post: -12 %
FEF2575-%Pred-Post: 49 %
FEF2575-%Pred-Pre: 56 %
FEV1-%Change-Post: -2 %
FEV1-%Pred-Post: 60 %
FEV1-%Pred-Pre: 62 %
FEV1-Post: 1.79 L
FEV1-Pre: 1.85 L
FEV1FVC-%Change-Post: -1 %
FEV1FVC-%Pred-Pre: 94 %
FEV6-%Change-Post: -1 %
FEV6-%Pred-Post: 67 %
FEV6-%Pred-Pre: 67 %
FEV6-Post: 2.53 L
FEV6-Pre: 2.56 L
FEV6FVC-%Pred-Post: 104 %
FEV6FVC-%Pred-Pre: 104 %
FVC-%Change-Post: -1 %
FVC-%Pred-Post: 64 %
FVC-%Pred-Pre: 64 %
FVC-Post: 2.53 L
FVC-Pre: 2.57 L
Post FEV1/FVC ratio: 71 %
Post FEV6/FVC ratio: 100 %
Pre FEV1/FVC ratio: 72 %
Pre FEV6/FVC Ratio: 100 %
RV % pred: 84 %
RV: 2.15 L
TLC % pred: 65 %
TLC: 4.75 L

## 2019-04-11 MED ORDER — ANORO ELLIPTA 62.5-25 MCG/INH IN AEPB: 1.0000 | INHALATION_SPRAY | Freq: Every day | RESPIRATORY_TRACT | 11 refills | Status: AC

## 2019-04-11 NOTE — Progress Notes (Signed)
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  PRIOR THERAPY:  1) Course of concurrent chemoradiation with weekly carboplatin for AUC of 2 and paclitaxel 45 MG/M2. First dose 03/01/2015. He is status post 6 cycles. Last cycle was given 04/12/2015 with partial response. 2) Consolidation chemotherapy with reduced dose carboplatin for AUC of 5 and paclitaxel 175 MG/M2 every 3 weeks, status post 3 cycles.  CURRENT THERAPY: Observation   History of Present Illness 04/01/2018  Transition of care/   GOLD II/ anoro maint rx  Chief Complaint  Patient presents with  . Follow-up    Former patient of Dr Ashok Cordia. He states breathing is "alright".  He is using anoro and does not have a rescue inhaler. Spouse reports he coughs in the night.   Dyspnea:  [employer] slow pace/ pushing buggy/ [facility] parking/ landing s stopping MMRC3 = can't walk 100 yards even at a slow pace at a flat grade s stopping due to sob   Cough: dry / cough does not bother him or wake him at night  Sleeping: 1-2 pillows, bed flat  SABA use: none 02: none  rec F/u in 6 m     10/07/2018  f/u ov/ re: GOLD II / Group B  On anoro maint / no aecopds Chief Complaint  Patient presents with  . Follow-up    Breathing is doing well and no new co's.   Dyspnea:  [employer] ok once a week o/w very sedentary / gets let off at door and uses hc parking = MMRC3 = can't walk 100 yards even at a slow pace at a flat grade s stopping due to sob   Cough: sporadic, not in am  /non-productive Sleeping: ok flat  rec Continue to use anoro one click each am    16/0/1093  f/u ov/ re: GOLD II  / anoro  Chief Complaint  Patient presents with  . Follow-up    PFT done today. Breathing is unchanged since the last visit.   Dyspnea:  MMRC3 = can't walk 100 yards even at a slow pace at a flat grade s stopping due to sob   Cough: no  Sleeping: flat  SABA use: none  02: none    No obvious day to day or daytime variability or assoc excess/ purulent sputum or mucus plugs or hemoptysis or cp or chest tightness, subjective wheeze or overt sinus or hb symptoms.   Sleeping as above  without nocturnal  or early am exacerbation  of respiratory  c/o's or need for noct saba. Also denies any obvious fluctuation of symptoms with weather or environmental changes or other aggravating or alleviating factors except as outlined above   No unusual exposure hx or h/o childhood pna/ asthma or knowledge of premature birth.  Current Allergies, Complete Past Medical History, Past Surgical History, Family History, and Social History were reviewed in [employer] record.  ROS  The following are not active complaints unless bolded Hoarseness, sore throat, dysphagia,  dental problems, itching, sneezing,  nasal congestion or discharge of excess mucus or purulent secretions, ear ache,   fever, chills, sweats, unintended wt loss or wt gain, classically pleuritic or exertional cp,  orthopnea pnd or arm/hand swelling  or leg swelling, presyncope, palpitations, abdominal pain, anorexia, nausea, vomiting, diarrhea  or change in bowel habits or change in bladder habits, change in stools or change in urine, dysuria, hematuria,  rash, arthralgias, visual complaints, headache, numbness, weakness or ataxia or problems with walking or coordination,  change in mood or  memory.        Current Meds  Medication Sig  . ALPHAGAN P 0.1 % SOLN Place 1 drop into both eyes 3 (three) times daily.   Marland Kitchen aspirin EC 81 MG tablet Take 81 mg by mouth 2 (two) times daily.  . benztropine (COGENTIN) 0.5 MG tablet Take 0.5 mg by mouth at bedtime.   . chlorhexidine (PERIDEX) 0.12 % solution 1 mL by Mouth Rinse route as needed.   . clopidogrel (PLAVIX) 75 MG tablet Take 75 mg by mouth daily.   . ferrous fumarate (HEMOCYTE - 106 MG FE) 325 (106 FE) MG TABS tablet Take 1 tablet by mouth daily.   . fluconazole (DIFLUCAN) 100 MG tablet Take 1 tablet (100 mg total) by mouth daily.  . haloperidol (HALDOL) 5 MG tablet Take 5 mg by mouth at bedtime.   . Multiple Vitamins-Minerals (MULTIVITAMIN WITH MINERALS) tablet Take 1 tablet by mouth daily.  . NONFORMULARY OR COMPOUNDED [place]  Combination Pain Cream -  Baclofen 2%, Doxepin 5%, Gabapentin 6%, Topiramate 2%, Pentoxifylline 3% Apply 1-2 grams to affected area 3-4 times daily Qty. 120 gm 3 refills  . ondansetron (ZOFRAN) 8 MG tablet Take 1 tablet (8 mg total) by mouth every 8 (eight) hours as needed for nausea or vomiting.  . simvastatin (ZOCOR) 20 MG tablet Take 20 mg by mouth at bedtime.   Marland Kitchen umeclidinium-vilanterol (ANORO ELLIPTA) 62.5-25 MCG/INH AEPB Inhale 1 puff into the lungs daily.                        Objective:   Physical Exam.     04/11/2019         146  10/07/2018          152   04/01/18 151 lb 12.8 oz (68.9 kg)  12/19/17 148 lb 6.4 oz (67.3 kg)  12/01/17 152 lb (68.9 kg)      HEENT: Edentulous     HEENT : pt wearing mask not removed for exam due to covid - 19 concerns.    NECK :  without JVD/Nodes/TM/ nl carotid upstrokes bilaterally   LUNGS: no acc muscle use,  Mild barrel  contour chest wall with bilateral  Distant bs s audible wheeze and  without cough on insp or exp maneuvers  and mild  Hyperresonant  to  percussion bilaterally     CV:  RRR  no s3 or murmur or increase in P2, and no edema   ABD:  soft and nontender with pos end  insp Hoover's  in the supine position. No bruits or organomegaly  appreciated, bowel sounds nl  MS:   Nl gait/  ext warm without deformities, calf tenderness, cyanosis or clubbing No obvious joint restrictions   SKIN: warm and dry without lesions    NEURO:  alert, approp, nl sensorium with  no motor or cerebellar deficits apparent.  PFT 06/13/16: FVC 2.74 L (68%) FEV1 1.75 (57%) FEV1/FVC is 0.64 FEF 25-75 1.00) 3%) negative bronchodilator response TLC 4.44 L (61%) RV 76% ERV 122% DLCO corrected 38%  6MWT 06/15/16:  Walked 222 meters / Baseline Sat 98% on RA / Nadir Sat 97% on RA 2 minutes after walk  IMAGING CT CHEST W/ CONTRAST 02/08/17 (personally reviewed by me):  Right perihilar and upper lobe opacity with consolidation consistent with postradiation fibrosis. This is not significantly changed with comparison to prior CT scans. No new nodule or opacity appreciated. No pleural effusion or thickening. No pericardial effusion. No pathologic mediastinal adenopathy. Volume loss on the right appears chronic with fibrotic changes.   ESOPHAGRAM/BARIUM SWALLOW 05/19/16  :  Smoothly tapered stricture of the proximal esophagus at the level of the thoracic inlet preventing passage of barium tablet. Moderate esophageal dysmotility.  CT CHEST W/ CONTRAST 02/28/16 Atypical predominant emphysema & subpleural bleb formation. Right hilar consolidation suggesting fibrotic change with continued stability. Hyperexpansion of left lung volume loss on the right. No new nodule or opacity appreciated. No pathologic mediastinal adenopathy. No pleural effusion or thickening. No pericardial effusion.  PATHOLOGY Right Upper Lobe Bronchial Brush (01/27/15):  Squamous Cell Carcinoma Right Upper Lobe Endobronchial Biopsy (01/27/15):  Squamous Cell Carcinoma EBUS Level 7 FNA (01/27/15):  Squamous Cell Carcinoma        Assessment & Plan:

## 2019-04-11 NOTE — Patient Instructions (Signed)
Stay active and pace yourself   If you are satisfied with your treatment plan,  let your doctor know and he/she can either refill your medications or you can return here when your prescription runs out.     If in any way you are not 100% satisfied,  please tell us.  If 100% better, tell your friends!  Pulmonary follow up is as needed

## 2019-04-13 ENCOUNTER — Encounter: Payer: Self-pay | Admitting: Internal Medicine

## 2019-04-13 NOTE — Assessment & Plan Note (Addendum)
Quit smoking 01/2016 PFT  06/13/16:   FEV1 1.75 (57%) FEV1/FVC is 0.64 F  negative bronchodilator response ? On prior rx    DLCO corrected 38% - 04/01/2018  After extensive coaching inhaler device,  effectiveness =    90% with elipta from baseline of 50%  -  10/07/2018  After extensive coaching inhaler device,  effectiveness =    90%  -  10/07/2018   Walked RA  2 laps @ approx 266ft each @ slow pace  stopped due to end of study, no desats, no sob  - PFT's  04/11/2019  FEV1 1.85 (62 % ) ratio 0.72  p 0 % improvement from saba p 0 prior to study with DLCO  10.49 (39%) corrects to 2.51 (62%)  for alv volume and FV curve mild curvature    Pt is Group B in terms of symptom/risk and laba/lama therefore appropriate rx at this point >>>  anoro approp / ok to refill per PCP or here yearly  I had an extended final summary discussion with the patient reviewing all relevant studies completed to date and  lasting 15 to 20 minutes of a 25 minute visit on the following issues:   1)  pfts reviewed and fit with his reproducible doe s tendency to aecopd or much variability at all which he can use to his advantage by learning to pace himself  2) if worse or starts having flares > change to trelegy one click each am easy to do as same device (which he has mastered)  3) Advised:  formulary restrictions will be an ongoing challenge for the forseable future and I would be happy to pick an alternative if the pt will first  provide me a list of them -  pt  will need to return here for training for any new device that is required eg dpi vs hfa vs respimat.    In the meantime we can always provide samples so that the patient never runs out of any needed respiratory medications.   4) Pt informed of the seriousness of COVID 19 infection as a direct risk to their health  and safey and to those of their loved ones and should continue to wear facemask in public and minimize exposure to public locations but especially avoid any area or  activity where non-close contacts are not observing distancing or wearing an appropriate face mask.    >>> f/u her is prn if PCP willing to refill Anoro, or we can see him back yearly for this purpose.

## 2019-04-23 ENCOUNTER — Other Ambulatory Visit: Payer: Self-pay

## 2019-04-23 ENCOUNTER — Inpatient Hospital Stay: Payer: Medicare Other

## 2019-04-23 ENCOUNTER — Inpatient Hospital Stay: Payer: Medicare Other | Attending: Internal Medicine | Admitting: Physician Assistant

## 2019-04-23 ENCOUNTER — Encounter: Payer: Self-pay | Admitting: Physician Assistant

## 2019-04-23 VITALS — HR 103

## 2019-04-23 VITALS — BP 117/62 | HR 114 | Temp 98.0°F | Resp 19 | Ht 71.0 in | Wt 146.2 lb

## 2019-04-23 DIAGNOSIS — Z79899 Other long term (current) drug therapy: Secondary | ICD-10-CM | POA: Diagnosis not present

## 2019-04-23 DIAGNOSIS — C3411 Malignant neoplasm of upper lobe, right bronchus or lung: Secondary | ICD-10-CM | POA: Diagnosis present

## 2019-04-23 DIAGNOSIS — C3491 Malignant neoplasm of unspecified part of right bronchus or lung: Secondary | ICD-10-CM

## 2019-04-23 DIAGNOSIS — Z5112 Encounter for antineoplastic immunotherapy: Secondary | ICD-10-CM | POA: Diagnosis present

## 2019-04-23 DIAGNOSIS — R5382 Chronic fatigue, unspecified: Secondary | ICD-10-CM

## 2019-04-23 DIAGNOSIS — J449 Chronic obstructive pulmonary disease, unspecified: Secondary | ICD-10-CM

## 2019-04-23 LAB — CMP (CANCER CENTER ONLY)
ALT: 10 U/L (ref 0–44)
AST: 18 U/L (ref 15–41)
Albumin: 3.8 g/dL (ref 3.5–5.0)
Alkaline Phosphatase: 49 U/L (ref 38–126)
Anion gap: 10 (ref 5–15)
BUN: 13 mg/dL (ref 8–23)
CO2: 26 mmol/L (ref 22–32)
Calcium: 9.2 mg/dL (ref 8.9–10.3)
Chloride: 104 mmol/L (ref 98–111)
Creatinine: 1.06 mg/dL (ref 0.61–1.24)
GFR, Est AFR Am: >60 mL/min (ref >60)
GFR, Estimated: >60 mL/min (ref >60)
Glucose, Bld: 120 mg/dL — ABNORMAL HIGH (ref 70–99)
Potassium: 3.5 mmol/L (ref 3.5–5.1)
Sodium: 140 mmol/L (ref 135–145)
Total Bilirubin: <0.2 mg/dL — ABNORMAL LOW (ref 0.3–1.2)
Total Protein: 7.5 g/dL (ref 6.5–8.1)

## 2019-04-23 LAB — CBC WITH DIFFERENTIAL (CANCER CENTER ONLY)
Abs Immature Granulocytes: 0 10*3/uL (ref 0.00–0.07)
Basophils Absolute: 0 10*3/uL (ref 0.0–0.1)
Basophils Relative: 1 %
Eosinophils Absolute: 0.1 10*3/uL (ref 0.0–0.5)
Eosinophils Relative: 2 %
HCT: 37.7 % — ABNORMAL LOW (ref 39.0–52.0)
Hemoglobin: 12.2 g/dL — ABNORMAL LOW (ref 13.0–17.0)
Immature Granulocytes: 0 %
Lymphocytes Relative: 36 %
Lymphs Abs: 1.4 10*3/uL (ref 0.7–4.0)
MCH: 29 pg (ref 26.0–34.0)
MCHC: 32.4 g/dL (ref 30.0–36.0)
MCV: 89.8 fL (ref 80.0–100.0)
Monocytes Absolute: 0.3 10*3/uL (ref 0.1–1.0)
Monocytes Relative: 9 %
Neutro Abs: 2 10*3/uL (ref 1.7–7.7)
Neutrophils Relative %: 52 %
Platelet Count: 201 10*3/uL (ref 150–400)
RBC: 4.2 MIL/uL — ABNORMAL LOW (ref 4.22–5.81)
RDW: 15.9 % — ABNORMAL HIGH (ref 11.5–15.5)
WBC Count: 3.8 10*3/uL — ABNORMAL LOW (ref 4.0–10.5)
nRBC: 0 % (ref 0.0–0.2)

## 2019-04-23 LAB — TSH: TSH: 3.265 u[IU]/mL (ref 0.320–4.118)

## 2019-04-23 MED ORDER — HEPARIN SOD (PORK) LOCK FLUSH 100 UNIT/ML IV SOLN
500.0000 [IU] | Freq: Once | INTRAVENOUS | Status: AC | PRN
  Administered 2019-04-23: 500 [IU]
  Filled 2019-04-23: qty 5

## 2019-04-23 MED ORDER — SODIUM CHLORIDE 0.9% FLUSH
10.0000 mL | INTRAVENOUS | Status: DC | PRN
  Administered 2019-04-23: 10 mL
  Filled 2019-04-23: qty 10

## 2019-04-23 MED ORDER — SODIUM CHLORIDE 0.9 % IV SOLN
360.0000 mg | Freq: Once | INTRAVENOUS | Status: AC
  Administered 2019-04-23: 360 mg via INTRAVENOUS
  Filled 2019-04-23: qty 24

## 2019-04-23 MED ORDER — SODIUM CHLORIDE 0.9 % IV SOLN
Freq: Once | INTRAVENOUS | Status: AC
  Filled 2019-04-23: qty 250

## 2019-04-23 NOTE — Progress Notes (Signed)
[facility] OFFICE PROGRESS NOTE  [name], FNP 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  He has disease recurrence in August 2020.  PRIOR THERAPY: 1) Course of concurrent chemoradiation with weekly carboplatin for AUC of 2 and paclitaxel 45 MG/M2. First dose 03/01/2015. He is status post 6 cycles. Last cycle was given 04/12/2015 with partial response. 2) Consolidation chemotherapy with reduced dose carboplatin for AUC of 5 and paclitaxel 175 MG/M2 every 3 weeks, status post 3 cycles.  CURRENT THERAPY: Systemic chemotherapy with carboplatin for AUC of 5, paclitaxel 175 mg/M2 with nivolumab 360 mg IV every 3 weeks in addition to ipilimumab 1 mg/KG every 6 weeks.  First dose 01/08/2019.  Status post day 1 of 3 cycles.  Starting from cycle #3 the patient will be on maintenance treatment with ipilimumab every 6 weeks and nivolumab every 3 weeks  INTERVAL HISTORY: Ivan Daniels 73 y.o. male returns to the clinic for a follow up visit accompanied by his sister, Dorthy. The patient is feeling well today without any concerning complaints except he states he has had diarrhea following his last cycle of treatment. He states he has been having approximately 1-2 loose bowel movements daily since his last treatment. He has not needed to use any anti-diarrheal medications such as Imodium. He denies any fever, chills, night sweats, weight loss, abdominal pain, nausea, vomiting,  or blood in the stool. Otherwise, he denies any chest pain, shortness of breath, cough, or hemoptysis. Denies any headache or visual changes. Denies any rashes or skin changes. The patient is here today for evaluation  prior to starting cycle # day 22 of cycle 3.    MEDICAL HISTORY: Past Medical History:  Diagnosis Date  . ALCOHOL ABUSE 11/12/2009   Qualifier: Diagnosis of  By: Hassell Done FNP, Tori Milks    . Allergy   . Anemia   . Anxiety   . BENIGN PROSTATIC HYPERTROPHY, HX OF 12/19/2006   Qualifier: Diagnosis of  By: Radene Ou MD, [place]    . Blood transfusion without reported diagnosis 2017  . Clotting disorder ([place])    takes PLAVIX  . Depression   . Emphysema of lung ([place])   . Full code status 02/10/2015  . GERD (gastroesophageal reflux disease)   . Hyperlipidemia   . Lung mass 01/25/2015   3.7 x 3.6 cm RUL spiculated lung mass with 2.0 cm right paratracheal lymph node suspicious for bronchogenic CA  . Non-small cell lung cancer ([place]) 01/27/15   non small-cell,squamous cell ca RUL  . Paranoid schizophrenia ([place]) 10/31/2009   Qualifier: Diagnosis of  By: Jorene Minors, [place]    . Primary spontaneous pneumothorax, left 01/25/2015  . TOBACCO ABUSE 05/24/2009   Qualifier: Diagnosis of  By: Hassell Done FNP, Nykedtra      ALLERGIES:  is allergic to benztropine mesylate and chlorpromazine hcl.  MEDICATIONS:  Current Outpatient Medications  Medication Sig Dispense Refill  . ALPHAGAN P 0.1 % SOLN Place 1 drop into both eyes 3 (three) times daily.  6  . aspirin EC 81 MG tablet Take 81 mg by mouth 2 (two) times daily.    . benztropine (COGENTIN) 0.5 MG tablet Take 0.5 mg by mouth at bedtime.     . chlorhexidine ([place])  0.12 % solution 1 mL by Mouth Rinse route as needed.   1  . clopidogrel (PLAVIX) 75 MG tablet Take 75 mg by mouth daily.     . ferrous fumarate (HEMOCYTE - 106 MG FE) 325 (106 FE) MG TABS tablet Take 1 tablet by mouth daily.     . fluconazole (DIFLUCAN) 100 MG tablet Take 1 tablet (100 mg total) by mouth daily. 7 tablet 0  . haloperidol (HALDOL) 5 MG tablet Take 5 mg by mouth at bedtime.     . Multiple Vitamins-Minerals (MULTIVITAMIN WITH MINERALS) tablet Take 1 tablet by mouth daily.    .  NONFORMULARY OR COMPOUNDED [place]  Combination Pain Cream -  Baclofen 2%, Doxepin 5%, Gabapentin 6%, Topiramate 2%, Pentoxifylline 3% Apply 1-2 grams to affected area 3-4 times daily Qty. 120 gm 3 refills 1 each 3  . ondansetron (ZOFRAN) 8 MG tablet Take 1 tablet (8 mg total) by mouth every 8 (eight) hours as needed for nausea or vomiting. 20 tablet 0  . simvastatin (ZOCOR) 20 MG tablet Take 20 mg by mouth at bedtime.     Marland Kitchen umeclidinium-vilanterol (ANORO ELLIPTA) 62.5-25 MCG/INH AEPB Inhale 1 puff into the lungs daily. 1 each 11   Current Facility-Administered Medications  Medication Dose Route Frequency Provider Last Rate Last Admin  . 0.9 %  sodium chloride infusion  500 mL Intravenous Continuous Irene Shipper, MD      . 0.9 %  sodium chloride infusion  500 mL Intravenous Continuous Irene Shipper, MD      . 0.9 %  sodium chloride infusion  500 mL Intravenous Continuous Irene Shipper, MD       Facility-Administered Medications Ordered in Other Visits  Medication Dose Route Frequency Provider Last Rate Last Admin  . heparin lock flush 100 unit/mL  500 Units Intracatheter Once PRN Curt Bears, MD      . nivolumab (OPDIVO) 360 mg in sodium chloride 0.9 % 100 mL chemo infusion  360 mg Intravenous Once Curt Bears, MD      . sodium chloride flush (NS) 0.9 % injection 10 mL  10 mL Intracatheter PRN Curt Bears, MD        SURGICAL HISTORY:  Past Surgical History:  Procedure Laterality Date  . APPENDECTOMY    . CHEST TUBE INSERTION Left   . COLONOSCOPY    . INSERTION CENTRAL VENOUS ACCESS DEVICE W/ SUBCUTANEOUS PORT Right   . LUNG BIOPSY Right 01/27/2015   Procedure: Right Upper Lobe Bronchus BIOPSY;  Surgeon: Grace Isaac, MD;  Location: [facility];  Service: Thoracic;  Laterality: Right;  Marland Kitchen VIDEO BRONCHOSCOPY WITH ENDOBRONCHIAL ULTRASOUND N/A 01/27/2015   Procedure: VIDEO BRONCHOSCOPY WITH ENDOBRONCHIAL ULTRASOUND;  Surgeon: Grace Isaac, MD;  Location:  [facility];  Service: Thoracic;  Laterality: N/A;    REVIEW OF SYSTEMS:   Review of Systems  Constitutional: Positive for fatigue. Negative for appetite change, chills,  fever and unexpected weight change.  HENT: Negative for mouth sores, nosebleeds, sore throat and trouble swallowing.   Eyes: Negative for eye problems and icterus.  Respiratory: Negative for cough, hemoptysis, shortness of breath and wheezing.   Cardiovascular: Negative for chest pain and leg swelling.  Gastrointestinal: Positive for occasional diarrhea. Negative for abdominal pain, constipation, nausea and vomiting.  Genitourinary: Negative for bladder incontinence, difficulty urinating, dysuria, frequency and hematuria.   Musculoskeletal: Negative for back pain, gait problem, neck pain and neck stiffness.  Skin: Negative for itching and rash.  Neurological: Negative for dizziness, extremity weakness, gait problem, headaches, light-headedness and seizures.  Hematological: Negative for adenopathy. Does not bruise/bleed easily.  Psychiatric/Behavioral: Negative for confusion, depression and sleep disturbance. The patient is not nervous/anxious.     PHYSICAL EXAMINATION:  Blood pressure 117/62, pulse (!) 114, temperature 98 F (36.7 C), temperature source Temporal, resp. rate 19, height 5\' 11"  (1.803 m), weight 146 lb 3.2 oz (66.3 kg), SpO2 100 %.  ECOG PERFORMANCE STATUS: 1 - Symptomatic but completely ambulatory  Physical Exam  Constitutional: Oriented to person, place, and time and thin-appearing male and in no distress. Marland Kitchen  HENT:  Head: Normocephalic and atraumatic.  Mouth/Throat: Oropharynx is clear and moist. No oropharyngeal exudate.  Eyes: Conjunctivae are normal. Right eye exhibits no discharge. Left eye exhibits no discharge. No scleral icterus.  Neck: Normal range of motion. Neck supple.  Cardiovascular: Normal rate, regular rhythm, normal heart sounds and intact distal pulses.   Pulmonary/Chest: Effort normal and  breath sounds normal. No respiratory distress. No wheezes. No rales.  Abdominal: Soft. Bowel sounds are normal. Exhibits no distension and no mass. There is no tenderness.  Musculoskeletal: Normal range of motion. Exhibits no edema.  Lymphadenopathy:    No cervical adenopathy.  Neurological: Alert and oriented to person, place, and time. Exhibits normal muscle tone. Gait normal. Coordination normal.  Skin: Skin is warm and dry. No rash noted. Not diaphoretic. No erythema. No pallor.  Psychiatric: Mood, memory and judgment normal.  Vitals reviewed.  LABORATORY DATA: Lab Results  Component Value Date   WBC 3.8 (L) 04/23/2019   HGB 12.2 (L) 04/23/2019   HCT 37.7 (L) 04/23/2019   MCV 89.8 04/23/2019   PLT 201 04/23/2019      Chemistry      Component Value Date/Time   NA 140 04/23/2019 0950   NA 140 02/08/2017 0822   K 3.5 04/23/2019 0950   K 4.5 02/08/2017 0822   CL 104 04/23/2019 0950   CO2 26 04/23/2019 0950   CO2 25 02/08/2017 0822   BUN 13 04/23/2019 0950   BUN 8.8 02/08/2017 0822   CREATININE 1.06 04/23/2019 0950   CREATININE 1.1 02/08/2017 0822      Component Value Date/Time   CALCIUM 9.2 04/23/2019 0950   CALCIUM 9.6 02/08/2017 0822   ALKPHOS 49 04/23/2019 0950   ALKPHOS 53 02/08/2017 0822   AST 18 04/23/2019 0950   AST 19 02/08/2017 0822   ALT 10 04/23/2019 0950   ALT 9 02/08/2017 0822   BILITOT <0.2 (L) 04/23/2019 0950   BILITOT <0.22 02/08/2017 1308       RADIOGRAPHIC STUDIES:  No results found.   ASSESSMENT/PLAN:  This isa very pleasant 73 year old African-American male initially diagnosed with stage IIIa non-small cell lung cancer, squamous cell carcinoma. He was diagnosed in April 2016.He presented with a right upper lobe lung mass and right hilar and mediastinal lymphadenopathy.   He isstatuspost concurrent chemoradiation followed by consolidation chemotherapy.He has beenon observation for over 3years.  The patient showed evidence of  disease recurrence in August 2020  He is currently undergoing treatment with carboplatin for an AUC of 5, paclitaxel 175 mg/m in combination with immunotherapy with nivolumab 360 mg IV every 3 weeks and ipilimumab 1 mg/kg IV every 6 weeks.  The patient is status post cycle #2.  Starting from cycle #2, patient will be on maintenance treatment with immunotherapy with nivolumab 360 mg IV every 3 weeks and ipilimumab 1 mg/kg IV every 6 weeks for 2 years as  long as the patient has no evidence of disease progression or unacceptable toxicity.  Labs were reviewed. Recommend that he proceed with day 22 of cycle 3 today.   We will see him back for a follow up visit in 3 weeks for evaluation before starting cycle #4.   For his diarrhea, he was instructed to call us if he develops significant diarrhea ~5-6+ loose stools daily not controlled by imodium.   The patient was advised to call immediately if he has any concerning symptoms in the interval. The patient voices understanding of current disease status and treatment options and is in agreement with the current care plan. All questions were answered. The patient knows to call the clinic with any problems, questions or concerns. We can certainly see the patient much sooner if necessary   No orders of the defined types were placed in this encounter.    Tema Alire L Latesia Norrington, PA-C 04/23/19

## 2019-04-23 NOTE — Progress Notes (Signed)
Okay to treat today with HR 103, per Cassie Heilingoepter, PA.

## 2019-04-23 NOTE — Patient Instructions (Signed)
[facility] Discharge Instructions for Patients Receiving Chemotherapy  Today you received the following chemotherapy agents Opdivo  To help prevent nausea and vomiting after your treatment, we encourage you to take your nausea medication as directed   If you develop nausea and vomiting that is not controlled by your nausea medication, call the clinic.   BELOW ARE SYMPTOMS THAT SHOULD BE REPORTED IMMEDIATELY:  *FEVER GREATER THAN 100.5 F  *CHILLS WITH OR WITHOUT FEVER  NAUSEA AND VOMITING THAT IS NOT CONTROLLED WITH YOUR NAUSEA MEDICATION  *UNUSUAL SHORTNESS OF BREATH  *UNUSUAL BRUISING OR BLEEDING  TENDERNESS IN MOUTH AND THROAT WITH OR WITHOUT PRESENCE OF ULCERS  *URINARY PROBLEMS  *BOWEL PROBLEMS  UNUSUAL RASH Items with * indicate a potential emergency and should be followed up as soon as possible.  Feel free to call the clinic should you have any questions or concerns. The clinic phone number is [phone].  Please show the CHEMO ALERT CARD at check-in to the Emergency Department and triage nurse.   

## 2019-05-14 ENCOUNTER — Inpatient Hospital Stay: Payer: Medicare Other

## 2019-05-14 ENCOUNTER — Other Ambulatory Visit: Payer: Self-pay

## 2019-05-14 ENCOUNTER — Encounter: Payer: Self-pay | Admitting: Internal Medicine

## 2019-05-14 ENCOUNTER — Inpatient Hospital Stay: Payer: Medicare Other | Attending: Internal Medicine | Admitting: Internal Medicine

## 2019-05-14 VITALS — BP 122/76 | HR 105 | Temp 98.5°F | Resp 18 | Ht 71.0 in | Wt 150.3 lb

## 2019-05-14 VITALS — HR 103

## 2019-05-14 DIAGNOSIS — J449 Chronic obstructive pulmonary disease, unspecified: Secondary | ICD-10-CM

## 2019-05-14 DIAGNOSIS — C3411 Malignant neoplasm of upper lobe, right bronchus or lung: Secondary | ICD-10-CM

## 2019-05-14 DIAGNOSIS — Z79899 Other long term (current) drug therapy: Secondary | ICD-10-CM | POA: Insufficient documentation

## 2019-05-14 DIAGNOSIS — Z5112 Encounter for antineoplastic immunotherapy: Secondary | ICD-10-CM | POA: Diagnosis not present

## 2019-05-14 DIAGNOSIS — R5382 Chronic fatigue, unspecified: Secondary | ICD-10-CM

## 2019-05-14 DIAGNOSIS — C3491 Malignant neoplasm of unspecified part of right bronchus or lung: Secondary | ICD-10-CM

## 2019-05-14 LAB — CBC WITH DIFFERENTIAL (CANCER CENTER ONLY)
Abs Immature Granulocytes: 0.01 10*3/uL (ref 0.00–0.07)
Basophils Absolute: 0 10*3/uL (ref 0.0–0.1)
Basophils Relative: 0 %
Eosinophils Absolute: 0.1 10*3/uL (ref 0.0–0.5)
Eosinophils Relative: 2 %
HCT: 36.7 % — ABNORMAL LOW (ref 39.0–52.0)
Hemoglobin: 11.9 g/dL — ABNORMAL LOW (ref 13.0–17.0)
Immature Granulocytes: 0 %
Lymphocytes Relative: 31 %
Lymphs Abs: 1.5 10*3/uL (ref 0.7–4.0)
MCH: 28.7 pg (ref 26.0–34.0)
MCHC: 32.4 g/dL (ref 30.0–36.0)
MCV: 88.6 fL (ref 80.0–100.0)
Monocytes Absolute: 0.5 10*3/uL (ref 0.1–1.0)
Monocytes Relative: 11 %
Neutro Abs: 2.7 10*3/uL (ref 1.7–7.7)
Neutrophils Relative %: 56 %
Platelet Count: 199 10*3/uL (ref 150–400)
RBC: 4.14 MIL/uL — ABNORMAL LOW (ref 4.22–5.81)
RDW: 15.6 % — ABNORMAL HIGH (ref 11.5–15.5)
WBC Count: 4.9 10*3/uL (ref 4.0–10.5)
nRBC: 0 % (ref 0.0–0.2)

## 2019-05-14 LAB — CMP (CANCER CENTER ONLY)
ALT: 9 U/L (ref 0–44)
AST: 17 U/L (ref 15–41)
Albumin: 3.8 g/dL (ref 3.5–5.0)
Alkaline Phosphatase: 50 U/L (ref 38–126)
Anion gap: 8 (ref 5–15)
BUN: 10 mg/dL (ref 8–23)
CO2: 27 mmol/L (ref 22–32)
Calcium: 8.7 mg/dL — ABNORMAL LOW (ref 8.9–10.3)
Chloride: 104 mmol/L (ref 98–111)
Creatinine: 1.01 mg/dL (ref 0.61–1.24)
GFR, Est AFR Am: >60 mL/min (ref >60)
GFR, Estimated: >60 mL/min (ref >60)
Glucose, Bld: 102 mg/dL — ABNORMAL HIGH (ref 70–99)
Potassium: 3.6 mmol/L (ref 3.5–5.1)
Sodium: 139 mmol/L (ref 135–145)
Total Bilirubin: <0.2 mg/dL — ABNORMAL LOW (ref 0.3–1.2)
Total Protein: 7.5 g/dL (ref 6.5–8.1)

## 2019-05-14 LAB — TSH: TSH: 3.252 u[IU]/mL (ref 0.320–4.118)

## 2019-05-14 MED ORDER — SODIUM CHLORIDE 0.9 % IV SOLN
360.0000 mg | Freq: Once | INTRAVENOUS | Status: AC
  Administered 2019-05-14: 360 mg via INTRAVENOUS
  Filled 2019-05-14: qty 24

## 2019-05-14 MED ORDER — SODIUM CHLORIDE 0.9% FLUSH
10.0000 mL | INTRAVENOUS | Status: DC | PRN
  Administered 2019-05-14: 10 mL
  Filled 2019-05-14: qty 10

## 2019-05-14 MED ORDER — FAMOTIDINE IN NACL 20-0.9 MG/50ML-% IV SOLN
20.0000 mg | Freq: Once | INTRAVENOUS | Status: AC
  Administered 2019-05-14: 13:00:00 20 mg via INTRAVENOUS

## 2019-05-14 MED ORDER — DIPHENHYDRAMINE HCL 50 MG/ML IJ SOLN
INTRAMUSCULAR | Status: AC
  Filled 2019-05-14: qty 1

## 2019-05-14 MED ORDER — HEPARIN SOD (PORK) LOCK FLUSH 100 UNIT/ML IV SOLN
500.0000 [IU] | Freq: Once | INTRAVENOUS | Status: AC | PRN
  Administered 2019-05-14: 500 [IU]
  Filled 2019-05-14: qty 5

## 2019-05-14 MED ORDER — SODIUM CHLORIDE 0.9 % IV SOLN
1.0000 mg/kg | Freq: Once | INTRAVENOUS | Status: AC
  Administered 2019-05-14: 15:00:00 70 mg via INTRAVENOUS
  Filled 2019-05-14: qty 14

## 2019-05-14 MED ORDER — SODIUM CHLORIDE 0.9 % IV SOLN
Freq: Once | INTRAVENOUS | Status: AC
  Filled 2019-05-14: qty 250

## 2019-05-14 MED ORDER — FAMOTIDINE IN NACL 20-0.9 MG/50ML-% IV SOLN
INTRAVENOUS | Status: AC
  Filled 2019-05-14: qty 50

## 2019-05-14 MED ORDER — SODIUM CHLORIDE 0.9% FLUSH
10.0000 mL | INTRAVENOUS | Status: DC | PRN
  Administered 2019-05-14: 11:00:00 10 mL
  Filled 2019-05-14: qty 10

## 2019-05-14 MED ORDER — DIPHENHYDRAMINE HCL 50 MG/ML IJ SOLN
25.0000 mg | Freq: Once | INTRAMUSCULAR | Status: AC
  Administered 2019-05-14: 25 mg via INTRAVENOUS

## 2019-05-14 NOTE — Patient Instructions (Signed)
[facility] Discharge Instructions for Patients Receiving Chemotherapy  Today you received the following chemotherapy agents: Rae Halsted  To help prevent nausea and vomiting after your treatment, we encourage you to take your nausea medication as directed.    If you develop nausea and vomiting that is not controlled by your nausea medication, call the clinic.   BELOW ARE SYMPTOMS THAT SHOULD BE REPORTED IMMEDIATELY:  *FEVER GREATER THAN 100.5 F  *CHILLS WITH OR WITHOUT FEVER  NAUSEA AND VOMITING THAT IS NOT CONTROLLED WITH YOUR NAUSEA MEDICATION  *UNUSUAL SHORTNESS OF BREATH  *UNUSUAL BRUISING OR BLEEDING  TENDERNESS IN MOUTH AND THROAT WITH OR WITHOUT PRESENCE OF ULCERS  *URINARY PROBLEMS  *BOWEL PROBLEMS  UNUSUAL RASH Items with * indicate a potential emergency and should be followed up as soon as possible.  Feel free to call the clinic should you have any questions or concerns. The clinic phone number is [phone].  Please show the [place] at check-in to the Emergency Department and triage nurse.

## 2019-05-14 NOTE — Progress Notes (Signed)
Per Dr. Julien Nordmann it is ok to treat with heart rate 103

## 2019-05-14 NOTE — Progress Notes (Signed)
[facility] [phone]   [phone]  OFFICE PROGRESS NOTE  [name], FNP [address] 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  He has disease recurrence in August 2020.  PRIOR THERAPY:  1) Course of concurrent chemoradiation with weekly carboplatin for AUC of 2 and paclitaxel 45 MG/M2. First dose 03/01/2015. He is status post 6 cycles. Last cycle was given 04/12/2015 with partial response. 2) Consolidation chemotherapy with reduced dose carboplatin for AUC of 5 and paclitaxel 175 MG/M2 every 3 weeks, status post 3 cycles.  CURRENT THERAPY: Systemic chemotherapy with carboplatin for AUC of 5, paclitaxel 175 mg/M2 with nivolumab 360 mg IV every 3 weeks in addition to ipilimumab 1 mg/KG every 6 weeks.  First dose 01/08/2019.  Status post 3 cycles.  Starting from cycle #3 the patient will be on maintenance treatment with ipilimumab every 6 weeks and nivolumab every 3 weeks.  INTERVAL HISTORY: Ivan Daniels 74 y.o. male returns to the clinic today for follow-up visit accompanied by his sister. The patient is feeling fine today with no concerning complaints. He is tolerating his maintenance treatment with ipilimumab and nivolumab fairly well. He denied having any current chest pain, shortness of breath, cough or hemoptysis. He denied having any fever or chills. He has no nausea, vomiting, diarrhea or constipation. He is here today for evaluation before starting cycle #4 of his treatment.  MEDICAL HISTORY: Past Medical History:  Diagnosis Date  . ALCOHOL ABUSE 11/12/2009   Qualifier: Diagnosis of  By: Hassell Done FNP, Tori Milks    . Allergy   . Anemia   . Anxiety    . BENIGN PROSTATIC HYPERTROPHY, HX OF 12/19/2006   Qualifier: Diagnosis of  By: Radene Ou MD, [place]    . Blood transfusion without reported diagnosis 2017  . Clotting disorder ([place])    takes PLAVIX  . Depression   . Emphysema of lung ([place])   . Full code status 02/10/2015  . GERD (gastroesophageal reflux disease)   . Hyperlipidemia   . Lung mass 01/25/2015   3.7 x 3.6 cm RUL spiculated lung mass with 2.0 cm right paratracheal lymph node suspicious for bronchogenic CA  . Non-small cell lung cancer ([place]) 01/27/15   non small-cell,squamous cell ca RUL  . Paranoid schizophrenia ([place]) 10/31/2009   Qualifier: Diagnosis of  By: Jorene Minors, [place]    . Primary spontaneous pneumothorax, left 01/25/2015  . TOBACCO ABUSE 05/24/2009   Qualifier: Diagnosis of  By: Hassell Done FNP, Nykedtra      ALLERGIES:  is allergic to benztropine mesylate and chlorpromazine hcl.  MEDICATIONS:  Current Outpatient Medications  Medication Sig Dispense Refill  . ALPHAGAN P 0.1 % SOLN Place 1 drop into both eyes 3 (three) times daily.  6  . aspirin EC 81 MG tablet Take 81 mg by mouth 2 (two) times daily.    . benztropine (COGENTIN) 0.5 MG tablet Take 0.5 mg by mouth at bedtime.     . chlorhexidine (PERIDEX) 0.12 % solution 1 mL by Mouth Rinse route as needed.   1  . clopidogrel (PLAVIX) 75 MG tablet Take 75 mg by mouth daily.     . ferrous fumarate (HEMOCYTE - 106 MG FE) 325 (106 FE) MG  TABS tablet Take 1 tablet by mouth daily.     . fluconazole (DIFLUCAN) 100 MG tablet Take 1 tablet (100 mg total) by mouth daily. 7 tablet 0  . haloperidol (HALDOL) 5 MG tablet Take 5 mg by mouth at bedtime.     . Multiple Vitamins-Minerals (MULTIVITAMIN WITH MINERALS) tablet Take 1 tablet by mouth daily.    . NONFORMULARY OR COMPOUNDED [place]  Combination Pain Cream -  Baclofen 2%, Doxepin 5%, Gabapentin 6%, Topiramate 2%, Pentoxifylline 3% Apply 1-2 grams to affected area 3-4 times daily Qty. 120 gm 3 refills 1 each 3   . ondansetron (ZOFRAN) 8 MG tablet Take 1 tablet (8 mg total) by mouth every 8 (eight) hours as needed for nausea or vomiting. 20 tablet 0  . simvastatin (ZOCOR) 20 MG tablet Take 20 mg by mouth at bedtime.     Marland Kitchen umeclidinium-vilanterol (ANORO ELLIPTA) 62.5-25 MCG/INH AEPB Inhale 1 puff into the lungs daily. 1 each 11   Current Facility-Administered Medications  Medication Dose Route Frequency Provider Last Rate Last Admin  . 0.9 %  sodium chloride infusion  500 mL Intravenous Continuous Irene Shipper, MD      . 0.9 %  sodium chloride infusion  500 mL Intravenous Continuous Irene Shipper, MD      . 0.9 %  sodium chloride infusion  500 mL Intravenous Continuous Irene Shipper, MD        SURGICAL HISTORY:  Past Surgical History:  Procedure Laterality Date  . APPENDECTOMY    . CHEST TUBE INSERTION Left   . COLONOSCOPY    . INSERTION CENTRAL VENOUS ACCESS DEVICE W/ SUBCUTANEOUS PORT Right   . LUNG BIOPSY Right 01/27/2015   Procedure: Right Upper Lobe Bronchus BIOPSY;  Surgeon: Grace Isaac, MD;  Location: [facility];  Service: Thoracic;  Laterality: Right;  Marland Kitchen VIDEO BRONCHOSCOPY WITH ENDOBRONCHIAL ULTRASOUND N/A 01/27/2015   Procedure: VIDEO BRONCHOSCOPY WITH ENDOBRONCHIAL ULTRASOUND;  Surgeon: Grace Isaac, MD;  Location: [facility] OR;  Service: Thoracic;  Laterality: N/A;    REVIEW OF SYSTEMS:  A comprehensive review of systems was negative.   PHYSICAL EXAMINATION: General appearance: alert, cooperative and no distress Head: Normocephalic, without obvious abnormality, atraumatic Neck: no adenopathy, no JVD, supple, symmetrical, trachea midline and thyroid not enlarged, symmetric, no tenderness/mass/nodules Lymph nodes: Cervical, supraclavicular, and axillary nodes normal. Resp: clear to auscultation bilaterally Back: symmetric, no curvature. ROM normal. No CVA tenderness. Cardio: regular rate and rhythm, S1, S2 normal, no murmur, click, rub or gallop GI: soft, non-tender; bowel sounds  normal; no masses,  no organomegaly Extremities: extremities normal, atraumatic, no cyanosis or edema  ECOG PERFORMANCE STATUS: 1 - Symptomatic but completely ambulatory  Blood pressure 122/76, pulse (!) 105, temperature 98.5 F (36.9 C), temperature source Oral, resp. rate 18, height 5\' 11"  (1.803 m), weight 150 lb 4.8 oz (68.2 kg), SpO2 100 %.  LABORATORY DATA: Lab Results  Component Value Date   WBC 3.8 (L) 04/23/2019   HGB 12.2 (L) 04/23/2019   HCT 37.7 (L) 04/23/2019   MCV 89.8 04/23/2019   PLT 201 04/23/2019      Chemistry      Component Value Date/Time   NA 140 04/23/2019 0950   NA 140 02/08/2017 0822   K 3.5 04/23/2019 0950   K 4.5 02/08/2017 0822   CL 104 04/23/2019 0950   CO2 26 04/23/2019 0950   CO2 25 02/08/2017 0822   BUN 13 04/23/2019 0950   BUN 8.8  02/08/2017 0822   CREATININE 1.06 04/23/2019 0950   CREATININE 1.1 02/08/2017 0822      Component Value Date/Time   CALCIUM 9.2 04/23/2019 0950   CALCIUM 9.6 02/08/2017 0822   ALKPHOS 49 04/23/2019 0950   ALKPHOS 53 02/08/2017 0822   AST 18 04/23/2019 0950   AST 19 02/08/2017 0822   ALT 10 04/23/2019 0950   ALT 9 02/08/2017 0822   BILITOT <0.2 (L) 04/23/2019 0950   BILITOT <0.22 02/08/2017 3888       RADIOGRAPHIC STUDIES: No results found.  ASSESSMENT AND PLAN:  This is a very pleasant 74 years old African-American male with recurrent non-small cell lung cancer initially diagnosed as stage IIIA non-small cell lung cancer, squamous cell carcinoma status post concurrent chemoradiation followed by consolidation chemotherapy. The patient has been in observation for close to 3 years. He had evidence for disease recurrence on the recent imaging studies including CT scan of the chest as well as PET scan. The patient is started  treatment with systemic chemotherapy with carboplatin for AUC of 5, paclitaxel 175 mg/M2 as well as combination of immunotherapy with nivolumab 360 mg IV every 3 weeks and ipilimumab 1  mg/KG every 6 weeks.  The chemotherapy will be given only for 2 cycles and then the patient will be maintained on immunotherapy with nivolumab and ipilimumab for a total of 2 years if he has no evidence for disease progression or unacceptable toxicity.  He is status post 3 cycles of treatment with systemic chemotherapy as well as immunotherapy with ipilimumab and nivolumab. Starting from cycle #3 the patient will be on maintenance treatment with ipilimumab 1 mg/KG every 6 weeks in addition to nivolumab 360 MG IV every 3 weeks. The patient has been tolerating this treatment well with no concerning adverse effects. I recommended for him to proceed with day 1 of cycle #4 as planned. He will come back for follow-up visit in 3 weeks for evaluation before starting day 22 of cycle #4. I will consider the patient for restaging scan before starting cycle #5. I will order the scan with his next visit. He was advised to call immediately if he has any concerning symptoms in the interval.  Disclaimer: This note was dictated with voice recognition software. Similar sounding words can inadvertently be transcribed and may not be corrected upon review.

## 2019-05-15 ENCOUNTER — Telehealth: Payer: Self-pay | Admitting: Internal Medicine

## 2019-05-15 NOTE — Telephone Encounter (Signed)
Scheduled per los. Called and left msg. Mailed printout  °

## 2019-05-16 ENCOUNTER — Encounter: Payer: Self-pay | Admitting: Podiatry

## 2019-05-16 ENCOUNTER — Other Ambulatory Visit: Payer: Self-pay

## 2019-05-16 ENCOUNTER — Ambulatory Visit (INDEPENDENT_AMBULATORY_CARE_PROVIDER_SITE_OTHER): Payer: Medicare Other | Admitting: Podiatry

## 2019-05-16 DIAGNOSIS — D689 Coagulation defect, unspecified: Secondary | ICD-10-CM

## 2019-05-16 DIAGNOSIS — Z89421 Acquired absence of other right toe(s): Secondary | ICD-10-CM

## 2019-05-16 DIAGNOSIS — Q828 Other specified congenital malformations of skin: Secondary | ICD-10-CM

## 2019-05-16 NOTE — Progress Notes (Signed)
This patient presents to the office for treatment of his calluses which have developed on both feet following frostbite. He says these calluses are painful walking and wearing his shoes. He presents to the office for continued preventative foot care services. Patient is taking plavix.  Vascular  Dorsalis pedis and posterior tibial pulses are weakly  palpable  B/L.  Capillary return  WNL.  Temperature gradient is  WNL.  Skin turgor  WNL  Sensorium  Senn Weinstein monofilament wire  WNL. Normal tactile sensation.  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  Orthopedic  Exam  Muscle tone and muscle strength  WNL.  No limitations of motion feet  B/L.  No crepitus or joint effusion noted.  Foot type is unremarkable and digits show no abnormalities.  Amputation digits  B/L    Skin  No open lesions.  Normal skin texture and turgor. Porokeratosis sub 2 right.  Callus at distal aspect left foot.    Porokeratosis  B/L.  Debridement of porokeratosis/callus  B/L.  RTC 10 weeks.   Gardiner Barefoot DPM

## 2019-06-03 ENCOUNTER — Inpatient Hospital Stay: Payer: Medicare Other

## 2019-06-03 ENCOUNTER — Other Ambulatory Visit: Payer: Self-pay

## 2019-06-03 ENCOUNTER — Inpatient Hospital Stay (HOSPITAL_BASED_OUTPATIENT_CLINIC_OR_DEPARTMENT_OTHER): Payer: Medicare Other | Admitting: Internal Medicine

## 2019-06-03 ENCOUNTER — Encounter: Payer: Self-pay | Admitting: Internal Medicine

## 2019-06-03 VITALS — HR 97

## 2019-06-03 VITALS — BP 119/77 | HR 103 | Temp 97.3°F | Resp 18 | Ht 71.0 in | Wt 148.9 lb

## 2019-06-03 DIAGNOSIS — R5382 Chronic fatigue, unspecified: Secondary | ICD-10-CM

## 2019-06-03 DIAGNOSIS — C3490 Malignant neoplasm of unspecified part of unspecified bronchus or lung: Secondary | ICD-10-CM | POA: Diagnosis not present

## 2019-06-03 DIAGNOSIS — Z5112 Encounter for antineoplastic immunotherapy: Secondary | ICD-10-CM

## 2019-06-03 DIAGNOSIS — C3491 Malignant neoplasm of unspecified part of right bronchus or lung: Secondary | ICD-10-CM

## 2019-06-03 DIAGNOSIS — Z79899 Other long term (current) drug therapy: Secondary | ICD-10-CM | POA: Diagnosis not present

## 2019-06-03 DIAGNOSIS — J449 Chronic obstructive pulmonary disease, unspecified: Secondary | ICD-10-CM

## 2019-06-03 DIAGNOSIS — C3411 Malignant neoplasm of upper lobe, right bronchus or lung: Secondary | ICD-10-CM

## 2019-06-03 LAB — CBC WITH DIFFERENTIAL (CANCER CENTER ONLY)
Abs Immature Granulocytes: 0 10*3/uL (ref 0.00–0.07)
Basophils Absolute: 0 10*3/uL (ref 0.0–0.1)
Basophils Relative: 0 %
Eosinophils Absolute: 0.1 10*3/uL (ref 0.0–0.5)
Eosinophils Relative: 1 %
HCT: 38 % — ABNORMAL LOW (ref 39.0–52.0)
Hemoglobin: 12.3 g/dL — ABNORMAL LOW (ref 13.0–17.0)
Immature Granulocytes: 0 %
Lymphocytes Relative: 30 %
Lymphs Abs: 1.4 10*3/uL (ref 0.7–4.0)
MCH: 29 pg (ref 26.0–34.0)
MCHC: 32.4 g/dL (ref 30.0–36.0)
MCV: 89.6 fL (ref 80.0–100.0)
Monocytes Absolute: 0.4 10*3/uL (ref 0.1–1.0)
Monocytes Relative: 9 %
Neutro Abs: 2.8 10*3/uL (ref 1.7–7.7)
Neutrophils Relative %: 60 %
Platelet Count: 218 10*3/uL (ref 150–400)
RBC: 4.24 MIL/uL (ref 4.22–5.81)
RDW: 15.3 % (ref 11.5–15.5)
WBC Count: 4.7 10*3/uL (ref 4.0–10.5)
nRBC: 0 % (ref 0.0–0.2)

## 2019-06-03 LAB — CMP (CANCER CENTER ONLY)
ALT: 9 U/L (ref 0–44)
AST: 17 U/L (ref 15–41)
Albumin: 3.8 g/dL (ref 3.5–5.0)
Alkaline Phosphatase: 50 U/L (ref 38–126)
Anion gap: 9 (ref 5–15)
BUN: 14 mg/dL (ref 8–23)
CO2: 26 mmol/L (ref 22–32)
Calcium: 9.1 mg/dL (ref 8.9–10.3)
Chloride: 104 mmol/L (ref 98–111)
Creatinine: 1.16 mg/dL (ref 0.61–1.24)
GFR, Est AFR Am: >60 mL/min (ref >60)
GFR, Estimated: >60 mL/min (ref >60)
Glucose, Bld: 98 mg/dL (ref 70–99)
Potassium: 3.7 mmol/L (ref 3.5–5.1)
Sodium: 139 mmol/L (ref 135–145)
Total Bilirubin: <0.2 mg/dL — ABNORMAL LOW (ref 0.3–1.2)
Total Protein: 7.6 g/dL (ref 6.5–8.1)

## 2019-06-03 LAB — TSH: TSH: 3.778 u[IU]/mL (ref 0.320–4.118)

## 2019-06-03 MED ORDER — SODIUM CHLORIDE 0.9 % IV SOLN
Freq: Once | INTRAVENOUS | Status: AC
  Filled 2019-06-03: qty 250

## 2019-06-03 MED ORDER — SODIUM CHLORIDE 0.9% FLUSH
10.0000 mL | INTRAVENOUS | Status: DC | PRN
  Administered 2019-06-03: 10 mL
  Filled 2019-06-03: qty 10

## 2019-06-03 MED ORDER — SODIUM CHLORIDE 0.9 % IV SOLN
360.0000 mg | Freq: Once | INTRAVENOUS | Status: AC
  Administered 2019-06-03: 360 mg via INTRAVENOUS
  Filled 2019-06-03: qty 12

## 2019-06-03 MED ORDER — HEPARIN SOD (PORK) LOCK FLUSH 100 UNIT/ML IV SOLN
500.0000 [IU] | Freq: Once | INTRAVENOUS | Status: AC | PRN
  Administered 2019-06-03: 500 [IU]
  Filled 2019-06-03: qty 5

## 2019-06-03 NOTE — Progress Notes (Signed)
[facility] [phone]   [phone]  OFFICE PROGRESS NOTE  [name], FNP [address] 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  He has disease recurrence in August 2020.  PRIOR THERAPY:  1) Course of concurrent chemoradiation with weekly carboplatin for AUC of 2 and paclitaxel 45 MG/M2. First dose 03/01/2015. He is status post 6 cycles. Last cycle was given 04/12/2015 with partial response. 2) Consolidation chemotherapy with reduced dose carboplatin for AUC of 5 and paclitaxel 175 MG/M2 every 3 weeks, status post 3 cycles.  CURRENT THERAPY: Systemic chemotherapy with carboplatin for AUC of 5, paclitaxel 175 mg/M2 with nivolumab 360 mg IV every 3 weeks in addition to ipilimumab 1 mg/KG every 6 weeks.  First dose 01/08/2019.  Status post 3 cycles.  Starting from cycle #3 the patient will be on maintenance treatment with ipilimumab every 6 weeks and nivolumab every 3 weeks.  INTERVAL HISTORY: Ivan Daniels 74 y.o. male returns to the clinic today for follow-up visit accompanied by his sister.  The patient is feeling fine today with no concerning complaints except for occasional shortness of breath with exertion.  He denied having any chest pain, cough or hemoptysis.  He has no nausea, vomiting, diarrhea or constipation.  He has no fever or chills.  He has been tolerating his treatment with immunotherapy fairly well.  The patient is here today for evaluation before starting day #22 of cycle #4.   MEDICAL HISTORY: Past Medical History:  Diagnosis Date  . ALCOHOL ABUSE 11/12/2009   Qualifier: Diagnosis of  By: Hassell Done FNP, Tori Milks    . Allergy   . Anemia   .  Anxiety   . BENIGN PROSTATIC HYPERTROPHY, HX OF 12/19/2006   Qualifier: Diagnosis of  By: Radene Ou MD, [place]    . Blood transfusion without reported diagnosis 2017  . Clotting disorder ([facility])    takes PLAVIX  . Depression   . Emphysema of lung ([facility])   . Full code status 02/10/2015  . GERD (gastroesophageal reflux disease)   . Hyperlipidemia   . Lung mass 01/25/2015   3.7 x 3.6 cm RUL spiculated lung mass with 2.0 cm right paratracheal lymph node suspicious for bronchogenic CA  . Non-small cell lung cancer ([facility]) 01/27/15   non small-cell,squamous cell ca RUL  . Paranoid schizophrenia ([facility]) 10/31/2009   Qualifier: Diagnosis of  By: Jorene Minors, [place]    . Primary spontaneous pneumothorax, left 01/25/2015  . TOBACCO ABUSE 05/24/2009   Qualifier: Diagnosis of  By: Hassell Done FNP, [place]      ALLERGIES:  is allergic to benztropine mesylate and chlorpromazine hcl.  MEDICATIONS:  Current Outpatient Medications  Medication Sig Dispense Refill  . ALPHAGAN P 0.1 % SOLN Place 1 drop into both eyes 3 (three) times daily.  6  . aspirin EC 81 MG tablet Take 81 mg by mouth 2 (two) times daily.    . benztropine (COGENTIN) 0.5 MG tablet Take 0.5 mg by mouth at bedtime.     . chlorhexidine (PERIDEX) 0.12 % solution 1 mL by Mouth Rinse route as needed.   1  . clopidogrel (PLAVIX) 75 MG tablet Take 75 mg by mouth daily.     . ferrous fumarate (  HEMOCYTE - 106 MG FE) 325 (106 FE) MG TABS tablet Take 1 tablet by mouth daily.     . fluconazole (DIFLUCAN) 100 MG tablet Take 1 tablet (100 mg total) by mouth daily. 7 tablet 0  . haloperidol (HALDOL) 5 MG tablet Take 5 mg by mouth at bedtime.     . Multiple Vitamins-Minerals (MULTIVITAMIN WITH MINERALS) tablet Take 1 tablet by mouth daily.    . NONFORMULARY OR COMPOUNDED [place]  Combination Pain Cream -  Baclofen 2%, Doxepin 5%, Gabapentin 6%, Topiramate 2%, Pentoxifylline 3% Apply 1-2 grams to affected area 3-4 times daily Qty. 120 gm 3 refills  1 each 3  . ondansetron (ZOFRAN) 8 MG tablet Take 1 tablet (8 mg total) by mouth every 8 (eight) hours as needed for nausea or vomiting. 20 tablet 0  . simvastatin (ZOCOR) 20 MG tablet Take 20 mg by mouth at bedtime.     Marland Kitchen umeclidinium-vilanterol (ANORO ELLIPTA) 62.5-25 MCG/INH AEPB Inhale 1 puff into the lungs daily. 1 each 11   Current Facility-Administered Medications  Medication Dose Route Frequency Provider Last Rate Last Admin  . 0.9 %  sodium chloride infusion  500 mL Intravenous Continuous Irene Shipper, MD      . 0.9 %  sodium chloride infusion  500 mL Intravenous Continuous Irene Shipper, MD      . 0.9 %  sodium chloride infusion  500 mL Intravenous Continuous Irene Shipper, MD        SURGICAL HISTORY:  Past Surgical History:  Procedure Laterality Date  . APPENDECTOMY    . CHEST TUBE INSERTION Left   . COLONOSCOPY    . INSERTION CENTRAL VENOUS ACCESS DEVICE W/ SUBCUTANEOUS PORT Right   . LUNG BIOPSY Right 01/27/2015   Procedure: Right Upper Lobe Bronchus BIOPSY;  Surgeon: Grace Isaac, MD;  Location: [facility];  Service: Thoracic;  Laterality: Right;  Marland Kitchen VIDEO BRONCHOSCOPY WITH ENDOBRONCHIAL ULTRASOUND N/A 01/27/2015   Procedure: VIDEO BRONCHOSCOPY WITH ENDOBRONCHIAL ULTRASOUND;  Surgeon: Grace Isaac, MD;  Location: [facility];  Service: Thoracic;  Laterality: N/A;    REVIEW OF SYSTEMS:  A comprehensive review of systems was negative except for: Respiratory: positive for dyspnea on exertion   PHYSICAL EXAMINATION: General appearance: alert, cooperative and no distress Head: Normocephalic, without obvious abnormality, atraumatic Neck: no adenopathy, no JVD, supple, symmetrical, trachea midline and thyroid not enlarged, symmetric, no tenderness/mass/nodules Lymph nodes: Cervical, supraclavicular, and axillary nodes normal. Resp: clear to auscultation bilaterally Back: symmetric, no curvature. ROM normal. No CVA tenderness. Cardio: regular rate and rhythm, S1, S2 normal, no  murmur, click, rub or gallop GI: soft, non-tender; bowel sounds normal; no masses,  no organomegaly Extremities: extremities normal, atraumatic, no cyanosis or edema  ECOG PERFORMANCE STATUS: 1 - Symptomatic but completely ambulatory  Blood pressure 119/77, pulse (!) 103, temperature (!) 97.3 F (36.3 C), temperature source Oral, resp. rate 18, height 5\' 11"  (1.803 m), weight 148 lb 14.4 oz (67.5 kg), SpO2 100 %.  LABORATORY DATA: Lab Results  Component Value Date   WBC 4.7 06/03/2019   HGB 12.3 (L) 06/03/2019   HCT 38.0 (L) 06/03/2019   MCV 89.6 06/03/2019   PLT 218 06/03/2019      Chemistry      Component Value Date/Time   NA 139 06/03/2019 0840   NA 140 02/08/2017 0822   K 3.7 06/03/2019 0840   K 4.5 02/08/2017 0822   CL 104 06/03/2019 0840   CO2 26 06/03/2019  0840   CO2 25 02/08/2017 0822   BUN 14 06/03/2019 0840   BUN 8.8 02/08/2017 0822   CREATININE 1.16 06/03/2019 0840   CREATININE 1.1 02/08/2017 0822      Component Value Date/Time   CALCIUM 9.1 06/03/2019 0840   CALCIUM 9.6 02/08/2017 0822   ALKPHOS 50 06/03/2019 0840   ALKPHOS 53 02/08/2017 0822   AST 17 06/03/2019 0840   AST 19 02/08/2017 0822   ALT 9 06/03/2019 0840   ALT 9 02/08/2017 0822   BILITOT <0.2 (L) 06/03/2019 0840   BILITOT <0.22 02/08/2017 4982       RADIOGRAPHIC STUDIES: No results found.  ASSESSMENT AND PLAN:  This is a very pleasant 74 years old African-American male with recurrent non-small cell lung cancer initially diagnosed as stage IIIA non-small cell lung cancer, squamous cell carcinoma status post concurrent chemoradiation followed by consolidation chemotherapy. The patient has been in observation for close to 3 years. He had evidence for disease recurrence on the recent imaging studies including CT scan of the chest as well as PET scan. The patient is started  treatment with systemic chemotherapy with carboplatin for AUC of 5, paclitaxel 175 mg/M2 as well as combination of  immunotherapy with nivolumab 360 mg IV every 3 weeks and ipilimumab 1 mg/KG every 6 weeks.  The chemotherapy will be given only for 2 cycles and then the patient will be maintained on immunotherapy with nivolumab and ipilimumab for a total of 2 years if he has no evidence for disease progression or unacceptable toxicity.  He is status post 3 cycles of treatment with systemic chemotherapy as well as immunotherapy with ipilimumab and nivolumab. Starting from cycle #3 the patient will be on maintenance treatment with ipilimumab 1 mg/KG every 6 weeks in addition to nivolumab 360 MG IV every 3 weeks. The patient has been tolerating this treatment well with no concerning adverse effects. I recommended for him to proceed with day #22 of cycle #4 today as planned. I will see him back for follow-up visit in 3 weeks for evaluation after repeating CT scan of the chest for restaging of his disease. He was advised to call immediately if he has any concerning symptoms in the interval. The patient and his sister agreed to the current plan.  Disclaimer: This note was dictated with voice recognition software. Similar sounding words can inadvertently be transcribed and may not be corrected upon review.

## 2019-06-03 NOTE — Patient Instructions (Signed)
[facility] Discharge Instructions for Patients Receiving Chemotherapy  Today you received the following chemotherapy agents:  Nivolumab  To help prevent nausea and vomiting after your treatment, we encourage you to take your nausea medication as prescribed.   If you develop nausea and vomiting that is not controlled by your nausea medication, call the clinic.   BELOW ARE SYMPTOMS THAT SHOULD BE REPORTED IMMEDIATELY:  *FEVER GREATER THAN 100.5 F  *CHILLS WITH OR WITHOUT FEVER  NAUSEA AND VOMITING THAT IS NOT CONTROLLED WITH YOUR NAUSEA MEDICATION  *UNUSUAL SHORTNESS OF BREATH  *UNUSUAL BRUISING OR BLEEDING  TENDERNESS IN MOUTH AND THROAT WITH OR WITHOUT PRESENCE OF ULCERS  *URINARY PROBLEMS  *BOWEL PROBLEMS  UNUSUAL RASH Items with * indicate a potential emergency and should be followed up as soon as possible.  Feel free to call the clinic should you have any questions or concerns. The clinic phone number is [phone].  Please show the CHEMO ALERT CARD at check-in to the Emergency Department and triage nurse.   

## 2019-06-03 NOTE — Patient Instructions (Signed)

## 2019-06-04 ENCOUNTER — Telehealth: Payer: Self-pay | Admitting: Internal Medicine

## 2019-06-04 NOTE — Telephone Encounter (Signed)
Scheduled per los. Called and left msg. Mailed printout  °

## 2019-06-23 ENCOUNTER — Other Ambulatory Visit: Payer: Self-pay

## 2019-06-23 ENCOUNTER — Ambulatory Visit (HOSPITAL_COMMUNITY)
Admission: RE | Admit: 2019-06-23 | Discharge: 2019-06-23 | Disposition: A | Discharge status: Home / Self Care | Payer: Medicare Other | Source: Ambulatory Visit | Attending: Internal Medicine | Admitting: Internal Medicine

## 2019-06-23 ENCOUNTER — Encounter (HOSPITAL_COMMUNITY): Payer: Self-pay

## 2019-06-23 DIAGNOSIS — J439 Emphysema, unspecified: Secondary | ICD-10-CM | POA: Diagnosis not present

## 2019-06-23 DIAGNOSIS — C3490 Malignant neoplasm of unspecified part of unspecified bronchus or lung: Secondary | ICD-10-CM | POA: Insufficient documentation

## 2019-06-23 MED ORDER — SODIUM CHLORIDE (PF) 0.9 % IJ SOLN
INTRAMUSCULAR | Status: AC
  Filled 2019-06-23: qty 50

## 2019-06-23 MED ORDER — IOHEXOL 300 MG/ML  SOLN
75.0000 mL | Freq: Once | INTRAMUSCULAR | Status: AC | PRN
  Administered 2019-06-23: 08:00:00 75 mL via INTRAVENOUS

## 2019-06-23 MED ORDER — HEPARIN SOD (PORK) LOCK FLUSH 100 UNIT/ML IV SOLN
INTRAVENOUS | Status: AC
  Filled 2019-06-23: qty 5

## 2019-06-23 MED ORDER — HEPARIN SOD (PORK) LOCK FLUSH 100 UNIT/ML IV SOLN
500.0000 [IU] | Freq: Once | INTRAVENOUS | Status: AC
  Administered 2019-06-23: 500 [IU] via INTRAVENOUS

## 2019-06-25 ENCOUNTER — Inpatient Hospital Stay: Payer: Medicare Other

## 2019-06-25 ENCOUNTER — Inpatient Hospital Stay: Payer: Medicare Other | Attending: Internal Medicine | Admitting: Internal Medicine

## 2019-06-25 ENCOUNTER — Other Ambulatory Visit: Payer: Self-pay

## 2019-06-25 ENCOUNTER — Encounter: Payer: Self-pay | Admitting: Internal Medicine

## 2019-06-25 VITALS — HR 99

## 2019-06-25 VITALS — BP 113/76 | HR 106 | Temp 97.8°F | Resp 20 | Ht 71.0 in | Wt 151.5 lb

## 2019-06-25 DIAGNOSIS — C3411 Malignant neoplasm of upper lobe, right bronchus or lung: Secondary | ICD-10-CM | POA: Diagnosis not present

## 2019-06-25 DIAGNOSIS — Z5112 Encounter for antineoplastic immunotherapy: Secondary | ICD-10-CM | POA: Insufficient documentation

## 2019-06-25 DIAGNOSIS — R5382 Chronic fatigue, unspecified: Secondary | ICD-10-CM

## 2019-06-25 DIAGNOSIS — Z79899 Other long term (current) drug therapy: Secondary | ICD-10-CM | POA: Diagnosis not present

## 2019-06-25 DIAGNOSIS — J439 Emphysema, unspecified: Secondary | ICD-10-CM | POA: Diagnosis not present

## 2019-06-25 DIAGNOSIS — C3491 Malignant neoplasm of unspecified part of right bronchus or lung: Secondary | ICD-10-CM

## 2019-06-25 DIAGNOSIS — J449 Chronic obstructive pulmonary disease, unspecified: Secondary | ICD-10-CM

## 2019-06-25 LAB — CBC WITH DIFFERENTIAL (CANCER CENTER ONLY)
Abs Immature Granulocytes: 0 10*3/uL (ref 0.00–0.07)
Basophils Absolute: 0 10*3/uL (ref 0.0–0.1)
Basophils Relative: 1 %
Eosinophils Absolute: 0.1 10*3/uL (ref 0.0–0.5)
Eosinophils Relative: 2 %
HCT: 37.6 % — ABNORMAL LOW (ref 39.0–52.0)
Hemoglobin: 12.5 g/dL — ABNORMAL LOW (ref 13.0–17.0)
Immature Granulocytes: 0 %
Lymphocytes Relative: 30 %
Lymphs Abs: 1.3 10*3/uL (ref 0.7–4.0)
MCH: 29.2 pg (ref 26.0–34.0)
MCHC: 33.2 g/dL (ref 30.0–36.0)
MCV: 87.9 fL (ref 80.0–100.0)
Monocytes Absolute: 0.4 10*3/uL (ref 0.1–1.0)
Monocytes Relative: 11 %
Neutro Abs: 2.4 10*3/uL (ref 1.7–7.7)
Neutrophils Relative %: 56 %
Platelet Count: 185 10*3/uL (ref 150–400)
RBC: 4.28 MIL/uL (ref 4.22–5.81)
RDW: 15.4 % (ref 11.5–15.5)
WBC Count: 4.2 10*3/uL (ref 4.0–10.5)
nRBC: 0 % (ref 0.0–0.2)

## 2019-06-25 LAB — CMP (CANCER CENTER ONLY)
ALT: 12 U/L (ref 0–44)
AST: 19 U/L (ref 15–41)
Albumin: 3.8 g/dL (ref 3.5–5.0)
Alkaline Phosphatase: 49 U/L (ref 38–126)
Anion gap: 10 (ref 5–15)
BUN: 10 mg/dL (ref 8–23)
CO2: 26 mmol/L (ref 22–32)
Calcium: 9 mg/dL (ref 8.9–10.3)
Chloride: 105 mmol/L (ref 98–111)
Creatinine: 1.18 mg/dL (ref 0.61–1.24)
GFR, Est AFR Am: >60 mL/min (ref >60)
GFR, Estimated: >60 mL/min (ref >60)
Glucose, Bld: 113 mg/dL — ABNORMAL HIGH (ref 70–99)
Potassium: 3.7 mmol/L (ref 3.5–5.1)
Sodium: 141 mmol/L (ref 135–145)
Total Bilirubin: <0.2 mg/dL — ABNORMAL LOW (ref 0.3–1.2)
Total Protein: 7.7 g/dL (ref 6.5–8.1)

## 2019-06-25 LAB — TSH: TSH: 2.235 u[IU]/mL (ref 0.320–4.118)

## 2019-06-25 MED ORDER — SODIUM CHLORIDE 0.9% FLUSH
10.0000 mL | INTRAVENOUS | Status: DC | PRN
  Administered 2019-06-25: 10 mL
  Filled 2019-06-25: qty 10

## 2019-06-25 MED ORDER — SODIUM CHLORIDE 0.9 % IV SOLN
Freq: Once | INTRAVENOUS | Status: AC
  Filled 2019-06-25: qty 250

## 2019-06-25 MED ORDER — FAMOTIDINE IN NACL 20-0.9 MG/50ML-% IV SOLN
20.0000 mg | Freq: Once | INTRAVENOUS | Status: AC
  Administered 2019-06-25: 20 mg via INTRAVENOUS

## 2019-06-25 MED ORDER — HEPARIN SOD (PORK) LOCK FLUSH 100 UNIT/ML IV SOLN
500.0000 [IU] | Freq: Once | INTRAVENOUS | Status: AC | PRN
  Administered 2019-06-25: 500 [IU]
  Filled 2019-06-25: qty 5

## 2019-06-25 MED ORDER — FAMOTIDINE IN NACL 20-0.9 MG/50ML-% IV SOLN
INTRAVENOUS | Status: AC
  Filled 2019-06-25: qty 50

## 2019-06-25 MED ORDER — DIPHENHYDRAMINE HCL 50 MG/ML IJ SOLN
25.0000 mg | Freq: Once | INTRAMUSCULAR | Status: AC
  Administered 2019-06-25: 25 mg via INTRAVENOUS

## 2019-06-25 MED ORDER — SODIUM CHLORIDE 0.9 % IV SOLN
360.0000 mg | Freq: Once | INTRAVENOUS | Status: AC
  Administered 2019-06-25: 360 mg via INTRAVENOUS
  Filled 2019-06-25: qty 10

## 2019-06-25 MED ORDER — SODIUM CHLORIDE 0.9 % IV SOLN
1.0000 mg/kg | Freq: Once | INTRAVENOUS | Status: AC
  Administered 2019-06-25: 70 mg via INTRAVENOUS
  Filled 2019-06-25: qty 14

## 2019-06-25 MED ORDER — DIPHENHYDRAMINE HCL 50 MG/ML IJ SOLN
INTRAMUSCULAR | Status: AC
  Filled 2019-06-25: qty 1

## 2019-06-25 NOTE — Patient Instructions (Signed)
[facility] Discharge Instructions for Patients Receiving Chemotherapy  Today you received the following chemotherapy agents: Opdivo and Yervoy.  To help prevent nausea and vomiting after your treatment, we encourage you to take your nausea medication as directed.    If you develop nausea and vomiting that is not controlled by your nausea medication, call the clinic.   BELOW ARE SYMPTOMS THAT SHOULD BE REPORTED IMMEDIATELY:  *FEVER GREATER THAN 100.5 F  *CHILLS WITH OR WITHOUT FEVER  NAUSEA AND VOMITING THAT IS NOT CONTROLLED WITH YOUR NAUSEA MEDICATION  *UNUSUAL SHORTNESS OF BREATH  *UNUSUAL BRUISING OR BLEEDING  TENDERNESS IN MOUTH AND THROAT WITH OR WITHOUT PRESENCE OF ULCERS  *URINARY PROBLEMS  *BOWEL PROBLEMS  UNUSUAL RASH Items with * indicate a potential emergency and should be followed up as soon as possible.  Feel free to call the clinic should you have any questions or concerns. The clinic phone number is [phone].  Please show the [facility] at check-in to the Emergency Department and triage nurse.

## 2019-06-25 NOTE — Patient Instructions (Signed)

## 2019-06-25 NOTE — Progress Notes (Signed)
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  He has disease recurrence in August 2020.  PRIOR THERAPY:  1) Course of concurrent chemoradiation with weekly carboplatin for AUC of 2 and paclitaxel 45 MG/M2. First dose 03/01/2015. He is status post 6 cycles. Last cycle was given 04/12/2015 with partial response. 2) Consolidation chemotherapy with reduced dose carboplatin for AUC of 5 and paclitaxel 175 MG/M2 every 3 weeks, status post 3 cycles.  CURRENT THERAPY: Systemic chemotherapy with carboplatin for AUC of 5, paclitaxel 175 mg/M2 with nivolumab 360 mg IV every 3 weeks in addition to ipilimumab 1 mg/KG every 6 weeks.  First dose 01/08/2019.  Status post 4 cycles.  Starting from cycle #3 the patient will be on maintenance treatment with ipilimumab every 6 weeks and nivolumab every 3 weeks.  INTERVAL HISTORY: Ivan Daniels 74 y.o. male returns to the clinic today for follow-up visit accompanied by his sister.  The patient is feeling fine today with no concerning complaints except for mild shortness of breath with exertion and cough.  He denied having any chest pain or hemoptysis.  He denied having any fever or chills.  He has no nausea, vomiting, diarrhea or constipation.  He denied having any headache or visual changes.  He has no significant weight loss or night sweats.  He has been tolerating his treatment fairly well.  He had repeat CT scan of the chest performed recently and is here for evaluation and discussion of his scan results.   MEDICAL HISTORY: Past Medical  History:  Diagnosis Date  . ALCOHOL ABUSE 11/12/2009   Qualifier: Diagnosis of  By: Hassell Done FNP, Tori Milks    . Allergy   . Anemia   . Anxiety   . BENIGN PROSTATIC HYPERTROPHY, HX OF 12/19/2006   Qualifier: Diagnosis of  By: Radene Ou MD, [place]    . Blood transfusion without reported diagnosis 2017  . Clotting disorder ([facility])    takes PLAVIX  . Depression   . Emphysema of lung ([facility])   . Full code status 02/10/2015  . GERD (gastroesophageal reflux disease)   . Hyperlipidemia   . Lung mass 01/25/2015   3.7 x 3.6 cm RUL spiculated lung mass with 2.0 cm right paratracheal lymph node suspicious for bronchogenic CA  . Non-small cell lung cancer ([facility]) 01/27/15   non small-cell,squamous cell ca RUL  . Paranoid schizophrenia ([facility]) 10/31/2009   Qualifier: Diagnosis of  By: Jorene Minors, [place]    . Primary spontaneous pneumothorax, left 01/25/2015  . TOBACCO ABUSE 05/24/2009   Qualifier: Diagnosis of  By: Hassell Done FNP, [place]      ALLERGIES:  is allergic to benztropine mesylate and chlorpromazine hcl.  MEDICATIONS:  Current Outpatient Medications  Medication Sig Dispense Refill  . ALPHAGAN P 0.1 % SOLN Place 1 drop into both eyes 3 (three) times daily.  6  . aspirin EC 81 MG tablet Take 81 mg by mouth 2 (two) times daily.    . benztropine (COGENTIN) 0.5 MG tablet Take 0.5 mg by mouth at bedtime.     . chlorhexidine (PERIDEX) 0.12 % solution 1 mL by Mouth Rinse  route as needed.   1  . clopidogrel (PLAVIX) 75 MG tablet Take 75 mg by mouth daily.     . ferrous fumarate (HEMOCYTE - 106 MG FE) 325 (106 FE) MG TABS tablet Take 1 tablet by mouth daily.     . haloperidol (HALDOL) 5 MG tablet Take 5 mg by mouth at bedtime.     . Multiple Vitamins-Minerals (MULTIVITAMIN WITH MINERALS) tablet Take 1 tablet by mouth daily.    . simvastatin (ZOCOR) 20 MG tablet Take 20 mg by mouth at bedtime.     Marland Kitchen umeclidinium-vilanterol (ANORO ELLIPTA) 62.5-25 MCG/INH AEPB Inhale 1 puff into the lungs daily. 1 each 11  .  NONFORMULARY OR COMPOUNDED [place]  Combination Pain Cream -  Baclofen 2%, Doxepin 5%, Gabapentin 6%, Topiramate 2%, Pentoxifylline 3% Apply 1-2 grams to affected area 3-4 times daily Qty. 120 gm 3 refills 1 each 3  . ondansetron (ZOFRAN) 8 MG tablet Take 1 tablet (8 mg total) by mouth every 8 (eight) hours as needed for nausea or vomiting. (Patient not taking: Reported on 06/25/2019) 20 tablet 0   Current Facility-Administered Medications  Medication Dose Route Frequency Provider Last Rate Last Admin  . 0.9 %  sodium chloride infusion  500 mL Intravenous Continuous Irene Shipper, MD      . 0.9 %  sodium chloride infusion  500 mL Intravenous Continuous Irene Shipper, MD      . 0.9 %  sodium chloride infusion  500 mL Intravenous Continuous Irene Shipper, MD        SURGICAL HISTORY:  Past Surgical History:  Procedure Laterality Date  . APPENDECTOMY    . CHEST TUBE INSERTION Left   . COLONOSCOPY    . INSERTION CENTRAL VENOUS ACCESS DEVICE W/ SUBCUTANEOUS PORT Right   . LUNG BIOPSY Right 01/27/2015   Procedure: Right Upper Lobe Bronchus BIOPSY;  Surgeon: Grace Isaac, MD;  Location: [facility];  Service: Thoracic;  Laterality: Right;  Marland Kitchen VIDEO BRONCHOSCOPY WITH ENDOBRONCHIAL ULTRASOUND N/A 01/27/2015   Procedure: VIDEO BRONCHOSCOPY WITH ENDOBRONCHIAL ULTRASOUND;  Surgeon: Grace Isaac, MD;  Location: [facility];  Service: Thoracic;  Laterality: N/A;    REVIEW OF SYSTEMS:  Constitutional: negative Eyes: negative Ears, nose, mouth, throat, and face: negative Respiratory: positive for cough and dyspnea on exertion Cardiovascular: negative Gastrointestinal: negative Genitourinary:negative Integument/breast: negative Hematologic/lymphatic: negative Musculoskeletal:negative Neurological: negative Behavioral/Psych: negative Endocrine: negative Allergic/Immunologic: negative   PHYSICAL EXAMINATION: General appearance: alert, cooperative, fatigued and no distress Head:  Normocephalic, without obvious abnormality, atraumatic Neck: no adenopathy, no JVD, supple, symmetrical, trachea midline and thyroid not enlarged, symmetric, no tenderness/mass/nodules Lymph nodes: Cervical, supraclavicular, and axillary nodes normal. Resp: clear to auscultation bilaterally Back: symmetric, no curvature. ROM normal. No CVA tenderness. Cardio: regular rate and rhythm, S1, S2 normal, no murmur, click, rub or gallop GI: soft, non-tender; bowel sounds normal; no masses,  no organomegaly Extremities: extremities normal, atraumatic, no cyanosis or edema Neurologic: Alert and oriented X 3, normal strength and tone. Normal symmetric reflexes. Normal coordination and gait  ECOG PERFORMANCE STATUS: 1 - Symptomatic but completely ambulatory  Blood pressure 113/76, pulse (!) 106, temperature 97.8 F (36.6 C), temperature source Temporal, resp. rate 20, height 5\' 11"  (1.803 m), weight 151 lb 8 oz (68.7 kg), SpO2 100 %.  LABORATORY DATA: Lab Results  Component Value Date   WBC 4.2 06/25/2019   HGB 12.5 (L) 06/25/2019   HCT 37.6 (L) 06/25/2019   MCV 87.9 06/25/2019  PLT 185 06/25/2019      Chemistry      Component Value Date/Time   NA 139 06/03/2019 0840   NA 140 02/08/2017 0822   K 3.7 06/03/2019 0840   K 4.5 02/08/2017 0822   CL 104 06/03/2019 0840   CO2 26 06/03/2019 0840   CO2 25 02/08/2017 0822   BUN 14 06/03/2019 0840   BUN 8.8 02/08/2017 0822   CREATININE 1.16 06/03/2019 0840   CREATININE 1.1 02/08/2017 0822      Component Value Date/Time   CALCIUM 9.1 06/03/2019 0840   CALCIUM 9.6 02/08/2017 0822   ALKPHOS 50 06/03/2019 0840   ALKPHOS 53 02/08/2017 0822   AST 17 06/03/2019 0840   AST 19 02/08/2017 0822   ALT 9 06/03/2019 0840   ALT 9 02/08/2017 0822   BILITOT <0.2 (L) 06/03/2019 0840   BILITOT <0.22 02/08/2017 0867       RADIOGRAPHIC STUDIES: CT Chest W Contrast  Result Date: 06/23/2019 CLINICAL DATA:  74 year old male with history of non-small  cell lung cancer. Chemotherapy in progress. Radiation therapy complete. Restaging examination. EXAM: CT CHEST WITH CONTRAST TECHNIQUE: Multidetector CT imaging of the chest was performed during intravenous contrast administration. CONTRAST:  50mL OMNIPAQUE IOHEXOL 300 MG/ML  SOLN COMPARISON:  Chest CT 03/10/2019. FINDINGS: Cardiovascular: Heart size is normal. There is no significant pericardial fluid, thickening or pericardial calcification. There is aortic atherosclerosis, as well as atherosclerosis of the great vessels of the mediastinum and the coronary arteries, including calcified atherosclerotic plaque in the left main, left anterior descending and right coronary arteries. Right internal jugular single-lumen porta cath with tip terminating in the right atrium. Mediastinum/Nodes: No pathologically enlarged mediastinal or hilar lymph nodes. Esophagus is unremarkable in appearance. No axillary lymphadenopathy. Lungs/Pleura: The mass-like area in the right upper lobe which corresponds to the area of hypermetabolism on prior PET-CT 12/25/2018 currently measures approximately 5.3 x 2.6 x 3.6 cm (axial image 61 of series 2), previously 4.1 x 2.7 x 3.6 cm on chest CT 03/10/2019. There is again chronic volume loss in adjacent portions of the right upper lobe and superior segment of the right lower lobe where there is also some traction bronchiectasis, most compatible with an area of chronic postradiation mass-like fibrosis. Previously noted small ground-glass attenuation nodule in the left upper lobe continues to decrease in size, currently measuring only 2 mm (axial image 87 of series 7). Previously noted nodularity in the right lower lobe has nearly completely resolved on today's examination (axial image 118 of series 7), indicative of a benign infectious or inflammatory process on the prior study. No new suspicious appearing pulmonary nodules or masses are noted. No acute consolidative airspace disease. Trace  right-sided pleural effusion loculated posteriorly, stable compared to the prior study. No left-sided pleural effusion. Paraseptal emphysema noted in the upper thorax bilaterally. Upper Abdomen: Multiple subcentimeter low-attenuation lesions in the liver, too small to characterize, but similar to prior studies and statistically likely to represent small cysts or biliary hamartomas. Aortic atherosclerosis. Musculoskeletal: There are no aggressive appearing lytic or blastic lesions noted in the visualized portions of the skeleton. IMPRESSION: 1. Today's study demonstrates progressive enlargement of the previously noted hypermetabolic lesion in the right upper lobe, concerning for progression of disease. 2. All other previously noted pulmonary nodules have regressed, suggesting benign infectious/inflammatory nodules on prior studies. 3. Aortic atherosclerosis, in addition to left main and 2 vessel coronary artery disease. Assessment for potential risk factor modification, dietary therapy or pharmacologic therapy may be  warranted, if clinically indicated. 4. Paraseptal emphysema. 5. Additional incidental findings, as above. Aortic Atherosclerosis (ICD10-I70.0) and Emphysema (ICD10-J43.9). Electronically Signed   By: Vinnie Langton M.D.   On: 06/23/2019 08:52    ASSESSMENT AND PLAN:  This is a very pleasant 74 years old African-American male with recurrent non-small cell lung cancer initially diagnosed as stage IIIA non-small cell lung cancer, squamous cell carcinoma status post concurrent chemoradiation followed by consolidation chemotherapy. The patient has been in observation for close to 3 years. He had evidence for disease recurrence on the recent imaging studies including CT scan of the chest as well as PET scan. The patient is started  treatment with systemic chemotherapy with carboplatin for AUC of 5, paclitaxel 175 mg/M2 as well as combination of immunotherapy with nivolumab 360 mg IV every 3 weeks and  ipilimumab 1 mg/KG every 6 weeks.  The chemotherapy will be given only for 2 cycles and then the patient will be maintained on immunotherapy with nivolumab and ipilimumab for a total of 2 years if he has no evidence for disease progression or unacceptable toxicity.  He is status post 4 cycles of treatment with systemic chemotherapy as well as immunotherapy with ipilimumab and nivolumab. Starting from cycle #3 the patient will be on maintenance treatment with ipilimumab 1 mg/KG every 6 weeks in addition to nivolumab 360 MG IV every 3 weeks. The patient continues to tolerate this treatment fairly well with no concerning adverse effects. He had repeat CT scan of the chest performed recently.  I personally and independently reviewed the scan with the patient and his sister.  His scan showed improvement in the pulmonary nodules except for slight enlargement of right upper lobe consolidation which probably related to changes in the confirmation of the scar in that area but disease progression could not be completely excluded. I recommended for the patient to continue his current treatment with ipilimumab and nivolumab and he will start cycle #5 today. He will come back for follow-up visit in 3 weeks for evaluation before starting day 22 of cycle #5. For hypertension the patient was advised to take his blood pressure medications as prescribed and to monitor it closely at home. The patient and his sister are in agreement with the current plan. He was advised to call immediately if he has any concerning symptoms in the interval.  Disclaimer: This note was dictated with voice recognition software. Similar sounding words can inadvertently be transcribed and may not be corrected upon review.

## 2019-06-27 ENCOUNTER — Telehealth: Payer: Self-pay | Admitting: Internal Medicine

## 2019-06-27 NOTE — Telephone Encounter (Signed)
Scheduled per los. Called and left msg. Mailed printout  °

## 2019-07-16 ENCOUNTER — Encounter: Payer: Self-pay | Admitting: Podiatry

## 2019-07-16 ENCOUNTER — Encounter: Payer: Self-pay | Admitting: Internal Medicine

## 2019-07-16 ENCOUNTER — Inpatient Hospital Stay: Payer: Medicare Other

## 2019-07-16 ENCOUNTER — Other Ambulatory Visit: Payer: Self-pay

## 2019-07-16 ENCOUNTER — Inpatient Hospital Stay: Payer: Medicare Other | Attending: Internal Medicine | Admitting: Internal Medicine

## 2019-07-16 ENCOUNTER — Ambulatory Visit (INDEPENDENT_AMBULATORY_CARE_PROVIDER_SITE_OTHER): Payer: Medicare Other | Admitting: Podiatry

## 2019-07-16 VITALS — HR 103

## 2019-07-16 VITALS — BP 149/84 | HR 109 | Temp 98.5°F | Resp 20 | Ht 71.0 in | Wt 152.0 lb

## 2019-07-16 DIAGNOSIS — J449 Chronic obstructive pulmonary disease, unspecified: Secondary | ICD-10-CM

## 2019-07-16 DIAGNOSIS — Q828 Other specified congenital malformations of skin: Secondary | ICD-10-CM

## 2019-07-16 DIAGNOSIS — C3411 Malignant neoplasm of upper lobe, right bronchus or lung: Secondary | ICD-10-CM

## 2019-07-16 DIAGNOSIS — R5382 Chronic fatigue, unspecified: Secondary | ICD-10-CM

## 2019-07-16 DIAGNOSIS — Z79899 Other long term (current) drug therapy: Secondary | ICD-10-CM | POA: Insufficient documentation

## 2019-07-16 DIAGNOSIS — D689 Coagulation defect, unspecified: Secondary | ICD-10-CM | POA: Diagnosis not present

## 2019-07-16 DIAGNOSIS — S98139S Complete traumatic amputation of one unspecified lesser toe, sequela: Secondary | ICD-10-CM

## 2019-07-16 DIAGNOSIS — C3491 Malignant neoplasm of unspecified part of right bronchus or lung: Secondary | ICD-10-CM

## 2019-07-16 DIAGNOSIS — Z5112 Encounter for antineoplastic immunotherapy: Secondary | ICD-10-CM | POA: Diagnosis not present

## 2019-07-16 DIAGNOSIS — I739 Peripheral vascular disease, unspecified: Secondary | ICD-10-CM

## 2019-07-16 LAB — CMP (CANCER CENTER ONLY)
ALT: 9 U/L (ref 0–44)
AST: 19 U/L (ref 15–41)
Albumin: 3.8 g/dL (ref 3.5–5.0)
Alkaline Phosphatase: 51 U/L (ref 38–126)
Anion gap: 9 (ref 5–15)
BUN: 10 mg/dL (ref 8–23)
CO2: 28 mmol/L (ref 22–32)
Calcium: 9.4 mg/dL (ref 8.9–10.3)
Chloride: 105 mmol/L (ref 98–111)
Creatinine: 1.01 mg/dL (ref 0.61–1.24)
GFR, Est AFR Am: >60 mL/min (ref >60)
GFR, Estimated: >60 mL/min (ref >60)
Glucose, Bld: 96 mg/dL (ref 70–99)
Potassium: 4 mmol/L (ref 3.5–5.1)
Sodium: 142 mmol/L (ref 135–145)
Total Bilirubin: <0.2 mg/dL — ABNORMAL LOW (ref 0.3–1.2)
Total Protein: 7.6 g/dL (ref 6.5–8.1)

## 2019-07-16 LAB — CBC WITH DIFFERENTIAL (CANCER CENTER ONLY)
Abs Immature Granulocytes: 0.02 10*3/uL (ref 0.00–0.07)
Basophils Absolute: 0 10*3/uL (ref 0.0–0.1)
Basophils Relative: 0 %
Eosinophils Absolute: 0 10*3/uL (ref 0.0–0.5)
Eosinophils Relative: 1 %
HCT: 38.7 % — ABNORMAL LOW (ref 39.0–52.0)
Hemoglobin: 12.5 g/dL — ABNORMAL LOW (ref 13.0–17.0)
Immature Granulocytes: 0 %
Lymphocytes Relative: 30 %
Lymphs Abs: 1.6 10*3/uL (ref 0.7–4.0)
MCH: 28.5 pg (ref 26.0–34.0)
MCHC: 32.3 g/dL (ref 30.0–36.0)
MCV: 88.4 fL (ref 80.0–100.0)
Monocytes Absolute: 0.6 10*3/uL (ref 0.1–1.0)
Monocytes Relative: 12 %
Neutro Abs: 3 10*3/uL (ref 1.7–7.7)
Neutrophils Relative %: 57 %
Platelet Count: 199 10*3/uL (ref 150–400)
RBC: 4.38 MIL/uL (ref 4.22–5.81)
RDW: 16.2 % — ABNORMAL HIGH (ref 11.5–15.5)
WBC Count: 5.2 10*3/uL (ref 4.0–10.5)
nRBC: 0 % (ref 0.0–0.2)

## 2019-07-16 LAB — TSH: TSH: 2.96 u[IU]/mL (ref 0.320–4.118)

## 2019-07-16 MED ORDER — SODIUM CHLORIDE 0.9 % IV SOLN
360.0000 mg | Freq: Once | INTRAVENOUS | Status: AC
  Administered 2019-07-16: 360 mg via INTRAVENOUS
  Filled 2019-07-16: qty 12

## 2019-07-16 MED ORDER — SODIUM CHLORIDE 0.9% FLUSH
10.0000 mL | INTRAVENOUS | Status: DC | PRN
  Administered 2019-07-16: 10 mL
  Filled 2019-07-16: qty 10

## 2019-07-16 MED ORDER — HEPARIN SOD (PORK) LOCK FLUSH 100 UNIT/ML IV SOLN
500.0000 [IU] | Freq: Once | INTRAVENOUS | Status: AC | PRN
  Administered 2019-07-16: 500 [IU]
  Filled 2019-07-16: qty 5

## 2019-07-16 MED ORDER — SODIUM CHLORIDE 0.9 % IV SOLN
Freq: Once | INTRAVENOUS | Status: AC
  Filled 2019-07-16: qty 250

## 2019-07-16 NOTE — Patient Instructions (Signed)
[facility] Discharge Instructions for Patients Receiving Chemotherapy  Today you received the following chemotherapy agents: Opdivo and Yervoy.  To help prevent nausea and vomiting after your treatment, we encourage you to take your nausea medication as directed.    If you develop nausea and vomiting that is not controlled by your nausea medication, call the clinic.   BELOW ARE SYMPTOMS THAT SHOULD BE REPORTED IMMEDIATELY:  *FEVER GREATER THAN 100.5 F  *CHILLS WITH OR WITHOUT FEVER  NAUSEA AND VOMITING THAT IS NOT CONTROLLED WITH YOUR NAUSEA MEDICATION  *UNUSUAL SHORTNESS OF BREATH  *UNUSUAL BRUISING OR BLEEDING  TENDERNESS IN MOUTH AND THROAT WITH OR WITHOUT PRESENCE OF ULCERS  *URINARY PROBLEMS  *BOWEL PROBLEMS  UNUSUAL RASH Items with * indicate a potential emergency and should be followed up as soon as possible.  Feel free to call the clinic should you have any questions or concerns. The clinic phone number is [phone].  Please show the [facility] at check-in to the Emergency Department and triage nurse.

## 2019-07-16 NOTE — Progress Notes (Signed)
[facility] [phone]   Fax:([phone]  OFFICE PROGRESS NOTE  [name], FNP [address] 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  He has disease recurrence in August 2020.  PRIOR THERAPY:  1) Course of concurrent chemoradiation with weekly carboplatin for AUC of 2 and paclitaxel 45 MG/M2. First dose 03/01/2015. He is status post 6 cycles. Last cycle was given 04/12/2015 with partial response. 2) Consolidation chemotherapy with reduced dose carboplatin for AUC of 5 and paclitaxel 175 MG/M2 every 3 weeks, status post 3 cycles.  CURRENT THERAPY: Systemic chemotherapy with carboplatin for AUC of 5, paclitaxel 175 mg/M2 with nivolumab 360 mg IV every 3 weeks in addition to ipilimumab 1 mg/KG every 6 weeks.  First dose 01/08/2019.  Status post 4 cycles.  Starting from cycle #3 the patient will be on maintenance treatment with ipilimumab every 6 weeks and nivolumab every 3 weeks.  INTERVAL HISTORY: Ivan Daniels 74 y.o. male returns to the clinic today for follow-up visit accompanied by his sister.  The patient is feeling fine today with no concerning complaints.  He denied having any current chest pain, shortness of breath, cough or hemoptysis.  He denied having any fever or chills.  He has no nausea, vomiting, diarrhea or constipation.  There is no significant weight loss or night sweats.  He continues to tolerate his treatment with ipilimumab and nivolumab fairly well.  The patient is here today for evaluation before starting day #22 of cycle #5.   MEDICAL HISTORY: Past Medical History:  Diagnosis Date  . ALCOHOL ABUSE 11/12/2009   Qualifier: Diagnosis of  By:  Hassell Done FNP, Tori Milks    . Allergy   . Anemia   . Anxiety   . BENIGN PROSTATIC HYPERTROPHY, HX OF 12/19/2006   Qualifier: Diagnosis of  By: Radene Ou MD, [place]    . Blood transfusion without reported diagnosis 2017  . Clotting disorder ([place])    takes PLAVIX  . Depression   . Emphysema of lung ([place])   . Full code status 02/10/2015  . GERD (gastroesophageal reflux disease)   . Hyperlipidemia   . Lung mass 01/25/2015   3.7 x 3.6 cm RUL spiculated lung mass with 2.0 cm right paratracheal lymph node suspicious for bronchogenic CA  . Non-small cell lung cancer ([place]) 01/27/15   non small-cell,squamous cell ca RUL  . Paranoid schizophrenia ([place]) 10/31/2009   Qualifier: Diagnosis of  By: Jorene Minors, [place]    . Primary spontaneous pneumothorax, left 01/25/2015  . TOBACCO ABUSE 05/24/2009   Qualifier: Diagnosis of  By: Hassell Done FNP, [place]      ALLERGIES:  is allergic to benztropine mesylate and chlorpromazine hcl.  MEDICATIONS:  Current Outpatient Medications  Medication Sig Dispense Refill  . ALPHAGAN P 0.1 % SOLN Place 1 drop into both eyes 3 (three) times daily.  6  . aspirin EC 81 MG tablet Take 81 mg by mouth 2 (two) times daily.    . benztropine (COGENTIN) 0.5 MG tablet Take 0.5 mg by mouth at bedtime.     . chlorhexidine (PERIDEX) 0.12 % solution 1 mL by Mouth Rinse route as needed.   1  . clopidogrel (PLAVIX) 75 MG tablet Take 75 mg by  mouth daily.     . ferrous fumarate (HEMOCYTE - 106 MG FE) 325 (106 FE) MG TABS tablet Take 1 tablet by mouth daily.     . haloperidol (HALDOL) 5 MG tablet Take 5 mg by mouth at bedtime.     . Multiple Vitamins-Minerals (MULTIVITAMIN WITH MINERALS) tablet Take 1 tablet by mouth daily.    . NONFORMULARY OR COMPOUNDED [place]  Combination Pain Cream -  Baclofen 2%, Doxepin 5%, Gabapentin 6%, Topiramate 2%, Pentoxifylline 3% Apply 1-2 grams to affected area 3-4 times daily Qty. 120 gm 3 refills 1 each 3  . ondansetron (ZOFRAN) 8 MG  tablet Take 1 tablet (8 mg total) by mouth every 8 (eight) hours as needed for nausea or vomiting. (Patient not taking: Reported on 06/25/2019) 20 tablet 0  . simvastatin (ZOCOR) 20 MG tablet Take 20 mg by mouth at bedtime.     Marland Kitchen umeclidinium-vilanterol (ANORO ELLIPTA) 62.5-25 MCG/INH AEPB Inhale 1 puff into the lungs daily. 1 each 11   Current Facility-Administered Medications  Medication Dose Route Frequency Provider Last Rate Last Admin  . 0.9 %  sodium chloride infusion  500 mL Intravenous Continuous Irene Shipper, MD      . 0.9 %  sodium chloride infusion  500 mL Intravenous Continuous Irene Shipper, MD      . 0.9 %  sodium chloride infusion  500 mL Intravenous Continuous Irene Shipper, MD        SURGICAL HISTORY:  Past Surgical History:  Procedure Laterality Date  . APPENDECTOMY    . CHEST TUBE INSERTION Left   . COLONOSCOPY    . INSERTION CENTRAL VENOUS ACCESS DEVICE W/ SUBCUTANEOUS PORT Right   . LUNG BIOPSY Right 01/27/2015   Procedure: Right Upper Lobe Bronchus BIOPSY;  Surgeon: Grace Isaac, MD;  Location: [facility];  Service: Thoracic;  Laterality: Right;  Marland Kitchen VIDEO BRONCHOSCOPY WITH ENDOBRONCHIAL ULTRASOUND N/A 01/27/2015   Procedure: VIDEO BRONCHOSCOPY WITH ENDOBRONCHIAL ULTRASOUND;  Surgeon: Grace Isaac, MD;  Location: [facility];  Service: Thoracic;  Laterality: N/A;    REVIEW OF SYSTEMS:  A comprehensive review of systems was negative.   PHYSICAL EXAMINATION: General appearance: alert, cooperative and no distress Head: Normocephalic, without obvious abnormality, atraumatic Neck: no adenopathy, no JVD, supple, symmetrical, trachea midline and thyroid not enlarged, symmetric, no tenderness/mass/nodules Lymph nodes: Cervical, supraclavicular, and axillary nodes normal. Resp: clear to auscultation bilaterally Back: symmetric, no curvature. ROM normal. No CVA tenderness. Cardio: regular rate and rhythm, S1, S2 normal, no murmur, click, rub or gallop GI: soft, non-tender;  bowel sounds normal; no masses,  no organomegaly Extremities: extremities normal, atraumatic, no cyanosis or edema  ECOG PERFORMANCE STATUS: 1 - Symptomatic but completely ambulatory  Blood pressure (!) 149/84, pulse (!) 109, temperature 98.5 F (36.9 C), temperature source Oral, resp. rate 20, height 5\' 11"  (1.803 m), weight 152 lb (68.9 kg), SpO2 100 %.  LABORATORY DATA: Lab Results  Component Value Date   WBC 5.2 07/16/2019   HGB 12.5 (L) 07/16/2019   HCT 38.7 (L) 07/16/2019   MCV 88.4 07/16/2019   PLT 199 07/16/2019      Chemistry      Component Value Date/Time   NA 141 06/25/2019 0812   NA 140 02/08/2017 0822   K 3.7 06/25/2019 0812   K 4.5 02/08/2017 0822   CL 105 06/25/2019 0812   CO2 26 06/25/2019 0812   CO2 25 02/08/2017 0822   BUN 10 06/25/2019 9937  BUN 8.8 02/08/2017 0822   CREATININE 1.18 06/25/2019 0812   CREATININE 1.1 02/08/2017 0822      Component Value Date/Time   CALCIUM 9.0 06/25/2019 0812   CALCIUM 9.6 02/08/2017 0822   ALKPHOS 49 06/25/2019 0812   ALKPHOS 53 02/08/2017 0822   AST 19 06/25/2019 0812   AST 19 02/08/2017 0822   ALT 12 06/25/2019 0812   ALT 9 02/08/2017 0822   BILITOT <0.2 (L) 06/25/2019 0812   BILITOT <0.22 02/08/2017 5916       RADIOGRAPHIC STUDIES: CT Chest W Contrast  Result Date: 06/23/2019 CLINICAL DATA:  74 year old male with history of non-small cell lung cancer. Chemotherapy in progress. Radiation therapy complete. Restaging examination. EXAM: CT CHEST WITH CONTRAST TECHNIQUE: Multidetector CT imaging of the chest was performed during intravenous contrast administration. CONTRAST:  49mL OMNIPAQUE IOHEXOL 300 MG/ML  SOLN COMPARISON:  Chest CT 03/10/2019. FINDINGS: Cardiovascular: Heart size is normal. There is no significant pericardial fluid, thickening or pericardial calcification. There is aortic atherosclerosis, as well as atherosclerosis of the great vessels of the mediastinum and the coronary arteries, including  calcified atherosclerotic plaque in the left main, left anterior descending and right coronary arteries. Right internal jugular single-lumen porta cath with tip terminating in the right atrium. Mediastinum/Nodes: No pathologically enlarged mediastinal or hilar lymph nodes. Esophagus is unremarkable in appearance. No axillary lymphadenopathy. Lungs/Pleura: The mass-like area in the right upper lobe which corresponds to the area of hypermetabolism on prior PET-CT 12/25/2018 currently measures approximately 5.3 x 2.6 x 3.6 cm (axial image 61 of series 2), previously 4.1 x 2.7 x 3.6 cm on chest CT 03/10/2019. There is again chronic volume loss in adjacent portions of the right upper lobe and superior segment of the right lower lobe where there is also some traction bronchiectasis, most compatible with an area of chronic postradiation mass-like fibrosis. Previously noted small ground-glass attenuation nodule in the left upper lobe continues to decrease in size, currently measuring only 2 mm (axial image 87 of series 7). Previously noted nodularity in the right lower lobe has nearly completely resolved on today's examination (axial image 118 of series 7), indicative of a benign infectious or inflammatory process on the prior study. No new suspicious appearing pulmonary nodules or masses are noted. No acute consolidative airspace disease. Trace right-sided pleural effusion loculated posteriorly, stable compared to the prior study. No left-sided pleural effusion. Paraseptal emphysema noted in the upper thorax bilaterally. Upper Abdomen: Multiple subcentimeter low-attenuation lesions in the liver, too small to characterize, but similar to prior studies and statistically likely to represent small cysts or biliary hamartomas. Aortic atherosclerosis. Musculoskeletal: There are no aggressive appearing lytic or blastic lesions noted in the visualized portions of the skeleton. IMPRESSION: 1. Today's study demonstrates progressive  enlargement of the previously noted hypermetabolic lesion in the right upper lobe, concerning for progression of disease. 2. All other previously noted pulmonary nodules have regressed, suggesting benign infectious/inflammatory nodules on prior studies. 3. Aortic atherosclerosis, in addition to left main and 2 vessel coronary artery disease. Assessment for potential risk factor modification, dietary therapy or pharmacologic therapy may be warranted, if clinically indicated. 4. Paraseptal emphysema. 5. Additional incidental findings, as above. Aortic Atherosclerosis (ICD10-I70.0) and Emphysema (ICD10-J43.9). Electronically Signed   By: Vinnie Langton M.D.   On: 06/23/2019 08:52    ASSESSMENT AND PLAN:  This is a very pleasant 74 years old African-American male with recurrent non-small cell lung cancer initially diagnosed as stage IIIA non-small cell lung cancer, squamous cell  carcinoma status post concurrent chemoradiation followed by consolidation chemotherapy. The patient has been in observation for close to 3 years. He had evidence for disease recurrence on the recent imaging studies including CT scan of the chest as well as PET scan. The patient is started  treatment with systemic chemotherapy with carboplatin for AUC of 5, paclitaxel 175 mg/M2 as well as combination of immunotherapy with nivolumab 360 mg IV every 3 weeks and ipilimumab 1 mg/KG every 6 weeks.  The chemotherapy will be given only for 2 cycles and then the patient will be maintained on immunotherapy with nivolumab and ipilimumab for a total of 2 years if he has no evidence for disease progression or unacceptable toxicity.  He is status post 4 cycles of treatment with systemic chemotherapy as well as immunotherapy with ipilimumab and nivolumab. Starting from cycle #3 the patient will be on maintenance treatment with ipilimumab 1 mg/KG every 6 weeks in addition to nivolumab 360 MG IV every 3 weeks. The patient continues to tolerate this  treatment well with no concerning adverse effects. I recommended for him to proceed with day #22 of cycle #5 today as planned. He will come back for follow-up visit in 3 weeks for evaluation before starting day 1 of cycle #6. The patient was advised to call immediately if he has any concerning symptoms in the interval. The patient and his sister are in agreement with the current plan. He was advised to call immediately if he has any concerning symptoms in the interval.  Disclaimer: This note was dictated with voice recognition software. Similar sounding words can inadvertently be transcribed and may not be corrected upon review.

## 2019-07-16 NOTE — Progress Notes (Signed)
This patient returns to my office for at risk foot care.  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  Patient is taking plavix.  This patient is unable to cut his  nails  since the patient cannot reach his  nails.These nails are painful walking and wearing shoes.  This patient presents for at risk foot care today.  General Appearance  Alert, conversant and in no acute stress.  Vascular  Dorsalis pedis and posterior tibial  pulses are  Weakly  palpable  bilaterally.  Capillary return is within normal limits  bilaterally. Temperature is within normal limits  bilaterally.  Neurologic  Senn-Weinstein monofilament wire test within normal limits  bilaterally. Muscle power within normal limits bilaterally.  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  Prominent metatarsals heads both feet.  Bony pathology medially aspect foot  B/L.  Orthopedic  No limitations of motion  feet .  No crepitus or effusions noted.  No bony pathology or digital deformities noted.   Skin  normotropic skin. Callus sub 2 right foot.  Callus amputation site left foot.  No signs of infections or ulcers noted.      Porokeratosis/Callus  B/L.  Consent was obtained for treatment procedures.   Debridement of callus using # 15 blade.  Patient has bony prominence medial aspect rearfoot.   Return office visit   10 weeks        Told patient to return for periodic foot care and evaluation due to potential at risk complications.   Gardiner Barefoot DPM

## 2019-07-16 NOTE — Progress Notes (Signed)
Patient ok to treat with HR 103 per Dr. Julien Nordmann

## 2019-07-17 ENCOUNTER — Telehealth: Payer: Self-pay | Admitting: Internal Medicine

## 2019-07-17 NOTE — Telephone Encounter (Signed)
Scheduled per 3/10 los. Called and left msg. Mailed printout

## 2019-07-22 DIAGNOSIS — F209 Schizophrenia, unspecified: Secondary | ICD-10-CM | POA: Diagnosis not present

## 2019-08-01 NOTE — Progress Notes (Signed)
Pharmacist Chemotherapy Monitoring - Follow Up Assessment    I verify that I have reviewed each item in the below checklist:  . Regimen for the patient is scheduled for the appropriate day and plan matches scheduled date. Marland Kitchen Appropriate non-routine labs are ordered dependent on drug ordered. . If applicable, additional medications reviewed and ordered per protocol based on lifetime cumulative doses and/or treatment regimen.   Plan for follow-up and/or issues identified: No . I-vent associated with next due treatment: No . MD and/or nursing notified: No  Philomena Course 08/01/2019 4:32 PM

## 2019-08-07 ENCOUNTER — Inpatient Hospital Stay: Payer: Medicare Other

## 2019-08-07 ENCOUNTER — Other Ambulatory Visit: Payer: Self-pay

## 2019-08-07 ENCOUNTER — Encounter: Payer: Self-pay | Admitting: Internal Medicine

## 2019-08-07 ENCOUNTER — Inpatient Hospital Stay: Payer: Medicare Other | Attending: Internal Medicine | Admitting: Internal Medicine

## 2019-08-07 VITALS — BP 105/62 | HR 106 | Temp 98.0°F | Resp 20 | Ht 71.0 in | Wt 153.0 lb

## 2019-08-07 VITALS — HR 96

## 2019-08-07 DIAGNOSIS — C3411 Malignant neoplasm of upper lobe, right bronchus or lung: Secondary | ICD-10-CM | POA: Diagnosis not present

## 2019-08-07 DIAGNOSIS — Z79899 Other long term (current) drug therapy: Secondary | ICD-10-CM | POA: Insufficient documentation

## 2019-08-07 DIAGNOSIS — Z5112 Encounter for antineoplastic immunotherapy: Secondary | ICD-10-CM | POA: Diagnosis not present

## 2019-08-07 DIAGNOSIS — J449 Chronic obstructive pulmonary disease, unspecified: Secondary | ICD-10-CM

## 2019-08-07 DIAGNOSIS — C3491 Malignant neoplasm of unspecified part of right bronchus or lung: Secondary | ICD-10-CM

## 2019-08-07 DIAGNOSIS — R5382 Chronic fatigue, unspecified: Secondary | ICD-10-CM

## 2019-08-07 LAB — CMP (CANCER CENTER ONLY)
ALT: 9 U/L (ref 0–44)
AST: 19 U/L (ref 15–41)
Albumin: 3.6 g/dL (ref 3.5–5.0)
Alkaline Phosphatase: 50 U/L (ref 38–126)
Anion gap: 10 (ref 5–15)
BUN: 9 mg/dL (ref 8–23)
CO2: 27 mmol/L (ref 22–32)
Calcium: 9.3 mg/dL (ref 8.9–10.3)
Chloride: 103 mmol/L (ref 98–111)
Creatinine: 1.14 mg/dL (ref 0.61–1.24)
GFR, Est AFR Am: >60 mL/min (ref >60)
GFR, Estimated: >60 mL/min (ref >60)
Glucose, Bld: 114 mg/dL — ABNORMAL HIGH (ref 70–99)
Potassium: 3.8 mmol/L (ref 3.5–5.1)
Sodium: 140 mmol/L (ref 135–145)
Total Bilirubin: 0.2 mg/dL — ABNORMAL LOW (ref 0.3–1.2)
Total Protein: 7.6 g/dL (ref 6.5–8.1)

## 2019-08-07 LAB — CBC WITH DIFFERENTIAL (CANCER CENTER ONLY)
Abs Immature Granulocytes: 0.01 10*3/uL (ref 0.00–0.07)
Basophils Absolute: 0 10*3/uL (ref 0.0–0.1)
Basophils Relative: 0 %
Eosinophils Absolute: 0 10*3/uL (ref 0.0–0.5)
Eosinophils Relative: 1 %
HCT: 37.5 % — ABNORMAL LOW (ref 39.0–52.0)
Hemoglobin: 12 g/dL — ABNORMAL LOW (ref 13.0–17.0)
Immature Granulocytes: 0 %
Lymphocytes Relative: 23 %
Lymphs Abs: 1.3 10*3/uL (ref 0.7–4.0)
MCH: 28.2 pg (ref 26.0–34.0)
MCHC: 32 g/dL (ref 30.0–36.0)
MCV: 88 fL (ref 80.0–100.0)
Monocytes Absolute: 0.5 10*3/uL (ref 0.1–1.0)
Monocytes Relative: 9 %
Neutro Abs: 3.6 10*3/uL (ref 1.7–7.7)
Neutrophils Relative %: 67 %
Platelet Count: 205 10*3/uL (ref 150–400)
RBC: 4.26 MIL/uL (ref 4.22–5.81)
RDW: 16.5 % — ABNORMAL HIGH (ref 11.5–15.5)
WBC Count: 5.4 10*3/uL (ref 4.0–10.5)
nRBC: 0 % (ref 0.0–0.2)

## 2019-08-07 LAB — TSH: TSH: 3.189 u[IU]/mL (ref 0.320–4.118)

## 2019-08-07 MED ORDER — HEPARIN SOD (PORK) LOCK FLUSH 100 UNIT/ML IV SOLN
500.0000 [IU] | Freq: Once | INTRAVENOUS | Status: AC | PRN
  Administered 2019-08-07: 500 [IU]
  Filled 2019-08-07: qty 5

## 2019-08-07 MED ORDER — SODIUM CHLORIDE 0.9 % IV SOLN
Freq: Once | INTRAVENOUS | Status: AC
  Filled 2019-08-07: qty 250

## 2019-08-07 MED ORDER — FAMOTIDINE IN NACL 20-0.9 MG/50ML-% IV SOLN
20.0000 mg | Freq: Once | INTRAVENOUS | Status: AC
  Administered 2019-08-07: 20 mg via INTRAVENOUS

## 2019-08-07 MED ORDER — HEPARIN SOD (PORK) LOCK FLUSH 100 UNIT/ML IV SOLN
500.0000 [IU] | Freq: Once | INTRAVENOUS | Status: DC | PRN
  Filled 2019-08-07: qty 5

## 2019-08-07 MED ORDER — SODIUM CHLORIDE 0.9% FLUSH
10.0000 mL | INTRAVENOUS | Status: DC | PRN
  Administered 2019-08-07: 10:00:00 10 mL
  Filled 2019-08-07: qty 10

## 2019-08-07 MED ORDER — SODIUM CHLORIDE 0.9 % IV SOLN
360.0000 mg | Freq: Once | INTRAVENOUS | Status: AC
  Administered 2019-08-07: 360 mg via INTRAVENOUS
  Filled 2019-08-07: qty 24

## 2019-08-07 MED ORDER — DIPHENHYDRAMINE HCL 50 MG/ML IJ SOLN
25.0000 mg | Freq: Once | INTRAMUSCULAR | Status: AC
  Administered 2019-08-07: 11:00:00 25 mg via INTRAVENOUS

## 2019-08-07 MED ORDER — DIPHENHYDRAMINE HCL 50 MG/ML IJ SOLN
INTRAMUSCULAR | Status: AC
  Filled 2019-08-07: qty 1

## 2019-08-07 MED ORDER — SODIUM CHLORIDE 0.9% FLUSH
10.0000 mL | INTRAVENOUS | Status: DC | PRN
  Administered 2019-08-07: 10 mL
  Filled 2019-08-07: qty 10

## 2019-08-07 MED ORDER — SODIUM CHLORIDE 0.9 % IV SOLN
1.0000 mg/kg | Freq: Once | INTRAVENOUS | Status: AC
  Administered 2019-08-07: 13:00:00 70 mg via INTRAVENOUS
  Filled 2019-08-07: qty 14

## 2019-08-07 MED ORDER — FAMOTIDINE IN NACL 20-0.9 MG/50ML-% IV SOLN
INTRAVENOUS | Status: AC
  Filled 2019-08-07: qty 50

## 2019-08-07 NOTE — Progress Notes (Signed)
[facility] [phone]   Fax:(336) [phone]  OFFICE PROGRESS NOTE  [name], FNP [address] 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  He has disease recurrence in August 2020.  PRIOR THERAPY:  1) Course of concurrent chemoradiation with weekly carboplatin for AUC of 2 and paclitaxel 45 MG/M2. First dose 03/01/2015. He is status post 6 cycles. Last cycle was given 04/12/2015 with partial response. 2) Consolidation chemotherapy with reduced dose carboplatin for AUC of 5 and paclitaxel 175 MG/M2 every 3 weeks, status post 3 cycles.  CURRENT THERAPY: Systemic chemotherapy with carboplatin for AUC of 5, paclitaxel 175 mg/M2 with nivolumab 360 mg IV every 3 weeks in addition to ipilimumab 1 mg/KG every 6 weeks.  First dose 01/08/2019.  Status post 5 cycles.  Starting from cycle #3 the patient will be on maintenance treatment with ipilimumab every 6 weeks and nivolumab every 3 weeks.  INTERVAL HISTORY: Ivan Daniels 74 y.o. male returns to the clinic today for follow-up visit accompanied by his sister.  The patient is feeling fine today with no concerning complaints.  He denied having any current chest pain, shortness of breath except with exertion with no cough or hemoptysis.  He denied having any fever or chills.  He has no nausea, vomiting, diarrhea or constipation.  He has no headache or visual changes.  He is here today for evaluation before starting day one of cycle #6.   MEDICAL HISTORY: Past Medical History:  Diagnosis Date  . ALCOHOL ABUSE 11/12/2009   Qualifier: Diagnosis of  By: Hassell Done FNP, Tori Milks    . Allergy   . Anemia   . Anxiety   . BENIGN  PROSTATIC HYPERTROPHY, HX OF 12/19/2006   Qualifier: Diagnosis of  By: Radene Ou MD, [place]    . Blood transfusion without reported diagnosis 2017  . Clotting disorder ([place])    takes PLAVIX  . Depression   . Emphysema of lung ([place])   . Full code status 02/10/2015  . GERD (gastroesophageal reflux disease)   . Hyperlipidemia   . Lung mass 01/25/2015   3.7 x 3.6 cm RUL spiculated lung mass with 2.0 cm right paratracheal lymph node suspicious for bronchogenic CA  . Non-small cell lung cancer ([place]) 01/27/15   non small-cell,squamous cell ca RUL  . Paranoid schizophrenia ([place]) 10/31/2009   Qualifier: Diagnosis of  By: Jorene Minors, [place]    . Primary spontaneous pneumothorax, left 01/25/2015  . TOBACCO ABUSE 05/24/2009   Qualifier: Diagnosis of  By: Hassell Done FNP, [place]      ALLERGIES:  is allergic to benztropine mesylate and chlorpromazine hcl.  MEDICATIONS:  Current Outpatient Medications  Medication Sig Dispense Refill  . ALPHAGAN P 0.1 % SOLN Place 1 drop into both eyes 3 (three) times daily.  6  . aspirin EC 81 MG tablet Take 81 mg by mouth 2 (two) times daily.    . benztropine (COGENTIN) 0.5 MG tablet Take 0.5 mg by mouth at bedtime.     . chlorhexidine (PERIDEX) 0.12 % solution 1 mL by Mouth Rinse route as needed.   1  . clopidogrel (PLAVIX) 75 MG tablet Take 75 mg by mouth daily.     . ferrous fumarate (HEMOCYTE -  106 MG FE) 325 (106 FE) MG TABS tablet Take 1 tablet by mouth daily.     . haloperidol (HALDOL) 5 MG tablet Take 5 mg by mouth at bedtime.     . Multiple Vitamins-Minerals (MULTIVITAMIN WITH MINERALS) tablet Take 1 tablet by mouth daily.    . NONFORMULARY OR COMPOUNDED [place]  Combination Pain Cream -  Baclofen 2%, Doxepin 5%, Gabapentin 6%, Topiramate 2%, Pentoxifylline 3% Apply 1-2 grams to affected area 3-4 times daily Qty. 120 gm 3 refills 1 each 3  . ondansetron (ZOFRAN) 8 MG tablet Take 1 tablet (8 mg total) by mouth every 8 (eight) hours as needed  for nausea or vomiting. (Patient not taking: Reported on 06/25/2019) 20 tablet 0  . simvastatin (ZOCOR) 20 MG tablet Take 20 mg by mouth at bedtime.     Marland Kitchen umeclidinium-vilanterol (ANORO ELLIPTA) 62.5-25 MCG/INH AEPB Inhale 1 puff into the lungs daily. 1 each 11   Current Facility-Administered Medications  Medication Dose Route Frequency Provider Last Rate Last Admin  . 0.9 %  sodium chloride infusion  500 mL Intravenous Continuous Irene Shipper, MD      . 0.9 %  sodium chloride infusion  500 mL Intravenous Continuous Irene Shipper, MD      . 0.9 %  sodium chloride infusion  500 mL Intravenous Continuous Irene Shipper, MD        SURGICAL HISTORY:  Past Surgical History:  Procedure Laterality Date  . APPENDECTOMY    . CHEST TUBE INSERTION Left   . COLONOSCOPY    . INSERTION CENTRAL VENOUS ACCESS DEVICE W/ SUBCUTANEOUS PORT Right   . LUNG BIOPSY Right 01/27/2015   Procedure: Right Upper Lobe Bronchus BIOPSY;  Surgeon: Grace Isaac, MD;  Location: [facility];  Service: Thoracic;  Laterality: Right;  Marland Kitchen VIDEO BRONCHOSCOPY WITH ENDOBRONCHIAL ULTRASOUND N/A 01/27/2015   Procedure: VIDEO BRONCHOSCOPY WITH ENDOBRONCHIAL ULTRASOUND;  Surgeon: Grace Isaac, MD;  Location: [facility];  Service: Thoracic;  Laterality: N/A;    REVIEW OF SYSTEMS:  A comprehensive review of systems was negative except for: Respiratory: positive for dyspnea on exertion   PHYSICAL EXAMINATION: General appearance: alert, cooperative and no distress Head: Normocephalic, without obvious abnormality, atraumatic Neck: no adenopathy, no JVD, supple, symmetrical, trachea midline and thyroid not enlarged, symmetric, no tenderness/mass/nodules Lymph nodes: Cervical, supraclavicular, and axillary nodes normal. Resp: clear to auscultation bilaterally Back: symmetric, no curvature. ROM normal. No CVA tenderness. Cardio: regular rate and rhythm, S1, S2 normal, no murmur, click, rub or gallop GI: soft, non-tender; bowel sounds normal;  no masses,  no organomegaly Extremities: extremities normal, atraumatic, no cyanosis or edema  ECOG PERFORMANCE STATUS: 1 - Symptomatic but completely ambulatory  Blood pressure 105/62, pulse (!) 106, temperature 98 F (36.7 C), temperature source Oral, resp. rate 20, height 5\' 11"  (1.803 m), weight 153 lb (69.4 kg), SpO2 100 %.  LABORATORY DATA: Lab Results  Component Value Date   WBC 5.4 08/07/2019   HGB 12.0 (L) 08/07/2019   HCT 37.5 (L) 08/07/2019   MCV 88.0 08/07/2019   PLT 205 08/07/2019      Chemistry      Component Value Date/Time   NA 142 07/16/2019 1101   NA 140 02/08/2017 0822   K 4.0 07/16/2019 1101   K 4.5 02/08/2017 0822   CL 105 07/16/2019 1101   CO2 28 07/16/2019 1101   CO2 25 02/08/2017 0822   BUN 10 07/16/2019 1101   BUN 8.8 02/08/2017  6553   CREATININE 1.01 07/16/2019 1101   CREATININE 1.1 02/08/2017 0822      Component Value Date/Time   CALCIUM 9.4 07/16/2019 1101   CALCIUM 9.6 02/08/2017 0822   ALKPHOS 51 07/16/2019 1101   ALKPHOS 53 02/08/2017 0822   AST 19 07/16/2019 1101   AST 19 02/08/2017 0822   ALT 9 07/16/2019 1101   ALT 9 02/08/2017 0822   BILITOT <0.2 (L) 07/16/2019 1101   BILITOT <0.22 02/08/2017 0822       RADIOGRAPHIC STUDIES: No results found.  ASSESSMENT AND PLAN:  This is a very pleasant 74 years old African-American male with recurrent non-small cell lung cancer initially diagnosed as stage IIIA non-small cell lung cancer, squamous cell carcinoma status post concurrent chemoradiation followed by consolidation chemotherapy. The patient has been in observation for close to 3 years. He had evidence for disease recurrence on the recent imaging studies including CT scan of the chest as well as PET scan. The patient is started  treatment with systemic chemotherapy with carboplatin for AUC of 5, paclitaxel 175 mg/M2 as well as combination of immunotherapy with nivolumab 360 mg IV every 3 weeks and ipilimumab 1 mg/KG every 6 weeks.   The chemotherapy will be given only for 2 cycles and then the patient will be maintained on immunotherapy with nivolumab and ipilimumab for a total of 2 years if he has no evidence for disease progression or unacceptable toxicity.  He is status post 5 cycles of treatment with systemic chemotherapy as well as immunotherapy with ipilimumab and nivolumab. Starting from cycle #3 the patient will be on maintenance treatment with ipilimumab 1 mg/KG every 6 weeks in addition to nivolumab 360 MG IV every 3 weeks.  He is tolerating his treatment well with no concerning adverse effects. I recommended for the patient to continue his current treatment and he will proceed with day one of cycle #6 today as planned. I will see the patient back for follow-up visit in 3 weeks for evaluation before starting day #22 of cycle #6.  We will have repeat imaging studies at the completion of cycle #6. The patient was advised to call immediately if he has any concerning symptoms in the interval. The patient and his sister are in agreement with the current plan. He was advised to call immediately if he has any concerning symptoms in the interval.  Disclaimer: This note was dictated with voice recognition software. Similar sounding words can inadvertently be transcribed and may not be corrected upon review.

## 2019-08-07 NOTE — Patient Instructions (Signed)
[facility] Discharge Instructions for Patients Receiving Chemotherapy  Today you received the following chemotherapy agents nivolumab and yervoy  To help prevent nausea and vomiting after your treatment, we encourage you to take your nausea medication as directed   If you develop nausea and vomiting that is not controlled by your nausea medication, call the clinic.   BELOW ARE SYMPTOMS THAT SHOULD BE REPORTED IMMEDIATELY:  *FEVER GREATER THAN 100.5 F  *CHILLS WITH OR WITHOUT FEVER  NAUSEA AND VOMITING THAT IS NOT CONTROLLED WITH YOUR NAUSEA MEDICATION  *UNUSUAL SHORTNESS OF BREATH  *UNUSUAL BRUISING OR BLEEDING  TENDERNESS IN MOUTH AND THROAT WITH OR WITHOUT PRESENCE OF ULCERS  *URINARY PROBLEMS  *BOWEL PROBLEMS  UNUSUAL RASH Items with * indicate a potential emergency and should be followed up as soon as possible.  Feel free to call the clinic should you have any questions or concerns. The clinic phone number is [phone].  Please show the CHEMO ALERT CARD at check-in to the Emergency Department and triage nurse.   

## 2019-08-07 NOTE — Progress Notes (Signed)
Pt answered No to all Yervoy pre administration question check list.

## 2019-08-15 ENCOUNTER — Ambulatory Visit: Payer: Medicare Other | Admitting: Podiatry

## 2019-08-18 DIAGNOSIS — I951 Orthostatic hypotension: Secondary | ICD-10-CM | POA: Diagnosis not present

## 2019-08-18 DIAGNOSIS — C801 Malignant (primary) neoplasm, unspecified: Secondary | ICD-10-CM | POA: Diagnosis not present

## 2019-08-24 NOTE — Progress Notes (Signed)
[facility] OFFICE PROGRESS NOTE  [name], FNP 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 He has disease recurrence in August 2020.  PRIOR THERAPY: 1) Course of concurrent chemoradiation with weekly carboplatin for AUC of 2 and paclitaxel 45 MG/M2. First dose 03/01/2015. He is status post 6 cycles. Last cycle was given 04/12/2015 with partial response. 2) Consolidation chemotherapy with reduced dose carboplatin for AUC of 5 and paclitaxel 175 MG/M2 every 3 weeks, status post 3 cycles.  CURRENT THERAPY: Systemic chemotherapy with carboplatin for AUC of 5, paclitaxel 175 mg/M2 with nivolumab 360 mg IV every 3 weeks in addition to ipilimumab 1 mg/KG every 6 weeks. First dose 01/08/2019. Status post day 1 of 6 cycles.Starting from cycle #3 the patient will be on maintenance treatment with ipilimumab every 6 weeks and nivolumab every 3 weeks  INTERVAL HISTORY: Ivan Daniels 74 y.o. male returns to the clinic for a follow up visit accompanied by his sister. The patient is feeling well today without any concerning complaints except for fatigue. The patient continues to tolerate treatment with immunotherapy well without any adverse side effects. Denies any fever, chills, night sweats, or weight loss. Denies any chest pain, shortness of breath, cough, or hemoptysis. Denies any nausea, vomiting, diarrhea, or constipation. Denies any headache or visual changes. Denies any rashes or skin changes. The patient is here today for evaluation prior to starting cycle # 6 day 22.    MEDICAL HISTORY: Past Medical History:  Diagnosis Date  . ALCOHOL ABUSE 11/12/2009   Qualifier: Diagnosis  of  By: Hassell Done FNP, Tori Milks    . Allergy   . Anemia   . Anxiety   . BENIGN PROSTATIC HYPERTROPHY, HX OF 12/19/2006   Qualifier: Diagnosis of  By: Radene Ou MD, [place]    . Blood transfusion without reported diagnosis 2017  . Clotting disorder ([place])    takes PLAVIX  . Depression   . Emphysema of lung ([place])   . Full code status 02/10/2015  . GERD (gastroesophageal reflux disease)   . Hyperlipidemia   . Lung mass 01/25/2015   3.7 x 3.6 cm RUL spiculated lung mass with 2.0 cm right paratracheal lymph node suspicious for bronchogenic CA  . Non-small cell lung cancer ([place]) 01/27/15   non small-cell,squamous cell ca RUL  . Paranoid schizophrenia ([place]) 10/31/2009   Qualifier: Diagnosis of  By: Jorene Minors, [place]    . Primary spontaneous pneumothorax, left 01/25/2015  . TOBACCO ABUSE 05/24/2009   Qualifier: Diagnosis of  By: Hassell Done FNP, [place]      ALLERGIES:  is allergic to benztropine mesylate and chlorpromazine hcl.  MEDICATIONS:  Current Outpatient Medications  Medication Sig Dispense Refill  . ALPHAGAN P 0.1 % SOLN Place 1 drop into both eyes 3 (three) times daily.  6  . aspirin EC 81 MG tablet Take 81 mg by mouth 2 (two) times daily.    . benztropine (COGENTIN) 0.5 MG tablet Take 0.5 mg by mouth at bedtime.     . chlorhexidine (PERIDEX) 0.12 % solution 1 mL by Mouth Rinse route as needed.   1  . clopidogrel (PLAVIX) 75 MG tablet Take 75 mg by mouth daily.     . ferrous fumarate (HEMOCYTE - 106  MG FE) 325 (106 FE) MG TABS tablet Take 1 tablet by mouth daily.     . haloperidol (HALDOL) 5 MG tablet Take 5 mg by mouth at bedtime.     . Multiple Vitamins-Minerals (MULTIVITAMIN WITH MINERALS) tablet Take 1 tablet by mouth daily.    . NONFORMULARY OR COMPOUNDED [place]  Combination Pain Cream -  Baclofen 2%, Doxepin 5%, Gabapentin 6%, Topiramate 2%, Pentoxifylline 3% Apply 1-2 grams to affected area 3-4 times daily Qty. 120 gm 3 refills 1 each 3  . ondansetron (ZOFRAN) 8  MG tablet Take 1 tablet (8 mg total) by mouth every 8 (eight) hours as needed for nausea or vomiting. 20 tablet 0  . simvastatin (ZOCOR) 20 MG tablet Take 20 mg by mouth at bedtime.     Marland Kitchen umeclidinium-vilanterol (ANORO ELLIPTA) 62.5-25 MCG/INH AEPB Inhale 1 puff into the lungs daily. 1 each 11   Current Facility-Administered Medications  Medication Dose Route Frequency Provider Last Rate Last Admin  . 0.9 %  sodium chloride infusion  500 mL Intravenous Continuous Irene Shipper, MD      . 0.9 %  sodium chloride infusion  500 mL Intravenous Continuous Irene Shipper, MD      . 0.9 %  sodium chloride infusion  500 mL Intravenous Continuous Irene Shipper, MD        SURGICAL HISTORY:  Past Surgical History:  Procedure Laterality Date  . APPENDECTOMY    . CHEST TUBE INSERTION Left   . COLONOSCOPY    . INSERTION CENTRAL VENOUS ACCESS DEVICE W/ SUBCUTANEOUS PORT Right   . LUNG BIOPSY Right 01/27/2015   Procedure: Right Upper Lobe Bronchus BIOPSY;  Surgeon: Grace Isaac, MD;  Location: [facility];  Service: Thoracic;  Laterality: Right;  Marland Kitchen VIDEO BRONCHOSCOPY WITH ENDOBRONCHIAL ULTRASOUND N/A 01/27/2015   Procedure: VIDEO BRONCHOSCOPY WITH ENDOBRONCHIAL ULTRASOUND;  Surgeon: Grace Isaac, MD;  Location: [facility];  Service: Thoracic;  Laterality: N/A;    REVIEW OF SYSTEMS:   Review of Systems  Constitutional: Positive for fatigue. Negative for appetite change, chills, fever and unexpected weight change.  HENT: Negative for mouth sores, nosebleeds, sore throat and trouble swallowing.   Eyes: Negative for eye problems and icterus.  Respiratory: Negative for cough, hemoptysis, shortness of breath and wheezing.  Cardiovascular: Negative for chest pain and leg swelling.  Gastrointestinal: Negative for abdominal pain, constipation, diarrhea, nausea and vomiting.  Genitourinary: Negative for bladder incontinence, difficulty urinating, dysuria, frequency and hematuria.   Musculoskeletal: Negative for  back pain, gait problem, neck pain and neck stiffness.  Skin: Negative for itching and rash.  Neurological: Negative for dizziness, extremity weakness, gait problem, headaches, light-headedness and seizures.  Hematological: Negative for adenopathy. Does not bruise/bleed easily.  Psychiatric/Behavioral: Negative for confusion, depression and sleep disturbance. The patient is not nervous/anxious.     PHYSICAL EXAMINATION:  Blood pressure 137/79, pulse (!) 107, temperature 98.7 F (37.1 C), temperature source Temporal, resp. rate 18, height 5\' 11"  (1.803 m), weight 154 lb 9.6 oz (70.1 kg), SpO2 100 %.  ECOG PERFORMANCE STATUS: 1 - Symptomatic but completely ambulatory  Physical Exam  Constitutional: Oriented to person, place, and time and well-developed, well-nourished, and in no distress.   HENT:  Head: Normocephalic and atraumatic.  Mouth/Throat: Oropharynx is clear and moist. No oropharyngeal exudate.  Eyes: Conjunctivae are normal. Right eye exhibits no discharge. Left eye exhibits no discharge. No scleral icterus.  Neck: Normal range of motion. Neck supple.  Cardiovascular: Normal  rate, regular rhythm, normal heart sounds and intact distal pulses.   Pulmonary/Chest: Effort normal and breath sounds normal. No respiratory distress. No wheezes. No rales.  Abdominal: Soft. Bowel sounds are normal. Exhibits no distension and no mass. There is no tenderness.  Musculoskeletal: Normal range of motion. Exhibits no edema.  Lymphadenopathy:    No cervical adenopathy.  Neurological: Alert and oriented to person, place, and time. Exhibits normal muscle tone. Gait normal. Coordination normal.  Skin: Skin is warm and dry. No rash noted. Not diaphoretic. No erythema. No pallor.  Psychiatric: Mood, memory and judgment normal.  Vitals reviewed.  LABORATORY DATA: Lab Results  Component Value Date   WBC 5.6 08/27/2019   HGB 12.2 (L) 08/27/2019   HCT 37.6 (L) 08/27/2019   MCV 87.6 08/27/2019   PLT  205 08/27/2019      Chemistry      Component Value Date/Time   NA 142 08/27/2019 1015   NA 140 02/08/2017 0822   K 4.0 08/27/2019 1015   K 4.5 02/08/2017 0822   CL 105 08/27/2019 1015   CO2 27 08/27/2019 1015   CO2 25 02/08/2017 0822   BUN 11 08/27/2019 1015   BUN 8.8 02/08/2017 0822   CREATININE 1.11 08/27/2019 1015   CREATININE 1.1 02/08/2017 0822      Component Value Date/Time   CALCIUM 9.5 08/27/2019 1015   CALCIUM 9.6 02/08/2017 0822   ALKPHOS 49 08/27/2019 1015   ALKPHOS 53 02/08/2017 0822   AST 20 08/27/2019 1015   AST 19 02/08/2017 0822   ALT 10 08/27/2019 1015   ALT 9 02/08/2017 0822   BILITOT 0.2 (L) 08/27/2019 1015   BILITOT <0.22 02/08/2017 5027       RADIOGRAPHIC STUDIES:  No results found.   ASSESSMENT/PLAN:  This isa very pleasant 74 year old African-American maleinitiallydiagnosed with stage IIIa non-small cell lung cancer, squamous cell carcinoma. He was diagnosed in April 2016.He presented with a right upper lobe lung mass and right hilar and mediastinal lymphadenopathy.   He isstatuspost concurrent chemoradiation followed by consolidation chemotherapy.He has beenon observation for over 3years.The patient showed evidence of disease recurrence in August 2020  He is currently undergoing treatment with carboplatin for an AUC of 5, paclitaxel 175 mg/m in combination with immunotherapy with nivolumab 360 mg IV every 3 weeks andipilimumab1 mg/kg IV every 6 weeks. The patient is status post cycle #6 day 1. Starting from cycle #2,patient will be on maintenance treatment with immunotherapy with nivolumab 360 mg IV every 3 weeks andipilimumab1 mg/kg IV every 6 weeks for 2 yearsas long as the patient has no evidence of disease progression or unacceptable toxicity.   The patient was seen with Dr. Julien Nordmann today. Labs were reviewed. Recommend that he proceed with day 22 of cycle 6 today.   We will see him back for a follow up visit in 3  weeks for evaluation before starting cycle #7.   I will arrange for a restaging CT scan prior to his next cycle of treatment.   The patient was advised to call immediately if he has any concerning symptoms in the interval. The patient voices understanding of current disease status and treatment options and is in agreement with the current care plan. All questions were answered. The patient knows to call the clinic with any problems, questions or concerns. We can certainly see the patient much sooner if necessary   Orders Placed This Encounter  Procedures  . CT Chest W Contrast    Standing Status:  Future    Standing Expiration Date:   08/26/2020    Order Specific Question:   ** REASON FOR EXAM (FREE TEXT)    Answer:   Restaging Lung Cancer    Order Specific Question:   If indicated for the ordered procedure, I authorize the administration of contrast media per Radiology protocol    Answer:   Yes    Order Specific Question:   Preferred imaging location?    Answer:   [facility]    Order Specific Question:   Radiology Contrast Protocol - do NOT remove file path    Answer:   [email or web address]  . TSH    Standing Status:   Standing    Number of Occurrences:   9    Standing Expiration Date:   08/26/2020     Tobe Sos Kristinia Leavy, PA-C 08/27/19  ADDENDUM: Hematology/Oncology Attending: I had a face-to-face encounter with the patient today.  I recommended his care plan.  This is a very pleasant 74 years old African-American male with recurrent non-small cell lung cancer, squamous cell carcinoma.  He started on systemic chemotherapy in combination with immunotherapy with carboplatin, paclitaxel as well as ipilimumab and nivolumab for 1 cycle.  He is currently undergoing maintenance treatment with a combination of ipilimumab and nivolumab status post 4 more cycles and he is currently undergoing cycle #6.  The patient has been tolerating his treatment well  with no concerning adverse effects. I recommended for him to proceed with day #22 of cycle #6 today as planned. We will see the patient back for follow-up visit in 3 weeks for evaluation with repeat CT scan of the chest, abdomen pelvis for restaging of his disease. The patient was advised to call immediately if he has any concerning symptoms in the interval.  Disclaimer: This note was dictated with voice recognition software. Similar sounding words can inadvertently be transcribed and may be missed upon review. Eilleen Kempf, MD 08/27/19

## 2019-08-27 ENCOUNTER — Inpatient Hospital Stay: Payer: Medicare Other

## 2019-08-27 ENCOUNTER — Inpatient Hospital Stay (HOSPITAL_BASED_OUTPATIENT_CLINIC_OR_DEPARTMENT_OTHER): Payer: Medicare Other | Admitting: Physician Assistant

## 2019-08-27 ENCOUNTER — Other Ambulatory Visit: Payer: Self-pay

## 2019-08-27 ENCOUNTER — Encounter: Payer: Self-pay | Admitting: Physician Assistant

## 2019-08-27 VITALS — BP 137/79 | HR 107 | Temp 98.7°F | Resp 18 | Ht 71.0 in | Wt 154.6 lb

## 2019-08-27 VITALS — HR 98

## 2019-08-27 DIAGNOSIS — C3411 Malignant neoplasm of upper lobe, right bronchus or lung: Secondary | ICD-10-CM

## 2019-08-27 DIAGNOSIS — R5383 Other fatigue: Secondary | ICD-10-CM

## 2019-08-27 DIAGNOSIS — J449 Chronic obstructive pulmonary disease, unspecified: Secondary | ICD-10-CM

## 2019-08-27 DIAGNOSIS — C3491 Malignant neoplasm of unspecified part of right bronchus or lung: Secondary | ICD-10-CM

## 2019-08-27 DIAGNOSIS — Z79899 Other long term (current) drug therapy: Secondary | ICD-10-CM | POA: Diagnosis not present

## 2019-08-27 DIAGNOSIS — Z5112 Encounter for antineoplastic immunotherapy: Secondary | ICD-10-CM | POA: Diagnosis not present

## 2019-08-27 DIAGNOSIS — R5382 Chronic fatigue, unspecified: Secondary | ICD-10-CM

## 2019-08-27 LAB — CBC WITH DIFFERENTIAL (CANCER CENTER ONLY)
Abs Immature Granulocytes: 0.02 10*3/uL (ref 0.00–0.07)
Basophils Absolute: 0 10*3/uL (ref 0.0–0.1)
Basophils Relative: 0 %
Eosinophils Absolute: 0 10*3/uL (ref 0.0–0.5)
Eosinophils Relative: 1 %
HCT: 37.6 % — ABNORMAL LOW (ref 39.0–52.0)
Hemoglobin: 12.2 g/dL — ABNORMAL LOW (ref 13.0–17.0)
Immature Granulocytes: 0 %
Lymphocytes Relative: 26 %
Lymphs Abs: 1.5 10*3/uL (ref 0.7–4.0)
MCH: 28.4 pg (ref 26.0–34.0)
MCHC: 32.4 g/dL (ref 30.0–36.0)
MCV: 87.6 fL (ref 80.0–100.0)
Monocytes Absolute: 0.6 10*3/uL (ref 0.1–1.0)
Monocytes Relative: 11 %
Neutro Abs: 3.4 10*3/uL (ref 1.7–7.7)
Neutrophils Relative %: 62 %
Platelet Count: 205 10*3/uL (ref 150–400)
RBC: 4.29 MIL/uL (ref 4.22–5.81)
RDW: 16.7 % — ABNORMAL HIGH (ref 11.5–15.5)
WBC Count: 5.6 10*3/uL (ref 4.0–10.5)
nRBC: 0 % (ref 0.0–0.2)

## 2019-08-27 LAB — TSH: TSH: 3.985 u[IU]/mL (ref 0.320–4.118)

## 2019-08-27 LAB — CMP (CANCER CENTER ONLY)
ALT: 10 U/L (ref 0–44)
AST: 20 U/L (ref 15–41)
Albumin: 3.7 g/dL (ref 3.5–5.0)
Alkaline Phosphatase: 49 U/L (ref 38–126)
Anion gap: 10 (ref 5–15)
BUN: 11 mg/dL (ref 8–23)
CO2: 27 mmol/L (ref 22–32)
Calcium: 9.5 mg/dL (ref 8.9–10.3)
Chloride: 105 mmol/L (ref 98–111)
Creatinine: 1.11 mg/dL (ref 0.61–1.24)
GFR, Est AFR Am: >60 mL/min (ref >60)
GFR, Estimated: >60 mL/min (ref >60)
Glucose, Bld: 103 mg/dL — ABNORMAL HIGH (ref 70–99)
Potassium: 4 mmol/L (ref 3.5–5.1)
Sodium: 142 mmol/L (ref 135–145)
Total Bilirubin: 0.2 mg/dL — ABNORMAL LOW (ref 0.3–1.2)
Total Protein: 7.9 g/dL (ref 6.5–8.1)

## 2019-08-27 MED ORDER — SODIUM CHLORIDE 0.9 % IV SOLN
Freq: Once | INTRAVENOUS | Status: AC
  Filled 2019-08-27: qty 250

## 2019-08-27 MED ORDER — SODIUM CHLORIDE 0.9% FLUSH
10.0000 mL | INTRAVENOUS | Status: DC | PRN
  Administered 2019-08-27: 10 mL
  Filled 2019-08-27: qty 10

## 2019-08-27 MED ORDER — HEPARIN SOD (PORK) LOCK FLUSH 100 UNIT/ML IV SOLN
500.0000 [IU] | Freq: Once | INTRAVENOUS | Status: AC | PRN
  Administered 2019-08-27: 14:00:00 500 [IU]
  Filled 2019-08-27: qty 5

## 2019-08-27 MED ORDER — SODIUM CHLORIDE 0.9 % IV SOLN
360.0000 mg | Freq: Once | INTRAVENOUS | Status: AC
  Administered 2019-08-27: 360 mg via INTRAVENOUS
  Filled 2019-08-27: qty 24

## 2019-08-27 NOTE — Patient Instructions (Signed)
[facility] Discharge Instructions for Patients Receiving Chemotherapy  Today you received the following chemotherapy agents: nivolumab.  To help prevent nausea and vomiting after your treatment, we encourage you to take your nausea medication as directed.   If you develop nausea and vomiting that is not controlled by your nausea medication, call the clinic.   BELOW ARE SYMPTOMS THAT SHOULD BE REPORTED IMMEDIATELY:  *FEVER GREATER THAN 100.5 F  *CHILLS WITH OR WITHOUT FEVER  NAUSEA AND VOMITING THAT IS NOT CONTROLLED WITH YOUR NAUSEA MEDICATION  *UNUSUAL SHORTNESS OF BREATH  *UNUSUAL BRUISING OR BLEEDING  TENDERNESS IN MOUTH AND THROAT WITH OR WITHOUT PRESENCE OF ULCERS  *URINARY PROBLEMS  *BOWEL PROBLEMS  UNUSUAL RASH Items with * indicate a potential emergency and should be followed up as soon as possible.  Feel free to call the clinic should you have any questions or concerns. The clinic phone number is [phone].  Please show the CHEMO ALERT CARD at check-in to the Emergency Department and triage nurse.   

## 2019-08-28 ENCOUNTER — Telehealth: Payer: Self-pay | Admitting: Internal Medicine

## 2019-08-28 NOTE — Telephone Encounter (Signed)
Scheduled per los. Called and left msg. Mailed printout  °

## 2019-09-11 NOTE — Progress Notes (Signed)
Pharmacist Chemotherapy Monitoring - Follow Up Assessment    I verify that I have reviewed each item in the below checklist:  . Regimen for the patient is scheduled for the appropriate day and plan matches scheduled date. Marland Kitchen Appropriate non-routine labs are ordered dependent on drug ordered. . If applicable, additional medications reviewed and ordered per protocol based on lifetime cumulative doses and/or treatment regimen.   Plan for follow-up and/or issues identified: No . I-vent associated with next due treatment: No . MD and/or nursing notified: No  Delane Ginger 09/11/2019 9:47 AM

## 2019-09-15 ENCOUNTER — Other Ambulatory Visit: Payer: Self-pay

## 2019-09-15 ENCOUNTER — Ambulatory Visit (HOSPITAL_COMMUNITY)
Admission: RE | Admit: 2019-09-15 | Discharge: 2019-09-15 | Disposition: A | Discharge status: Home / Self Care | Payer: Medicare Other | Source: Ambulatory Visit | Attending: Physician Assistant | Admitting: Physician Assistant

## 2019-09-15 ENCOUNTER — Encounter (HOSPITAL_COMMUNITY): Payer: Self-pay

## 2019-09-15 DIAGNOSIS — Z85118 Personal history of other malignant neoplasm of bronchus and lung: Secondary | ICD-10-CM | POA: Diagnosis not present

## 2019-09-15 DIAGNOSIS — C3491 Malignant neoplasm of unspecified part of right bronchus or lung: Secondary | ICD-10-CM | POA: Diagnosis not present

## 2019-09-15 MED ORDER — IOHEXOL 300 MG/ML  SOLN
75.0000 mL | Freq: Once | INTRAMUSCULAR | Status: AC | PRN
  Administered 2019-09-15: 75 mL via INTRAVENOUS

## 2019-09-15 MED ORDER — SODIUM CHLORIDE (PF) 0.9 % IJ SOLN
INTRAMUSCULAR | Status: AC
  Filled 2019-09-15: qty 50

## 2019-09-15 MED ORDER — HEPARIN SOD (PORK) LOCK FLUSH 100 UNIT/ML IV SOLN
INTRAVENOUS | Status: AC
  Filled 2019-09-15: qty 5

## 2019-09-15 MED ORDER — HEPARIN SOD (PORK) LOCK FLUSH 100 UNIT/ML IV SOLN
500.0000 [IU] | Freq: Once | INTRAVENOUS | Status: AC
  Administered 2019-09-15: 500 [IU] via INTRAVENOUS

## 2019-09-16 NOTE — Progress Notes (Signed)
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 He has disease recurrence in August 2020.  PRIOR THERAPY: 1) Course of concurrent chemoradiation with weekly carboplatin for AUC of 2 and paclitaxel 45 MG/M2. First dose 03/01/2015. He is status post 6 cycles. Last cycle was given 04/12/2015 with partial response. 2) Consolidation chemotherapy with reduced dose carboplatin for AUC of 5 and paclitaxel 175 MG/M2 every 3 weeks, status post 3 cycles.  CURRENT THERAPY: Systemic chemotherapy with carboplatin for AUC of 5, paclitaxel 175 mg/M2 with nivolumab 360 mg IV every 3 weeks in addition to ipilimumab 1 mg/KG every 6 weeks. First dose 01/08/2019. Status postday 22 of 6cycles.Starting from cycle #3 the patient will be on maintenance treatment with ipilimumab every 6 weeks and nivolumab every 3 weeks  INTERVAL HISTORY: Ivan Daniels 74 y.o. male returns to the clinic for a follow up visit accompanied by his sister. The patient is feeling well today without any concerning complaints. The patient continues to tolerate treatment with immunotherapy well without any adverse side effects. He states his cough is "pretty much gone". His sister notes chest congestion and his prior dry cough for which she would give him robitussin. He denies any associated nasal congestion, sore throat, fevers, chills, night sweats, chest pain, shortness of breath, or nausea/vomiting.  Denies any nausea, vomiting, diarrhea, or constipation. Denies any headache or visual changes. Denies any rashes or skin changes.  The patient recently had a restaging CT scan  performed. The patient is here today for evaluation and to review his scan results prior to starting cycle # 7 day 1.   MEDICAL HISTORY: Past Medical History:  Diagnosis Date  . ALCOHOL ABUSE 11/12/2009   Qualifier: Diagnosis of  By: Hassell Done FNP, Tori Milks    . Allergy   . Anemia   . Anxiety   . BENIGN PROSTATIC HYPERTROPHY, HX OF 12/19/2006   Qualifier: Diagnosis of  By: Radene Ou MD, [place]    . Blood transfusion without reported diagnosis 2017  . Clotting disorder ([place])    takes PLAVIX  . Depression   . Emphysema of lung ([place])   . Full code status 02/10/2015  . GERD (gastroesophageal reflux disease)   . Hyperlipidemia   . Lung mass 01/25/2015   3.7 x 3.6 cm RUL spiculated lung mass with 2.0 cm right paratracheal lymph node suspicious for bronchogenic CA  . Non-small cell lung cancer ([place]) 01/27/15   non small-cell,squamous cell ca RUL  . Paranoid schizophrenia ([place]) 10/31/2009   Qualifier: Diagnosis of  By: Jorene Minors, [place]    . Primary spontaneous pneumothorax, left 01/25/2015  . TOBACCO ABUSE 05/24/2009   Qualifier: Diagnosis of  By: Hassell Done FNP, [place]      ALLERGIES:  is allergic to benztropine mesylate and chlorpromazine hcl.  MEDICATIONS:  Current Outpatient Medications  Medication Sig Dispense Refill  . ALPHAGAN P 0.1 % SOLN Place 1 drop into both eyes 3 (three) times daily.  6  . aspirin EC 81 MG tablet Take 81 mg by mouth 2 (two) times daily.    . benztropine (COGENTIN) 0.5 MG tablet Take 0.5 mg by mouth at bedtime.     . chlorhexidine (PERIDEX) 0.12 %  solution 1 mL by Mouth Rinse route as needed.   1  . clopidogrel (PLAVIX) 75 MG tablet Take 75 mg by mouth daily.     . ferrous fumarate (HEMOCYTE - 106 MG FE) 325 (106 FE) MG TABS tablet Take 1 tablet by mouth daily.     . haloperidol (HALDOL) 5 MG tablet Take 5 mg by mouth at bedtime.     . Multiple Vitamins-Minerals (MULTIVITAMIN WITH MINERALS) tablet Take 1 tablet by mouth daily.    . NONFORMULARY OR COMPOUNDED [place]  Combination Pain Cream -  Baclofen 2%, Doxepin 5%, Gabapentin 6%, Topiramate 2%, Pentoxifylline 3% Apply 1-2 grams to affected area 3-4 times daily Qty. 120 gm 3 refills 1 each 3  . simvastatin (ZOCOR) 20 MG tablet Take 20 mg by mouth at bedtime.     Marland Kitchen umeclidinium-vilanterol (ANORO ELLIPTA) 62.5-25 MCG/INH AEPB Inhale 1 puff into the lungs daily. 1 each 11  . ondansetron (ZOFRAN) 8 MG tablet Take 1 tablet (8 mg total) by mouth every 8 (eight) hours as needed for nausea or vomiting. (Patient not taking: Reported on 09/17/2019) 20 tablet 0   Current Facility-Administered Medications  Medication Dose Route Frequency Provider Last Rate Last Admin  . 0.9 %  sodium chloride infusion  500 mL Intravenous Continuous Irene Shipper, MD      . 0.9 %  sodium chloride infusion  500 mL Intravenous Continuous Irene Shipper, MD      . 0.9 %  sodium chloride infusion  500 mL Intravenous Continuous Irene Shipper, MD        SURGICAL HISTORY:  Past Surgical History:  Procedure Laterality Date  . APPENDECTOMY    . CHEST TUBE INSERTION Left   . COLONOSCOPY    . INSERTION CENTRAL VENOUS ACCESS DEVICE W/ SUBCUTANEOUS PORT Right   . LUNG BIOPSY Right 01/27/2015   Procedure: Right Upper Lobe Bronchus BIOPSY;  Surgeon: Grace Isaac, MD;  Location: [facility];  Service: Thoracic;  Laterality: Right;  Marland Kitchen VIDEO BRONCHOSCOPY WITH ENDOBRONCHIAL ULTRASOUND N/A 01/27/2015   Procedure: VIDEO BRONCHOSCOPY WITH ENDOBRONCHIAL ULTRASOUND;  Surgeon: Grace Isaac, MD;  Location: [facility];  Service: Thoracic;  Laterality: N/A;    REVIEW OF SYSTEMS:   Review of Systems  Constitutional: Negative for appetite change, chills, fatigue, fever and unexpected weight change.  HENT:   Negative for mouth sores, nosebleeds, sore throat and trouble swallowing.   Eyes: Negative for eye problems and icterus.  Respiratory: Positive for occassional dry cough and wheezing. Negative for  Hemoptysis or shortness of  breath. Cardiovascular: Negative for chest pain and leg swelling.  Gastrointestinal: Negative for abdominal pain, constipation, diarrhea, nausea and vomiting.  Genitourinary: Negative for bladder incontinence, difficulty urinating, dysuria, frequency and hematuria.   Musculoskeletal: Negative for back pain, gait problem, neck pain and neck stiffness.  Skin: Negative for itching and rash.  Neurological: Negative for dizziness, extremity weakness, gait problem, headaches, light-headedness and seizures.  Hematological: Negative for adenopathy. Does not bruise/bleed easily.  Psychiatric/Behavioral: Negative for confusion, depression and sleep disturbance. The patient is not nervous/anxious.     PHYSICAL EXAMINATION:  Blood pressure 117/70, pulse 96, temperature 98 F (36.7 C), temperature source Temporal, resp. rate 16, height 5\' 11"  (1.803 m), weight 155 lb (70.3 kg), SpO2 100 %.  ECOG PERFORMANCE STATUS: 1 - Symptomatic but completely ambulatory  Physical Exam  Constitutional: Oriented to person, place, and time and well-developed, well-nourished, and in no distress.  HENT:  Head:  Normocephalic and atraumatic.  Mouth/Throat: Oropharynx is clear and moist. No oropharyngeal exudate.  Eyes: Conjunctivae are normal. Right eye exhibits no discharge. Left eye exhibits no discharge. No scleral icterus.  Neck: Normal range of motion. Neck supple.  Cardiovascular: Normal rate, regular rhythm, normal heart sounds and intact distal pulses.   Pulmonary/Chest: Effort normal. Positive for rhonchi. No respiratory distress. No rales.  Abdominal: Soft. Bowel sounds are normal. Exhibits no distension and no mass. There is no tenderness.  Musculoskeletal: Normal range of motion. Exhibits no edema.  Lymphadenopathy:    No cervical adenopathy.  Neurological: Alert and oriented to person, place, and time. Exhibits normal muscle tone. Gait normal. Coordination normal.  Skin: Skin is warm and dry. No rash  noted. Not diaphoretic. No erythema. No pallor.  Psychiatric: Mood, memory and judgment normal.  Vitals reviewed.  LABORATORY DATA: Lab Results  Component Value Date   WBC 5.1 09/17/2019   HGB 12.5 (L) 09/17/2019   HCT 38.9 (L) 09/17/2019   MCV 90.7 09/17/2019   PLT 256 09/17/2019      Chemistry      Component Value Date/Time   NA 141 09/17/2019 0840   NA 140 02/08/2017 0822   K 3.7 09/17/2019 0840   K 4.5 02/08/2017 0822   CL 105 09/17/2019 0840   CO2 24 09/17/2019 0840   CO2 25 02/08/2017 0822   BUN 11 09/17/2019 0840   BUN 8.8 02/08/2017 0822   CREATININE 1.13 09/17/2019 0840   CREATININE 1.1 02/08/2017 0822      Component Value Date/Time   CALCIUM 9.1 09/17/2019 0840   CALCIUM 9.6 02/08/2017 0822   ALKPHOS 53 09/17/2019 0840   ALKPHOS 53 02/08/2017 0822   AST 17 09/17/2019 0840   AST 19 02/08/2017 0822   ALT 10 09/17/2019 0840   ALT 9 02/08/2017 0822   BILITOT <0.2 (L) 09/17/2019 0840   BILITOT <0.22 02/08/2017 6144       RADIOGRAPHIC STUDIES:  CT Chest W Contrast  Result Date: 09/15/2019 CLINICAL DATA:  74 year old male with history of lung cancer. Follow-up study. EXAM: CT CHEST WITH CONTRAST TECHNIQUE: Multidetector CT imaging of the chest was performed during intravenous contrast administration. CONTRAST:  57mL OMNIPAQUE IOHEXOL 300 MG/ML  SOLN COMPARISON:  Chest CT 06/23/2019. FINDINGS: Cardiovascular: Heart size is normal. There is no significant pericardial fluid, thickening or pericardial calcification. There is aortic atherosclerosis, as well as atherosclerosis of the great vessels of the mediastinum and the coronary arteries, including calcified atherosclerotic plaque in the left main, left anterior descending and right coronary arteries. Right internal jugular single-lumen porta cath with tip terminating in the right atrium. Mediastinum/Nodes: No pathologically enlarged mediastinal or hilar lymph nodes. Esophagus is unremarkable in appearance. No  axillary lymphadenopathy. Lungs/Pleura: Chronic postradiation changes are again noted in the apex of the right lung. In the right upper lobe there is again a focal mass-like area which currently measures approximately 4.7 x 3.0 cm (axial image 51 of series 2) slightly smaller than the prior examination. However, along the inferior margin of this treated area, best appreciated on coronal image 61 of series 5 and sagittal image 53 of series 6) there are increasing areas of nodularity which are concerning for residual/recurrent disease, currently measuring approximately 2.3 x 2.9 x 1.2 cm. Chronic surrounding architectural distortion, volume loss and pleural thickening is similar to the prior study, as well as a small loculated pleural fluid collection in the posterior aspect of the upper right hemithorax. New clustered peribronchovascular micro  nodularity in the left lower lobe, favored to reflect areas of mucoid impaction. Upper Abdomen: Aortic atherosclerosis. Numerous subcentimeter low-attenuation lesions in the liver, similar to prior studies, too small to characterize, but statistically likely to represent tiny cysts. Musculoskeletal: There are no aggressive appearing lytic or blastic lesions noted in the visualized portions of the skeleton. IMPRESSION: 1. Although much of the treated area in the upper right lung appear stable or decreased compared to the prior examination, there is an area of worsening nodularity along the inferior margin of the treated area, detailed above, highly concerning for residual/recurrent disease. Further evaluation with PET-CT is suggested in the near future. 2. Probable areas of mucoid impaction in the basal segments of the left lower lobe. 3. Aortic atherosclerosis, in addition to left main and 2 vessel coronary artery disease. Assessment for potential risk factor modification, dietary therapy or pharmacologic therapy may be warranted, if clinically indicated. Aortic Atherosclerosis  (ICD10-I70.0). Electronically Signed   By: Vinnie Langton M.D.   On: 09/15/2019 10:24     ASSESSMENT/PLAN:  This isa very pleasant 74 year old African-American maleinitiallydiagnosed with stage IIIa non-small cell lung cancer, squamous cell carcinoma. He was diagnosed in April 2016.He presented with a right upper lobe lung mass and right hilar and mediastinal lymphadenopathy.   He isstatuspost concurrent chemoradiation followed by consolidation chemotherapy.He has beenon observation for over 3years.The patient showed evidence of disease recurrence in August 2020  He is currently undergoing treatment with carboplatin for an AUC of 5, paclitaxel 175 mg/m in combination with immunotherapy with nivolumab 360 mg IV every 3 weeks andipilimumab1 mg/kg IV every 6 weeks. The patient is status post cycle #6 day 22. Starting from cycle #2,patient will be on maintenance treatment with immunotherapy with nivolumab 360 mg IV every 3 weeks andipilimumab1 mg/kg IV every 6 weeks for 2 yearsas long as the patient has no evidence of disease progression or unacceptable toxicity.   The patient recently had a restaging CT scan performed.  Dr. Julien Nordmann personally and independently reviewed the scan and discussed the results with the patient today.  The scan did not show any evidence for disease progression except for an area of worsening nodularity along the inferior margin of the treated area. Dr. Julien Nordmann recommends further evaluation with a PET scan. Dr. Julien Nordmann recommends that he proceed with cycle #7 today as scheduled; however, depending on the results of his up coming PET scan, Dr. Julien Nordmann may have to change his treatment which we will discuss at his next appointment.    The patient will receive day 1 of cycle 7 today as planned.  We will see the patient back for follow-up visit in 3 weeks for evaluation before starting day 22 of cycle #7.  The patient was advised to call immediately if  he has any concerning symptoms in the interval. The patient voices understanding of current disease status and treatment options and is in agreement with the current care plan. All questions were answered. The patient knows to call the clinic with any problems, questions or concerns. We can certainly see the patient much sooner if necessary    Orders Placed This Encounter  Procedures  . NM PET Image Restag (PS) Skull Base To Thigh    Standing Status:   Future    Standing Expiration Date:   09/16/2020    Order Specific Question:   ** REASON FOR EXAM (FREE TEXT)    Answer:   To further evaluate the area of worsening nodularity along the inferior margin  on recent CT scan    Order Specific Question:   If indicated for the ordered procedure, I authorize the administration of a radiopharmaceutical per Radiology protocol    Answer:   Yes    Order Specific Question:   Radiology Contrast Protocol - do NOT remove file path    Answer:   [email or web address]     Calayah Guadarrama L Antwyne Pingree, PA-C 09/17/19  ADDENDUM: Hematology/Oncology Attending: I had a face-to-face encounter with the patient today.  I recommended his care plan.  This is a very pleasant 74 years old African-American male with recurrent non-small cell lung cancer, squamous cell carcinoma.  He started on treatment with carboplatin, paclitaxel, ipilimumab and nivolumab for 1 cycle and he is currently on treatment with maintenance ipilimumab and nivolumab 6 cycles. He has been tolerating this treatment well with no concerning complaints. He had repeat CT scan of the chest, abdomen pelvis performed recently.  I personally and independently reviewed the scan and discussed the results with the patient and his sister today. Has a scan showed a stable disease except for worsening nodularity along the inferior margin of the treated area concerning for residual/recurrent disease. I recommended for the patient to continue  his current treatment with ipilimumab 1 mg/KG every 6 weeks in addition to nivolumab 360 mg IV every 3 weeks.  We will also order a PET scan for further evaluation of the nodularity in the right lung. The patient will come back for follow-up visit in 3 weeks for evaluation before the next dose of his treatment. He was advised to call immediately if he has any concerning symptoms in the interval.  Disclaimer: This note was dictated with voice recognition software. Similar sounding words can inadvertently be transcribed and may be missed upon review. Eilleen Kempf, MD 09/17/19

## 2019-09-17 ENCOUNTER — Encounter: Payer: Self-pay | Admitting: Physician Assistant

## 2019-09-17 ENCOUNTER — Other Ambulatory Visit: Payer: Self-pay

## 2019-09-17 ENCOUNTER — Inpatient Hospital Stay: Payer: Medicare Other | Attending: Internal Medicine | Admitting: Physician Assistant

## 2019-09-17 ENCOUNTER — Inpatient Hospital Stay: Payer: Medicare Other

## 2019-09-17 VITALS — BP 117/70 | HR 96 | Temp 98.0°F | Resp 16 | Ht 71.0 in | Wt 155.0 lb

## 2019-09-17 DIAGNOSIS — Z5112 Encounter for antineoplastic immunotherapy: Secondary | ICD-10-CM

## 2019-09-17 DIAGNOSIS — C3411 Malignant neoplasm of upper lobe, right bronchus or lung: Secondary | ICD-10-CM

## 2019-09-17 DIAGNOSIS — J449 Chronic obstructive pulmonary disease, unspecified: Secondary | ICD-10-CM

## 2019-09-17 DIAGNOSIS — C3491 Malignant neoplasm of unspecified part of right bronchus or lung: Secondary | ICD-10-CM

## 2019-09-17 DIAGNOSIS — Z79899 Other long term (current) drug therapy: Secondary | ICD-10-CM | POA: Insufficient documentation

## 2019-09-17 DIAGNOSIS — R5383 Other fatigue: Secondary | ICD-10-CM

## 2019-09-17 LAB — CBC WITH DIFFERENTIAL (CANCER CENTER ONLY)
Abs Immature Granulocytes: 0.01 10*3/uL (ref 0.00–0.07)
Basophils Absolute: 0 10*3/uL (ref 0.0–0.1)
Basophils Relative: 0 %
Eosinophils Absolute: 0 10*3/uL (ref 0.0–0.5)
Eosinophils Relative: 1 %
HCT: 38.9 % — ABNORMAL LOW (ref 39.0–52.0)
Hemoglobin: 12.5 g/dL — ABNORMAL LOW (ref 13.0–17.0)
Immature Granulocytes: 0 %
Lymphocytes Relative: 25 %
Lymphs Abs: 1.3 10*3/uL (ref 0.7–4.0)
MCH: 29.1 pg (ref 26.0–34.0)
MCHC: 32.1 g/dL (ref 30.0–36.0)
MCV: 90.7 fL (ref 80.0–100.0)
Monocytes Absolute: 0.4 10*3/uL (ref 0.1–1.0)
Monocytes Relative: 8 %
Neutro Abs: 3.3 10*3/uL (ref 1.7–7.7)
Neutrophils Relative %: 66 %
Platelet Count: 256 10*3/uL (ref 150–400)
RBC: 4.29 MIL/uL (ref 4.22–5.81)
RDW: 16.3 % — ABNORMAL HIGH (ref 11.5–15.5)
WBC Count: 5.1 10*3/uL (ref 4.0–10.5)
nRBC: 0 % (ref 0.0–0.2)

## 2019-09-17 LAB — CMP (CANCER CENTER ONLY)
ALT: 10 U/L (ref 0–44)
AST: 17 U/L (ref 15–41)
Albumin: 3.6 g/dL (ref 3.5–5.0)
Alkaline Phosphatase: 53 U/L (ref 38–126)
Anion gap: 12 (ref 5–15)
BUN: 11 mg/dL (ref 8–23)
CO2: 24 mmol/L (ref 22–32)
Calcium: 9.1 mg/dL (ref 8.9–10.3)
Chloride: 105 mmol/L (ref 98–111)
Creatinine: 1.13 mg/dL (ref 0.61–1.24)
GFR, Est AFR Am: >60 mL/min (ref >60)
GFR, Estimated: >60 mL/min (ref >60)
Glucose, Bld: 111 mg/dL — ABNORMAL HIGH (ref 70–99)
Potassium: 3.7 mmol/L (ref 3.5–5.1)
Sodium: 141 mmol/L (ref 135–145)
Total Bilirubin: <0.2 mg/dL — ABNORMAL LOW (ref 0.3–1.2)
Total Protein: 7.8 g/dL (ref 6.5–8.1)

## 2019-09-17 LAB — TSH: TSH: 2.453 u[IU]/mL (ref 0.320–4.118)

## 2019-09-17 MED ORDER — SODIUM CHLORIDE 0.9% FLUSH
10.0000 mL | INTRAVENOUS | Status: DC | PRN
  Administered 2019-09-17: 10 mL
  Filled 2019-09-17: qty 10

## 2019-09-17 MED ORDER — FAMOTIDINE IN NACL 20-0.9 MG/50ML-% IV SOLN
INTRAVENOUS | Status: AC
  Filled 2019-09-17: qty 50

## 2019-09-17 MED ORDER — SODIUM CHLORIDE 0.9 % IV SOLN
1.0000 mg/kg | Freq: Once | INTRAVENOUS | Status: AC
  Administered 2019-09-17: 70 mg via INTRAVENOUS
  Filled 2019-09-17: qty 14

## 2019-09-17 MED ORDER — FAMOTIDINE IN NACL 20-0.9 MG/50ML-% IV SOLN
20.0000 mg | Freq: Once | INTRAVENOUS | Status: AC
  Administered 2019-09-17: 20 mg via INTRAVENOUS

## 2019-09-17 MED ORDER — SODIUM CHLORIDE 0.9 % IV SOLN
360.0000 mg | Freq: Once | INTRAVENOUS | Status: AC
  Administered 2019-09-17: 360 mg via INTRAVENOUS
  Filled 2019-09-17: qty 20

## 2019-09-17 MED ORDER — DIPHENHYDRAMINE HCL 50 MG/ML IJ SOLN
25.0000 mg | Freq: Once | INTRAMUSCULAR | Status: AC
  Administered 2019-09-17: 25 mg via INTRAVENOUS

## 2019-09-17 MED ORDER — DIPHENHYDRAMINE HCL 50 MG/ML IJ SOLN
INTRAMUSCULAR | Status: AC
  Filled 2019-09-17: qty 1

## 2019-09-17 MED ORDER — HEPARIN SOD (PORK) LOCK FLUSH 100 UNIT/ML IV SOLN
500.0000 [IU] | Freq: Once | INTRAVENOUS | Status: AC | PRN
  Administered 2019-09-17: 500 [IU]
  Filled 2019-09-17: qty 5

## 2019-09-17 MED ORDER — SODIUM CHLORIDE 0.9 % IV SOLN
Freq: Once | INTRAVENOUS | Status: AC
  Filled 2019-09-17: qty 250

## 2019-09-17 NOTE — Patient Instructions (Signed)
[facility] Discharge Instructions for Patients Receiving Chemotherapy  Today you received the following chemotherapy agents: Rae Halsted   To help prevent nausea and vomiting after your treatment, we encourage you to take your nausea medication as directed.    If you develop nausea and vomiting that is not controlled by your nausea medication, call the clinic.   BELOW ARE SYMPTOMS THAT SHOULD BE REPORTED IMMEDIATELY:  *FEVER GREATER THAN 100.5 F  *CHILLS WITH OR WITHOUT FEVER  NAUSEA AND VOMITING THAT IS NOT CONTROLLED WITH YOUR NAUSEA MEDICATION  *UNUSUAL SHORTNESS OF BREATH  *UNUSUAL BRUISING OR BLEEDING  TENDERNESS IN MOUTH AND THROAT WITH OR WITHOUT PRESENCE OF ULCERS  *URINARY PROBLEMS  *BOWEL PROBLEMS  UNUSUAL RASH Items with * indicate a potential emergency and should be followed up as soon as possible.  Feel free to call the clinic should you have any questions or concerns. The clinic phone number is [phone].  Please show the [facility] at check-in to the Emergency Department and triage nurse.

## 2019-09-26 ENCOUNTER — Ambulatory Visit (INDEPENDENT_AMBULATORY_CARE_PROVIDER_SITE_OTHER): Payer: Medicare Other | Admitting: Podiatry

## 2019-09-26 ENCOUNTER — Encounter: Payer: Self-pay | Admitting: Podiatry

## 2019-09-26 ENCOUNTER — Other Ambulatory Visit: Payer: Self-pay

## 2019-09-26 DIAGNOSIS — Q828 Other specified congenital malformations of skin: Secondary | ICD-10-CM | POA: Diagnosis not present

## 2019-09-26 DIAGNOSIS — S98139S Complete traumatic amputation of one unspecified lesser toe, sequela: Secondary | ICD-10-CM

## 2019-09-26 DIAGNOSIS — D689 Coagulation defect, unspecified: Secondary | ICD-10-CM

## 2019-09-26 NOTE — Progress Notes (Signed)
This patient returns to my office for at risk foot care.  This patient requires this care by a professional since this patient will be at risk due to having digital amputations both feet.  He has developed painful callus on both feet following the amputations. These nails are painful walking and wearing shoes.  This patient presents for at risk foot care today.  General Appearance  Alert, conversant and in no acute stress.  Vascular  Dorsalis pedis and posterior tibial  pulses are weakly  palpable  bilaterally.  Capillary return is within normal limits  bilaterally. Temperature is within normal limits  bilaterally.  Neurologic  Senn-Weinstein monofilament wire test within normal limits  bilaterally. Muscle power within normal limits bilaterally.  Nails No nails noted  B/L.  Orthopedic  No limitations of motion  feet .  No crepitus or effusions noted.  No bony pathology or digital deformities noted. Amputated digits  B/L.  Skin  normotropic skin noted bilaterally.  No signs of infections or ulcers noted.   Porokeratosis sub 2 right foot .  Callus at site of 1,2 amputation left foot.  Porokeratosis    Pain in right toes  Pain in left toes  Consent was obtained for treatment procedures.Debridement of callus  B/L with # 15 blade.   Return office visit   10 weeks                   Told patient to return for periodic foot care and evaluation due to potential at risk complications.   Gardiner Barefoot DPM

## 2019-10-02 NOTE — Progress Notes (Signed)
Pharmacist Chemotherapy Monitoring - Follow Up Assessment    I verify that I have reviewed each item in the below checklist:  . Regimen for the patient is scheduled for the appropriate day and plan matches scheduled date. Marland Kitchen Appropriate non-routine labs are ordered dependent on drug ordered. . If applicable, additional medications reviewed and ordered per protocol based on lifetime cumulative doses and/or treatment regimen.   Plan for follow-up and/or issues identified: No . I-vent associated with next due treatment: No . MD and/or nursing notified: No  Britt Boozer 10/02/2019 8:14 AM

## 2019-10-03 ENCOUNTER — Ambulatory Visit (HOSPITAL_COMMUNITY)
Admission: RE | Admit: 2019-10-03 | Discharge: 2019-10-03 | Disposition: A | Discharge status: Home / Self Care | Payer: Medicare Other | Source: Ambulatory Visit | Attending: Physician Assistant | Admitting: Physician Assistant

## 2019-10-03 ENCOUNTER — Other Ambulatory Visit: Payer: Self-pay

## 2019-10-03 DIAGNOSIS — I2510 Atherosclerotic heart disease of native coronary artery without angina pectoris: Secondary | ICD-10-CM | POA: Insufficient documentation

## 2019-10-03 DIAGNOSIS — I700 Atherosclerosis of aorta: Secondary | ICD-10-CM | POA: Insufficient documentation

## 2019-10-03 DIAGNOSIS — C3491 Malignant neoplasm of unspecified part of right bronchus or lung: Secondary | ICD-10-CM

## 2019-10-03 DIAGNOSIS — C3490 Malignant neoplasm of unspecified part of unspecified bronchus or lung: Secondary | ICD-10-CM | POA: Diagnosis not present

## 2019-10-03 LAB — GLUCOSE, CAPILLARY: Glucose-Capillary: 94 mg/dL (ref 70–99)

## 2019-10-03 MED ORDER — FLUDEOXYGLUCOSE F - 18 (FDG) INJECTION
8.6000 | Freq: Once | INTRAVENOUS | Status: AC | PRN
  Administered 2019-10-03: 8.6 via INTRAVENOUS

## 2019-10-09 ENCOUNTER — Encounter: Payer: Self-pay | Admitting: Internal Medicine

## 2019-10-09 ENCOUNTER — Other Ambulatory Visit: Payer: Self-pay

## 2019-10-09 ENCOUNTER — Inpatient Hospital Stay: Payer: Medicare Other

## 2019-10-09 ENCOUNTER — Inpatient Hospital Stay: Payer: Medicare Other | Attending: Internal Medicine | Admitting: Internal Medicine

## 2019-10-09 VITALS — BP 137/71 | HR 99 | Temp 97.7°F | Resp 19 | Ht 71.0 in | Wt 154.9 lb

## 2019-10-09 DIAGNOSIS — C3411 Malignant neoplasm of upper lobe, right bronchus or lung: Secondary | ICD-10-CM

## 2019-10-09 DIAGNOSIS — C3491 Malignant neoplasm of unspecified part of right bronchus or lung: Secondary | ICD-10-CM

## 2019-10-09 DIAGNOSIS — Z79899 Other long term (current) drug therapy: Secondary | ICD-10-CM | POA: Diagnosis not present

## 2019-10-09 DIAGNOSIS — Z5112 Encounter for antineoplastic immunotherapy: Secondary | ICD-10-CM

## 2019-10-09 DIAGNOSIS — J449 Chronic obstructive pulmonary disease, unspecified: Secondary | ICD-10-CM

## 2019-10-09 DIAGNOSIS — R5383 Other fatigue: Secondary | ICD-10-CM

## 2019-10-09 LAB — CMP (CANCER CENTER ONLY)
ALT: 8 U/L (ref 0–44)
AST: 16 U/L (ref 15–41)
Albumin: 3.7 g/dL (ref 3.5–5.0)
Alkaline Phosphatase: 54 U/L (ref 38–126)
Anion gap: 9 (ref 5–15)
BUN: 8 mg/dL (ref 8–23)
CO2: 26 mmol/L (ref 22–32)
Calcium: 9.4 mg/dL (ref 8.9–10.3)
Chloride: 105 mmol/L (ref 98–111)
Creatinine: 1.17 mg/dL (ref 0.61–1.24)
GFR, Est AFR Am: >60 mL/min (ref >60)
GFR, Estimated: >60 mL/min (ref >60)
Glucose, Bld: 123 mg/dL — ABNORMAL HIGH (ref 70–99)
Potassium: 3.8 mmol/L (ref 3.5–5.1)
Sodium: 140 mmol/L (ref 135–145)
Total Bilirubin: <0.2 mg/dL — ABNORMAL LOW (ref 0.3–1.2)
Total Protein: 7.5 g/dL (ref 6.5–8.1)

## 2019-10-09 LAB — CBC WITH DIFFERENTIAL (CANCER CENTER ONLY)
Abs Immature Granulocytes: 0.01 10*3/uL (ref 0.00–0.07)
Basophils Absolute: 0 10*3/uL (ref 0.0–0.1)
Basophils Relative: 0 %
Eosinophils Absolute: 0 10*3/uL (ref 0.0–0.5)
Eosinophils Relative: 1 %
HCT: 38.6 % — ABNORMAL LOW (ref 39.0–52.0)
Hemoglobin: 12.4 g/dL — ABNORMAL LOW (ref 13.0–17.0)
Immature Granulocytes: 0 %
Lymphocytes Relative: 24 %
Lymphs Abs: 1.3 10*3/uL (ref 0.7–4.0)
MCH: 28.4 pg (ref 26.0–34.0)
MCHC: 32.1 g/dL (ref 30.0–36.0)
MCV: 88.5 fL (ref 80.0–100.0)
Monocytes Absolute: 0.4 10*3/uL (ref 0.1–1.0)
Monocytes Relative: 8 %
Neutro Abs: 3.7 10*3/uL (ref 1.7–7.7)
Neutrophils Relative %: 67 %
Platelet Count: 206 10*3/uL (ref 150–400)
RBC: 4.36 MIL/uL (ref 4.22–5.81)
RDW: 15.8 % — ABNORMAL HIGH (ref 11.5–15.5)
WBC Count: 5.5 10*3/uL (ref 4.0–10.5)
nRBC: 0 % (ref 0.0–0.2)

## 2019-10-09 LAB — TSH: TSH: 2.496 u[IU]/mL (ref 0.320–4.118)

## 2019-10-09 MED ORDER — SODIUM CHLORIDE 0.9% FLUSH
10.0000 mL | INTRAVENOUS | Status: DC | PRN
  Administered 2019-10-09: 10 mL
  Filled 2019-10-09: qty 10

## 2019-10-09 MED ORDER — SODIUM CHLORIDE 0.9 % IV SOLN
Freq: Once | INTRAVENOUS | Status: AC
  Filled 2019-10-09: qty 250

## 2019-10-09 MED ORDER — HEPARIN SOD (PORK) LOCK FLUSH 100 UNIT/ML IV SOLN
500.0000 [IU] | Freq: Once | INTRAVENOUS | Status: AC | PRN
  Administered 2019-10-09: 500 [IU]
  Filled 2019-10-09: qty 5

## 2019-10-09 MED ORDER — SODIUM CHLORIDE 0.9 % IV SOLN
360.0000 mg | Freq: Once | INTRAVENOUS | Status: AC
  Administered 2019-10-09: 360 mg via INTRAVENOUS
  Filled 2019-10-09: qty 12

## 2019-10-09 NOTE — Progress Notes (Signed)
[place] [phone]   [phone]  OFFICE PROGRESS NOTE  [name], FNP 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  He has disease recurrence in August 2020.  PRIOR THERAPY:  1) Course of concurrent chemoradiation with weekly carboplatin for AUC of 2 and paclitaxel 45 MG/M2. First dose 03/01/2015. He is status post 6 cycles. Last cycle was given 04/12/2015 with partial response. 2) Consolidation chemotherapy with reduced dose carboplatin for AUC of 5 and paclitaxel 175 MG/M2 every 3 weeks, status post 3 cycles.  CURRENT THERAPY: Systemic chemotherapy with carboplatin for AUC of 5, paclitaxel 175 mg/M2 with nivolumab 360 mg IV every 3 weeks in addition to ipilimumab 1 mg/KG every 6 weeks.  First dose 01/08/2019.  Status post 6 cycles.  Starting from cycle #3 the patient will be on maintenance treatment with ipilimumab every 6 weeks and nivolumab every 3 weeks.  INTERVAL HISTORY: Jacqueline Delapena Job 74 y.o. male returns to the clinic today for follow-up visit accompanied by his sister.  The patient is feeling fine today with no concerning complaints except for mild shortness of breath with exertion and fatigue.  He denied having any current chest pain, cough or hemoptysis.  He denied having any fever or chills.  He has no nausea, vomiting, diarrhea or constipation.  He denied having any skin rash.  He has no recent weight loss or night sweats.  The patient was found on previous CT scan of the chest to have suspicious disease progression.  I ordered a PET scan that was performed recently and the patient is here today for evaluation and discussion of his scan  results and treatment options.   MEDICAL HISTORY: Past Medical History:  Diagnosis Date  . ALCOHOL ABUSE 11/12/2009   Qualifier: Diagnosis of  By: Hassell Done FNP, Tori Milks    . Allergy   . Anemia   . Anxiety   . BENIGN PROSTATIC HYPERTROPHY, HX OF 12/19/2006   Qualifier: Diagnosis of  By: Radene Ou MD, [place]    . Blood transfusion without reported diagnosis 2017  . Clotting disorder ([place])    takes PLAVIX  . Depression   . Emphysema of lung ([place])   . Full code status 02/10/2015  . GERD (gastroesophageal reflux disease)   . Hyperlipidemia   . Lung mass 01/25/2015   3.7 x 3.6 cm RUL spiculated lung mass with 2.0 cm right paratracheal lymph node suspicious for bronchogenic CA  . Non-small cell lung cancer ([place]) 01/27/15   non small-cell,squamous cell ca RUL  . Paranoid schizophrenia ([place]) 10/31/2009   Qualifier: Diagnosis of  By: Jorene Minors, [place]    . Primary spontaneous pneumothorax, left 01/25/2015  . TOBACCO ABUSE 05/24/2009   Qualifier: Diagnosis of  By: Hassell Done FNP, [place]      ALLERGIES:  is allergic to benztropine mesylate and chlorpromazine hcl.  MEDICATIONS:  Current Outpatient Medications  Medication Sig Dispense Refill  . ALPHAGAN P 0.1 % SOLN Place 1 drop into both eyes 3 (three) times daily.  6  . aspirin EC 81 MG tablet Take 81 mg by mouth 2 (two) times daily.    . benztropine (COGENTIN) 0.5 MG tablet Take 0.5 mg by mouth at bedtime.     Marland Kitchen  chlorhexidine (PERIDEX) 0.12 % solution 1 mL by Mouth Rinse route as needed.   1  . clopidogrel (PLAVIX) 75 MG tablet Take 75 mg by mouth daily.     . ferrous fumarate (HEMOCYTE - 106 MG FE) 325 (106 FE) MG TABS tablet Take 1 tablet by mouth daily.     . haloperidol (HALDOL) 5 MG tablet Take 5 mg by mouth at bedtime.     . Multiple Vitamins-Minerals (MULTIVITAMIN WITH MINERALS) tablet Take 1 tablet by mouth daily.    . NONFORMULARY OR COMPOUNDED Index  Combination Pain Cream -  Baclofen 2%, Doxepin 5%, Gabapentin 6%,  Topiramate 2%, Pentoxifylline 3% Apply 1-2 grams to affected area 3-4 times daily Qty. 120 gm 3 refills 1 each 3  . ondansetron (ZOFRAN) 8 MG tablet Take 1 tablet (8 mg total) by mouth every 8 (eight) hours as needed for nausea or vomiting. 20 tablet 0  . simvastatin (ZOCOR) 20 MG tablet Take 20 mg by mouth at bedtime.     Marland Kitchen umeclidinium-vilanterol (ANORO ELLIPTA) 62.5-25 MCG/INH AEPB Inhale 1 puff into the lungs daily. 1 each 11  . VYZULTA 0.024 % SOLN      Current Facility-Administered Medications  Medication Dose Route Frequency Provider Last Rate Last Admin  . 0.9 %  sodium chloride infusion  500 mL Intravenous Continuous Irene Shipper, MD      . 0.9 %  sodium chloride infusion  500 mL Intravenous Continuous Irene Shipper, MD      . 0.9 %  sodium chloride infusion  500 mL Intravenous Continuous Irene Shipper, MD        SURGICAL HISTORY:  Past Surgical History:  Procedure Laterality Date  . APPENDECTOMY    . CHEST TUBE INSERTION Left   . COLONOSCOPY    . INSERTION CENTRAL VENOUS ACCESS DEVICE W/ SUBCUTANEOUS PORT Right   . LUNG BIOPSY Right 01/27/2015   Procedure: Right Upper Lobe Bronchus BIOPSY;  Surgeon: Grace Isaac, MD;  Location: [facility];  Service: Thoracic;  Laterality: Right;  Marland Kitchen VIDEO BRONCHOSCOPY WITH ENDOBRONCHIAL ULTRASOUND N/A 01/27/2015   Procedure: VIDEO BRONCHOSCOPY WITH ENDOBRONCHIAL ULTRASOUND;  Surgeon: Grace Isaac, MD;  Location: [facility];  Service: Thoracic;  Laterality: N/A;    REVIEW OF SYSTEMS:  Constitutional: positive for fatigue Eyes: negative Ears, nose, mouth, throat, and face: negative Respiratory: positive for dyspnea on exertion Cardiovascular: negative Gastrointestinal: negative Genitourinary:negative Integument/breast: negative Hematologic/lymphatic: negative Musculoskeletal:negative Neurological: negative Behavioral/Psych: negative Endocrine: negative Allergic/Immunologic: negative   PHYSICAL EXAMINATION: General appearance: alert,  cooperative, fatigued and no distress Head: Normocephalic, without obvious abnormality, atraumatic Neck: no adenopathy, no JVD, supple, symmetrical, trachea midline and thyroid not enlarged, symmetric, no tenderness/mass/nodules Lymph nodes: Cervical, supraclavicular, and axillary nodes normal. Resp: clear to auscultation bilaterally Back: symmetric, no curvature. ROM normal. No CVA tenderness. Cardio: regular rate and rhythm, S1, S2 normal, no murmur, click, rub or gallop GI: soft, non-tender; bowel sounds normal; no masses,  no organomegaly Extremities: extremities normal, atraumatic, no cyanosis or edema Neurologic: Alert and oriented X 3, normal strength and tone. Normal symmetric reflexes. Normal coordination and gait  ECOG PERFORMANCE STATUS: 1 - Symptomatic but completely ambulatory  Blood pressure 137/71, pulse 99, temperature 97.7 F (36.5 C), temperature source Temporal, resp. rate 19, height 5\' 11"  (1.803 m), weight 154 lb 14.4 oz (70.3 kg), SpO2 100 %.  LABORATORY DATA: Lab Results  Component Value Date   WBC 5.5 10/09/2019   HGB 12.4 (L) 10/09/2019  HCT 38.6 (L) 10/09/2019   MCV 88.5 10/09/2019   PLT 206 10/09/2019      Chemistry      Component Value Date/Time   NA 141 09/17/2019 0840   NA 140 02/08/2017 0822   K 3.7 09/17/2019 0840   K 4.5 02/08/2017 0822   CL 105 09/17/2019 0840   CO2 24 09/17/2019 0840   CO2 25 02/08/2017 0822   BUN 11 09/17/2019 0840   BUN 8.8 02/08/2017 0822   CREATININE 1.13 09/17/2019 0840   CREATININE 1.1 02/08/2017 0822      Component Value Date/Time   CALCIUM 9.1 09/17/2019 0840   CALCIUM 9.6 02/08/2017 0822   ALKPHOS 53 09/17/2019 0840   ALKPHOS 53 02/08/2017 0822   AST 17 09/17/2019 0840   AST 19 02/08/2017 0822   ALT 10 09/17/2019 0840   ALT 9 02/08/2017 0822   BILITOT <0.2 (L) 09/17/2019 0840   BILITOT <0.22 02/08/2017 8563       RADIOGRAPHIC STUDIES: CT Chest W Contrast  Result Date: 09/15/2019 CLINICAL DATA:   74 year old male with history of lung cancer. Follow-up study. EXAM: CT CHEST WITH CONTRAST TECHNIQUE: Multidetector CT imaging of the chest was performed during intravenous contrast administration. CONTRAST:  41mL OMNIPAQUE IOHEXOL 300 MG/ML  SOLN COMPARISON:  Chest CT 06/23/2019. FINDINGS: Cardiovascular: Heart size is normal. There is no significant pericardial fluid, thickening or pericardial calcification. There is aortic atherosclerosis, as well as atherosclerosis of the great vessels of the mediastinum and the coronary arteries, including calcified atherosclerotic plaque in the left main, left anterior descending and right coronary arteries. Right internal jugular single-lumen porta cath with tip terminating in the right atrium. Mediastinum/Nodes: No pathologically enlarged mediastinal or hilar lymph nodes. Esophagus is unremarkable in appearance. No axillary lymphadenopathy. Lungs/Pleura: Chronic postradiation changes are again noted in the apex of the right lung. In the right upper lobe there is again a focal mass-like area which currently measures approximately 4.7 x 3.0 cm (axial image 51 of series 2) slightly smaller than the prior examination. However, along the inferior margin of this treated area, best appreciated on coronal image 61 of series 5 and sagittal image 53 of series 6) there are increasing areas of nodularity which are concerning for residual/recurrent disease, currently measuring approximately 2.3 x 2.9 x 1.2 cm. Chronic surrounding architectural distortion, volume loss and pleural thickening is similar to the prior study, as well as a small loculated pleural fluid collection in the posterior aspect of the upper right hemithorax. New clustered peribronchovascular micro nodularity in the left lower lobe, favored to reflect areas of mucoid impaction. Upper Abdomen: Aortic atherosclerosis. Numerous subcentimeter low-attenuation lesions in the liver, similar to prior studies, too small to  characterize, but statistically likely to represent tiny cysts. Musculoskeletal: There are no aggressive appearing lytic or blastic lesions noted in the visualized portions of the skeleton. IMPRESSION: 1. Although much of the treated area in the upper right lung appear stable or decreased compared to the prior examination, there is an area of worsening nodularity along the inferior margin of the treated area, detailed above, highly concerning for residual/recurrent disease. Further evaluation with PET-CT is suggested in the near future. 2. Probable areas of mucoid impaction in the basal segments of the left lower lobe. 3. Aortic atherosclerosis, in addition to left main and 2 vessel coronary artery disease. Assessment for potential risk factor modification, dietary therapy or pharmacologic therapy may be warranted, if clinically indicated. Aortic Atherosclerosis (ICD10-I70.0). Electronically Signed   By: Quillian Quince  Entrikin M.D.   On: 09/15/2019 10:24   NM PET Image Restag (PS) Skull Base To Thigh  Result Date: 10/03/2019 CLINICAL DATA:  Subsequent treatment strategy for non-small cell lung cancer. EXAM: NUCLEAR MEDICINE PET SKULL BASE TO THIGH TECHNIQUE: 8.6 mCi F-18 FDG was injected intravenously. Full-ring PET imaging was performed from the skull base to thigh after the radiotracer. CT data was obtained and used for attenuation correction and anatomic localization. Fasting blood glucose: 94 mg/dl COMPARISON:  Multiple prior studies 12/25/2018 most recent PET can [facility]. CT of the chest from Sep 15, 2019 is the most recent CT comparison study. FINDINGS: Mediastinal blood pool activity: SUV max 2.18 Liver activity: SUV max NA NECK: No hypermetabolic lymph nodes in the neck. Incidental CT findings: none CHEST: Area of more pronounced soft tissue in the anterior aspect of consolidated lung in the RIGHT chest shows persistent increased FDG uptake (SUVmax = 14.3) previously 23.4. The area of hypermetabolism  corresponds to a similar size area (image 65, series 4) area measuring approximately 3.7 x 2.3 cm, previously approximately 4 x 2.6 cm. Post treatment changes in the posterior RIGHT chest with low level hypermetabolism as before. Cardiac uptake with similar pattern and intensity as compared to the prior study. Myocardial uptake is more pronounced. The uptake in the RIGHT atrium is similarly increased with an SUV max of 9.1, remaining of uncertain significance. No signs of nodal disease. Mildly hypermetabolic region along the RIGHT mainstem bronchus extending towards the mediastinum without discrete lymph node (SUVmax = 3.6 previously 5.1) Incidental CT findings: Calcified atheromatous plaque of the thoracic aorta. Heart size is stable without pericardial effusion. Calcified coronary artery disease. Esophagus unremarkable. Small amount of loculated fluid in the posterior RIGHT chest is seen on the recent CT of the chest adjacent to post treatment changes.Nodularity at the LEFT lung base is improved compared to the previous study accounting for differences in technique. Since the previous PET exam the LEFT-sided nodule that showed FDG uptake has resolved. ABDOMEN/PELVIS: No abnormal hypermetabolic activity within the liver, pancreas, adrenal glands, or spleen. No hypermetabolic lymph nodes in the abdomen or pelvis. Incidental CT findings: Scattered atherosclerosis. No aneurysmal dilation of the abdominal aorta. No acute gastrointestinal process. No hydronephrosis. SKELETON: No focal hypermetabolic activity to suggest skeletal metastasis. Incidental CT findings: none IMPRESSION: 1. Persistent but diminished FDG uptake in the treated area in the RIGHT upper lobe when compared to the prior study PET scan. The area of concern in the RIGHT upper lobe raised on the recent PET-CT displays persistent FDG uptake. 2. No signs of adenopathy in the chest with FDG avidity. 3. Resolution of contralateral nodule since previous  PET-CT. 4. Scattered nodules in the LEFT lung base may be slightly improved when compared to previous imaging. 5. Uptake in the RIGHT atrium remains of uncertain significance. Attention on follow-up. Pooling in the RIGHT atrium of administered FDG is a consideration as outlined before. Most recent CT shows no definitive mass lesion in this area. 6. Nodularity at the LEFT lung base felt to represent infectious or inflammatory changes, attention on follow-up Electronically Signed   By: Zetta Bills M.D.   On: 10/03/2019 16:19    ASSESSMENT AND PLAN:  This is a very pleasant 74 years old African-American male with recurrent non-small cell lung cancer initially diagnosed as stage IIIA non-small cell lung cancer, squamous cell carcinoma status post concurrent chemoradiation followed by consolidation chemotherapy. The patient has been in observation for close to 3 years. He had  evidence for disease recurrence on the recent imaging studies including CT scan of the chest as well as PET scan. The patient is started  treatment with systemic chemotherapy with carboplatin for AUC of 5, paclitaxel 175 mg/M2 as well as combination of immunotherapy with nivolumab 360 mg IV every 3 weeks and ipilimumab 1 mg/KG every 6 weeks.  The chemotherapy will be given only for 2 cycles and then the patient will be maintained on immunotherapy with nivolumab and ipilimumab for a total of 2 years if he has no evidence for disease progression or unacceptable toxicity.  He is status post 6 cycles of treatment with systemic chemotherapy as well as immunotherapy with ipilimumab and nivolumab.  He is currently undergoing cycle #7 of his treatment. Starting from cycle #3 the patient will be on maintenance treatment with ipilimumab 1 mg/KG every 6 weeks in addition to nivolumab 360 MG IV every 3 weeks.  He is tolerating his treatment well with no concerning adverse effects. The patient had a PET scan for evaluation of the suspicious disease  progression in his lung.  The PET scan showed no concerning findings for disease progression and there is persistent but low activity in the treated right upper lobe area. I recommended for the patient to continue his current treatment with ipilimumab and nivolumab. He will proceed with day 22 of cycle #7 today. The patient will come back for follow-up visit in 3 weeks for evaluation before the next cycle of his treatment. He was advised to call immediately if he has any concerning symptoms in the interval.  Disclaimer: This note was dictated with voice recognition software. Similar sounding words can inadvertently be transcribed and may not be corrected upon review.

## 2019-10-09 NOTE — Patient Instructions (Signed)
[facility] Discharge Instructions for Patients Receiving Chemotherapy  Today you received the following chemotherapy agents: nivolumab.  To help prevent nausea and vomiting after your treatment, we encourage you to take your nausea medication as directed.   If you develop nausea and vomiting that is not controlled by your nausea medication, call the clinic.   BELOW ARE SYMPTOMS THAT SHOULD BE REPORTED IMMEDIATELY:  *FEVER GREATER THAN 100.5 F  *CHILLS WITH OR WITHOUT FEVER  NAUSEA AND VOMITING THAT IS NOT CONTROLLED WITH YOUR NAUSEA MEDICATION  *UNUSUAL SHORTNESS OF BREATH  *UNUSUAL BRUISING OR BLEEDING  TENDERNESS IN MOUTH AND THROAT WITH OR WITHOUT PRESENCE OF ULCERS  *URINARY PROBLEMS  *BOWEL PROBLEMS  UNUSUAL RASH Items with * indicate a potential emergency and should be followed up as soon as possible.  Feel free to call the clinic should you have any questions or concerns. The clinic phone number is [phone].  Please show the CHEMO ALERT CARD at check-in to the Emergency Department and triage nurse.   

## 2019-10-10 ENCOUNTER — Telehealth: Payer: Self-pay | Admitting: Internal Medicine

## 2019-10-10 DIAGNOSIS — F209 Schizophrenia, unspecified: Secondary | ICD-10-CM | POA: Diagnosis not present

## 2019-10-10 NOTE — Telephone Encounter (Signed)
Scheduled per los. Called and left msg. Mailed printout  °

## 2019-10-29 ENCOUNTER — Inpatient Hospital Stay: Payer: Medicare Other

## 2019-10-29 ENCOUNTER — Other Ambulatory Visit: Payer: Self-pay

## 2019-10-29 ENCOUNTER — Inpatient Hospital Stay (HOSPITAL_BASED_OUTPATIENT_CLINIC_OR_DEPARTMENT_OTHER): Payer: Medicare Other | Admitting: Internal Medicine

## 2019-10-29 ENCOUNTER — Encounter: Payer: Self-pay | Admitting: Internal Medicine

## 2019-10-29 VITALS — BP 135/86 | HR 100 | Temp 97.7°F | Resp 19 | Ht 71.0 in | Wt 154.4 lb

## 2019-10-29 DIAGNOSIS — Z5112 Encounter for antineoplastic immunotherapy: Secondary | ICD-10-CM

## 2019-10-29 DIAGNOSIS — J449 Chronic obstructive pulmonary disease, unspecified: Secondary | ICD-10-CM

## 2019-10-29 DIAGNOSIS — R5383 Other fatigue: Secondary | ICD-10-CM

## 2019-10-29 DIAGNOSIS — C3411 Malignant neoplasm of upper lobe, right bronchus or lung: Secondary | ICD-10-CM

## 2019-10-29 DIAGNOSIS — C3491 Malignant neoplasm of unspecified part of right bronchus or lung: Secondary | ICD-10-CM

## 2019-10-29 LAB — CMP (CANCER CENTER ONLY)
ALT: 14 U/L (ref 0–44)
AST: 19 U/L (ref 15–41)
Albumin: 3.9 g/dL (ref 3.5–5.0)
Alkaline Phosphatase: 54 U/L (ref 38–126)
Anion gap: 6 (ref 5–15)
BUN: 7 mg/dL — ABNORMAL LOW (ref 8–23)
CO2: 28 mmol/L (ref 22–32)
Calcium: 9.8 mg/dL (ref 8.9–10.3)
Chloride: 104 mmol/L (ref 98–111)
Creatinine: 1.15 mg/dL (ref 0.61–1.24)
GFR, Est AFR Am: >60 mL/min (ref >60)
GFR, Estimated: >60 mL/min (ref >60)
Glucose, Bld: 85 mg/dL (ref 70–99)
Potassium: 4 mmol/L (ref 3.5–5.1)
Sodium: 138 mmol/L (ref 135–145)
Total Bilirubin: 0.2 mg/dL — ABNORMAL LOW (ref 0.3–1.2)
Total Protein: 8.2 g/dL — ABNORMAL HIGH (ref 6.5–8.1)

## 2019-10-29 LAB — CBC WITH DIFFERENTIAL (CANCER CENTER ONLY)
Abs Immature Granulocytes: 0.01 10*3/uL (ref 0.00–0.07)
Basophils Absolute: 0 10*3/uL (ref 0.0–0.1)
Basophils Relative: 0 %
Eosinophils Absolute: 0 10*3/uL (ref 0.0–0.5)
Eosinophils Relative: 1 %
HCT: 41 % (ref 39.0–52.0)
Hemoglobin: 13.2 g/dL (ref 13.0–17.0)
Immature Granulocytes: 0 %
Lymphocytes Relative: 28 %
Lymphs Abs: 1.6 10*3/uL (ref 0.7–4.0)
MCH: 28.9 pg (ref 26.0–34.0)
MCHC: 32.2 g/dL (ref 30.0–36.0)
MCV: 89.9 fL (ref 80.0–100.0)
Monocytes Absolute: 0.6 10*3/uL (ref 0.1–1.0)
Monocytes Relative: 11 %
Neutro Abs: 3.5 10*3/uL (ref 1.7–7.7)
Neutrophils Relative %: 60 %
Platelet Count: 233 10*3/uL (ref 150–400)
RBC: 4.56 MIL/uL (ref 4.22–5.81)
RDW: 16 % — ABNORMAL HIGH (ref 11.5–15.5)
WBC Count: 5.8 10*3/uL (ref 4.0–10.5)
nRBC: 0 % (ref 0.0–0.2)

## 2019-10-29 LAB — TSH: TSH: 2.74 u[IU]/mL (ref 0.320–4.118)

## 2019-10-29 MED ORDER — DIPHENHYDRAMINE HCL 50 MG/ML IJ SOLN
25.0000 mg | Freq: Once | INTRAMUSCULAR | Status: AC
  Administered 2019-10-29: 25 mg via INTRAVENOUS

## 2019-10-29 MED ORDER — SODIUM CHLORIDE 0.9 % IV SOLN
360.0000 mg | Freq: Once | INTRAVENOUS | Status: AC
  Administered 2019-10-29: 360 mg via INTRAVENOUS
  Filled 2019-10-29: qty 16

## 2019-10-29 MED ORDER — SODIUM CHLORIDE 0.9 % IV SOLN
1.0000 mg/kg | Freq: Once | INTRAVENOUS | Status: AC
  Administered 2019-10-29: 70 mg via INTRAVENOUS
  Filled 2019-10-29: qty 14

## 2019-10-29 MED ORDER — DIPHENHYDRAMINE HCL 50 MG/ML IJ SOLN
INTRAMUSCULAR | Status: AC
  Filled 2019-10-29: qty 1

## 2019-10-29 MED ORDER — SODIUM CHLORIDE 0.9% FLUSH
10.0000 mL | INTRAVENOUS | Status: DC | PRN
  Administered 2019-10-29: 10 mL
  Filled 2019-10-29: qty 10

## 2019-10-29 MED ORDER — HEPARIN SOD (PORK) LOCK FLUSH 100 UNIT/ML IV SOLN
500.0000 [IU] | Freq: Once | INTRAVENOUS | Status: AC | PRN
  Administered 2019-10-29: 500 [IU]
  Filled 2019-10-29: qty 5

## 2019-10-29 MED ORDER — FAMOTIDINE IN NACL 20-0.9 MG/50ML-% IV SOLN
INTRAVENOUS | Status: AC
  Filled 2019-10-29: qty 50

## 2019-10-29 MED ORDER — FAMOTIDINE IN NACL 20-0.9 MG/50ML-% IV SOLN
20.0000 mg | Freq: Once | INTRAVENOUS | Status: AC
  Administered 2019-10-29: 20 mg via INTRAVENOUS

## 2019-10-29 MED ORDER — SODIUM CHLORIDE 0.9 % IV SOLN
Freq: Once | INTRAVENOUS | Status: AC
  Filled 2019-10-29: qty 250

## 2019-10-29 NOTE — Patient Instructions (Signed)

## 2019-10-29 NOTE — Progress Notes (Signed)
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  He has disease recurrence in August 2020.  PRIOR THERAPY:  1) Course of concurrent chemoradiation with weekly carboplatin for AUC of 2 and paclitaxel 45 MG/M2. First dose 03/01/2015. He is status post 6 cycles. Last cycle was given 04/12/2015 with partial response. 2) Consolidation chemotherapy with reduced dose carboplatin for AUC of 5 and paclitaxel 175 MG/M2 every 3 weeks, status post 3 cycles.  CURRENT THERAPY: Systemic chemotherapy with carboplatin for AUC of 5, paclitaxel 175 mg/M2 with nivolumab 360 mg IV every 3 weeks in addition to ipilimumab 1 mg/KG every 6 weeks.  First dose 01/08/2019.  Status post 7 cycles.  Starting from cycle #3 the patient will be on maintenance treatment with ipilimumab every 6 weeks and nivolumab every 3 weeks.  INTERVAL HISTORY: Ivan Daniels 74 y.o. male returns to the clinic today for follow-up visit.  The patient is feeling fine today with no concerning complaints.  He continues to tolerate his treatment with immunotherapy fairly well.  He denied having any chest pain, shortness of breath, cough or hemoptysis.  He denied having any fever or chills.  He has no nausea, vomiting, diarrhea or constipation.  He denied having any headache or visual changes.  His psychiatrist changed his medication from Cogentin to [place] and the patient has some tremor and has hand.  The patient is here today for evaluation before starting cycle #8 of his treatment.   MEDICAL HISTORY: Past Medical History:   Diagnosis Date  . ALCOHOL ABUSE 11/12/2009   Qualifier: Diagnosis of  By: Hassell Done FNP, Tori Milks    . Allergy   . Anemia   . Anxiety   . BENIGN PROSTATIC HYPERTROPHY, HX OF 12/19/2006   Qualifier: Diagnosis of  By: Radene Ou MD, [place]    . Blood transfusion without reported diagnosis 2017  . Clotting disorder ([place])    takes PLAVIX  . Depression   . Emphysema of lung ([place])   . Full code status 02/10/2015  . GERD (gastroesophageal reflux disease)   . Hyperlipidemia   . Lung mass 01/25/2015   3.7 x 3.6 cm RUL spiculated lung mass with 2.0 cm right paratracheal lymph node suspicious for bronchogenic CA  . Non-small cell lung cancer ([place]) 01/27/15   non small-cell,squamous cell ca RUL  . Paranoid schizophrenia ([place]) 10/31/2009   Qualifier: Diagnosis of  By: Jorene Minors, [place]    . Primary spontaneous pneumothorax, left 01/25/2015  . TOBACCO ABUSE 05/24/2009   Qualifier: Diagnosis of  By: Hassell Done FNP, [place]      ALLERGIES:  is allergic to benztropine mesylate and chlorpromazine hcl.  MEDICATIONS:  Current Outpatient Medications  Medication Sig Dispense Refill  . ALPHAGAN P 0.1 % SOLN Place 1 drop into both eyes 3 (three) times daily.  6  . aspirin EC 81 MG tablet Take 81 mg by mouth 2 (two) times daily.    . chlorhexidine (PERIDEX) 0.12 % solution 1 mL by Mouth Rinse route as needed.   1  . clopidogrel (PLAVIX) 75 MG tablet Take 75 mg by mouth daily.     Marland Kitchen  ferrous fumarate (HEMOCYTE - 106 MG FE) 325 (106 FE) MG TABS tablet Take 1 tablet by mouth daily.     . haloperidol (HALDOL) 5 MG tablet Take 5 mg by mouth at bedtime.     . INGREZZA 40 MG CAPS Take 1 capsule by mouth daily.    . Multiple Vitamins-Minerals (MULTIVITAMIN WITH MINERALS) tablet Take 1 tablet by mouth daily.    . NONFORMULARY OR COMPOUNDED [place]  Combination Pain Cream -  Baclofen 2%, Doxepin 5%, Gabapentin 6%, Topiramate 2%, Pentoxifylline 3% Apply 1-2 grams to affected area 3-4 times daily Qty. 120 gm  3 refills 1 each 3  . ondansetron (ZOFRAN) 8 MG tablet Take 1 tablet (8 mg total) by mouth every 8 (eight) hours as needed for nausea or vomiting. 20 tablet 0  . simvastatin (ZOCOR) 20 MG tablet Take 20 mg by mouth at bedtime.     Marland Kitchen umeclidinium-vilanterol (ANORO ELLIPTA) 62.5-25 MCG/INH AEPB Inhale 1 puff into the lungs daily. 1 each 11  . VYZULTA 0.024 % SOLN      Current Facility-Administered Medications  Medication Dose Route Frequency Provider Last Rate Last Admin  . 0.9 %  sodium chloride infusion  500 mL Intravenous Continuous Irene Shipper, MD      . 0.9 %  sodium chloride infusion  500 mL Intravenous Continuous Irene Shipper, MD      . 0.9 %  sodium chloride infusion  500 mL Intravenous Continuous Irene Shipper, MD        SURGICAL HISTORY:  Past Surgical History:  Procedure Laterality Date  . APPENDECTOMY    . CHEST TUBE INSERTION Left   . COLONOSCOPY    . INSERTION CENTRAL VENOUS ACCESS DEVICE W/ SUBCUTANEOUS PORT Right   . LUNG BIOPSY Right 01/27/2015   Procedure: Right Upper Lobe Bronchus BIOPSY;  Surgeon: Grace Isaac, MD;  Location: [facility];  Service: Thoracic;  Laterality: Right;  Marland Kitchen VIDEO BRONCHOSCOPY WITH ENDOBRONCHIAL ULTRASOUND N/A 01/27/2015   Procedure: VIDEO BRONCHOSCOPY WITH ENDOBRONCHIAL ULTRASOUND;  Surgeon: Grace Isaac, MD;  Location: [facility];  Service: Thoracic;  Laterality: N/A;    REVIEW OF SYSTEMS:  A comprehensive review of systems was negative except for: Constitutional: positive for fatigue Neurological: positive for tremors   PHYSICAL EXAMINATION: General appearance: alert, cooperative, fatigued and no distress Head: Normocephalic, without obvious abnormality, atraumatic Neck: no adenopathy, no JVD, supple, symmetrical, trachea midline and thyroid not enlarged, symmetric, no tenderness/mass/nodules Lymph nodes: Cervical, supraclavicular, and axillary nodes normal. Resp: clear to auscultation bilaterally Back: symmetric, no curvature. ROM  normal. No CVA tenderness. Cardio: regular rate and rhythm, S1, S2 normal, no murmur, click, rub or gallop GI: soft, non-tender; bowel sounds normal; no masses,  no organomegaly Extremities: extremities normal, atraumatic, no cyanosis or edema  ECOG PERFORMANCE STATUS: 1 - Symptomatic but completely ambulatory  Blood pressure 135/86, pulse 100, temperature 97.7 F (36.5 C), temperature source Temporal, resp. rate 19, height 5\' 11"  (1.803 m), weight 154 lb 6.4 oz (70 kg), SpO2 100 %.  LABORATORY DATA: Lab Results  Component Value Date   WBC 5.5 10/09/2019   HGB 12.4 (L) 10/09/2019   HCT 38.6 (L) 10/09/2019   MCV 88.5 10/09/2019   PLT 206 10/09/2019      Chemistry      Component Value Date/Time   NA 140 10/09/2019 1015   NA 140 02/08/2017 0822   K 3.8 10/09/2019 1015   K 4.5 02/08/2017 0822   CL 105  10/09/2019 1015   CO2 26 10/09/2019 1015   CO2 25 02/08/2017 0822   BUN 8 10/09/2019 1015   BUN 8.8 02/08/2017 0822   CREATININE 1.17 10/09/2019 1015   CREATININE 1.1 02/08/2017 0822      Component Value Date/Time   CALCIUM 9.4 10/09/2019 1015   CALCIUM 9.6 02/08/2017 0822   ALKPHOS 54 10/09/2019 1015   ALKPHOS 53 02/08/2017 0822   AST 16 10/09/2019 1015   AST 19 02/08/2017 0822   ALT 8 10/09/2019 1015   ALT 9 02/08/2017 0822   BILITOT <0.2 (L) 10/09/2019 1015   BILITOT <0.22 02/08/2017 4944       RADIOGRAPHIC STUDIES: NM PET Image Restag (PS) Skull Base To Thigh  Result Date: 10/03/2019 CLINICAL DATA:  Subsequent treatment strategy for non-small cell lung cancer. EXAM: NUCLEAR MEDICINE PET SKULL BASE TO THIGH TECHNIQUE: 8.6 mCi F-18 FDG was injected intravenously. Full-ring PET imaging was performed from the skull base to thigh after the radiotracer. CT data was obtained and used for attenuation correction and anatomic localization. Fasting blood glucose: 94 mg/dl COMPARISON:  Multiple prior studies 12/25/2018 most recent PET can [facility]. CT of the chest from Sep 15, 2019 is the most recent CT comparison study. FINDINGS: Mediastinal blood pool activity: SUV max 2.18 Liver activity: SUV max NA NECK: No hypermetabolic lymph nodes in the neck. Incidental CT findings: none CHEST: Area of more pronounced soft tissue in the anterior aspect of consolidated lung in the RIGHT chest shows persistent increased FDG uptake (SUVmax = 14.3) previously 23.4. The area of hypermetabolism corresponds to a similar size area (image 65, series 4) area measuring approximately 3.7 x 2.3 cm, previously approximately 4 x 2.6 cm. Post treatment changes in the posterior RIGHT chest with low level hypermetabolism as before. Cardiac uptake with similar pattern and intensity as compared to the prior study. Myocardial uptake is more pronounced. The uptake in the RIGHT atrium is similarly increased with an SUV max of 9.1, remaining of uncertain significance. No signs of nodal disease. Mildly hypermetabolic region along the RIGHT mainstem bronchus extending towards the mediastinum without discrete lymph node (SUVmax = 3.6 previously 5.1) Incidental CT findings: Calcified atheromatous plaque of the thoracic aorta. Heart size is stable without pericardial effusion. Calcified coronary artery disease. Esophagus unremarkable. Small amount of loculated fluid in the posterior RIGHT chest is seen on the recent CT of the chest adjacent to post treatment changes.Nodularity at the LEFT lung base is improved compared to the previous study accounting for differences in technique. Since the previous PET exam the LEFT-sided nodule that showed FDG uptake has resolved. ABDOMEN/PELVIS: No abnormal hypermetabolic activity within the liver, pancreas, adrenal glands, or spleen. No hypermetabolic lymph nodes in the abdomen or pelvis. Incidental CT findings: Scattered atherosclerosis. No aneurysmal dilation of the abdominal aorta. No acute gastrointestinal process. No hydronephrosis. SKELETON: No focal hypermetabolic activity to  suggest skeletal metastasis. Incidental CT findings: none IMPRESSION: 1. Persistent but diminished FDG uptake in the treated area in the RIGHT upper lobe when compared to the prior study PET scan. The area of concern in the RIGHT upper lobe raised on the recent PET-CT displays persistent FDG uptake. 2. No signs of adenopathy in the chest with FDG avidity. 3. Resolution of contralateral nodule since previous PET-CT. 4. Scattered nodules in the LEFT lung base may be slightly improved when compared to previous imaging. 5. Uptake in the RIGHT atrium remains of uncertain significance. Attention on follow-up. Pooling in the RIGHT atrium of  administered FDG is a consideration as outlined before. Most recent CT shows no definitive mass lesion in this area. 6. Nodularity at the LEFT lung base felt to represent infectious or inflammatory changes, attention on follow-up Electronically Signed   By: Zetta Bills M.D.   On: 10/03/2019 16:19    ASSESSMENT AND PLAN:  This is a very pleasant 74 years old African-American male with recurrent non-small cell lung cancer initially diagnosed as stage IIIA non-small cell lung cancer, squamous cell carcinoma status post concurrent chemoradiation followed by consolidation chemotherapy. The patient has been in observation for close to 3 years. He had evidence for disease recurrence on the recent imaging studies including CT scan of the chest as well as PET scan. The patient is started  treatment with systemic chemotherapy with carboplatin for AUC of 5, paclitaxel 175 mg/M2 as well as combination of immunotherapy with nivolumab 360 mg IV every 3 weeks and ipilimumab 1 mg/KG every 6 weeks.  The chemotherapy will be given only for 2 cycles and then the patient will be maintained on immunotherapy with nivolumab and ipilimumab for a total of 2 years if he has no evidence for disease progression or unacceptable toxicity.  He is status post 7 cycles of treatment with systemic chemotherapy  as well as immunotherapy with ipilimumab and nivolumab.  The patient continues to tolerate his treatment fairly well. I recommended for him to proceed with day 1 of cycle #8 today as planned. The patient will come back for follow-up visit in 3 weeks for evaluation before starting day 22 of cycle #8. The patient was advised to reach out to his psychiatrist for evaluation of the new tremor after switching his treatment to [place]. He was advised to call immediately if he has any other concerning symptoms in the interval. Disclaimer: This note was dictated with voice recognition software. Similar sounding words can inadvertently be transcribed and may not be corrected upon review.

## 2019-11-17 NOTE — Progress Notes (Signed)
[facility] OFFICE PROGRESS NOTE  [name], FNP [address] [place] [address]  DIAGNOSIS: Recurrent non-small cell lung cancer initially diagnosed as stage IIIA (T2a, N2, M0) non-small cell lung cancer consistent with invasive squamous cell carcinoma presented with right upper lobe lung mass in addition to right hilar and mediastinal lymphadenopathy diagnosed in September 2016. He has disease recurrence in August 2020.  PRIOR THERAPY: 1) Course of concurrent chemoradiation with weekly carboplatin for AUC of 2 and paclitaxel 45 MG/M2. First dose 03/01/2015. He is status post 6 cycles. Last cycle was given 04/12/2015 with partial response. 2) Consolidation chemotherapy with reduced dose carboplatin for AUC of 5 and paclitaxel 175 MG/M2 every 3 weeks, status post 3 cycles.  CURRENT THERAPY: Systemic chemotherapy with carboplatin for AUC of 5, paclitaxel 175 mg/M2 with nivolumab 360 mg IV every 3 weeks in addition to ipilimumab 1 mg/KG every 6 weeks. First dose 01/08/2019. Status postday 1 ofcycle 8.Starting from cycle #3 the patient will be on maintenance treatment with ipilimumab every 6 weeks and nivolumab every 3 weeks  INTERVAL HISTORY: Ivan Daniels 74 y.o. male returns to the clinic for a follow up visit accompanied by his sister. The patient is feeling well today without any concerning complaints. The patient continues to tolerate treatment with immunotherapy well without any adverse effects. Denies any fever, chills, night sweats, or weight loss. Denies any chest pain, shortness of breath, or hemoptysis. They report a cough that "comes and goes". Denies any nausea, vomiting, diarrhea, or constipation. Denies any headache or visual changes. Denies any rashes or skin changes. The patient is here today for evaluation prior to starting cycle # 8 Day 22.   MEDICAL HISTORY: Past Medical History:  Diagnosis Date  . ALCOHOL ABUSE 11/12/2009   Qualifier:  Diagnosis of  By: Hassell Done FNP, Tori Milks    . Allergy   . Anemia   . Anxiety   . BENIGN PROSTATIC HYPERTROPHY, HX OF 12/19/2006   Qualifier: Diagnosis of  By: Radene Ou MD, [place]    . Blood transfusion without reported diagnosis 2017  . Clotting disorder ([place])    takes PLAVIX  . Depression   . Emphysema of lung ([place])   . Full code status 02/10/2015  . GERD (gastroesophageal reflux disease)   . Hyperlipidemia   . Lung mass 01/25/2015   3.7 x 3.6 cm RUL spiculated lung mass with 2.0 cm right paratracheal lymph node suspicious for bronchogenic CA  . Non-small cell lung cancer ([facility]) 01/27/15   non small-cell,squamous cell ca RUL  . Paranoid schizophrenia ([place]) 10/31/2009   Qualifier: Diagnosis of  By: Jorene Minors, [place]    . Primary spontaneous pneumothorax, left 01/25/2015  . TOBACCO ABUSE 05/24/2009   Qualifier: Diagnosis of  By: Hassell Done FNP, [place]ked[place]      ALLERGIES:  is allergic to benztropine mesylate and chlorpromazine hcl.  MEDICATIONS:  Current Outpatient Medications  Medication Sig Dispense Refill  . ALPHAGAN P 0.1 % SOLN Place 1 drop into both eyes 3 (three) times daily.  6  . aspirin EC 81 MG tablet Take 81 mg by mouth 2 (two) times daily.    . chlorhexidine (PERIDEX) 0.12 % solution 1 mL by Mouth Rinse route as needed.   1  . clopidogrel (PLAVIX) 75 MG tablet Take 75 mg by mouth daily.     . ferrous fumarate (HEMOCYTE - 106 MG FE) 325 (106 FE) MG TABS tablet Take 1 tablet by mouth daily.     Marland Kitchen  haloperidol (HALDOL) 5 MG tablet Take 5 mg by mouth at bedtime.     . INGREZZA 40 MG CAPS Take 1 capsule by mouth daily.    . Multiple Vitamins-Minerals (MULTIVITAMIN WITH MINERALS) tablet Take 1 tablet by mouth daily.    . NONFORMULARY OR COMPOUNDED [place]  Combination Pain Cream -  Baclofen 2%, Doxepin 5%, Gabapentin 6%, Topiramate 2%, Pentoxifylline 3% Apply 1-2 grams to affected area 3-4 times daily Qty. 120 gm 3 refills 1 each 3  . ondansetron (ZOFRAN) 8 MG tablet  Take 1 tablet (8 mg total) by mouth every 8 (eight) hours as needed for nausea or vomiting. 20 tablet 0  . simvastatin (ZOCOR) 20 MG tablet Take 20 mg by mouth at bedtime.     Marland Kitchen umeclidinium-vilanterol (ANORO ELLIPTA) 62.5-25 MCG/INH AEPB Inhale 1 puff into the lungs daily. 1 each 11  . VYZULTA 0.024 % SOLN      Current Facility-Administered Medications  Medication Dose Route Frequency Provider Last Rate Last Admin  . 0.9 %  sodium chloride infusion  500 mL Intravenous Continuous Irene Shipper, MD      . 0.9 %  sodium chloride infusion  500 mL Intravenous Continuous Irene Shipper, MD      . 0.9 %  sodium chloride infusion  500 mL Intravenous Continuous Irene Shipper, MD        SURGICAL HISTORY:  Past Surgical History:  Procedure Laterality Date  . APPENDECTOMY    . CHEST TUBE INSERTION Left   . COLONOSCOPY    . INSERTION CENTRAL VENOUS ACCESS DEVICE W/ SUBCUTANEOUS PORT Right   . LUNG BIOPSY Right 01/27/2015   Procedure: Right Upper Lobe Bronchus BIOPSY;  Surgeon: Grace Isaac, MD;  Location: [facility];  Service: Thoracic;  Laterality: Right;  Marland Kitchen VIDEO BRONCHOSCOPY WITH ENDOBRONCHIAL ULTRASOUND N/A 01/27/2015   Procedure: VIDEO BRONCHOSCOPY WITH ENDOBRONCHIAL ULTRASOUND;  Surgeon: Grace Isaac, MD;  Location: [facility];  Service: Thoracic;  Laterality: N/A;    REVIEW OF SYSTEMS:   Review of Systems  Constitutional: Negative for appetite change, chills, fatigue, fever and unexpected weight change.  HENT:Negative for mouth sores, nosebleeds, sore throat and trouble swallowing.   Eyes: Negative for eye problems and icterus.  Respiratory: Positive for occassional dry cough. Negative for hemoptysis or shortness of breath. Cardiovascular: Negative for chest pain and leg swelling.  Gastrointestinal: Negative for abdominal pain, constipation, diarrhea, nausea and vomiting.  Genitourinary: Negative for bladder incontinence, difficulty urinating, dysuria, frequency and hematuria.    Musculoskeletal: Negative for back pain, gait problem, neck pain and neck stiffness.  Skin: Negative for itching and rash.  Neurological: Negative for dizziness, extremity weakness, gait problem, headaches, light-headedness and seizures.  Hematological: Negative for adenopathy. Does not bruise/bleed easily.  Psychiatric/Behavioral: Negative for confusion, depression and sleep disturbance. The patient is not nervous/anxious.      PHYSICAL EXAMINATION:  Blood pressure 129/68, pulse 97, temperature 98.6 F (37 C), temperature source Oral, resp. rate 18, weight 155 lb 8 oz (70.5 kg), SpO2 100 %.  ECOG PERFORMANCE STATUS: 1 - Symptomatic but completely ambulatory  Physical Exam  Constitutional: Oriented to person, place, and time and well-developed, well-nourished, and in no distress.  HENT:  Head: Normocephalic and atraumatic.  Mouth/Throat: Oropharynx is clear and moist. No oropharyngeal exudate.  Eyes: Conjunctivae are normal. Right eye exhibits no discharge. Left eye exhibits no discharge. No scleral icterus.  Neck: Normal range of motion. Neck supple.  Cardiovascular: Normal rate, regular rhythm,  normal heart sounds and intact distal pulses.   Pulmonary/Chest: Effort normal and breath sounds normal. No respiratory distress. No wheezes. No rales.  Abdominal: Soft. Bowel sounds are normal. Exhibits no distension and no mass. There is no tenderness.  Musculoskeletal: Normal range of motion. Exhibits no edema.  Lymphadenopathy:    No cervical adenopathy.  Neurological: Alert and oriented to person, place, and time. Exhibits normal muscle tone. Gait normal. Coordination normal.  Skin: Skin is warm and dry. No rash noted. Not diaphoretic. No erythema. No pallor.  Psychiatric: Mood, memory and judgment normal.  Vitals reviewed.  LABORATORY DATA: Lab Results  Component Value Date   WBC 5.1 11/19/2019   HGB 12.5 (L) 11/19/2019   HCT 38.3 (L) 11/19/2019   MCV 90.1 11/19/2019   PLT 203  11/19/2019      Chemistry      Component Value Date/Time   NA 139 11/19/2019 1110   NA 140 02/08/2017 0822   K 3.8 11/19/2019 1110   K 4.5 02/08/2017 0822   CL 104 11/19/2019 1110   CO2 26 11/19/2019 1110   CO2 25 02/08/2017 0822   BUN 10 11/19/2019 1110   BUN 8.8 02/08/2017 0822   CREATININE 1.14 11/19/2019 1110   CREATININE 1.1 02/08/2017 0822      Component Value Date/Time   CALCIUM 9.4 11/19/2019 1110   CALCIUM 9.6 02/08/2017 0822   ALKPHOS 46 11/19/2019 1110   ALKPHOS 53 02/08/2017 0822   AST 18 11/19/2019 1110   AST 19 02/08/2017 0822   ALT 10 11/19/2019 1110   ALT 9 02/08/2017 0822   BILITOT <0.2 (L) 11/19/2019 1110   BILITOT <0.22 02/08/2017 0623       RADIOGRAPHIC STUDIES:  No results found.   ASSESSMENT/PLAN:  This isa very pleasant 74 year old African-American maleinitiallydiagnosed with stage IIIa non-small cell lung cancer, squamous cell carcinoma. He was diagnosed in April 2016.He presented with a right upper lobe lung mass and right hilar and mediastinal lymphadenopathy.   He isstatuspost concurrent chemoradiation followed by consolidation chemotherapy.He has beenon observation for over 3years.The patient showed evidence of disease recurrence in August 2020  He is currently undergoing treatment with carboplatin for an AUC of 5, paclitaxel 175 mg/m in combination with immunotherapy with nivolumab 360 mg IV every 3 weeks andipilimumab1 mg/kg IV every 6 weeks. The patient is status post cycle #8 day 1. Starting from cycle #2,patient has been maintenance treatment with immunotherapy with nivolumab 360 mg IV every 3 weeks andipilimumab1 mg/kg IV every 6 weeks.  Labs were reviewed. Recommend that he proceed with cycle 8 Day 22 today as scheduled.   We will see him back for a follow up visit in 3 weeks for evaluation before starting day 1 cycle #9.   The patient was advised to call immediately if he has any concerning symptoms in the  interval. The patient voices understanding of current disease status and treatment options and is in agreement with the current care plan. All questions were answered. The patient knows to call the clinic with any problems, questions or concerns. We can certainly see the patient much sooner if necessary    No orders of the defined types were placed in this encounter.    Woodfin Kiss L Menucha Dicesare, PA-C 11/19/19

## 2019-11-19 ENCOUNTER — Inpatient Hospital Stay: Payer: Medicare Other

## 2019-11-19 ENCOUNTER — Other Ambulatory Visit: Payer: Self-pay

## 2019-11-19 ENCOUNTER — Inpatient Hospital Stay: Payer: Medicare Other | Attending: Internal Medicine | Admitting: Physician Assistant

## 2019-11-19 VITALS — BP 129/68 | HR 97 | Temp 98.6°F | Resp 18 | Wt 155.5 lb

## 2019-11-19 DIAGNOSIS — J449 Chronic obstructive pulmonary disease, unspecified: Secondary | ICD-10-CM

## 2019-11-19 DIAGNOSIS — Z5112 Encounter for antineoplastic immunotherapy: Secondary | ICD-10-CM | POA: Diagnosis not present

## 2019-11-19 DIAGNOSIS — C3411 Malignant neoplasm of upper lobe, right bronchus or lung: Secondary | ICD-10-CM | POA: Diagnosis not present

## 2019-11-19 DIAGNOSIS — C3491 Malignant neoplasm of unspecified part of right bronchus or lung: Secondary | ICD-10-CM | POA: Diagnosis not present

## 2019-11-19 DIAGNOSIS — Z79899 Other long term (current) drug therapy: Secondary | ICD-10-CM | POA: Insufficient documentation

## 2019-11-19 DIAGNOSIS — R5383 Other fatigue: Secondary | ICD-10-CM

## 2019-11-19 LAB — CMP (CANCER CENTER ONLY)
ALT: 10 U/L (ref 0–44)
AST: 18 U/L (ref 15–41)
Albumin: 3.7 g/dL (ref 3.5–5.0)
Alkaline Phosphatase: 46 U/L (ref 38–126)
Anion gap: 9 (ref 5–15)
BUN: 10 mg/dL (ref 8–23)
CO2: 26 mmol/L (ref 22–32)
Calcium: 9.4 mg/dL (ref 8.9–10.3)
Chloride: 104 mmol/L (ref 98–111)
Creatinine: 1.14 mg/dL (ref 0.61–1.24)
GFR, Est AFR Am: >60 mL/min (ref >60)
GFR, Estimated: >60 mL/min (ref >60)
Glucose, Bld: 87 mg/dL (ref 70–99)
Potassium: 3.8 mmol/L (ref 3.5–5.1)
Sodium: 139 mmol/L (ref 135–145)
Total Bilirubin: <0.2 mg/dL — ABNORMAL LOW (ref 0.3–1.2)
Total Protein: 7.6 g/dL (ref 6.5–8.1)

## 2019-11-19 LAB — CBC WITH DIFFERENTIAL (CANCER CENTER ONLY)
Abs Immature Granulocytes: 0.01 10*3/uL (ref 0.00–0.07)
Basophils Absolute: 0 10*3/uL (ref 0.0–0.1)
Basophils Relative: 0 %
Eosinophils Absolute: 0.1 10*3/uL (ref 0.0–0.5)
Eosinophils Relative: 1 %
HCT: 38.3 % — ABNORMAL LOW (ref 39.0–52.0)
Hemoglobin: 12.5 g/dL — ABNORMAL LOW (ref 13.0–17.0)
Immature Granulocytes: 0 %
Lymphocytes Relative: 31 %
Lymphs Abs: 1.5 10*3/uL (ref 0.7–4.0)
MCH: 29.4 pg (ref 26.0–34.0)
MCHC: 32.6 g/dL (ref 30.0–36.0)
MCV: 90.1 fL (ref 80.0–100.0)
Monocytes Absolute: 0.5 10*3/uL (ref 0.1–1.0)
Monocytes Relative: 10 %
Neutro Abs: 2.9 10*3/uL (ref 1.7–7.7)
Neutrophils Relative %: 58 %
Platelet Count: 203 10*3/uL (ref 150–400)
RBC: 4.25 MIL/uL (ref 4.22–5.81)
RDW: 15.7 % — ABNORMAL HIGH (ref 11.5–15.5)
WBC Count: 5.1 10*3/uL (ref 4.0–10.5)
nRBC: 0 % (ref 0.0–0.2)

## 2019-11-19 LAB — TSH: TSH: 2.621 u[IU]/mL (ref 0.320–4.118)

## 2019-11-19 MED ORDER — SODIUM CHLORIDE 0.9 % IV SOLN
360.0000 mg | Freq: Once | INTRAVENOUS | Status: AC
  Administered 2019-11-19: 360 mg via INTRAVENOUS
  Filled 2019-11-19: qty 16

## 2019-11-19 MED ORDER — SODIUM CHLORIDE 0.9 % IV SOLN
Freq: Once | INTRAVENOUS | Status: AC
  Filled 2019-11-19: qty 250

## 2019-11-19 MED ORDER — HEPARIN SOD (PORK) LOCK FLUSH 100 UNIT/ML IV SOLN
500.0000 [IU] | Freq: Once | INTRAVENOUS | Status: AC | PRN
  Administered 2019-11-19: 500 [IU]
  Filled 2019-11-19: qty 5

## 2019-11-19 MED ORDER — SODIUM CHLORIDE 0.9% FLUSH
10.0000 mL | INTRAVENOUS | Status: DC | PRN
  Administered 2019-11-19: 10 mL
  Filled 2019-11-19: qty 10

## 2019-11-19 NOTE — Patient Instructions (Signed)
[facility] Discharge Instructions for Patients Receiving Chemotherapy  Today you received the following chemotherapy agents Nivolumab (OP[place]O),  To help prevent nausea and vomiting after your treatment, we encourage you to take your nausea medication as prescribed.  If you develop nausea and vomiting that is not controlled by your nausea medication, call the clinic.   BELOW ARE SYMPTOMS THAT SHOULD BE REPORTED IMMEDIATELY:  *FEVER GREATER THAN 100.5 F  *CHILLS WITH OR WITHOUT FEVER  NAUSEA AND VOMITING THAT IS NOT CONTROLLED WITH YOUR NAUSEA MEDICATION  *UNUSUAL SHORTNESS OF BREATH  *UNUSUAL BRUISING OR BLEEDING  TENDERNESS IN MOUTH AND THROAT WITH OR WITHOUT PRESENCE OF ULCERS  *URINARY PROBLEMS  *BOWEL PROBLEMS  UNUSUAL RASH Items with * indicate a potential emergency and should be followed up as soon as possible.  Feel free to call the clinic should you have any questions or concerns. The clinic phone number is [phone].  Please show the [facility] at check-in to the Emergency Department and triage nurse.

## 2019-11-20 ENCOUNTER — Telehealth: Payer: Self-pay | Admitting: Physician Assistant

## 2019-11-20 DIAGNOSIS — Z0001 Encounter for general adult medical examination with abnormal findings: Secondary | ICD-10-CM | POA: Diagnosis not present

## 2019-11-20 NOTE — Telephone Encounter (Signed)
Scheduled per los. Called and left msg. Mailed printout  °

## 2019-11-21 DIAGNOSIS — Z125 Encounter for screening for malignant neoplasm of prostate: Secondary | ICD-10-CM | POA: Diagnosis not present

## 2019-11-21 DIAGNOSIS — Z139 Encounter for screening, unspecified: Secondary | ICD-10-CM | POA: Diagnosis not present

## 2019-11-21 DIAGNOSIS — Z0000 Encounter for general adult medical examination without abnormal findings: Secondary | ICD-10-CM | POA: Diagnosis not present

## 2019-11-21 DIAGNOSIS — E039 Hypothyroidism, unspecified: Secondary | ICD-10-CM | POA: Diagnosis not present

## 2019-11-21 DIAGNOSIS — R7309 Other abnormal glucose: Secondary | ICD-10-CM | POA: Diagnosis not present

## 2019-12-09 ENCOUNTER — Other Ambulatory Visit: Payer: Self-pay

## 2019-12-09 ENCOUNTER — Ambulatory Visit (INDEPENDENT_AMBULATORY_CARE_PROVIDER_SITE_OTHER): Payer: Medicare Other

## 2019-12-09 ENCOUNTER — Encounter: Payer: Self-pay | Admitting: Internal Medicine

## 2019-12-09 ENCOUNTER — Ambulatory Visit (INDEPENDENT_AMBULATORY_CARE_PROVIDER_SITE_OTHER): Payer: Medicare Other | Admitting: Internal Medicine

## 2019-12-09 DIAGNOSIS — R0602 Shortness of breath: Secondary | ICD-10-CM | POA: Diagnosis not present

## 2019-12-09 DIAGNOSIS — J449 Chronic obstructive pulmonary disease, unspecified: Secondary | ICD-10-CM | POA: Diagnosis not present

## 2019-12-09 DIAGNOSIS — C3490 Malignant neoplasm of unspecified part of unspecified bronchus or lung: Secondary | ICD-10-CM | POA: Diagnosis not present

## 2019-12-09 NOTE — Assessment & Plan Note (Addendum)
Quit smoking 01/2016 PFT  06/13/16:   FEV1 1.75 (57%) FEV1/FVC is 0.64   negative bronchodilator response ? On prior rx    DLCO corrected 38% - 04/01/2018  After extensive coaching inhaler device,  effectiveness =    90% with elipta from baseline of 50%  -  10/07/2018  After extensive coaching inhaler device,  effectiveness =    90%  -  10/07/2018   Walked RA  2 laps @ approx 227ft each @ slow pace  stopped due to end of study, no desats, no sob  - PFT's  04/11/2019  FEV1 1.85 (62 % ) ratio 0.72  p 0 % improvement from saba p 0 prior to study with DLCO  10.49 (39%) corrects to 2.51 (62%)  for alv volume and FV curve mild curvature  -  12/09/2019   Walked RA  2 laps @ approx 270ft each @ slow pace  stopped due to end of study,  no sob sats still 100% p am anoro  Pt is Group B in terms of symptom/risk and laba/lama therefore appropriate rx at this point >>>  anoro still appropriate  - The proper method of use, as well as anticipated side effects, of an elipta inhaler are discussed and demonstrated to the patient and his sister.   Strongly rec more regular paced walking   >>> f/u in 3 months with pfts           Each maintenance medication was reviewed in detail including emphasizing most importantly the difference between maintenance and prns and under what circumstances the prns are to be triggered using an action plan format where appropriate.  Total time for H and P, chart review, counseling with sister who is caretaker , teaching device /  directly observing portions of ambulatory 02 saturation study/  and generating customized AVS unique to this office visit / charting = 30 min

## 2019-12-09 NOTE — Progress Notes (Signed)
Called and spoke with Jetty Duhamel (sister) per [facility] about xray results per Dr. Melvyn Novas. All questions answered and she expressed understanding. Nothing further needed at this time.

## 2019-12-09 NOTE — Patient Instructions (Signed)
Try to get out and do more walking at a slower pace    Please remember to go to the  x-ray department  for your tests - we will call you with the results when they are available     Please schedule a follow up visit in 3 months with pfts on return with NO ANORO that day  but call sooner if needed

## 2019-12-09 NOTE — Progress Notes (Signed)
Patient ID: Ivan Daniels, male    DOB: 1946-04-08,    MRN: [id number]   Subjective:   Patient ID: Ivan Daniels, male    DOB: 03-Mar-1946,    MRN: [id number]   Brief patient profile:  22 yobm  MM quit smoking 01/2016  with GOLD II copd main to lama/laba prev under care of Dr Rhea Pink with IIIa [place] chemo/RT with f/u by [facility]   Entry 12/19/17  DIAGNOSIS: Stage IIIA (T2a, N2, M0) non-small cell lung cancer consistent with invasive squamous cell carcinoma presented with right upper lobe lung mass in addition to right hilar and mediastinal lymphadenopathy diagnosed in September 2016.  PRIOR THERAPY:  1) Course of concurrent chemoradiation with weekly carboplatin for AUC of 2 and paclitaxel 45 MG/M2. First dose 03/01/2015. He is status post 6 cycles. Last cycle was given 04/12/2015 with partial response. 2) Consolidation chemotherapy with reduced dose carboplatin for AUC of 5 and paclitaxel 175 MG/M2 every 3 weeks, status post 3 cycles.  CURRENT THERAPY: Observation   History of Present Illness 04/01/2018  Transition of care/ Ivan Daniels  GOLD II/ anoro maint rx  Chief Complaint  Patient presents with  . Follow-up    Former patient of Dr Ashok Cordia. He states breathing is "alright".  He is using anoro and does not have a rescue inhaler. Spouse reports he coughs in the night.   Dyspnea:  [employer] slow pace/ pushing buggy/ [employer] parking/ landing s stopping MMRC3 = can't walk 100 yards even at a slow pace at a flat grade s stopping due to sob   Cough: dry / cough does not bother him or wake him at night  Sleeping: 1-2 pillows, bed flat  SABA use: none 02: none  rec F/u in 6 m     10/07/2018  f/u ov/Ivan Daniels re: turns out to be GOLD 0/ Group B  On anoro maint / no aecopds Chief Complaint  Patient presents with  . Follow-up    Breathing is doing well and no new co's.   Dyspnea:  [employer] ok once a week o/w very sedentary / gets let off at door and uses hc parking = MMRC3 = can't walk 100 yards  even at a slow pace at a flat grade s stopping due to sob   Cough: sporadic, not in am /non-productive Sleeping: ok flat  rec Continue to use anoro one click each am    38/05/173  f/u ov/Ivan Daniels re: GOLD0 / anoro  Chief Complaint  Patient presents with  . Follow-up    PFT done today. Breathing is unchanged since the last visit.   Dyspnea:  MMRC3 = can't walk 100 yards even at a slow pace at a flat grade s stopping due to sob   Cough: no  Sleeping: flat  SABA use: none  02: none  rec F/u prn     12/09/2019  f/u ov/Ivan Daniels re:  GOLD 0 / Group B  On anoro  Chief Complaint  Patient presents with  . Acute Visit    Increased DOE x 2 wks. He sometimes gets winded just walking across the room. He also c/o wheezing.   Dyspnea:  Still pushing the cart at [employer] on anoro each am is about all he can do  Cough: none  Sleeping: flat / one pillow  SABA use: none  02: none    No obvious day to day or daytime variability or assoc excess/ purulent sputum or mucus plugs or hemoptysis or cp or  chest tightness,  or overt sinus or hb symptoms.   sleeping without nocturnal  or early am exacerbation  of respiratory  c/o's or need for noct saba. Also denies any obvious fluctuation of symptoms with weather or environmental changes or other aggravating or alleviating factors except as outlined above   No unusual exposure hx or h/o childhood pna/ asthma or knowledge of premature birth.  Current Allergies, Complete Past Medical History, Past Surgical History, Family History, and Social History were reviewed in [employer] record.  ROS  The following are not active complaints unless bolded Hoarseness, sore throat, dysphagia, dental problems, itching, sneezing,  nasal congestion or discharge of excess mucus or purulent secretions, ear ache,   fever, chills, sweats, unintended wt loss or wt gain, classically pleuritic or exertional cp,  orthopnea pnd or arm/hand swelling  or leg swelling,  presyncope, palpitations, abdominal pain, anorexia, nausea, vomiting, diarrhea  or change in bowel habits or change in bladder habits, change in stools or change in urine, dysuria, hematuria,  rash, arthralgias, visual complaints, headache, numbness, weakness or ataxia or problems with walking or coordination,  change in mood or  memory.        Current Meds  Medication Sig  . ALPHAGAN P 0.1 % SOLN Place 1 drop into both eyes 3 (three) times daily.  Marland Kitchen aspirin EC 81 MG tablet Take 81 mg by mouth 2 (two) times daily.  . chlorhexidine (PERIDEX) 0.12 % solution 1 mL by Mouth Rinse route as needed.   . clopidogrel (PLAVIX) 75 MG tablet Take 75 mg by mouth daily.   . ferrous fumarate (HEMOCYTE - 106 MG FE) 325 (106 FE) MG TABS tablet Take 1 tablet by mouth daily.   . haloperidol (HALDOL) 5 MG tablet Take 5 mg by mouth at bedtime.   . Multiple Vitamins-Minerals (MULTIVITAMIN WITH MINERALS) tablet Take 1 tablet by mouth daily.  . NONFORMULARY OR COMPOUNDED [place]  Combination Pain Cream -  Baclofen 2%, Doxepin 5%, Gabapentin 6%, Topiramate 2%, Pentoxifylline 3% Apply 1-2 grams to affected area 3-4 times daily Qty. 120 gm 3 refills  . ondansetron (ZOFRAN) 8 MG tablet Take 1 tablet (8 mg total) by mouth every 8 (eight) hours as needed for nausea or vomiting.  . simvastatin (ZOCOR) 20 MG tablet Take 20 mg by mouth at bedtime.   Marland Kitchen umeclidinium-vilanterol (ANORO ELLIPTA) 62.5-25 MCG/INH AEPB Inhale 1 puff into the lungs daily.  Marland Kitchen VYZULTA 0.024 % SOLN                    Objective:   Physical Exam   amb bm lets his sister answer all his questions   12/09/2019        154  04/11/2019       146  10/07/2018          152   04/01/18 151 lb 12.8 oz (68.9 kg)  12/19/17 148 lb 6.4 oz (67.3 kg)  12/01/17 152 lb (68.9 kg)    Vital signs reviewed  12/09/2019  - Note at rest 02 sats  100% on RA - note bp 90/54 not on meds/ diuretics/denies orthostatic symptoms      HEENT: Edentulous     HEENT : pt wearing mask not removed for exam due to covid - 19 concerns.   NECK :  without JVD/Nodes/TM/ nl carotid upstrokes bilaterally   LUNGS: no acc muscle use,  Min barrel  contour chest wall with bilateral  slightly decreased  bs s audible wheeze and  without cough on insp or exp maneuvers and min  Hyperresonant  to  percussion bilaterally     CV:  RRR  no s3 or murmur or increase in P2, and no edema   ABD:  soft and nontender with pos end  insp Hoover's  in the supine position. No bruits or organomegaly appreciated, bowel sounds nl  MS:   Nl gait/  ext warm without deformities, calf tenderness, cyanosis or clubbing No obvious joint restrictions   SKIN: warm and dry without lesions    NEURO:  alert, approp, nl sensorium with  no motor or cerebellar deficits apparent.       CXR PA and Lateral:   12/09/2019 :    I personally reviewed images and agree with radiology impression as follows:   Changes consistent with the known history of right lung carcinoma with some posterior radiation scarring. (post RUL)     PFT 06/13/16: FVC 2.74 L (68%) FEV1 1.75 (57%) FEV1/FVC is 0.64 FEF 25-75 1.00) 3%) negative bronchodilator response TLC 4.44 L (61%) RV 76% ERV 122% DLCO corrected 38%  6MWT 06/15/16:  Walked 222 meters / Baseline Sat 98% on RA / Nadir Sat 97% on RA 2 minutes after walk  IMAGING CT CHEST W/ CONTRAST 02/08/17 (personally reviewed by me):  Right perihilar and upper lobe opacity with consolidation consistent with postradiation fibrosis. This is not significantly changed with comparison to prior CT scans. No new nodule or opacity appreciated. No pleural effusion or thickening. No pericardial effusion. No pathologic mediastinal adenopathy. Volume loss on the right appears chronic with fibrotic changes.   ESOPHAGRAM/BARIUM SWALLOW 05/19/16  :  Smoothly tapered stricture of the proximal esophagus at the level of the thoracic inlet preventing passage of barium tablet. Moderate  esophageal dysmotility.  CT CHEST W/ CONTRAST 02/28/16 Atypical predominant emphysema & subpleural bleb formation. Right hilar consolidation suggesting fibrotic change with continued stability. Hyperexpansion of left lung volume loss on the right. No new nodule or opacity appreciated. No pathologic mediastinal adenopathy. No pleural effusion or thickening. No pericardial effusion.  PATHOLOGY Right Upper Lobe Bronchial Brush (01/27/15):  Squamous Cell Carcinoma Right Upper Lobe Endobronchial Biopsy (01/27/15):  Squamous Cell Carcinoma EBUS Level 7 FNA (01/27/15):  Squamous Cell Carcinoma        Assessment & Plan:

## 2019-12-11 ENCOUNTER — Other Ambulatory Visit: Payer: Self-pay

## 2019-12-11 ENCOUNTER — Inpatient Hospital Stay: Payer: Medicare Other | Attending: Internal Medicine | Admitting: Internal Medicine

## 2019-12-11 ENCOUNTER — Inpatient Hospital Stay: Payer: Medicare Other

## 2019-12-11 ENCOUNTER — Encounter: Payer: Self-pay | Admitting: Internal Medicine

## 2019-12-11 VITALS — BP 136/72 | HR 102 | Temp 97.8°F | Resp 18 | Ht 71.0 in | Wt 152.6 lb

## 2019-12-11 VITALS — HR 96

## 2019-12-11 DIAGNOSIS — Z79899 Other long term (current) drug therapy: Secondary | ICD-10-CM | POA: Insufficient documentation

## 2019-12-11 DIAGNOSIS — C3411 Malignant neoplasm of upper lobe, right bronchus or lung: Secondary | ICD-10-CM | POA: Diagnosis not present

## 2019-12-11 DIAGNOSIS — Z5112 Encounter for antineoplastic immunotherapy: Secondary | ICD-10-CM

## 2019-12-11 DIAGNOSIS — C3490 Malignant neoplasm of unspecified part of unspecified bronchus or lung: Secondary | ICD-10-CM | POA: Diagnosis not present

## 2019-12-11 DIAGNOSIS — C3491 Malignant neoplasm of unspecified part of right bronchus or lung: Secondary | ICD-10-CM

## 2019-12-11 DIAGNOSIS — J449 Chronic obstructive pulmonary disease, unspecified: Secondary | ICD-10-CM

## 2019-12-11 DIAGNOSIS — R5383 Other fatigue: Secondary | ICD-10-CM

## 2019-12-11 LAB — CBC WITH DIFFERENTIAL (CANCER CENTER ONLY)
Abs Immature Granulocytes: 0.01 10*3/uL (ref 0.00–0.07)
Basophils Absolute: 0 10*3/uL (ref 0.0–0.1)
Basophils Relative: 0 %
Eosinophils Absolute: 0 10*3/uL (ref 0.0–0.5)
Eosinophils Relative: 1 %
HCT: 39 % (ref 39.0–52.0)
Hemoglobin: 12.7 g/dL — ABNORMAL LOW (ref 13.0–17.0)
Immature Granulocytes: 0 %
Lymphocytes Relative: 27 %
Lymphs Abs: 1.3 10*3/uL (ref 0.7–4.0)
MCH: 28.5 pg (ref 26.0–34.0)
MCHC: 32.6 g/dL (ref 30.0–36.0)
MCV: 87.6 fL (ref 80.0–100.0)
Monocytes Absolute: 0.5 10*3/uL (ref 0.1–1.0)
Monocytes Relative: 10 %
Neutro Abs: 3.1 10*3/uL (ref 1.7–7.7)
Neutrophils Relative %: 62 %
Platelet Count: 212 10*3/uL (ref 150–400)
RBC: 4.45 MIL/uL (ref 4.22–5.81)
RDW: 15.7 % — ABNORMAL HIGH (ref 11.5–15.5)
WBC Count: 4.9 10*3/uL (ref 4.0–10.5)
nRBC: 0 % (ref 0.0–0.2)

## 2019-12-11 LAB — TSH: TSH: 2.414 u[IU]/mL (ref 0.320–4.118)

## 2019-12-11 LAB — CMP (CANCER CENTER ONLY)
ALT: 9 U/L (ref 0–44)
AST: 19 U/L (ref 15–41)
Albumin: 3.9 g/dL (ref 3.5–5.0)
Alkaline Phosphatase: 54 U/L (ref 38–126)
Anion gap: 9 (ref 5–15)
BUN: 10 mg/dL (ref 8–23)
CO2: 27 mmol/L (ref 22–32)
Calcium: 9.9 mg/dL (ref 8.9–10.3)
Chloride: 104 mmol/L (ref 98–111)
Creatinine: 1.18 mg/dL (ref 0.61–1.24)
GFR, Est AFR Am: >60 mL/min (ref >60)
GFR, Estimated: >60 mL/min (ref >60)
Glucose, Bld: 97 mg/dL (ref 70–99)
Potassium: 3.7 mmol/L (ref 3.5–5.1)
Sodium: 140 mmol/L (ref 135–145)
Total Bilirubin: 0.3 mg/dL (ref 0.3–1.2)
Total Protein: 7.8 g/dL (ref 6.5–8.1)

## 2019-12-11 MED ORDER — HEPARIN SOD (PORK) LOCK FLUSH 100 UNIT/ML IV SOLN
500.0000 [IU] | Freq: Once | INTRAVENOUS | Status: AC | PRN
  Administered 2019-12-11: 500 [IU]
  Filled 2019-12-11: qty 5

## 2019-12-11 MED ORDER — DIPHENHYDRAMINE HCL 50 MG/ML IJ SOLN
INTRAMUSCULAR | Status: AC
  Filled 2019-12-11: qty 1

## 2019-12-11 MED ORDER — SODIUM CHLORIDE 0.9 % IV SOLN
Freq: Once | INTRAVENOUS | Status: AC
  Filled 2019-12-11: qty 250

## 2019-12-11 MED ORDER — FAMOTIDINE IN NACL 20-0.9 MG/50ML-% IV SOLN
20.0000 mg | Freq: Once | INTRAVENOUS | Status: AC
  Administered 2019-12-11: 20 mg via INTRAVENOUS

## 2019-12-11 MED ORDER — DIPHENHYDRAMINE HCL 50 MG/ML IJ SOLN
25.0000 mg | Freq: Once | INTRAMUSCULAR | Status: AC
  Administered 2019-12-11: 25 mg via INTRAVENOUS

## 2019-12-11 MED ORDER — SODIUM CHLORIDE 0.9% FLUSH
10.0000 mL | INTRAVENOUS | Status: DC | PRN
  Administered 2019-12-11: 10 mL
  Filled 2019-12-11: qty 10

## 2019-12-11 MED ORDER — SODIUM CHLORIDE 0.9 % IV SOLN
1.0000 mg/kg | Freq: Once | INTRAVENOUS | Status: AC
  Administered 2019-12-11: 70 mg via INTRAVENOUS
  Filled 2019-12-11: qty 14

## 2019-12-11 MED ORDER — FAMOTIDINE IN NACL 20-0.9 MG/50ML-% IV SOLN
INTRAVENOUS | Status: AC
  Filled 2019-12-11: qty 50

## 2019-12-11 MED ORDER — SODIUM CHLORIDE 0.9 % IV SOLN
360.0000 mg | Freq: Once | INTRAVENOUS | Status: AC
  Administered 2019-12-11: 360 mg via INTRAVENOUS
  Filled 2019-12-11: qty 24

## 2019-12-11 NOTE — Progress Notes (Signed)
[facility] [phone]) [phone]   Fax:([phone]) [phone]  OFFICE PROGRESS NOTE  [name], FNP [address] [place] [address]  DIAGNOSIS: Recurrent non-small cell lung cancer initially diagnosed as stage IIIA (T2a, N2, M0) non-small cell lung cancer consistent with invasive squamous cell carcinoma presented with right upper lobe lung mass in addition to right hilar and mediastinal lymphadenopathy diagnosed in September 2016.  He has disease recurrence in August 2020.  PRIOR THERAPY:  1) Course of concurrent chemoradiation with weekly carboplatin for AUC of 2 and paclitaxel 45 MG/M2. First dose 03/01/2015. He is status post 6 cycles. Last cycle was given 04/12/2015 with partial response. 2) Consolidation chemotherapy with reduced dose carboplatin for AUC of 5 and paclitaxel 175 MG/M2 every 3 weeks, status post 3 cycles.  CURRENT THERAPY: Systemic chemotherapy with carboplatin for AUC of 5, paclitaxel 175 mg/M2 with nivolumab 360 mg IV every 3 weeks in addition to ipilimumab 1 mg/KG every 6 weeks.  First dose 01/08/2019.  Status post 8 cycles.  Starting from cycle #3 the patient will be on maintenance treatment with ipilimumab every 6 weeks and nivolumab every 3 weeks.  INTERVAL HISTORY: Ivan Daniels 74 y.o. male returns to the clinic today for follow-up visit accompanied by his sister.  The patient is feeling fine today with no concerning complaints.  He denied having any chest pain, shortness of breath, cough or hemoptysis.  He denied having any fever or chills.  He has no nausea, vomiting, diarrhea or constipation.  He has no headache or visual changes.  He has no recent weight loss or night sweats.  He continues to tolerate his treatment with immunotherapy fairly well.  The patient is here today for evaluation before starting cycle #9.  MEDICAL HISTORY: Past Medical History:  Diagnosis Date  . ALCOHOL ABUSE 11/12/2009   Qualifier: Diagnosis of  By:  Hassell Done FNP, Tori Milks    . Allergy   . Anemia   . Anxiety   . BENIGN PROSTATIC HYPERTROPHY, HX OF 12/19/2006   Qualifier: Diagnosis of  By: Radene Ou MD, [place]    . Blood transfusion without reported diagnosis 2017  . Clotting disorder ([place])    takes PLAVIX  . Depression   . Emphysema of lung ([place])   . Full code status 02/10/2015  . GERD (gastroesophageal reflux disease)   . Hyperlipidemia   . Lung mass 01/25/2015   3.7 x 3.6 cm RUL spiculated lung mass with 2.0 cm right paratracheal lymph node suspicious for bronchogenic CA  . Non-small cell lung cancer ([place]) 01/27/15   non small-cell,squamous cell ca RUL  . Paranoid schizophrenia ([place]) 10/31/2009   Qualifier: Diagnosis of  By: Jorene Minors, [place]    . Primary spontaneous pneumothorax, left 01/25/2015  . TOBACCO ABUSE 05/24/2009   Qualifier: Diagnosis of  By: Hassell Done FNP, [place]      ALLERGIES:  is allergic to benztropine mesylate and chlorpromazine hcl.  MEDICATIONS:  Current Outpatient Medications  Medication Sig Dispense Refill  . ALPHAGAN P 0.1 % SOLN Place 1 drop into both eyes 3 (three) times daily.  6  . aspirin EC 81 MG tablet Take 81 mg by mouth 2 (two) times daily.    . chlorhexidine (PERIDEX) 0.12 % solution 1 mL by Mouth Rinse route as needed.   1  . clopidogrel (PLAVIX) 75 MG tablet Take 75 mg by mouth daily.     . ferrous fumarate (HEMOCYTE - 106 MG FE) 325 (106  FE) MG TABS tablet Take 1 tablet by mouth daily.     . haloperidol (HALDOL) 5 MG tablet Take 5 mg by mouth at bedtime.     . Multiple Vitamins-Minerals (MULTIVITAMIN WITH MINERALS) tablet Take 1 tablet by mouth daily.    . NONFORMULARY OR COMPOUNDED [place]  Combination Pain Cream -  Baclofen 2%, Doxepin 5%, Gabapentin 6%, Topiramate 2%, Pentoxifylline 3% Apply 1-2 grams to affected area 3-4 times daily Qty. 120 gm 3 refills 1 each 3  . ondansetron (ZOFRAN) 8 MG tablet Take 1 tablet (8 mg total) by mouth every 8 (eight) hours as needed for  nausea or vomiting. 20 tablet 0  . simvastatin (ZOCOR) 20 MG tablet Take 20 mg by mouth at bedtime.     Marland Kitchen umeclidinium-vilanterol (ANORO ELLIPTA) 62.5-25 MCG/INH AEPB Inhale 1 puff into the lungs daily. 1 each 11  . VYZULTA 0.024 % SOLN      Current Facility-Administered Medications  Medication Dose Route Frequency Provider Last Rate Last Admin  . 0.9 %  sodium chloride infusion  500 mL Intravenous Continuous Irene Shipper, MD      . 0.9 %  sodium chloride infusion  500 mL Intravenous Continuous Irene Shipper, MD      . 0.9 %  sodium chloride infusion  500 mL Intravenous Continuous Irene Shipper, MD        SURGICAL HISTORY:  Past Surgical History:  Procedure Laterality Date  . APPENDECTOMY    . CHEST TUBE INSERTION Left   . COLONOSCOPY    . INSERTION CENTRAL VENOUS ACCESS DEVICE W/ SUBCUTANEOUS PORT Right   . LUNG BIOPSY Right 01/27/2015   Procedure: Right Upper Lobe Bronchus BIOPSY;  Surgeon: Grace Isaac, MD;  Location: [facility];  Service: Thoracic;  Laterality: Right;  Marland Kitchen VIDEO BRONCHOSCOPY WITH ENDOBRONCHIAL ULTRASOUND N/A 01/27/2015   Procedure: VIDEO BRONCHOSCOPY WITH ENDOBRONCHIAL ULTRASOUND;  Surgeon: Grace Isaac, MD;  Location: [facility];  Service: Thoracic;  Laterality: N/A;    REVIEW OF SYSTEMS:  A comprehensive review of systems was negative except for: Constitutional: positive for fatigue   PHYSICAL EXAMINATION: General appearance: alert, cooperative, fatigued and no distress Head: Normocephalic, without obvious abnormality, atraumatic Neck: no adenopathy, no JVD, supple, symmetrical, trachea midline and thyroid not enlarged, symmetric, no tenderness/mass/nodules Lymph nodes: Cervical, supraclavicular, and axillary nodes normal. Resp: clear to auscultation bilaterally Back: symmetric, no curvature. ROM normal. No CVA tenderness. Cardio: regular rate and rhythm, S1, S2 normal, no murmur, click, rub or gallop GI: soft, non-tender; bowel sounds normal; no masses,  no  organomegaly Extremities: extremities normal, atraumatic, no cyanosis or edema  ECOG PERFORMANCE STATUS: 1 - Symptomatic but completely ambulatory  Blood pressure 136/72, pulse (!) 102, temperature 97.8 F (36.6 C), temperature source Tympanic, resp. rate 18, height 5\' 11"  (1.803 m), weight 152 lb 9.6 oz (69.2 kg), SpO2 100 %.  LABORATORY DATA: Lab Results  Component Value Date   WBC 5.1 11/19/2019   HGB 12.5 (L) 11/19/2019   HCT 38.3 (L) 11/19/2019   MCV 90.1 11/19/2019   PLT 203 11/19/2019      Chemistry      Component Value Date/Time   NA 139 11/19/2019 1110   NA 140 02/08/2017 0822   K 3.8 11/19/2019 1110   K 4.5 02/08/2017 0822   CL 104 11/19/2019 1110   CO2 26 11/19/2019 1110   CO2 25 02/08/2017 0822   BUN 10 11/19/2019 1110   BUN 8.8 02/08/2017 2778  CREATININE 1.14 11/19/2019 1110   CREATININE 1.1 02/08/2017 0822      Component Value Date/Time   CALCIUM 9.4 11/19/2019 1110   CALCIUM 9.6 02/08/2017 0822   ALKPHOS 46 11/19/2019 1110   ALKPHOS 53 02/08/2017 0822   AST 18 11/19/2019 1110   AST 19 02/08/2017 0822   ALT 10 11/19/2019 1110   ALT 9 02/08/2017 0822   BILITOT <0.2 (L) 11/19/2019 1110   BILITOT <0.22 02/08/2017 1610       RADIOGRAPHIC STUDIES: DG Chest 2 View  Result Date: 12/09/2019 CLINICAL DATA:  Shortness of breath EXAM: CHEST - 2 VIEW COMPARISON:  10/03/2019 PET-CT FINDINGS: Cardiac shadows within normal limits. The left lung is clear. Right lung demonstrates suprahilar scarring and retraction consistent with the given clinical history of lung cancer and post radiation scarring. Recent PET-CT shows hypermetabolic uptake in this region. No acute infiltrate is seen. No sizable effusion is noted. No bony abnormality is seen. Nipple shadows are noted bilaterally. IMPRESSION: Changes consistent with the known history of right lung carcinoma with some posterior radiation scarring. Electronically Signed   By: Inez Catalina M.D.   On: 12/09/2019 15:37     ASSESSMENT AND PLAN:  This is a very pleasant 74 years old African-American male with recurrent non-small cell lung cancer initially diagnosed as stage IIIA non-small cell lung cancer, squamous cell carcinoma status post concurrent chemoradiation followed by consolidation chemotherapy. The patient has been in observation for close to 3 years. He had evidence for disease recurrence on the recent imaging studies including CT scan of the chest as well as PET scan. The patient is started  treatment with systemic chemotherapy with carboplatin for AUC of 5, paclitaxel 175 mg/M2 as well as combination of immunotherapy with nivolumab 360 mg IV every 3 weeks and ipilimumab 1 mg/KG every 6 weeks.  The chemotherapy will be given only for 2 cycles and then the patient will be maintained on immunotherapy with nivolumab and ipilimumab for a total of 2 years if he has no evidence for disease progression or unacceptable toxicity.  He is status post 8 cycles of treatment with systemic chemotherapy as well as immunotherapy with ipilimumab and nivolumab.  He continues to tolerate this treatment well with no concerning adverse effects. I recommended for the patient to proceed with cycle #9 today as planned. I will see him back for follow-up visit in 3 weeks for evaluation with repeat CT scan of the chest, abdomen pelvis for restaging of his disease. The patient was advised to call immediately if he has any concerning symptoms in the interval. The patient was advised to reach out to his psychiatrist for evaluation of the new tremor after switching his treatment to [place]. He was advised to call immediately if he has any other concerning symptoms in the interval. Disclaimer: This note was dictated with voice recognition software. Similar sounding words can inadvertently be transcribed and may not be corrected upon review.

## 2019-12-11 NOTE — Progress Notes (Signed)
Yervoy questionnaire completed, pt only answered yes to having more BM's than usual.  Is now having one per day, was having one every other day.

## 2019-12-11 NOTE — Patient Instructions (Signed)
[facility] Discharge Instructions for Patients Receiving Chemotherapy  Today you received the following chemotherapy agents: Rae Halsted   To help prevent nausea and vomiting after your treatment, we encourage you to take your nausea medication as directed.    If you develop nausea and vomiting that is not controlled by your nausea medication, call the clinic.   BELOW ARE SYMPTOMS THAT SHOULD BE REPORTED IMMEDIATELY:  *FEVER GREATER THAN 100.5 F  *CHILLS WITH OR WITHOUT FEVER  NAUSEA AND VOMITING THAT IS NOT CONTROLLED WITH YOUR NAUSEA MEDICATION  *UNUSUAL SHORTNESS OF BREATH  *UNUSUAL BRUISING OR BLEEDING  TENDERNESS IN MOUTH AND THROAT WITH OR WITHOUT PRESENCE OF ULCERS  *URINARY PROBLEMS  *BOWEL PROBLEMS  UNUSUAL RASH Items with * indicate a potential emergency and should be followed up as soon as possible.  Feel free to call the clinic should you have any questions or concerns. The clinic phone number is [phone].  Please show the [facility] at check-in to the Emergency Department and triage nurse.

## 2019-12-11 NOTE — Progress Notes (Signed)
Per Dr Julien Nordmann ,it is okay to treat pt today with Nivolumab and Yervoy and today's heart rate of 102.

## 2019-12-12 ENCOUNTER — Telehealth: Payer: Self-pay | Admitting: *Deleted

## 2019-12-12 NOTE — Telephone Encounter (Signed)
Sister called requesting direction on how patient is to drink the oral contrast for CT scan. Informed her he is NPO 4 hours before the scan and drink contrast at 6 am and 7 am. She verbalized understanding.

## 2019-12-25 DIAGNOSIS — F209 Schizophrenia, unspecified: Secondary | ICD-10-CM | POA: Diagnosis not present

## 2019-12-26 ENCOUNTER — Ambulatory Visit (INDEPENDENT_AMBULATORY_CARE_PROVIDER_SITE_OTHER): Payer: Medicare Other | Admitting: Podiatry

## 2019-12-26 ENCOUNTER — Encounter: Payer: Self-pay | Admitting: Podiatry

## 2019-12-26 ENCOUNTER — Other Ambulatory Visit: Payer: Self-pay

## 2019-12-26 DIAGNOSIS — D689 Coagulation defect, unspecified: Secondary | ICD-10-CM | POA: Diagnosis not present

## 2019-12-26 DIAGNOSIS — Q828 Other specified congenital malformations of skin: Secondary | ICD-10-CM | POA: Diagnosis not present

## 2019-12-26 DIAGNOSIS — S98139S Complete traumatic amputation of one unspecified lesser toe, sequela: Secondary | ICD-10-CM

## 2019-12-26 NOTE — Progress Notes (Signed)
This patient returns to my office for at risk foot care.  This patient requires this care by a professional since this patient will be at risk due to having digital amputations both feet.  He has developed painful callus on both feet following the amputations. These nails are painful walking and wearing shoes.  This patient presents for at risk foot care today.  General Appearance  Alert, conversant and in no acute stress.  Vascular  Dorsalis pedis and posterior tibial  pulses are weakly  palpable  bilaterally.  Capillary return is within normal limits  bilaterally. Temperature is within normal limits  bilaterally.  Neurologic  Senn-Weinstein monofilament wire test within normal limits  bilaterally. Muscle power within normal limits bilaterally.  Nails No nails noted  B/L.  Orthopedic  No limitations of motion  feet .  No crepitus or effusions noted.  No bony pathology or digital deformities noted. Amputated digits  B/L.  Skin  normotropic skin noted bilaterally.  No signs of infections or ulcers noted.   Porokeratosis sub 2 right foot .  Callus at site of 1,2 amputation left foot.  Porokeratosis    Pain in right toes  Pain in left toes  Consent was obtained for treatment procedures.Debridement of callus  B/L with # 15 blade.   Return office visit   10 weeks                   Told patient to return for periodic foot care and evaluation due to potential at risk complications.   Gardiner Barefoot DPM

## 2019-12-30 ENCOUNTER — Other Ambulatory Visit: Payer: Self-pay

## 2019-12-30 ENCOUNTER — Ambulatory Visit (HOSPITAL_COMMUNITY)
Admission: RE | Admit: 2019-12-30 | Discharge: 2019-12-30 | Disposition: A | Discharge status: Home / Self Care | Payer: Medicare Other | Source: Ambulatory Visit | Attending: Internal Medicine | Admitting: Internal Medicine

## 2019-12-30 ENCOUNTER — Encounter (HOSPITAL_COMMUNITY): Payer: Self-pay

## 2019-12-30 DIAGNOSIS — C3490 Malignant neoplasm of unspecified part of unspecified bronchus or lung: Secondary | ICD-10-CM

## 2019-12-30 DIAGNOSIS — I700 Atherosclerosis of aorta: Secondary | ICD-10-CM | POA: Diagnosis not present

## 2019-12-30 DIAGNOSIS — J439 Emphysema, unspecified: Secondary | ICD-10-CM | POA: Diagnosis not present

## 2019-12-30 MED ORDER — HEPARIN SOD (PORK) LOCK FLUSH 100 UNIT/ML IV SOLN: 500.0000 [IU] | Freq: Once | INTRAVENOUS | Status: AC

## 2019-12-30 MED ORDER — HEPARIN SOD (PORK) LOCK FLUSH 100 UNIT/ML IV SOLN
INTRAVENOUS | Status: AC
  Administered 2019-12-30: 500 [IU] via INTRAVENOUS
  Filled 2019-12-30: qty 5

## 2019-12-30 MED ORDER — SODIUM CHLORIDE (PF) 0.9 % IJ SOLN
INTRAMUSCULAR | Status: AC
  Filled 2019-12-30: qty 50

## 2019-12-30 MED ORDER — IOHEXOL 300 MG/ML  SOLN
100.0000 mL | Freq: Once | INTRAMUSCULAR | Status: AC | PRN
  Administered 2019-12-30: 100 mL via INTRAVENOUS

## 2019-12-31 ENCOUNTER — Encounter: Payer: Self-pay | Admitting: Internal Medicine

## 2019-12-31 ENCOUNTER — Inpatient Hospital Stay (HOSPITAL_BASED_OUTPATIENT_CLINIC_OR_DEPARTMENT_OTHER): Payer: Medicare Other | Admitting: Internal Medicine

## 2019-12-31 ENCOUNTER — Inpatient Hospital Stay: Payer: Medicare Other

## 2019-12-31 ENCOUNTER — Other Ambulatory Visit: Payer: Self-pay

## 2019-12-31 VITALS — BP 117/72 | HR 99 | Temp 97.6°F | Resp 18 | Ht 71.0 in | Wt 152.5 lb

## 2019-12-31 DIAGNOSIS — Z79899 Other long term (current) drug therapy: Secondary | ICD-10-CM

## 2019-12-31 DIAGNOSIS — C3491 Malignant neoplasm of unspecified part of right bronchus or lung: Secondary | ICD-10-CM

## 2019-12-31 DIAGNOSIS — Z5112 Encounter for antineoplastic immunotherapy: Secondary | ICD-10-CM | POA: Diagnosis not present

## 2019-12-31 DIAGNOSIS — J449 Chronic obstructive pulmonary disease, unspecified: Secondary | ICD-10-CM

## 2019-12-31 DIAGNOSIS — R251 Tremor, unspecified: Secondary | ICD-10-CM

## 2019-12-31 DIAGNOSIS — C3411 Malignant neoplasm of upper lobe, right bronchus or lung: Secondary | ICD-10-CM

## 2019-12-31 DIAGNOSIS — R5383 Other fatigue: Secondary | ICD-10-CM

## 2019-12-31 LAB — CBC WITH DIFFERENTIAL (CANCER CENTER ONLY)
Abs Immature Granulocytes: 0.01 10*3/uL (ref 0.00–0.07)
Basophils Absolute: 0 10*3/uL (ref 0.0–0.1)
Basophils Relative: 0 %
Eosinophils Absolute: 0 10*3/uL (ref 0.0–0.5)
Eosinophils Relative: 1 %
HCT: 38 % — ABNORMAL LOW (ref 39.0–52.0)
Hemoglobin: 12.3 g/dL — ABNORMAL LOW (ref 13.0–17.0)
Immature Granulocytes: 0 %
Lymphocytes Relative: 28 %
Lymphs Abs: 1.4 10*3/uL (ref 0.7–4.0)
MCH: 28.7 pg (ref 26.0–34.0)
MCHC: 32.4 g/dL (ref 30.0–36.0)
MCV: 88.8 fL (ref 80.0–100.0)
Monocytes Absolute: 0.6 10*3/uL (ref 0.1–1.0)
Monocytes Relative: 13 %
Neutro Abs: 2.9 10*3/uL (ref 1.7–7.7)
Neutrophils Relative %: 58 %
Platelet Count: 239 10*3/uL (ref 150–400)
RBC: 4.28 MIL/uL (ref 4.22–5.81)
RDW: 15.7 % — ABNORMAL HIGH (ref 11.5–15.5)
WBC Count: 5.1 10*3/uL (ref 4.0–10.5)
nRBC: 0 % (ref 0.0–0.2)

## 2019-12-31 LAB — CMP (CANCER CENTER ONLY)
ALT: 7 U/L (ref 0–44)
AST: 16 U/L (ref 15–41)
Albumin: 3.6 g/dL (ref 3.5–5.0)
Alkaline Phosphatase: 54 U/L (ref 38–126)
Anion gap: 6 (ref 5–15)
BUN: 10 mg/dL (ref 8–23)
CO2: 29 mmol/L (ref 22–32)
Calcium: 9.9 mg/dL (ref 8.9–10.3)
Chloride: 104 mmol/L (ref 98–111)
Creatinine: 1.14 mg/dL (ref 0.61–1.24)
GFR, Est AFR Am: >60 mL/min (ref >60)
GFR, Estimated: >60 mL/min (ref >60)
Glucose, Bld: 82 mg/dL (ref 70–99)
Potassium: 3.8 mmol/L (ref 3.5–5.1)
Sodium: 139 mmol/L (ref 135–145)
Total Bilirubin: 0.2 mg/dL — ABNORMAL LOW (ref 0.3–1.2)
Total Protein: 7.7 g/dL (ref 6.5–8.1)

## 2019-12-31 LAB — TSH: TSH: 2.227 u[IU]/mL (ref 0.320–4.118)

## 2019-12-31 MED ORDER — SODIUM CHLORIDE 0.9 % IV SOLN
360.0000 mg | Freq: Once | INTRAVENOUS | Status: AC
  Administered 2019-12-31: 360 mg via INTRAVENOUS
  Filled 2019-12-31: qty 12

## 2019-12-31 MED ORDER — SODIUM CHLORIDE 0.9 % IV SOLN
Freq: Once | INTRAVENOUS | Status: AC
  Filled 2019-12-31: qty 250

## 2019-12-31 MED ORDER — SODIUM CHLORIDE 0.9% FLUSH
10.0000 mL | INTRAVENOUS | Status: DC | PRN
  Administered 2019-12-31: 10 mL
  Filled 2019-12-31: qty 10

## 2019-12-31 MED ORDER — HEPARIN SOD (PORK) LOCK FLUSH 100 UNIT/ML IV SOLN
500.0000 [IU] | Freq: Once | INTRAVENOUS | Status: AC | PRN
  Administered 2019-12-31: 500 [IU]
  Filled 2019-12-31: qty 5

## 2019-12-31 NOTE — Progress Notes (Signed)
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  He has disease recurrence in August 2020.  PRIOR THERAPY:  1) Course of concurrent chemoradiation with weekly carboplatin for AUC of 2 and paclitaxel 45 MG/M2. First dose 03/01/2015. He is status post 6 cycles. Last cycle was given 04/12/2015 with partial response. 2) Consolidation chemotherapy with reduced dose carboplatin for AUC of 5 and paclitaxel 175 MG/M2 every 3 weeks, status post 3 cycles.  CURRENT THERAPY: Systemic chemotherapy with carboplatin for AUC of 5, paclitaxel 175 mg/M2 with nivolumab 360 mg IV every 3 weeks in addition to ipilimumab 1 mg/KG every 6 weeks.  First dose 01/08/2019.  Status post 9 cycles.  Starting from cycle #3 the patient will be on maintenance treatment with ipilimumab every 6 weeks and nivolumab every 3 weeks.  INTERVAL HISTORY: Ivan Daniels 74 y.o. male returns to the clinic today for follow-up visit accompanied by his sister.  The patient is feeling fine today with no concerning complaints.  He continues to tolerate his treatment with ipilimumab and nivolumab fairly well.  He denied having any current chest pain, shortness of breath, cough or hemoptysis.  He denied having any nausea, vomiting, diarrhea or constipation.  He has no headache or visual changes.  He denied having any significant weight loss or night sweats.  The patient had repeat CT scan of the chest, abdomen pelvis performed recently and he is here for evaluation and discussion of his scan results.  MEDICAL HISTORY: Past Medical  History:  Diagnosis Date   ALCOHOL ABUSE 11/12/2009   Qualifier: Diagnosis of  By: Hassell Done FNP, [place]     Allergy    Anemia    Anxiety    BENIGN PROSTATIC HYPERTROPHY, HX OF 12/19/2006   Qualifier: Diagnosis of  By: Radene Ou MD, [place]     Blood transfusion without reported diagnosis 2017   Clotting disorder ([facility])    takes PLAVIX   Depression    Emphysema of lung ([facility])    Full code status 02/10/2015   GERD (gastroesophageal reflux disease)    Hyperlipidemia    Lung mass 01/25/2015   3.7 x 3.6 cm RUL spiculated lung mass with 2.0 cm right paratracheal lymph node suspicious for bronchogenic CA   Non-small cell lung cancer ([facility]) 01/27/15   non small-cell,squamous cell ca RUL   Paranoid schizophrenia ([facility]) 10/31/2009   Qualifier: Diagnosis of  By: Jorene Minors, [place]     Primary spontaneous pneumothorax, left 01/25/2015   TOBACCO ABUSE 05/24/2009   Qualifier: Diagnosis of  By: Hassell Done FNP, Tori Milks      ALLERGIES:  is allergic to benztropine mesylate and chlorpromazine hcl.  MEDICATIONS:  Current Outpatient Medications  Medication Sig Dispense Refill   ALPHAGAN P 0.1 % SOLN Place 1 drop into both eyes 3 (three) times daily.  6   aspirin EC 81 MG tablet Take 81 mg by mouth 2 (two) times daily.     chlorhexidine (PERIDEX) 0.12 % solution 1 mL by Mouth Rinse route as needed.   1   clopidogrel (PLAVIX) 75 MG tablet Take 75 mg by mouth daily.  ferrous fumarate (HEMOCYTE - 106 MG FE) 325 (106 FE) MG TABS tablet Take 1 tablet by mouth daily.      haloperidol (HALDOL) 5 MG tablet Take 5 mg by mouth at bedtime.      Multiple Vitamins-Minerals (MULTIVITAMIN WITH MINERALS) tablet Take 1 tablet by mouth daily.     NONFORMULARY OR COMPOUNDED [place]  Combination Pain Cream -  Baclofen 2%, Doxepin 5%, Gabapentin 6%, Topiramate 2%, Pentoxifylline 3% Apply 1-2 grams to affected area 3-4 times daily Qty. 120 gm 3 refills 1 each 3   ondansetron (ZOFRAN) 8  MG tablet Take 1 tablet (8 mg total) by mouth every 8 (eight) hours as needed for nausea or vomiting. 20 tablet 0   simvastatin (ZOCOR) 20 MG tablet Take 20 mg by mouth at bedtime.      umeclidinium-vilanterol (ANORO ELLIPTA) 62.5-25 MCG/INH AEPB Inhale 1 puff into the lungs daily. 1 each 11   VYZULTA 0.024 % SOLN      Current Facility-Administered Medications  Medication Dose Route Frequency Provider Last Rate Last Admin   0.9 %  sodium chloride infusion  500 mL Intravenous Continuous Irene Shipper, MD       0.9 %  sodium chloride infusion  500 mL Intravenous Continuous Irene Shipper, MD       0.9 %  sodium chloride infusion  500 mL Intravenous Continuous Irene Shipper, MD       Facility-Administered Medications Ordered in Other Visits  Medication Dose Route Frequency Provider Last Rate Last Admin   sodium chloride flush (NS) 0.9 % injection 10 mL  10 mL Intracatheter PRN Wyatt Portela, MD   10 mL at 12/31/19 1055    SURGICAL HISTORY:  Past Surgical History:  Procedure Laterality Date   APPENDECTOMY     CHEST TUBE INSERTION Left    COLONOSCOPY     INSERTION CENTRAL VENOUS ACCESS DEVICE W/ SUBCUTANEOUS PORT Right    LUNG BIOPSY Right 01/27/2015   Procedure: Right Upper Lobe Bronchus BIOPSY;  Surgeon: Grace Isaac, MD;  Location: [facility];  Service: Thoracic;  Laterality: Right;   VIDEO BRONCHOSCOPY WITH ENDOBRONCHIAL ULTRASOUND N/A 01/27/2015   Procedure: VIDEO BRONCHOSCOPY WITH ENDOBRONCHIAL ULTRASOUND;  Surgeon: Grace Isaac, MD;  Location: [facility];  Service: Thoracic;  Laterality: N/A;    REVIEW OF SYSTEMS:  Constitutional: positive for fatigue Eyes: negative Ears, nose, mouth, throat, and face: negative Respiratory: negative Cardiovascular: negative Gastrointestinal: positive for diarrhea Genitourinary:negative Integument/breast: negative Hematologic/lymphatic: negative Musculoskeletal:negative Neurological: negative Behavioral/Psych: negative Endocrine:  negative Allergic/Immunologic: negative   PHYSICAL EXAMINATION: General appearance: alert, cooperative, fatigued and no distress Head: Normocephalic, without obvious abnormality, atraumatic Neck: no adenopathy, no JVD, supple, symmetrical, trachea midline and thyroid not enlarged, symmetric, no tenderness/mass/nodules Lymph nodes: Cervical, supraclavicular, and axillary nodes normal. Resp: clear to auscultation bilaterally Back: symmetric, no curvature. ROM normal. No CVA tenderness. Cardio: regular rate and rhythm, S1, S2 normal, no murmur, click, rub or gallop GI: soft, non-tender; bowel sounds normal; no masses,  no organomegaly Extremities: extremities normal, atraumatic, no cyanosis or edema Neurologic: Alert and oriented X 3, normal strength and tone. Normal symmetric reflexes. Normal coordination and gait  ECOG PERFORMANCE STATUS: 1 - Symptomatic but completely ambulatory  Blood pressure 117/72, pulse 99, temperature 97.6 F (36.4 C), temperature source Tympanic, resp. rate 18, height 5\' 11"  (1.803 m), weight 152 lb 8 oz (69.2 kg), SpO2 100 %.  LABORATORY DATA: Lab Results  Component Value Date   WBC  4.9 12/11/2019   HGB 12.7 (L) 12/11/2019   HCT 39.0 12/11/2019   MCV 87.6 12/11/2019   PLT 212 12/11/2019      Chemistry      Component Value Date/Time   NA 140 12/11/2019 1158   NA 140 02/08/2017 0822   K 3.7 12/11/2019 1158   K 4.5 02/08/2017 0822   CL 104 12/11/2019 1158   CO2 27 12/11/2019 1158   CO2 25 02/08/2017 0822   BUN 10 12/11/2019 1158   BUN 8.8 02/08/2017 0822   CREATININE 1.18 12/11/2019 1158   CREATININE 1.1 02/08/2017 0822      Component Value Date/Time   CALCIUM 9.9 12/11/2019 1158   CALCIUM 9.6 02/08/2017 0822   ALKPHOS 54 12/11/2019 1158   ALKPHOS 53 02/08/2017 0822   AST 19 12/11/2019 1158   AST 19 02/08/2017 0822   ALT 9 12/11/2019 1158   ALT 9 02/08/2017 0822   BILITOT 0.3 12/11/2019 1158   BILITOT <0.22 02/08/2017 4696        RADIOGRAPHIC STUDIES: DG Chest 2 View  Result Date: 12/09/2019 CLINICAL DATA:  Shortness of breath EXAM: CHEST - 2 VIEW COMPARISON:  10/03/2019 PET-CT FINDINGS: Cardiac shadows within normal limits. The left lung is clear. Right lung demonstrates suprahilar scarring and retraction consistent with the given clinical history of lung cancer and post radiation scarring. Recent PET-CT shows hypermetabolic uptake in this region. No acute infiltrate is seen. No sizable effusion is noted. No bony abnormality is seen. Nipple shadows are noted bilaterally. IMPRESSION: Changes consistent with the known history of right lung carcinoma with some posterior radiation scarring. Electronically Signed   By: Inez Catalina M.D.   On: 12/09/2019 15:37   CT Chest W Contrast  Result Date: 12/30/2019 CLINICAL DATA:  Primary Cancer Type: Lung Imaging Indication: Routine surveillance Interval therapy since last imaging? Yes Initial Cancer Diagnosis Date: 01/27/2015; Established by: Biopsy-proven Detailed Pathology: Recurrent non-small cell lung cancer initially diagnosed as stage IIIA non-small cell lung cancer consistent with invasive squamous cell carcinoma. Primary Tumor location: Right upper lobe. Recurrence? Yes; Date(s) of recurrence: 12/25/2018; Established by: Imaging only Surgeries: No thoracic.  Appendectomy. Chemotherapy: Yes; Ongoing?  No; Most recent administration: 03/2019 Immunotherapy?  Yes; Type: Nivolumab, ipilimumab; Ongoing? Yes Radiation therapy? Yes; Date Range: 03/01/2015 - 04/15/2015; Target: Right lung EXAM: CT CHEST, ABDOMEN, AND PELVIS WITH CONTRAST TECHNIQUE: Multidetector CT imaging of the chest, abdomen and pelvis was performed following the standard protocol during bolus administration of intravenous contrast. CONTRAST:  125mL OMNIPAQUE IOHEXOL 300 MG/ML  SOLN COMPARISON:  10/03/2019 PET-CT.  Most recent CT chest 09/15/2019. FINDINGS: CT CHEST FINDINGS Cardiovascular: Right chest port catheter.  Normal heart size. Three-vessel coronary artery calcifications. No pericardial effusion. Mediastinum/Nodes: No enlarged mediastinal, hilar, or axillary lymph nodes. Thyroid gland, trachea, and esophagus demonstrate no significant findings. Lungs/Pleura: No significant interval change in post treatment appearance of a mass of the anterior right upper lobe, measuring approximately 4.7 by 2.8 cm, previously 4.7 x 3.0 cm when measured similarly and in correspondence to PET avid area seen on prior PET-CT (series 2, image 19). Redemonstrated small volume of loculated pleural fluid posteriorly and extensive, dense post treatment consolidation and volume loss of the right upper lobe. Paraseptal emphysema. Musculoskeletal: No chest wall mass or suspicious bone lesions identified. CT ABDOMEN PELVIS FINDINGS Hepatobiliary: Multiple unchanged low-attenuation cysts or hemangiomata of the liver. No gallstones, gallbladder wall thickening, or biliary dilatation. Pancreas: Unremarkable. No pancreatic ductal dilatation or surrounding inflammatory changes. Spleen: Normal  in size without significant abnormality. Adrenals/Urinary Tract: Adrenal glands are unremarkable. Kidneys are normal, without renal calculi, solid lesion, or hydronephrosis. Bladder is unremarkable. Stomach/Bowel: Stomach is within normal limits. Appendix is surgically absent. No evidence of bowel wall thickening, distention, or inflammatory changes. Vascular/Lymphatic: Aortic atherosclerosis. No enlarged abdominal or pelvic lymph nodes. Reproductive: No mass or other abnormality. Other: No abdominal wall hernia or abnormality. No abdominopelvic ascites. Musculoskeletal: No acute or significant osseous findings. IMPRESSION: 1. No significant interval change in post treatment appearance of a mass of the anterior right upper lobe. 2. Redemonstrated small volume of loculated pleural fluid posteriorly and extensive, dense post treatment consolidation and volume loss of  the right upper lobe. 3. No evidence of nodal metastatic disease or distant metastatic disease in the abdomen or pelvis. 4. Emphysema (ICD10-J43.9). 5. Coronary artery disease. Aortic Atherosclerosis (ICD10-I70.0). Electronically Signed   By: Eddie Candle M.D.   On: 12/30/2019 10:02   CT Abdomen Pelvis W Contrast  Result Date: 12/30/2019 CLINICAL DATA:  Primary Cancer Type: Lung Imaging Indication: Routine surveillance Interval therapy since last imaging? Yes Initial Cancer Diagnosis Date: 01/27/2015; Established by: Biopsy-proven Detailed Pathology: Recurrent non-small cell lung cancer initially diagnosed as stage IIIA non-small cell lung cancer consistent with invasive squamous cell carcinoma. Primary Tumor location: Right upper lobe. Recurrence? Yes; Date(s) of recurrence: 12/25/2018; Established by: Imaging only Surgeries: No thoracic.  Appendectomy. Chemotherapy: Yes; Ongoing?  No; Most recent administration: 03/2019 Immunotherapy?  Yes; Type: Nivolumab, ipilimumab; Ongoing? Yes Radiation therapy? Yes; Date Range: 03/01/2015 - 04/15/2015; Target: Right lung EXAM: CT CHEST, ABDOMEN, AND PELVIS WITH CONTRAST TECHNIQUE: Multidetector CT imaging of the chest, abdomen and pelvis was performed following the standard protocol during bolus administration of intravenous contrast. CONTRAST:  1101mL OMNIPAQUE IOHEXOL 300 MG/ML  SOLN COMPARISON:  10/03/2019 PET-CT.  Most recent CT chest 09/15/2019. FINDINGS: CT CHEST FINDINGS Cardiovascular: Right chest port catheter. Normal heart size. Three-vessel coronary artery calcifications. No pericardial effusion. Mediastinum/Nodes: No enlarged mediastinal, hilar, or axillary lymph nodes. Thyroid gland, trachea, and esophagus demonstrate no significant findings. Lungs/Pleura: No significant interval change in post treatment appearance of a mass of the anterior right upper lobe, measuring approximately 4.7 by 2.8 cm, previously 4.7 x 3.0 cm when measured similarly and in  correspondence to PET avid area seen on prior PET-CT (series 2, image 19). Redemonstrated small volume of loculated pleural fluid posteriorly and extensive, dense post treatment consolidation and volume loss of the right upper lobe. Paraseptal emphysema. Musculoskeletal: No chest wall mass or suspicious bone lesions identified. CT ABDOMEN PELVIS FINDINGS Hepatobiliary: Multiple unchanged low-attenuation cysts or hemangiomata of the liver. No gallstones, gallbladder wall thickening, or biliary dilatation. Pancreas: Unremarkable. No pancreatic ductal dilatation or surrounding inflammatory changes. Spleen: Normal in size without significant abnormality. Adrenals/Urinary Tract: Adrenal glands are unremarkable. Kidneys are normal, without renal calculi, solid lesion, or hydronephrosis. Bladder is unremarkable. Stomach/Bowel: Stomach is within normal limits. Appendix is surgically absent. No evidence of bowel wall thickening, distention, or inflammatory changes. Vascular/Lymphatic: Aortic atherosclerosis. No enlarged abdominal or pelvic lymph nodes. Reproductive: No mass or other abnormality. Other: No abdominal wall hernia or abnormality. No abdominopelvic ascites. Musculoskeletal: No acute or significant osseous findings. IMPRESSION: 1. No significant interval change in post treatment appearance of a mass of the anterior right upper lobe. 2. Redemonstrated small volume of loculated pleural fluid posteriorly and extensive, dense post treatment consolidation and volume loss of the right upper lobe. 3. No evidence of nodal metastatic disease or distant metastatic disease in  the abdomen or pelvis. 4. Emphysema (ICD10-J43.9). 5. Coronary artery disease. Aortic Atherosclerosis (ICD10-I70.0). Electronically Signed   By: Eddie Candle M.D.   On: 12/30/2019 10:02    ASSESSMENT AND PLAN:  This is a very pleasant 74 years old African-American male with recurrent non-small cell lung cancer initially diagnosed as stage IIIA  non-small cell lung cancer, squamous cell carcinoma status post concurrent chemoradiation followed by consolidation chemotherapy. The patient has been in observation for close to 3 years. He had evidence for disease recurrence on the recent imaging studies including CT scan of the chest as well as PET scan. The patient is started  treatment with systemic chemotherapy with carboplatin for AUC of 5, paclitaxel 175 mg/M2 as well as combination of immunotherapy with nivolumab 360 mg IV every 3 weeks and ipilimumab 1 mg/KG every 6 weeks.  The chemotherapy will be given only for 2 cycles and then the patient will be maintained on immunotherapy with nivolumab and ipilimumab for a total of 2 years if he has no evidence for disease progression or unacceptable toxicity.  He is status post 9 cycles of treatment with systemic chemotherapy as well as immunotherapy with ipilimumab and nivolumab.   The patient continues to tolerate his treatment fairly well with no concerning adverse effects. He had repeat CT scan of the chest, abdomen pelvis performed recently.  I personally and independently reviewed the scans and discussed the results with the patient and his sister today. Has a scan showed no concerning findings for disease progression. I recommended for him to continue his current treatment with ipilimumab and nivolumab and he will proceed with day #22 of cycle #9 today. The patient will come back for follow-up visit in 3 weeks for evaluation before the next cycle of his treatment. He was advised to call immediately if he has any concerning symptoms in the interval. The patient was advised to reach out to his psychiatrist for evaluation of the new tremor after switching his treatment to [place].  Disclaimer: This note was dictated with voice recognition software. Similar sounding words can inadvertently be transcribed and may not be corrected upon review.

## 2019-12-31 NOTE — Patient Instructions (Signed)
Nivolumab injection What is this medicine? NIVOLUMAB (nye VOL ue mab) is a monoclonal antibody. It is used to treat colon cancer, esophageal cancer, head and neck cancer, Hodgkin lymphoma, kidney cancer, liver cancer, lung cancer, mesothelioma, melanoma, and urothelial cancer. This medicine may be used for other purposes; ask your health care provider or pharmacist if you have questions. COMMON BRAND NAME(S): Opdivo What should I tell my health care provider before I take this medicine? They need to know if you have any of these conditions:  diabetes  immune system problems  kidney disease  liver disease  lung disease  organ transplant  stomach or intestine problems  thyroid disease  an unusual or allergic reaction to nivolumab, other medicines, foods, dyes, or preservatives  pregnant or trying to get pregnant  breast-feeding How should I use this medicine? This medicine is for infusion into a vein. It is given by a health care professional in a hospital or clinic setting. A special MedGuide will be given to you before each treatment. Be sure to read this information carefully each time. Talk to your pediatrician regarding the use of this medicine in children. While this drug may be prescribed for children as young as 12 years for selected conditions, precautions do apply. Overdosage: If you think you have taken too much of this medicine contact a poison control center or emergency room at once. NOTE: This medicine is only for you. Do not share this medicine with others. What if I miss a dose? It is important not to miss your dose. Call your doctor or health care professional if you are unable to keep an appointment. What may interact with this medicine? Interactions have not been studied. Give your health care provider a list of all the medicines, herbs, non-prescription drugs, or dietary supplements you use. Also tell them if you smoke, drink alcohol, or use illegal drugs.  Some items may interact with your medicine. This list may not describe all possible interactions. Give your health care provider a list of all the medicines, herbs, non-prescription drugs, or dietary supplements you use. Also tell them if you smoke, drink alcohol, or use illegal drugs. Some items may interact with your medicine. What should I watch for while using this medicine? This drug may make you feel generally unwell. Continue your course of treatment even though you feel ill unless your doctor tells you to stop. You may need blood work done while you are taking this medicine. Do not become pregnant while taking this medicine or for 5 months after stopping it. Women should inform their doctor if they wish to become pregnant or think they might be pregnant. There is a potential for serious side effects to an unborn child. Talk to your health care professional or pharmacist for more information. Do not breast-feed an infant while taking this medicine or for 5 months after stopping it. What side effects may I notice from receiving this medicine? Side effects that you should report to your doctor or health care professional as soon as possible:  allergic reactions like skin rash, itching or hives, swelling of the face, lips, or tongue  breathing problems  blood in the urine  bloody or watery diarrhea or black, tarry stools  changes in emotions or moods  changes in vision  chest pain  cough  dizziness  feeling faint or lightheaded, falls  fever, chills  headache with fever, neck stiffness, confusion, loss of memory, sensitivity to light, hallucination, loss of contact with reality, or  seizures  joint pain  mouth sores  redness, blistering, peeling or loosening of the skin, including inside the mouth  severe muscle pain or weakness  signs and symptoms of high blood sugar such as dizziness; dry mouth; dry skin; fruity breath; nausea; stomach pain; increased hunger or thirst;  increased urination  signs and symptoms of kidney injury like trouble passing urine or change in the amount of urine  signs and symptoms of liver injury like dark yellow or brown urine; general ill feeling or flu-like symptoms; light-colored stools; loss of appetite; nausea; right upper belly pain; unusually weak or tired; yellowing of the eyes or skin  swelling of the ankles, feet, hands  trouble passing urine or change in the amount of urine  unusually weak or tired  weight gain or loss Side effects that usually do not require medical attention (report to your doctor or health care professional if they continue or are bothersome):  bone pain  constipation  decreased appetite  diarrhea  muscle pain  nausea, vomiting  tiredness This list may not describe all possible side effects. Call your doctor for medical advice about side effects. You may report side effects to FDA at 1-800-FDA-1088. Where should I keep my medicine? This drug is given in a hospital or clinic and will not be stored at home. NOTE: This sheet is a summary. It may not cover all possible information. If you have questions about this medicine, talk to your doctor, pharmacist, or health care provider.  2020 Elsevier/Gold Standard (2019-02-11 10:04:50)

## 2020-01-02 ENCOUNTER — Telehealth: Payer: Self-pay | Admitting: Internal Medicine

## 2020-01-02 NOTE — Telephone Encounter (Signed)
Scheduled per los. Called and left msg. Mailed printout  °

## 2020-01-15 DIAGNOSIS — F209 Schizophrenia, unspecified: Secondary | ICD-10-CM | POA: Diagnosis not present

## 2020-01-22 ENCOUNTER — Inpatient Hospital Stay: Payer: Medicare Other | Attending: Internal Medicine | Admitting: Internal Medicine

## 2020-01-22 ENCOUNTER — Inpatient Hospital Stay: Payer: Medicare Other

## 2020-01-22 ENCOUNTER — Encounter: Payer: Self-pay | Admitting: Internal Medicine

## 2020-01-22 ENCOUNTER — Other Ambulatory Visit: Payer: Self-pay

## 2020-01-22 VITALS — BP 122/68 | HR 106 | Temp 97.5°F | Resp 18 | Ht 71.0 in | Wt 151.5 lb

## 2020-01-22 VITALS — HR 99

## 2020-01-22 DIAGNOSIS — R5383 Other fatigue: Secondary | ICD-10-CM

## 2020-01-22 DIAGNOSIS — Z5112 Encounter for antineoplastic immunotherapy: Secondary | ICD-10-CM

## 2020-01-22 DIAGNOSIS — C3491 Malignant neoplasm of unspecified part of right bronchus or lung: Secondary | ICD-10-CM

## 2020-01-22 DIAGNOSIS — C3411 Malignant neoplasm of upper lobe, right bronchus or lung: Secondary | ICD-10-CM

## 2020-01-22 DIAGNOSIS — Z79899 Other long term (current) drug therapy: Secondary | ICD-10-CM | POA: Diagnosis not present

## 2020-01-22 DIAGNOSIS — J449 Chronic obstructive pulmonary disease, unspecified: Secondary | ICD-10-CM

## 2020-01-22 LAB — CMP (CANCER CENTER ONLY)
ALT: 11 U/L (ref 0–44)
AST: 24 U/L (ref 15–41)
Albumin: 3.3 g/dL — ABNORMAL LOW (ref 3.5–5.0)
Alkaline Phosphatase: 49 U/L (ref 38–126)
Anion gap: 7 (ref 5–15)
BUN: 8 mg/dL (ref 8–23)
CO2: 28 mmol/L (ref 22–32)
Calcium: 9.4 mg/dL (ref 8.9–10.3)
Chloride: 99 mmol/L (ref 98–111)
Creatinine: 1.09 mg/dL (ref 0.61–1.24)
GFR, Est AFR Am: >60 mL/min (ref >60)
GFR, Estimated: >60 mL/min (ref >60)
Glucose, Bld: 109 mg/dL — ABNORMAL HIGH (ref 70–99)
Potassium: 3.4 mmol/L — ABNORMAL LOW (ref 3.5–5.1)
Sodium: 134 mmol/L — ABNORMAL LOW (ref 135–145)
Total Bilirubin: 0.4 mg/dL (ref 0.3–1.2)
Total Protein: 7.6 g/dL (ref 6.5–8.1)

## 2020-01-22 LAB — CBC WITH DIFFERENTIAL (CANCER CENTER ONLY)
Abs Immature Granulocytes: 0.01 10*3/uL (ref 0.00–0.07)
Basophils Absolute: 0 10*3/uL (ref 0.0–0.1)
Basophils Relative: 0 %
Eosinophils Absolute: 0.1 10*3/uL (ref 0.0–0.5)
Eosinophils Relative: 2 %
HCT: 34.3 % — ABNORMAL LOW (ref 39.0–52.0)
Hemoglobin: 11.4 g/dL — ABNORMAL LOW (ref 13.0–17.0)
Immature Granulocytes: 0 %
Lymphocytes Relative: 30 %
Lymphs Abs: 1.4 10*3/uL (ref 0.7–4.0)
MCH: 28.4 pg (ref 26.0–34.0)
MCHC: 33.2 g/dL (ref 30.0–36.0)
MCV: 85.3 fL (ref 80.0–100.0)
Monocytes Absolute: 0.6 10*3/uL (ref 0.1–1.0)
Monocytes Relative: 12 %
Neutro Abs: 2.6 10*3/uL (ref 1.7–7.7)
Neutrophils Relative %: 56 %
Platelet Count: 212 10*3/uL (ref 150–400)
RBC: 4.02 MIL/uL — ABNORMAL LOW (ref 4.22–5.81)
RDW: 14.6 % (ref 11.5–15.5)
WBC Count: 4.6 10*3/uL (ref 4.0–10.5)
nRBC: 0 % (ref 0.0–0.2)

## 2020-01-22 LAB — TSH: TSH: 1.181 u[IU]/mL (ref 0.320–4.118)

## 2020-01-22 MED ORDER — SODIUM CHLORIDE 0.9 % IV SOLN
Freq: Once | INTRAVENOUS | Status: AC
  Filled 2020-01-22: qty 250

## 2020-01-22 MED ORDER — DIPHENHYDRAMINE HCL 50 MG/ML IJ SOLN
25.0000 mg | Freq: Once | INTRAMUSCULAR | Status: AC
  Administered 2020-01-22: 25 mg via INTRAVENOUS

## 2020-01-22 MED ORDER — SODIUM CHLORIDE 0.9% FLUSH
10.0000 mL | INTRAVENOUS | Status: DC | PRN
  Administered 2020-01-22: 10 mL
  Filled 2020-01-22: qty 10

## 2020-01-22 MED ORDER — DIPHENHYDRAMINE HCL 50 MG/ML IJ SOLN
INTRAMUSCULAR | Status: AC
  Filled 2020-01-22: qty 1

## 2020-01-22 MED ORDER — HEPARIN SOD (PORK) LOCK FLUSH 100 UNIT/ML IV SOLN
500.0000 [IU] | Freq: Once | INTRAVENOUS | Status: AC | PRN
  Administered 2020-01-22: 500 [IU]
  Filled 2020-01-22: qty 5

## 2020-01-22 MED ORDER — FAMOTIDINE IN NACL 20-0.9 MG/50ML-% IV SOLN
20.0000 mg | Freq: Once | INTRAVENOUS | Status: AC
  Administered 2020-01-22: 20 mg via INTRAVENOUS

## 2020-01-22 MED ORDER — FAMOTIDINE IN NACL 20-0.9 MG/50ML-% IV SOLN
INTRAVENOUS | Status: AC
  Filled 2020-01-22: qty 50

## 2020-01-22 MED ORDER — SODIUM CHLORIDE 0.9 % IV SOLN
1.0000 mg/kg | Freq: Once | INTRAVENOUS | Status: AC
  Administered 2020-01-22: 70 mg via INTRAVENOUS
  Filled 2020-01-22: qty 14

## 2020-01-22 MED ORDER — SODIUM CHLORIDE 0.9 % IV SOLN
360.0000 mg | Freq: Once | INTRAVENOUS | Status: AC
  Administered 2020-01-22: 360 mg via INTRAVENOUS
  Filled 2020-01-22: qty 12

## 2020-01-22 NOTE — Progress Notes (Addendum)
Per Dr Julien Nordmann , it is ok to treat pt today with Nivolumab and Ipilimumab and heart rate of 106.

## 2020-01-22 NOTE — Patient Instructions (Signed)
[facility] Discharge Instructions for Patients Receiving Chemotherapy  Today you received the following chemotherapy agents Opdivo and [place]  To help prevent nausea and vomiting after your treatment, we encourage you to take your nausea medication as directed.   If you develop nausea and vomiting that is not controlled by your nausea medication, call the clinic.   BELOW ARE SYMPTOMS THAT SHOULD BE REPORTED IMMEDIATELY:  *FEVER GREATER THAN 100.5 F  *CHILLS WITH OR WITHOUT FEVER  NAUSEA AND VOMITING THAT IS NOT CONTROLLED WITH YOUR NAUSEA MEDICATION  *UNUSUAL SHORTNESS OF BREATH  *UNUSUAL BRUISING OR BLEEDING  TENDERNESS IN MOUTH AND THROAT WITH OR WITHOUT PRESENCE OF ULCERS  *URINARY PROBLEMS  *BOWEL PROBLEMS  UNUSUAL RASH Items with * indicate a potential emergency and should be followed up as soon as possible.  Feel free to call the clinic should you have any questions or concerns. The clinic phone number is [phone].  Please show the [facility] at check-in to the Emergency Department and triage nurse.

## 2020-01-22 NOTE — Progress Notes (Signed)
[place] [facility] Telephone:([phone]) [phone]   Fax:([phone]) [phone]  OFFICE PROGRESS NOTE  [name], FNP [address] 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  He has disease recurrence in August 2020.  PRIOR THERAPY:  1) Course of concurrent chemoradiation with weekly carboplatin for AUC of 2 and paclitaxel 45 MG/M2. First dose 03/01/2015. He is status post 6 cycles. Last cycle was given 04/12/2015 with partial response. 2) Consolidation chemotherapy with reduced dose carboplatin for AUC of 5 and paclitaxel 175 MG/M2 every 3 weeks, status post 3 cycles.  CURRENT THERAPY: Systemic chemotherapy with carboplatin for AUC of 5, paclitaxel 175 mg/M2 with nivolumab 360 mg IV every 3 weeks in addition to ipilimumab 1 mg/KG every 6 weeks.  First dose 01/08/2019.  Status post 9 cycles.  Starting from cycle #3 the patient will be on maintenance treatment with ipilimumab every 6 weeks and nivolumab every 3 weeks.  INTERVAL HISTORY: Ivan Daniels 74 y.o. male returns to the clinic today for follow-up visit accompanied by his sister.  The patient is feeling fine today with no concerning complaints except for occasional dizzy spells and tremor in the left hand.  He also has dry mouth.  He was seen by his psychiatrist and changed his treatment from Haldol to Cogentin.  The patient denied having any current chest pain, shortness of breath, cough or hemoptysis.  He denied having any fever or chills.  He has no nausea, vomiting, diarrhea or constipation.  He has no headache or visual changes.  He continues to tolerate his treatment with immunotherapy fairly well.  He is here today for evaluation before  starting cycle #10. Marland Kitchen  MEDICAL HISTORY: Past Medical History:  Diagnosis Date  . ALCOHOL ABUSE 11/12/2009   Qualifier: Diagnosis of  By: Hassell Done FNP, Tori Milks    . Allergy   . Anemia   . Anxiety   . BENIGN PROSTATIC HYPERTROPHY, HX OF 12/19/2006   Qualifier: Diagnosis of  By: Radene Ou MD, [place]    . Blood transfusion without reported diagnosis 2017  . Clotting disorder ([facility])    takes PLAVIX  . Depression   . Emphysema of lung ([facility])   . Full code status 02/10/2015  . GERD (gastroesophageal reflux disease)   . Hyperlipidemia   . Lung mass 01/25/2015   3.7 x 3.6 cm RUL spiculated lung mass with 2.0 cm right paratracheal lymph node suspicious for bronchogenic CA  . Non-small cell lung cancer ([facility]) 01/27/15   non small-cell,squamous cell ca RUL  . Paranoid schizophrenia ([facility]) 10/31/2009   Qualifier: Diagnosis of  By: Jorene Minors, [place]    . Primary spontaneous pneumothorax, left 01/25/2015  . TOBACCO ABUSE 05/24/2009   Qualifier: Diagnosis of  By: Hassell Done FNP, [place]      ALLERGIES:  is allergic to benztropine mesylate and chlorpromazine hcl.  MEDICATIONS:  Current Outpatient Medications  Medication Sig Dispense Refill  . ALPHAGAN P 0.1 % SOLN Place 1 drop into both eyes 3 (three) times daily.  6  . aspirin EC 81 MG tablet Take 81 mg by mouth 2 (two) times daily.    . chlorhexidine (PERIDEX) 0.12 % solution 1 mL by Mouth Rinse route as needed.   1  . clopidogrel (  PLAVIX) 75 MG tablet Take 75 mg by mouth daily.     . ferrous fumarate (HEMOCYTE - 106 MG FE) 325 (106 FE) MG TABS tablet Take 1 tablet by mouth daily.     . haloperidol (HALDOL) 5 MG tablet Take 5 mg by mouth at bedtime.     . Multiple Vitamins-Minerals (MULTIVITAMIN WITH MINERALS) tablet Take 1 tablet by mouth daily.    . NONFORMULARY OR COMPOUNDED [place]  Combination Pain Cream -  Baclofen 2%, Doxepin 5%, Gabapentin 6%, Topiramate 2%, Pentoxifylline 3% Apply 1-2 grams to affected area 3-4 times  daily Qty. 120 gm 3 refills 1 each 3  . ondansetron (ZOFRAN) 8 MG tablet Take 1 tablet (8 mg total) by mouth every 8 (eight) hours as needed for nausea or vomiting. 20 tablet 0  . simvastatin (ZOCOR) 20 MG tablet Take 20 mg by mouth at bedtime.     Marland Kitchen umeclidinium-vilanterol (ANORO ELLIPTA) 62.5-25 MCG/INH AEPB Inhale 1 puff into the lungs daily. 1 each 11  . VYZULTA 0.024 % SOLN      Current Facility-Administered Medications  Medication Dose Route Frequency Provider Last Rate Last Admin  . 0.9 %  sodium chloride infusion  500 mL Intravenous Continuous Irene Shipper, MD      . 0.9 %  sodium chloride infusion  500 mL Intravenous Continuous Irene Shipper, MD      . 0.9 %  sodium chloride infusion  500 mL Intravenous Continuous Irene Shipper, MD        SURGICAL HISTORY:  Past Surgical History:  Procedure Laterality Date  . APPENDECTOMY    . CHEST TUBE INSERTION Left   . COLONOSCOPY    . INSERTION CENTRAL VENOUS ACCESS DEVICE W/ SUBCUTANEOUS PORT Right   . LUNG BIOPSY Right 01/27/2015   Procedure: Right Upper Lobe Bronchus BIOPSY;  Surgeon: Grace Isaac, MD;  Location: [facility];  Service: Thoracic;  Laterality: Right;  Marland Kitchen VIDEO BRONCHOSCOPY WITH ENDOBRONCHIAL ULTRASOUND N/A 01/27/2015   Procedure: VIDEO BRONCHOSCOPY WITH ENDOBRONCHIAL ULTRASOUND;  Surgeon: Grace Isaac, MD;  Location: [facility];  Service: Thoracic;  Laterality: N/A;    REVIEW OF SYSTEMS:  A comprehensive review of systems was negative except for: Ears, nose, mouth, throat, and face: positive for Dry mouth Neurological: positive for dizziness and tremors   PHYSICAL EXAMINATION: General appearance: alert, cooperative, fatigued and no distress Head: Normocephalic, without obvious abnormality, atraumatic Neck: no adenopathy, no JVD, supple, symmetrical, trachea midline and thyroid not enlarged, symmetric, no tenderness/mass/nodules Lymph nodes: Cervical, supraclavicular, and axillary nodes normal. Resp: clear to  auscultation bilaterally Back: symmetric, no curvature. ROM normal. No CVA tenderness. Cardio: regular rate and rhythm, S1, S2 normal, no murmur, click, rub or gallop GI: soft, non-tender; bowel sounds normal; no masses,  no organomegaly Extremities: extremities normal, atraumatic, no cyanosis or edema  ECOG PERFORMANCE STATUS: 1 - Symptomatic but completely ambulatory  Blood pressure 122/68, pulse (!) 106, temperature (!) 97.5 F (36.4 C), temperature source Tympanic, resp. rate 18, height 5\' 11"  (1.803 m), weight 151 lb 8 oz (68.7 kg), SpO2 99 %.  LABORATORY DATA: Lab Results  Component Value Date   WBC 4.6 01/22/2020   HGB 11.4 (L) 01/22/2020   HCT 34.3 (L) 01/22/2020   MCV 85.3 01/22/2020   PLT 212 01/22/2020      Chemistry      Component Value Date/Time   NA 139 12/31/2019 1055   NA 140 02/08/2017 0822   K 3.8 12/31/2019  1055   K 4.5 02/08/2017 0822   CL 104 12/31/2019 1055   CO2 29 12/31/2019 1055   CO2 25 02/08/2017 0822   BUN 10 12/31/2019 1055   BUN 8.8 02/08/2017 0822   CREATININE 1.14 12/31/2019 1055   CREATININE 1.1 02/08/2017 0822      Component Value Date/Time   CALCIUM 9.9 12/31/2019 1055   CALCIUM 9.6 02/08/2017 0822   ALKPHOS 54 12/31/2019 1055   ALKPHOS 53 02/08/2017 0822   AST 16 12/31/2019 1055   AST 19 02/08/2017 0822   ALT 7 12/31/2019 1055   ALT 9 02/08/2017 0822   BILITOT 0.2 (L) 12/31/2019 1055   BILITOT <0.22 02/08/2017 1308       RADIOGRAPHIC STUDIES: CT Chest W Contrast  Result Date: 12/30/2019 CLINICAL DATA:  Primary Cancer Type: Lung Imaging Indication: Routine surveillance Interval therapy since last imaging? Yes Initial Cancer Diagnosis Date: 01/27/2015; Established by: Biopsy-proven Detailed Pathology: Recurrent non-small cell lung cancer initially diagnosed as stage IIIA non-small cell lung cancer consistent with invasive squamous cell carcinoma. Primary Tumor location: Right upper lobe. Recurrence? Yes; Date(s) of recurrence:  12/25/2018; Established by: Imaging only Surgeries: No thoracic.  Appendectomy. Chemotherapy: Yes; Ongoing?  No; Most recent administration: 03/2019 Immunotherapy?  Yes; Type: Nivolumab, ipilimumab; Ongoing? Yes Radiation therapy? Yes; Date Range: 03/01/2015 - 04/15/2015; Target: Right lung EXAM: CT CHEST, ABDOMEN, AND PELVIS WITH CONTRAST TECHNIQUE: Multidetector CT imaging of the chest, abdomen and pelvis was performed following the standard protocol during bolus administration of intravenous contrast. CONTRAST:  124mL OMNIPAQUE IOHEXOL 300 MG/ML  SOLN COMPARISON:  10/03/2019 PET-CT.  Most recent CT chest 09/15/2019. FINDINGS: CT CHEST FINDINGS Cardiovascular: Right chest port catheter. Normal heart size. Three-vessel coronary artery calcifications. No pericardial effusion. Mediastinum/Nodes: No enlarged mediastinal, hilar, or axillary lymph nodes. Thyroid gland, trachea, and esophagus demonstrate no significant findings. Lungs/Pleura: No significant interval change in post treatment appearance of a mass of the anterior right upper lobe, measuring approximately 4.7 by 2.8 cm, previously 4.7 x 3.0 cm when measured similarly and in correspondence to PET avid area seen on prior PET-CT (series 2, image 19). Redemonstrated small volume of loculated pleural fluid posteriorly and extensive, dense post treatment consolidation and volume loss of the right upper lobe. Paraseptal emphysema. Musculoskeletal: No chest wall mass or suspicious bone lesions identified. CT ABDOMEN PELVIS FINDINGS Hepatobiliary: Multiple unchanged low-attenuation cysts or hemangiomata of the liver. No gallstones, gallbladder wall thickening, or biliary dilatation. Pancreas: Unremarkable. No pancreatic ductal dilatation or surrounding inflammatory changes. Spleen: Normal in size without significant abnormality. Adrenals/Urinary Tract: Adrenal glands are unremarkable. Kidneys are normal, without renal calculi, solid lesion, or hydronephrosis. Bladder  is unremarkable. Stomach/Bowel: Stomach is within normal limits. Appendix is surgically absent. No evidence of bowel wall thickening, distention, or inflammatory changes. Vascular/Lymphatic: Aortic atherosclerosis. No enlarged abdominal or pelvic lymph nodes. Reproductive: No mass or other abnormality. Other: No abdominal wall hernia or abnormality. No abdominopelvic ascites. Musculoskeletal: No acute or significant osseous findings. IMPRESSION: 1. No significant interval change in post treatment appearance of a mass of the anterior right upper lobe. 2. Redemonstrated small volume of loculated pleural fluid posteriorly and extensive, dense post treatment consolidation and volume loss of the right upper lobe. 3. No evidence of nodal metastatic disease or distant metastatic disease in the abdomen or pelvis. 4. Emphysema (ICD10-J43.9). 5. Coronary artery disease. Aortic Atherosclerosis (ICD10-I70.0). Electronically Signed   By: Eddie Candle M.D.   On: 12/30/2019 10:02   CT Abdomen Pelvis W Contrast  Result Date: 12/30/2019 CLINICAL DATA:  Primary Cancer Type: Lung Imaging Indication: Routine surveillance Interval therapy since last imaging? Yes Initial Cancer Diagnosis Date: 01/27/2015; Established by: Biopsy-proven Detailed Pathology: Recurrent non-small cell lung cancer initially diagnosed as stage IIIA non-small cell lung cancer consistent with invasive squamous cell carcinoma. Primary Tumor location: Right upper lobe. Recurrence? Yes; Date(s) of recurrence: 12/25/2018; Established by: Imaging only Surgeries: No thoracic.  Appendectomy. Chemotherapy: Yes; Ongoing?  No; Most recent administration: 03/2019 Immunotherapy?  Yes; Type: Nivolumab, ipilimumab; Ongoing? Yes Radiation therapy? Yes; Date Range: 03/01/2015 - 04/15/2015; Target: Right lung EXAM: CT CHEST, ABDOMEN, AND PELVIS WITH CONTRAST TECHNIQUE: Multidetector CT imaging of the chest, abdomen and pelvis was performed following the standard protocol  during bolus administration of intravenous contrast. CONTRAST:  15mL OMNIPAQUE IOHEXOL 300 MG/ML  SOLN COMPARISON:  10/03/2019 PET-CT.  Most recent CT chest 09/15/2019. FINDINGS: CT CHEST FINDINGS Cardiovascular: Right chest port catheter. Normal heart size. Three-vessel coronary artery calcifications. No pericardial effusion. Mediastinum/Nodes: No enlarged mediastinal, hilar, or axillary lymph nodes. Thyroid gland, trachea, and esophagus demonstrate no significant findings. Lungs/Pleura: No significant interval change in post treatment appearance of a mass of the anterior right upper lobe, measuring approximately 4.7 by 2.8 cm, previously 4.7 x 3.0 cm when measured similarly and in correspondence to PET avid area seen on prior PET-CT (series 2, image 19). Redemonstrated small volume of loculated pleural fluid posteriorly and extensive, dense post treatment consolidation and volume loss of the right upper lobe. Paraseptal emphysema. Musculoskeletal: No chest wall mass or suspicious bone lesions identified. CT ABDOMEN PELVIS FINDINGS Hepatobiliary: Multiple unchanged low-attenuation cysts or hemangiomata of the liver. No gallstones, gallbladder wall thickening, or biliary dilatation. Pancreas: Unremarkable. No pancreatic ductal dilatation or surrounding inflammatory changes. Spleen: Normal in size without significant abnormality. Adrenals/Urinary Tract: Adrenal glands are unremarkable. Kidneys are normal, without renal calculi, solid lesion, or hydronephrosis. Bladder is unremarkable. Stomach/Bowel: Stomach is within normal limits. Appendix is surgically absent. No evidence of bowel wall thickening, distention, or inflammatory changes. Vascular/Lymphatic: Aortic atherosclerosis. No enlarged abdominal or pelvic lymph nodes. Reproductive: No mass or other abnormality. Other: No abdominal wall hernia or abnormality. No abdominopelvic ascites. Musculoskeletal: No acute or significant osseous findings. IMPRESSION: 1. No  significant interval change in post treatment appearance of a mass of the anterior right upper lobe. 2. Redemonstrated small volume of loculated pleural fluid posteriorly and extensive, dense post treatment consolidation and volume loss of the right upper lobe. 3. No evidence of nodal metastatic disease or distant metastatic disease in the abdomen or pelvis. 4. Emphysema (ICD10-J43.9). 5. Coronary artery disease. Aortic Atherosclerosis (ICD10-I70.0). Electronically Signed   By: Eddie Candle M.D.   On: 12/30/2019 10:02    ASSESSMENT AND PLAN:  This is a very pleasant 74 years old African-American male with recurrent non-small cell lung cancer initially diagnosed as stage IIIA non-small cell lung cancer, squamous cell carcinoma status post concurrent chemoradiation followed by consolidation chemotherapy. The patient has been in observation for close to 3 years. He had evidence for disease recurrence on the recent imaging studies including CT scan of the chest as well as PET scan. The patient is started  treatment with systemic chemotherapy with carboplatin for AUC of 5, paclitaxel 175 mg/M2 as well as combination of immunotherapy with nivolumab 360 mg IV every 3 weeks and ipilimumab 1 mg/KG every 6 weeks.  The chemotherapy will be given only for 2 cycles and then the patient will be maintained on immunotherapy with nivolumab and ipilimumab for  a total of 2 years if he has no evidence for disease progression or unacceptable toxicity.  He is status post 9 cycles of treatment. The patient continues to tolerate his treatment well with no concerning adverse effects. I recommended for him to proceed with day 1 of cycle #10 today as planned. The patient will come back for follow-up visit in 3 weeks for evaluation before starting day 22 of cycle #10. For the dizziness and tremor he will follow-up with his psychiatrist and if no improvement will consider the patient for repeat MRI of the brain to rule out any  metastatic disease to the brain. He was advised to call immediately if he has any concerning symptoms in the interval.  Disclaimer: This note was dictated with voice recognition software. Similar sounding words can inadvertently be transcribed and may not be corrected upon review.

## 2020-01-22 NOTE — Progress Notes (Signed)
[place] questionnaire completed. Patient answered no to all questions.

## 2020-01-26 ENCOUNTER — Emergency Department (HOSPITAL_COMMUNITY): Payer: Medicare Other

## 2020-01-26 ENCOUNTER — Other Ambulatory Visit: Payer: Self-pay

## 2020-01-26 ENCOUNTER — Encounter (HOSPITAL_COMMUNITY): Payer: Self-pay

## 2020-01-26 ENCOUNTER — Emergency Department (HOSPITAL_COMMUNITY)
Admission: EM | Admit: 2020-01-26 | Discharge: 2020-01-26 | Disposition: A | Discharge status: Home / Self Care | Payer: Medicare Other | Attending: Emergency Medicine | Admitting: Emergency Medicine

## 2020-01-26 DIAGNOSIS — R251 Tremor, unspecified: Secondary | ICD-10-CM | POA: Diagnosis not present

## 2020-01-26 DIAGNOSIS — J449 Chronic obstructive pulmonary disease, unspecified: Secondary | ICD-10-CM | POA: Insufficient documentation

## 2020-01-26 DIAGNOSIS — G319 Degenerative disease of nervous system, unspecified: Secondary | ICD-10-CM | POA: Diagnosis not present

## 2020-01-26 DIAGNOSIS — Z87891 Personal history of nicotine dependence: Secondary | ICD-10-CM | POA: Diagnosis not present

## 2020-01-26 DIAGNOSIS — C3490 Malignant neoplasm of unspecified part of unspecified bronchus or lung: Secondary | ICD-10-CM | POA: Diagnosis not present

## 2020-01-26 DIAGNOSIS — R42 Dizziness and giddiness: Secondary | ICD-10-CM | POA: Diagnosis not present

## 2020-01-26 DIAGNOSIS — R918 Other nonspecific abnormal finding of lung field: Secondary | ICD-10-CM | POA: Diagnosis not present

## 2020-01-26 DIAGNOSIS — R110 Nausea: Secondary | ICD-10-CM | POA: Diagnosis not present

## 2020-01-26 DIAGNOSIS — R000 Tachycardia, unspecified: Secondary | ICD-10-CM | POA: Diagnosis not present

## 2020-01-26 DIAGNOSIS — I6782 Cerebral ischemia: Secondary | ICD-10-CM | POA: Diagnosis not present

## 2020-01-26 DIAGNOSIS — Z7982 Long term (current) use of aspirin: Secondary | ICD-10-CM | POA: Diagnosis not present

## 2020-01-26 DIAGNOSIS — R0600 Dyspnea, unspecified: Secondary | ICD-10-CM | POA: Diagnosis not present

## 2020-01-26 LAB — CBC WITH DIFFERENTIAL/PLATELET
Abs Immature Granulocytes: 0.01 10*3/uL (ref 0.00–0.07)
Basophils Absolute: 0 10*3/uL (ref 0.0–0.1)
Basophils Relative: 0 %
Eosinophils Absolute: 0.1 10*3/uL (ref 0.0–0.5)
Eosinophils Relative: 1 %
HCT: 33.4 % — ABNORMAL LOW (ref 39.0–52.0)
Hemoglobin: 11.1 g/dL — ABNORMAL LOW (ref 13.0–17.0)
Immature Granulocytes: 0 %
Lymphocytes Relative: 32 %
Lymphs Abs: 1.6 10*3/uL (ref 0.7–4.0)
MCH: 29.1 pg (ref 26.0–34.0)
MCHC: 33.2 g/dL (ref 30.0–36.0)
MCV: 87.4 fL (ref 80.0–100.0)
Monocytes Absolute: 0.6 10*3/uL (ref 0.1–1.0)
Monocytes Relative: 12 %
Neutro Abs: 2.8 10*3/uL (ref 1.7–7.7)
Neutrophils Relative %: 55 %
Platelets: 244 10*3/uL (ref 150–400)
RBC: 3.82 MIL/uL — ABNORMAL LOW (ref 4.22–5.81)
RDW: 14.9 % (ref 11.5–15.5)
WBC: 5.2 10*3/uL (ref 4.0–10.5)
nRBC: 0 % (ref 0.0–0.2)

## 2020-01-26 LAB — URINALYSIS, ROUTINE W REFLEX MICROSCOPIC
Bilirubin Urine: NEGATIVE
Glucose, UA: NEGATIVE mg/dL
Hgb urine dipstick: NEGATIVE
Ketones, ur: NEGATIVE mg/dL
Leukocytes,Ua: NEGATIVE
Nitrite: NEGATIVE
Protein, ur: NEGATIVE mg/dL
Specific Gravity, Urine: 1.012 (ref 1.005–1.030)
pH: 5 (ref 5.0–8.0)

## 2020-01-26 LAB — COMPREHENSIVE METABOLIC PANEL
ALT: 16 U/L (ref 0–44)
AST: 26 U/L (ref 15–41)
Albumin: 3.3 g/dL — ABNORMAL LOW (ref 3.5–5.0)
Alkaline Phosphatase: 40 U/L (ref 38–126)
Anion gap: 9 (ref 5–15)
BUN: 7 mg/dL — ABNORMAL LOW (ref 8–23)
CO2: 25 mmol/L (ref 22–32)
Calcium: 8.5 mg/dL — ABNORMAL LOW (ref 8.9–10.3)
Chloride: 102 mmol/L (ref 98–111)
Creatinine, Ser: 0.99 mg/dL (ref 0.61–1.24)
GFR calc Af Amer: >60 mL/min (ref >60)
GFR calc non Af Amer: >60 mL/min (ref >60)
Glucose, Bld: 99 mg/dL (ref 70–99)
Potassium: 3.5 mmol/L (ref 3.5–5.1)
Sodium: 136 mmol/L (ref 135–145)
Total Bilirubin: 0.4 mg/dL (ref 0.3–1.2)
Total Protein: 7 g/dL (ref 6.5–8.1)

## 2020-01-26 MED ORDER — GADOBUTROL 1 MMOL/ML IV SOLN
6.0000 mL | Freq: Once | INTRAVENOUS | Status: AC | PRN
  Administered 2020-01-26: 6 mL via INTRAVENOUS

## 2020-01-26 MED ORDER — SODIUM CHLORIDE 0.9 % IV BOLUS
500.0000 mL | Freq: Once | INTRAVENOUS | Status: AC
  Administered 2020-01-26: 500 mL via INTRAVENOUS

## 2020-01-26 NOTE — Discharge Instructions (Signed)
Please return for any problem.   Follow up with your regular care providers as instructed.

## 2020-01-26 NOTE — ED Provider Notes (Signed)
[facility] DEPT Provider Note   CSN: [id number] Arrival date & time: 01/26/20  2706     History No chief complaint on file.   Ivan Daniels is a 74 y.o. male.  74 year old male with prior medical history as detailed below presents for evaluation of reported dizziness.  Patient's dizziness is an ongoing issue for the last 3 to 4 weeks.  Patient is known to oncology for treatment of lung cancer.  Patient's dizziness has been attributed to medications and current ongoing oncologic treatment.  However, per Dr. Worthy Flank last note - if dizziness fails to improve MRI brain would be warranted to rule out metastatic disease.  The patient was brought to the ED this morning by his sister.  The sister reports increased episodes of dizziness over the weekend.  Currently the patient is asymptomatic.  Patient's sister is requesting MR today for evaluation of possible metastatic disease to the brain.  The history is provided by the patient, a relative and medical records.  Illness Location:  Dizziness Severity:  Mild Onset quality:  Unable to specify Duration:  4 weeks Timing:  Intermittent Progression:  Waxing and waning Chronicity:  Recurrent Associated symptoms: nausea   Associated symptoms: no fever and no headaches        Past Medical History:  Diagnosis Date  . ALCOHOL ABUSE 11/12/2009   Qualifier: Diagnosis of  By: Hassell Done FNP, Tori Milks    . Allergy   . Anemia   . Anxiety   . BENIGN PROSTATIC HYPERTROPHY, HX OF 12/19/2006   Qualifier: Diagnosis of  By: Radene Ou MD, [place]    . Blood transfusion without reported diagnosis 2017  . Clotting disorder ([place])    takes PLAVIX  . Depression   . Emphysema of lung ([place])   . Full code status 02/10/2015  . GERD (gastroesophageal reflux disease)   . Hyperlipidemia   . Lung mass 01/25/2015   3.7 x 3.6 cm RUL spiculated lung mass with 2.0 cm right paratracheal lymph node suspicious for bronchogenic CA  .  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 Paranoid schizophrenia ([place]) 10/31/2009   Qualifier: Diagnosis of  By: Jorene Minors, [place]    . Primary spontaneous pneumothorax, left 01/25/2015  . TOBACCO ABUSE 05/24/2009   Qualifier: Diagnosis of  By: Hassell Done FNP, Tori Milks      Patient Active Problem List   Diagnosis Date Noted  . Encounter for antineoplastic immunotherapy 01/01/2019  . Goals of care, counseling/discussion 12/18/2018  . COPD GOLD 0  06/15/2016  . Long term current use of antithrombotics/antiplatelets 06/07/2016  . Abnormal esophagram 06/07/2016  . Esophageal stricture 06/07/2016  . Pulmonary emphysema ([place]) 05/12/2016  . Dysphagia 05/12/2016  . 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 Encounter for antineoplastic chemotherapy 03/22/2015  . Primary cancer of right upper lobe of lung ([place]) 02/17/2015  . Non-small cell lung cancer ([place]) 02/10/2015  . Full code status 02/10/2015  . Primary spontaneous pneumothorax, left 01/25/2015  . Lung mass 01/25/2015  . KERATOSIS 04/12/2010  . 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 Paranoid schizophrenia ([place]) 10/31/2009  . EPISTAXIS 11/19/2008  . 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 UNSPECIFIED ANEMIA 11/12/2007  . ATHEROSCLEROSIS, CEREBRAL 02/18/2007  . WEIGHT LOSS, ABNORMAL 02/18/2007  . DYSLIPIDEMIA 12/19/2006  . 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 CAROTID ARTERY STENOSIS, LEFT 12/19/2006  . BENIGN PROSTATIC HYPERTROPHY, HX OF 12/19/2006    Past Surgical History:  Procedure Laterality Date  . APPENDECTOMY    .  CHEST TUBE INSERTION Left   . COLONOSCOPY    . INSERTION CENTRAL VENOUS ACCESS DEVICE W/ SUBCUTANEOUS PORT Right   . LUNG BIOPSY Right 01/27/2015   Procedure: Right Upper Lobe Bronchus BIOPSY;  Surgeon: Grace Isaac, MD;  Location: [facility];  Service: Thoracic;  Laterality: Right;  Marland Kitchen VIDEO BRONCHOSCOPY WITH ENDOBRONCHIAL ULTRASOUND N/A 01/27/2015   Procedure:  VIDEO BRONCHOSCOPY WITH ENDOBRONCHIAL ULTRASOUND;  Surgeon: Grace Isaac, MD;  Location: [facility] OR;  Service: Thoracic;  Laterality: N/A;       Family History  Problem Relation Age of Onset  . Breast cancer Sister   . Diabetes Sister   . Hyperlipidemia Sister   . Lung disease Neg Hx   . Colon cancer Neg Hx   . Esophageal cancer Neg Hx   . Stomach cancer Neg Hx   . Pancreatic cancer Neg Hx   . Prostate cancer Neg Hx   . Rectal cancer Neg Hx     Social History   Tobacco Use  . Smoking status: Former Smoker    Packs/day: 0.25    Years: 52.00    Pack years: 13.00    Types: Cigarettes    Quit date: 01/07/2016    Years since quitting: 4.0  . Smokeless tobacco: Never Used  . Tobacco comment: Peak rate of 4-5 cigarettes per day  Vaping Use  . Vaping Use: Never used  Substance Use Topics  . Alcohol use: No    Alcohol/week: 0.0 standard drinks  . Drug use: No    Home Medications Prior to Admission medications   Medication Sig Start Date End Date Taking? Authorizing Provider  ALPHAGAN P 0.1 % SOLN Place 1 drop into both eyes 3 (three) times daily. 05/30/17   [provider]  aspirin EC 81 MG tablet Take 81 mg by mouth 2 (two) times daily.    [provider]  benztropine (COGENTIN) 0.5 MG tablet Take 0.5 mg by mouth 2 (two) times daily.    [provider]  chlorhexidine (PERIDEX) 0.12 % solution 1 mL by Mouth Rinse route as needed.  06/15/15   [provider]  clopidogrel (PLAVIX) 75 MG tablet Take 75 mg by mouth daily.  12/09/12   [provider]  ferrous fumarate (HEMOCYTE - 106 MG FE) 325 (106 FE) MG TABS tablet Take 1 tablet by mouth daily.     [provider]  haloperidol (HALDOL) 5 MG tablet Take 5 mg by mouth at bedtime.  Patient not taking: Reported on 01/22/2020 02/21/13   [provider]  Multiple Vitamins-Minerals (MULTIVITAMIN WITH MINERALS) tablet Take 1 tablet by mouth daily.    [provider]    NONFORMULARY OR COMPOUNDED [place]  Combination Pain Cream -  Baclofen 2%, Doxepin 5%, Gabapentin 6%, Topiramate 2%, Pentoxifylline 3% Apply 1-2 grams to affected area 3-4 times daily Qty. 120 gm 3 refills 07/03/17   Gardiner Barefoot, DPM  ondansetron (ZOFRAN) 8 MG tablet Take 1 tablet (8 mg total) by mouth every 8 (eight) hours as needed for nausea or vomiting. 01/08/19   Curt Bears, MD  simvastatin (ZOCOR) 20 MG tablet Take 20 mg by mouth at bedtime.  02/14/13   [provider]  umeclidinium-vilanterol (ANORO ELLIPTA) 62.5-25 MCG/INH AEPB Inhale 1 puff into the lungs daily. 04/11/19   Tanda Rockers, MD  VYZULTA 0.024 % SOLN  09/19/19   [provider]    Allergies    Benztropine mesylate and Chlorpromazine hcl  Review of  Systems   Review of Systems  Constitutional: Negative for fever.  Gastrointestinal: Positive for nausea.  Neurological: Negative for headaches.  All other systems reviewed and are negative.   Physical Exam Updated Vital Signs BP 139/80 (BP Location: Left Arm)   Pulse (!) 103   Temp 98.7 F (37.1 C) (Oral)   Resp (!) 27   Ht 5\' 11"  (1.803 m)   Wt 68 kg   SpO2 98%   BMI 20.92 kg/m   Physical Exam Vitals and nursing note reviewed.  Constitutional:      General: He is not in acute distress.    Appearance: Normal appearance. He is well-developed.  HENT:     Head: Normocephalic and atraumatic.  Eyes:     Conjunctiva/sclera: Conjunctivae normal.     Pupils: Pupils are equal, round, and reactive to light.  Cardiovascular:     Rate and Rhythm: Normal rate and regular rhythm.     Heart sounds: Normal heart sounds.  Pulmonary:     Effort: Pulmonary effort is normal. No respiratory distress.     Breath sounds: Normal breath sounds.  Abdominal:     General: There is no distension.     Palpations: Abdomen is soft.     Tenderness: There is no abdominal tenderness.  Musculoskeletal:        General: No deformity. Normal  range of motion.     Cervical back: Normal range of motion and neck supple.  Skin:    General: Skin is warm and dry.  Neurological:     General: No focal deficit present.     Mental Status: He is alert and oriented to person, place, and time. Mental status is at baseline.     ED Results / Procedures / Treatments   Labs (all labs ordered are listed, but only abnormal results are displayed) Labs Reviewed  COMPREHENSIVE METABOLIC PANEL - Abnormal; Notable for the following components:      Result Value   BUN 7 (*)    Calcium 8.5 (*)    Albumin 3.3 (*)    All other components within normal limits  CBC WITH DIFFERENTIAL/PLATELET - Abnormal; Notable for the following components:   RBC 3.82 (*)    Hemoglobin 11.1 (*)    HCT 33.4 (*)    All other components within normal limits  URINALYSIS, ROUTINE W REFLEX MICROSCOPIC    EKG EKG Interpretation  Date/Time:  Monday January 26 2020 09:21:21 EDT Ventricular Rate:  104 PR Interval:    QRS Duration: 88 QT Interval:  324 QTC Calculation: 427 R Axis:   80 Text Interpretation: Sinus tachycardia Confirmed by Dene Gentry [phone]) on 01/26/2020 9:37:54 AM   Radiology MR Brain W and Wo Contrast  Result Date: 01/26/2020 CLINICAL DATA:  Metastatic disease evaluation, dizziness and nausea. EXAM: MRI HEAD WITHOUT AND WITH CONTRAST TECHNIQUE: Multiplanar, multiecho pulse sequences of the brain and surrounding structures were obtained without and with intravenous contrast. CONTRAST:  74mL GADAVIST GADOBUTROL 1 MMOL/ML IV SOLN COMPARISON:  01/27/2015 MRI head. FINDINGS: Please note image quality is degraded by motion artifact, limiting evaluation. Brain: No acute infarct or intracranial hemorrhage. No midline shift, ventriculomegaly or extra-axial fluid collection. No mass lesion. No abnormal enhancement. Moderate cerebral atrophy with ex vacuo dilatation. Minimal chronic microvascular ischemic changes. Vascular: Major intracranial flow voids are  grossly preserved. Left frontal developmental venous anomaly, unchanged. Skull and upper cervical spine: No focal lesions. Sinuses/Orbits: Motion artifact limits evaluation of the orbits. Clear paranasal sinuses and mastoid  air cells. Other: None. IMPRESSION: No acute intracranial process. No evidence of intracranial metastases. Moderate cerebral atrophy and minimal chronic microvascular ischemic changes. Motion degraded exam. Electronically Signed   By: Primitivo Gauze M.D.   On: 01/26/2020 11:49   DG Chest Port 1 View  Result Date: 01/26/2020 CLINICAL DATA:  Dyspnea.  History of lung cancer EXAM: PORTABLE CHEST 1 VIEW COMPARISON:  12/09/2019 FINDINGS: Right upper lobe masslike density stable.  Remaining lungs are clear Heart size and pulmonary vascularity normal. Port-A-Cath in the SVC unchanged. IMPRESSION: Right upper lobe mass lesion stable.  No acute findings. Electronically Signed   By: Franchot Gallo M.D.   On: 01/26/2020 09:43    Procedures Procedures (including critical care time)  Medications Ordered in ED Medications  sodium chloride 0.9 % bolus 500 mL (500 mLs Intravenous New Bag/Given 01/26/20 0932)    ED Course  I have reviewed the triage vital signs and the nursing notes.  Pertinent labs & imaging results that were available during my care of the patient were reviewed by me and considered in my medical decision making (see chart for details).    MDM Rules/Calculators/A&P                          MDM  Screen complete  Mozell Hardacre Spickler was evaluated in Emergency Department on 01/26/2020 for the symptoms described in the history of present illness. He was evaluated in the context of the global COVID-19 pandemic, which necessitated consideration that the patient might be at risk for infection with the SARS-CoV-2 virus that causes COVID-19. Institutional protocols and algorithms that pertain to the evaluation of patients at risk for COVID-19 are in a state of rapid change  based on information released by regulatory bodies including the CDC and federal and state organizations. These policies and algorithms were followed during the patient's care in the ED.  Patient presents with his sister for evaluation of reported tremor. This appears to be a somewhat chronic complaint.   Work up in the ED is without acute findings. Of note, MR Brain is without acute findings.   Patient appears appropriate for discharge. Importance of close follow up is stressed.   Strict return precautions given and understood.     Final Clinical Impression(s) / ED Diagnoses Final diagnoses:  Tremor    Rx / DC Orders ED Discharge Orders    None       Valarie Merino, MD 01/26/20 1220

## 2020-01-27 ENCOUNTER — Telehealth: Payer: Self-pay | Admitting: Medical Oncology

## 2020-01-27 NOTE — Telephone Encounter (Signed)
Per Dr Julien Nordmann, Earlie Server notified that pt brain scan negative for metastasis and to contact provider who prescribes his Cogentin and Haldol.

## 2020-02-02 DIAGNOSIS — Z79899 Other long term (current) drug therapy: Secondary | ICD-10-CM | POA: Diagnosis not present

## 2020-02-02 DIAGNOSIS — R112 Nausea with vomiting, unspecified: Secondary | ICD-10-CM | POA: Diagnosis not present

## 2020-02-02 DIAGNOSIS — C3411 Malignant neoplasm of upper lobe, right bronchus or lung: Secondary | ICD-10-CM | POA: Diagnosis not present

## 2020-02-07 ENCOUNTER — Emergency Department (HOSPITAL_COMMUNITY): Payer: Medicare Other

## 2020-02-07 ENCOUNTER — Encounter (HOSPITAL_COMMUNITY): Payer: Self-pay

## 2020-02-07 ENCOUNTER — Emergency Department (HOSPITAL_COMMUNITY)
Admission: EM | Admit: 2020-02-07 | Discharge: 2020-02-07 | Disposition: A | Discharge status: Home / Self Care | Payer: Medicare Other | Attending: Emergency Medicine | Admitting: Emergency Medicine

## 2020-02-07 ENCOUNTER — Other Ambulatory Visit: Payer: Self-pay

## 2020-02-07 DIAGNOSIS — Z87891 Personal history of nicotine dependence: Secondary | ICD-10-CM | POA: Diagnosis not present

## 2020-02-07 DIAGNOSIS — R059 Cough, unspecified: Secondary | ICD-10-CM | POA: Insufficient documentation

## 2020-02-07 DIAGNOSIS — J449 Chronic obstructive pulmonary disease, unspecified: Secondary | ICD-10-CM | POA: Insufficient documentation

## 2020-02-07 DIAGNOSIS — R110 Nausea: Secondary | ICD-10-CM | POA: Diagnosis not present

## 2020-02-07 DIAGNOSIS — R531 Weakness: Secondary | ICD-10-CM | POA: Diagnosis not present

## 2020-02-07 DIAGNOSIS — Z20822 Contact with and (suspected) exposure to covid-19: Secondary | ICD-10-CM | POA: Insufficient documentation

## 2020-02-07 DIAGNOSIS — R197 Diarrhea, unspecified: Secondary | ICD-10-CM | POA: Diagnosis not present

## 2020-02-07 DIAGNOSIS — Z7982 Long term (current) use of aspirin: Secondary | ICD-10-CM | POA: Diagnosis not present

## 2020-02-07 DIAGNOSIS — R000 Tachycardia, unspecified: Secondary | ICD-10-CM | POA: Diagnosis not present

## 2020-02-07 DIAGNOSIS — K5900 Constipation, unspecified: Secondary | ICD-10-CM | POA: Diagnosis not present

## 2020-02-07 DIAGNOSIS — R29898 Other symptoms and signs involving the musculoskeletal system: Secondary | ICD-10-CM | POA: Diagnosis not present

## 2020-02-07 LAB — COMPREHENSIVE METABOLIC PANEL
ALT: 31 U/L (ref 0–44)
AST: 46 U/L — ABNORMAL HIGH (ref 15–41)
Albumin: 3.1 g/dL — ABNORMAL LOW (ref 3.5–5.0)
Alkaline Phosphatase: 35 U/L — ABNORMAL LOW (ref 38–126)
Anion gap: 9 (ref 5–15)
BUN: 8 mg/dL (ref 8–23)
CO2: 26 mmol/L (ref 22–32)
Calcium: 8.4 mg/dL — ABNORMAL LOW (ref 8.9–10.3)
Chloride: 97 mmol/L — ABNORMAL LOW (ref 98–111)
Creatinine, Ser: 0.89 mg/dL (ref 0.61–1.24)
GFR calc Af Amer: >60 mL/min (ref >60)
GFR calc non Af Amer: >60 mL/min (ref >60)
Glucose, Bld: 96 mg/dL (ref 70–99)
Potassium: 3.6 mmol/L (ref 3.5–5.1)
Sodium: 132 mmol/L — ABNORMAL LOW (ref 135–145)
Total Bilirubin: 0.9 mg/dL (ref 0.3–1.2)
Total Protein: 6.7 g/dL (ref 6.5–8.1)

## 2020-02-07 LAB — URINALYSIS, ROUTINE W REFLEX MICROSCOPIC
Bilirubin Urine: NEGATIVE
Glucose, UA: NEGATIVE mg/dL
Hgb urine dipstick: NEGATIVE
Ketones, ur: 20 mg/dL — AB
Nitrite: NEGATIVE
Protein, ur: NEGATIVE mg/dL
Specific Gravity, Urine: 1.014 (ref 1.005–1.030)
pH: 5 (ref 5.0–8.0)

## 2020-02-07 LAB — CBC WITH DIFFERENTIAL/PLATELET
Abs Immature Granulocytes: 0 10*3/uL (ref 0.00–0.07)
Basophils Absolute: 0 10*3/uL (ref 0.0–0.1)
Basophils Relative: 0 %
Eosinophils Absolute: 0.1 10*3/uL (ref 0.0–0.5)
Eosinophils Relative: 1 %
HCT: 32.3 % — ABNORMAL LOW (ref 39.0–52.0)
Hemoglobin: 10.7 g/dL — ABNORMAL LOW (ref 13.0–17.0)
Immature Granulocytes: 0 %
Lymphocytes Relative: 28 %
Lymphs Abs: 1.3 10*3/uL (ref 0.7–4.0)
MCH: 28.6 pg (ref 26.0–34.0)
MCHC: 33.1 g/dL (ref 30.0–36.0)
MCV: 86.4 fL (ref 80.0–100.0)
Monocytes Absolute: 0.9 10*3/uL (ref 0.1–1.0)
Monocytes Relative: 19 %
Neutro Abs: 2.5 10*3/uL (ref 1.7–7.7)
Neutrophils Relative %: 52 %
Platelets: 311 10*3/uL (ref 150–400)
RBC: 3.74 MIL/uL — ABNORMAL LOW (ref 4.22–5.81)
RDW: 15.4 % (ref 11.5–15.5)
WBC: 4.8 10*3/uL (ref 4.0–10.5)
nRBC: 0 % (ref 0.0–0.2)

## 2020-02-07 LAB — I-STAT CHEM 8, ED
BUN: 5 mg/dL — ABNORMAL LOW (ref 8–23)
Calcium, Ion: 1.12 mmol/L — ABNORMAL LOW (ref 1.15–1.40)
Chloride: 94 mmol/L — ABNORMAL LOW (ref 98–111)
Creatinine, Ser: 0.8 mg/dL (ref 0.61–1.24)
Glucose, Bld: 93 mg/dL (ref 70–99)
HCT: 32 % — ABNORMAL LOW (ref 39.0–52.0)
Hemoglobin: 10.9 g/dL — ABNORMAL LOW (ref 13.0–17.0)
Potassium: 3.6 mmol/L (ref 3.5–5.1)
Sodium: 134 mmol/L — ABNORMAL LOW (ref 135–145)
TCO2: 25 mmol/L (ref 22–32)

## 2020-02-07 LAB — RESPIRATORY PANEL BY RT PCR (FLU A&B, COVID)
Influenza A by PCR: NEGATIVE
Influenza B by PCR: NEGATIVE
SARS Coronavirus 2 by RT PCR: NEGATIVE

## 2020-02-07 LAB — LACTIC ACID, PLASMA: Lactic Acid, Venous: 1 mmol/L (ref 0.5–1.9)

## 2020-02-07 MED ORDER — SODIUM CHLORIDE 0.9 % IV BOLUS
500.0000 mL | Freq: Once | INTRAVENOUS | Status: AC
  Administered 2020-02-07: 500 mL via INTRAVENOUS

## 2020-02-07 MED ORDER — HEPARIN SOD (PORK) LOCK FLUSH 100 UNIT/ML IV SOLN
500.0000 [IU] | Freq: Once | INTRAVENOUS | Status: AC
  Administered 2020-02-07: 500 [IU]
  Filled 2020-02-07: qty 5

## 2020-02-07 NOTE — ED Notes (Signed)
deaccessed port after giving 500 heparin bolus.

## 2020-02-07 NOTE — ED Triage Notes (Signed)
Pt presents via EMS from home. Per EMS, family reports increasing difficulty in walking, getting up off the toilet. Pt with increased weakness, hx of lung cancer. Pt reports that he has been able to urinate without difficulty but family reports that he has not been able to do this. Pt is alert per EMS but slow to respond. EMS vitals 121/68, HR 117, R16, SPO2 100% on RA, CBG 130. IV placed by EMS 22g left hand, pt also has a port.

## 2020-02-07 NOTE — ED Notes (Signed)
Pt had an IV placed by EMS, went into the triage room to find IV out of pt's hand.

## 2020-02-07 NOTE — ED Provider Notes (Signed)
[facility] DEPT Provider Note   CSN: [id number] Arrival date & time: 02/07/20  0756     History Chief Complaint  Patient presents with  . Weakness    Ivan Daniels is a 74 y.o. male.  Patient is a 74 year old male with a history of schizophrenia, lung cancer with recurrence in August 2020, currently undergoing chemotherapy, hyperlipidemia, prior alcohol use who presents with weakness.  He has had some unsteadiness and a tremor for a while and has been evaluated both in the ED and with his oncologist.  His sister who he lives with states that he had a fall early this morning where he was in the bathroom and slid down.  He had a hard time getting up and feels weaker than he normally is.  He was able to ambulate back to the bed but with difficulty and with assistance.  He denies any dizziness.  He feels a little weaker than normal.  He has had some nausea for about the last week and has not had a bowel movement in about a week.  He has an occasional cough.  No fevers.  No vomiting or diarrhea.  He recently had an MRI of his brain to assess for metastatic disease given the tremor and this was negative.        Past Medical History:  Diagnosis Date  . ALCOHOL ABUSE 11/12/2009   Qualifier: Diagnosis of  By: Hassell Done FNP, Tori Milks    . Allergy   . Anemia   . Anxiety   . BENIGN PROSTATIC HYPERTROPHY, HX OF 12/19/2006   Qualifier: Diagnosis of  By: Radene Ou MD, [place]    . Blood transfusion without reported diagnosis 2017  . Clotting disorder ([place])    takes PLAVIX  . Depression   . Emphysema of lung ([place])   . Full code status 02/10/2015  . GERD (gastroesophageal reflux disease)   . Hyperlipidemia   . Lung mass 01/25/2015   3.7 x 3.6 cm RUL spiculated lung mass with 2.0 cm right paratracheal lymph node suspicious for bronchogenic CA  . 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 Paranoid schizophrenia ([place]) 10/31/2009    Qualifier: Diagnosis of  By: Jorene Minors, [place]    . Primary spontaneous pneumothorax, left 01/25/2015  . TOBACCO ABUSE 05/24/2009   Qualifier: Diagnosis of  By: Hassell Done FNP, Tori Milks      Patient Active Problem List   Diagnosis Date Noted  . Encounter for antineoplastic immunotherapy 01/01/2019  . Goals of care, counseling/discussion 12/18/2018  . COPD GOLD 0  06/15/2016  . Long term current use of antithrombotics/antiplatelets 06/07/2016  . Abnormal esophagram 06/07/2016  . Esophageal stricture 06/07/2016  . Pulmonary emphysema ([place]) 05/12/2016  . Dysphagia 05/12/2016  . 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 Encounter for antineoplastic chemotherapy 03/22/2015  . Primary cancer of right upper lobe of lung ([place]) 02/17/2015  . Non-small cell lung cancer ([place]) 02/10/2015  . Full code status 02/10/2015  . Primary spontaneous pneumothorax, left 01/25/2015  . Lung mass 01/25/2015  . KERATOSIS 04/12/2010  . 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 Paranoid schizophrenia ([place]) 10/31/2009  . EPISTAXIS 11/19/2008  . 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 UNSPECIFIED ANEMIA 11/12/2007  . ATHEROSCLEROSIS, CEREBRAL 02/18/2007  . WEIGHT LOSS, ABNORMAL 02/18/2007  . DYSLIPIDEMIA 12/19/2006  . 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 CAROTID ARTERY STENOSIS, LEFT 12/19/2006  . BENIGN PROSTATIC HYPERTROPHY, HX OF 12/19/2006    Past Surgical History:  Procedure Laterality Date  . APPENDECTOMY    . CHEST TUBE INSERTION Left   . COLONOSCOPY    . INSERTION CENTRAL VENOUS ACCESS DEVICE W/ SUBCUTANEOUS PORT Right   . LUNG BIOPSY Right 01/27/2015   Procedure: Right Upper Lobe Bronchus BIOPSY;  Surgeon: Grace Isaac, MD;  Location: [facility];  Service: Thoracic;  Laterality: Right;  Marland Kitchen VIDEO BRONCHOSCOPY WITH ENDOBRONCHIAL ULTRASOUND N/A 01/27/2015   Procedure: VIDEO BRONCHOSCOPY WITH ENDOBRONCHIAL ULTRASOUND;  Surgeon: Grace Isaac, MD;  Location: [facility] OR;  Service: Thoracic;   Laterality: N/A;       Family History  Problem Relation Age of Onset  . Breast cancer Sister   . Diabetes Sister   . Hyperlipidemia Sister   . Lung disease Neg Hx   . Colon cancer Neg Hx   . Esophageal cancer Neg Hx   . Stomach cancer Neg Hx   . Pancreatic cancer Neg Hx   . Prostate cancer Neg Hx   . Rectal cancer Neg Hx     Social History   Tobacco Use  . Smoking status: Former Smoker    Packs/day: 0.25    Years: 52.00    Pack years: 13.00    Types: Cigarettes    Quit date: 01/07/2016    Years since quitting: 4.0  . Smokeless tobacco: Never Used  . Tobacco comment: Peak rate of 4-5 cigarettes per day  Vaping Use  . Vaping Use: Never used  Substance Use Topics  . Alcohol use: No    Alcohol/week: 0.0 standard drinks  . Drug use: No    Home Medications Prior to Admission medications   Medication Sig Start Date End Date Taking? Authorizing Provider  ALPHAGAN P 0.1 % SOLN Place 1 drop into both eyes 3 (three) times daily. 05/30/17   [provider]  aspirin EC 81 MG tablet Take 81 mg by mouth 2 (two) times daily.    [provider]  benztropine (COGENTIN) 0.5 MG tablet Take 0.5 mg by mouth 2 (two) times daily.    [provider]  chlorhexidine (PERIDEX) 0.12 % solution 1 mL by Mouth Rinse route as needed.  06/15/15   [provider]  clopidogrel (PLAVIX) 75 MG tablet Take 75 mg by mouth daily.  12/09/12   [provider]  ferrous fumarate (HEMOCYTE - 106 MG FE) 325 (106 FE) MG TABS tablet Take 1 tablet by mouth daily.     [provider]  haloperidol (HALDOL) 5 MG tablet Take 5 mg by mouth at bedtime.  02/21/13   [provider]  Multiple Vitamins-Minerals (MULTIVITAMIN WITH MINERALS) tablet Take 1 tablet by mouth daily.    [provider]  NONFORMULARY OR COMPOUNDED [place]  Combination Pain Cream -  Baclofen 2%, Doxepin 5%, Gabapentin 6%, Topiramate 2%, Pentoxifylline 3% Apply 1-2 grams  to affected area 3-4 times daily Qty. 120 gm 3 refills 07/03/17   Gardiner Barefoot, DPM  ondansetron (ZOFRAN) 8 MG tablet Take 1 tablet (8 mg total) by mouth every 8 (eight) hours as needed for nausea or vomiting. 01/08/19   Curt Bears, MD  simvastatin (ZOCOR) 20 MG tablet Take 20 mg by mouth at bedtime.  02/14/13   [provider]  umeclidinium-vilanterol (ANORO ELLIPTA) 62.5-25 MCG/INH AEPB Inhale 1 puff into the lungs daily. 04/11/19   Tanda Rockers, MD  VYZULTA 0.024 % SOLN Place 1 drop into both eyes daily.  09/19/19   [provider]    Allergies  Benztropine mesylate and Chlorpromazine hcl  Review of Systems   Review of Systems  Constitutional: Negative for chills, diaphoresis, fatigue and fever.  HENT: Negative for congestion, rhinorrhea and sneezing.   Eyes: Negative.   Respiratory: Positive for cough. Negative for chest tightness and shortness of breath.   Cardiovascular: Negative for chest pain and leg swelling.  Gastrointestinal: Positive for constipation. Negative for abdominal pain, blood in stool, diarrhea, nausea and vomiting.  Genitourinary: Negative for difficulty urinating, flank pain, frequency and hematuria.  Musculoskeletal: Negative for arthralgias and back pain.  Skin: Negative for rash.  Neurological: Positive for tremors and weakness (generalized). Negative for dizziness, speech difficulty, numbness and headaches.    Physical Exam Updated Vital Signs BP 111/64   Pulse (!) 113   Temp 99.1 F (37.3 C) (Oral)   Resp (!) 21   Ht 5\' 11"  (1.803 m)   Wt 70.3 kg   SpO2 98%   BMI 21.62 kg/m   Physical Exam Constitutional:      Appearance: He is well-developed.  HENT:     Head: Normocephalic and atraumatic.  Eyes:     Pupils: Pupils are equal, round, and reactive to light.  Cardiovascular:     Rate and Rhythm: Normal rate and regular rhythm.     Heart sounds: Normal heart sounds.  Pulmonary:     Effort: Pulmonary effort is normal.  No respiratory distress.     Breath sounds: Normal breath sounds. No wheezing or rales.  Chest:     Chest wall: No tenderness.  Abdominal:     General: Bowel sounds are normal.     Palpations: Abdomen is soft.     Tenderness: There is no abdominal tenderness. There is no guarding or rebound.  Musculoskeletal:        General: Normal range of motion.     Cervical back: Normal range of motion and neck supple.  Lymphadenopathy:     Cervical: No cervical adenopathy.  Skin:    General: Skin is warm and dry.     Findings: No rash.  Neurological:     Mental Status: He is alert.     Comments: Oriented to person and place only, moves all extremities symmetrically, without focal deficits     ED Results / Procedures / Treatments   Labs (all labs ordered are listed, but only abnormal results are displayed) Labs Reviewed  COMPREHENSIVE METABOLIC PANEL - Abnormal; Notable for the following components:      Result Value   Sodium 132 (*)    Chloride 97 (*)    Calcium 8.4 (*)    Albumin 3.1 (*)    AST 46 (*)    Alkaline Phosphatase 35 (*)    All other components within normal limits  CBC WITH DIFFERENTIAL/PLATELET - Abnormal; Notable for the following components:   RBC 3.74 (*)    Hemoglobin 10.7 (*)    HCT 32.3 (*)    All other components within normal limits  URINALYSIS, ROUTINE W REFLEX MICROSCOPIC - Abnormal; Notable for the following components:   Ketones, ur 20 (*)    Leukocytes,Ua TRACE (*)    Bacteria, UA RARE (*)    All other components within normal limits  I-STAT CHEM 8, ED - Abnormal; Notable for the following components:   Sodium 134 (*)    Chloride 94 (*)    BUN 5 (*)    Calcium, Ion 1.12 (*)    Hemoglobin 10.9 (*)    HCT 32.0 (*)  All other components within normal limits  RESPIRATORY PANEL BY RT PCR (FLU A&B, COVID)  LACTIC ACID, PLASMA    EKG EKG Interpretation  Date/Time:  Saturday February 07 2020 10:29:42 EDT Ventricular Rate:  112 PR Interval:    QRS  Duration: 87 QT Interval:  335 QTC Calculation: 458 R Axis:   87 Text Interpretation: Sinus tachycardia Borderline right axis deviation Borderline T abnormalities, diffuse leads since last tracing no significant change Confirmed by Malvin Johns [phone]) on 02/07/2020 10:37:23 AM   Radiology DG Abdomen 1 View  Result Date: 02/07/2020 CLINICAL DATA:  Constipation. EXAM: ABDOMEN - 1 VIEW COMPARISON:  None. FINDINGS: No abnormal bowel dilatation is noted. Moderate amount of stool is noted throughout the colon. No radio-opaque calculi or other significant radiographic abnormality are seen. IMPRESSION: Moderate stool burden. No evidence of bowel obstruction or ileus. Electronically Signed   By: Marijo Conception M.D.   On: 02/07/2020 10:48   DG Chest Port 1 View  Result Date: 02/07/2020 CLINICAL DATA:  Cough, weakness. EXAM: PORTABLE CHEST 1 VIEW COMPARISON:  January 26, 2020. FINDINGS: Stable cardiac size. No pneumothorax is noted. Right internal jugular Port-A-Cath is unchanged in position. Stable irregular masslike density is noted in right upper lobe. Left lung is clear. Bony thorax is unremarkable. IMPRESSION: Stable irregular masslike density is noted in right upper lobe concerning for neoplasm. No acute abnormality is noted. Electronically Signed   By: Marijo Conception M.D.   On: 02/07/2020 10:30    Procedures Procedures (including critical care time)  Medications Ordered in ED Medications  sodium chloride 0.9 % bolus 500 mL (0 mLs Intravenous Stopped 02/07/20 1232)  sodium chloride 0.9 % bolus 500 mL (500 mLs Intravenous New Bag/Given 02/07/20 1439)    ED Course  I have reviewed the triage vital signs and the nursing notes.  Pertinent labs & imaging results that were available during my care of the patient were reviewed by me and considered in my medical decision making (see chart for details).    MDM Rules/Calculators/A&P                          Patient is a 74 year old male who  is currently being treated for lung cancer who presents with general fatigue. He has had a tremor for a few weeks which is unchanged. He did have a recent MRI within the last 2 weeks that showed no acute abnormalities. No evidence of metastatic disease. The wife does not report any other recent illnesses. No fevers. No vomiting or diarrhea. He has had some constipation. He has no abdominal tenderness on exam. He is alert and oriented and at his baseline mental status per wife. His labs are nonconcerning. There is no suggestions of infection. His Covid test is negative. His chest x-ray does not show evidence of pneumonia. His urinalysis shows no signs of infection. He is mildly tachycardic although it is noted on his previous office visits that he is tachycardic as well there. His heart rate on his last office visits were between 100 and 110. Today he has been running in the 110s. He may have some degree of dehydration and was given IV fluids. His heart rate on my repeat exam was around 112. He is able to ambulate. He does ambulate slowly but his wife says this is fairly typical for him. He has a little bit of a tremor which his wife said he has had for the last  several weeks and is unchanged. He was discharged home in good condition. They have an appointment to follow-up with her oncologist on Wednesday. He was given strict return precautions. Final Clinical Impression(s) / ED Diagnoses Final diagnoses:  Weakness    Rx / DC Orders ED Discharge Orders    None       Malvin Johns, MD 02/07/20 1446

## 2020-02-07 NOTE — ED Notes (Signed)
Pt ambulated in room, ambulated at baseline for pt

## 2020-02-07 NOTE — Discharge Instructions (Signed)
Use the MiraLAX at home for his constipation. Try to encourage him to eat and drink. Follow-up with Dr. Earlie Server next week at your appointment. Return here as needed if he has any worsening symptoms.

## 2020-02-08 NOTE — Progress Notes (Signed)
[facility] OFFICE PROGRESS NOTE  [name], FNP 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 He has disease recurrence in August 2020.  PRIOR THERAPY:  1) Course of concurrent chemoradiation with weekly carboplatin for AUC of 2 and paclitaxel 45 MG/M2. First dose 03/01/2015. He is status post 6 cycles. Last cycle was given 04/12/2015 with partial response. 2) Consolidation chemotherapy with reduced dose carboplatin for AUC of 5 and paclitaxel 175 MG/M2 every 3 weeks, status post 3 cycles.  CURRENT THERAPY: Systemic chemotherapy with carboplatin for AUC of 5, paclitaxel 175 mg/M2 with nivolumab 360 mg IV every 3 weeks in addition to ipilimumab 1 mg/KG every 6 weeks. First dose 01/08/2019. Status postday21ofcycle 9.Starting from cycle #3 the patient will be on maintenance treatment with ipilimumab every 6 weeks and nivolumab every 3 weeks  INTERVAL HISTORY: Ivan Daniels 74 y.o. male returns to the clinic today for a follow-up visit accompanied by his sister.  The patient is feeling fair today.  At his last appointment, he had been endorsing dizziness as well as a tremor.  The patient is on several psychiatric medicines for his history of schizophrenia.  He then presented to the emergency room on 01/26/2020 for the chief complaint of dizziness.  The patient had an MRI performed of his brain which was negative for any evidence of metastatic disease.  He then returned to the emergency room on 02/07/2020 for the chief complaint of weakness.  The patient had a Covid test which was negative.  He had a chest x-ray as well as an abdominal x-ray which was negative.  He received IV  fluids and was discharged.  Today, the patient continues to endorse similar symptoms.  The patient continues to experience generalized weakness and feeling off balance.  He does not have any ambulatory assistance devices such as a cane or walker.  The patient's sister also believes he benefit from a shower chair for safety.   He is also been experiencing nausea recently which is unusual for him.  This typically occurs after eating and after taking his medications.  He has lost approximately 5 pounds since his last appointment.  The patient tried drinking Ensure in the past but does not like the taste.  They would be interested in meeting with a member of the nutritionist team for further evaluation and recommendations.   He denies any recent fever or night sweats.  He denies any chest pain, cough, or hemoptysis.  He is reporting mild dyspnea on exertion.  He denies any diarrhea or constipation.  He denies any headache or visual changes.  He denies any rashes or skin changes.  He is here today for evaluation before starting day 1 of cycle #10.  MEDICAL HISTORY: Past Medical History:  Diagnosis Date  . ALCOHOL ABUSE 11/12/2009   Qualifier: Diagnosis of  By: Hassell Done FNP, Tori Milks    . Allergy   . Anemia   . Anxiety   . BENIGN PROSTATIC HYPERTROPHY, HX OF 12/19/2006   Qualifier: Diagnosis of  By: Radene Ou MD, [place]    . Blood transfusion without reported diagnosis 2017  . Clotting disorder ([place])    takes PLAVIX  . Depression   . Emphysema of lung ([place])   .  Full code status 02/10/2015  . GERD (gastroesophageal reflux disease)   . Hyperlipidemia   . Lung mass 01/25/2015   3.7 x 3.6 cm RUL spiculated lung mass with 2.0 cm right paratracheal lymph node suspicious for bronchogenic CA  . Non-small cell lung cancer ([place]) 01/27/15   non small-cell,squamous cell ca RUL  . Paranoid schizophrenia ([place]) 10/31/2009   Qualifier: Diagnosis of  By: Jorene Minors, [place]    . Primary spontaneous pneumothorax, left  01/25/2015  . TOBACCO ABUSE 05/24/2009   Qualifier: Diagnosis of  By: Hassell Done FNP, [place]      ALLERGIES:  is allergic to benztropine mesylate and chlorpromazine hcl.  MEDICATIONS:  Current Outpatient Medications  Medication Sig Dispense Refill  . aspirin EC 81 MG tablet Take 81 mg by mouth 2 (two) times daily.    . benztropine (COGENTIN) 0.5 MG tablet Take 0.5 mg by mouth 2 (two) times daily.    . chlorhexidine (PERIDEX) 0.12 % solution 1 mL by Mouth Rinse route as needed.   1  . clopidogrel (PLAVIX) 75 MG tablet Take 75 mg by mouth daily.     . ferrous fumarate (HEMOCYTE - 106 MG FE) 325 (106 FE) MG TABS tablet Take 1 tablet by mouth daily.     . haloperidol (HALDOL) 5 MG tablet Take 5 mg by mouth at bedtime.     . Multiple Vitamins-Minerals (MULTIVITAMIN WITH MINERALS) tablet Take 1 tablet by mouth daily.    . NONFORMULARY OR COMPOUNDED Fontanet  Combination Pain Cream -  Baclofen 2%, Doxepin 5%, Gabapentin 6%, Topiramate 2%, Pentoxifylline 3% Apply 1-2 grams to affected area 3-4 times daily Qty. 120 gm 3 refills 1 each 3  . ondansetron (ZOFRAN) 8 MG tablet Take 1 tablet (8 mg total) by mouth every 8 (eight) hours as needed for nausea or vomiting. 20 tablet 0  . simvastatin (ZOCOR) 20 MG tablet Take 20 mg by mouth at bedtime.     Marland Kitchen umeclidinium-vilanterol (ANORO ELLIPTA) 62.5-25 MCG/INH AEPB Inhale 1 puff into the lungs daily. 1 each 11  . VYZULTA 0.024 % SOLN Place 1 drop into both eyes daily.      Current Facility-Administered Medications  Medication Dose Route Frequency Provider Last Rate Last Admin  . 0.9 %  sodium chloride infusion  500 mL Intravenous Continuous Irene Shipper, MD      . 0.9 %  sodium chloride infusion  500 mL Intravenous Continuous Irene Shipper, MD      . 0.9 %  sodium chloride infusion  500 mL Intravenous Continuous Irene Shipper, MD       Facility-Administered Medications Ordered in Other Visits  Medication Dose Route Frequency Provider Last  [place]  . 0.9 %  sodium chloride infusion   Intravenous Once Myda Detwiler L, PA-C      . 0.9 %  sodium chloride infusion   Intravenous Continuous Nely Dedmon L, PA-C      . nivolumab (OPDIVO) 360 mg in sodium chloride 0.9 % 100 mL chemo infusion  360 mg Intravenous Once Curt Bears, MD        SURGICAL HISTORY:  Past Surgical History:  Procedure Laterality Date  . APPENDECTOMY    . CHEST TUBE INSERTION Left   . COLONOSCOPY    . INSERTION CENTRAL VENOUS ACCESS DEVICE W/ SUBCUTANEOUS PORT Right   . LUNG BIOPSY Right 01/27/2015   Procedure: Right Upper Lobe Bronchus BIOPSY;  Surgeon: Grace Isaac, MD;  Location: [facility];  Service: Thoracic;  Laterality: Right;  Marland Kitchen VIDEO BRONCHOSCOPY WITH ENDOBRONCHIAL ULTRASOUND N/A 01/27/2015   Procedure: VIDEO BRONCHOSCOPY WITH ENDOBRONCHIAL ULTRASOUND;  Surgeon: Grace Isaac, MD;  Location: [facility];  Service: Thoracic;  Laterality: N/A;    REVIEW OF SYSTEMS:   Review of Systems  Constitutional: Positive for fatigue, generalized weakness, appetite change, and weight loss.  Negative for chills and fever. HENT: Negative for mouth sores, nosebleeds, sore throat and trouble swallowing.   Eyes: Negative for eye problems and icterus.  Respiratory: Negative for cough, hemoptysis, shortness of breath and wheezing.   Cardiovascular: Negative for chest pain and leg swelling.  Gastrointestinal: Positive for nausea and vomiting.  Negative for abdominal pain, constipation, and diarrhea.  Genitourinary: Negative for bladder incontinence, difficulty urinating, dysuria, frequency and hematuria.   Musculoskeletal: Negative for back pain, gait problem, neck pain and neck stiffness.  Skin: Negative for itching and rash.  Neurological: Positive for dizziness and lightheadedness.  Negative for extremity weakness, gait problem, headaches, and seizures.  Hematological: Negative for adenopathy. Does not bruise/bleed easily.   Psychiatric/Behavioral: Positive for tremor.  Negative for confusion, depression and sleep disturbance. The patient is not nervous/anxious.     PHYSICAL EXAMINATION:  Blood pressure 125/69, pulse (!) 117, temperature 98.1 F (36.7 C), temperature source Tympanic, resp. rate 20, height 5\' 11"  (1.803 m), weight 147 lb 1.6 oz (66.7 kg), SpO2 98 %.  ECOG PERFORMANCE STATUS: 2 - Symptomatic, <50% confined to bed  Physical Exam  Constitutional: Oriented to person, place, and time and thin appearing male and in no distress.  HENT:  Head: Normocephalic and atraumatic.  Mouth/Throat: Oropharynx is clear and moist. No oropharyngeal exudate.  Eyes: Conjunctivae are normal. Right eye exhibits no discharge. Left eye exhibits no discharge. No scleral icterus.  Neck: Normal range of motion. Neck supple.  Cardiovascular: Normal rate, regular rhythm, normal heart sounds and intact distal pulses.   Pulmonary/Chest: Effort normal and breath sounds normal. No respiratory distress. No wheezes. No rales.  Abdominal: Soft. Bowel sounds are normal. Exhibits no distension and no mass. There is no tenderness.  Musculoskeletal: Normal range of motion. Exhibits no edema.  Lymphadenopathy:    No cervical adenopathy.  Neurological: Tremor noted in hand. Alert and oriented to person, place, and time. Exhibits muscle wasting.  The patient was examined in the wheelchair. Skin: Skin is warm and dry. No rash noted. Not diaphoretic. No erythema. No pallor.  Psychiatric: Mood, memory and judgment normal.  Vitals reviewed.  LABORATORY DATA: Lab Results  Component Value Date   WBC 4.0 02/11/2020   HGB 10.2 (L) 02/11/2020   HCT 31.1 (L) 02/11/2020   MCV 84.7 02/11/2020   PLT 286 02/11/2020      Chemistry      Component Value Date/Time   NA 133 (L) 02/11/2020 1425   NA 140 02/08/2017 0822   K 3.4 (L) 02/11/2020 1425   K 4.5 02/08/2017 0822   CL 97 (L) 02/11/2020 1425   CO2 27 02/11/2020 1425   CO2 25  02/08/2017 0822   BUN 9 02/11/2020 1425   BUN 8.8 02/08/2017 0822   CREATININE 0.83 02/11/2020 1425   CREATININE 1.1 02/08/2017 0822      Component Value Date/Time   CALCIUM 9.4 02/11/2020 1425   CALCIUM 9.6 02/08/2017 0822   ALKPHOS 41 02/11/2020 1425   ALKPHOS 53 02/08/2017 0822   AST 43 (H) 02/11/2020 1425   AST 19 02/08/2017 0822   ALT 27 02/11/2020 1425   ALT  9 02/08/2017 0822   BILITOT 0.6 02/11/2020 1425   BILITOT <0.22 02/08/2017 9485       RADIOGRAPHIC STUDIES:  DG Abdomen 1 View  Result Date: 02/07/2020 CLINICAL DATA:  Constipation. EXAM: ABDOMEN - 1 VIEW COMPARISON:  None. FINDINGS: No abnormal bowel dilatation is noted. Moderate amount of stool is noted throughout the colon. No radio-opaque calculi or other significant radiographic abnormality are seen. IMPRESSION: Moderate stool burden. No evidence of bowel obstruction or ileus. Electronically Signed   By: Marijo Conception M.D.   On: 02/07/2020 10:48   MR Brain W and Wo Contrast  Result Date: 01/26/2020 CLINICAL DATA:  Metastatic disease evaluation, dizziness and nausea. EXAM: MRI HEAD WITHOUT AND WITH CONTRAST TECHNIQUE: Multiplanar, multiecho pulse sequences of the brain and surrounding structures were obtained without and with intravenous contrast. CONTRAST:  67mL GADAVIST GADOBUTROL 1 MMOL/ML IV SOLN COMPARISON:  01/27/2015 MRI head. FINDINGS: Please note image quality is degraded by motion artifact, limiting evaluation. Brain: No acute infarct or intracranial hemorrhage. No midline shift, ventriculomegaly or extra-axial fluid collection. No mass lesion. No abnormal enhancement. Moderate cerebral atrophy with ex vacuo dilatation. Minimal chronic microvascular ischemic changes. Vascular: Major intracranial flow voids are grossly preserved. Left frontal developmental venous anomaly, unchanged. Skull and upper cervical spine: No focal lesions. Sinuses/Orbits: Motion artifact limits evaluation of the orbits. Clear paranasal  sinuses and mastoid air cells. Other: None. IMPRESSION: No acute intracranial process. No evidence of intracranial metastases. Moderate cerebral atrophy and minimal chronic microvascular ischemic changes. Motion degraded exam. Electronically Signed   By: Primitivo Gauze M.D.   On: 01/26/2020 11:49   DG Chest Port 1 View  Result Date: 02/07/2020 CLINICAL DATA:  Cough, weakness. EXAM: PORTABLE CHEST 1 VIEW COMPARISON:  January 26, 2020. FINDINGS: Stable cardiac size. No pneumothorax is noted. Right internal jugular Port-A-Cath is unchanged in position. Stable irregular masslike density is noted in right upper lobe. Left lung is clear. Bony thorax is unremarkable. IMPRESSION: Stable irregular masslike density is noted in right upper lobe concerning for neoplasm. No acute abnormality is noted. Electronically Signed   By: Marijo Conception M.D.   On: 02/07/2020 10:30   DG Chest Port 1 View  Result Date: 01/26/2020 CLINICAL DATA:  Dyspnea.  History of lung cancer EXAM: PORTABLE CHEST 1 VIEW COMPARISON:  12/09/2019 FINDINGS: Right upper lobe masslike density stable.  Remaining lungs are clear Heart size and pulmonary vascularity normal. Port-A-Cath in the SVC unchanged. IMPRESSION: Right upper lobe mass lesion stable.  No acute findings. Electronically Signed   By: Franchot Gallo M.D.   On: 01/26/2020 09:43     ASSESSMENT/PLAN:  This isa very pleasant 74 year old African-American malewith recurrent non-small cell lung cancer, initiallydiagnosed with stage IIIa non-small cell lung cancer, squamous cell carcinoma. He was diagnosed in April 2016.He presented with a right upper lobe lung mass and right hilar and mediastinal lymphadenopathy. He had disease recurrence on August 2020.  He isstatuspost concurrent chemoradiation followed by consolidation chemotherapy.He has beenon observation for over 3years.The patient showed evidence of disease recurrence in August 2020  He is currently  undergoing treatment with carboplatin for an AUC of 5, paclitaxel 175 mg/m in combination with immunotherapy with nivolumab 360 mg IV every 3 weeks andipilimumab1 mg/kg IV every 6 weeks. The patient is status post cycle #9 day21. Starting from cycle #2,patient has been maintenance treatment with immunotherapy with nivolumab 360 mg IV every 3 weeks andipilimumab1 mg/kg IV every 6 weeks.  The patient was seen with  Dr. Julien Nordmann. Labs were reviewed. Recommend that he proceed with cycle 10 Day 1 today as scheduled.   We will see him back for a follow up visit in 3 weeks for evaluation before starting day 21 cycle #10.  Regarding the patient's nausea, this is not a common side effect of his treatment with nivolumab.  The patient also has been endorsing dizziness and tremors.  The patient's psychiatric medications were recently changed.  Dr. Julien Nordmann believes that his symptoms are likely secondary to medication side effect and recommends following up with the patient's psychiatrist, who he is scheduled to see next month.  Regarding the weakness, we will arrange for the patient to receive 1 L of fluid while in the clinic today.  Additionally I have placed a referral to a member of the nutritionist team for further evaluation and recommendations regarding his weight loss.  I have requested that this be a telephone visit so that the patient sister may be a part of this visit.   We will also arrange for the patient to have a shower chair as well as a walker due to to his weakness.  The patient was advised to call immediately if he has any concerning symptoms in the interval. The patient voices understanding of current disease status and treatment options and is in agreement with the current care plan. All questions were answered. The patient knows to call the clinic with any problems, questions or concerns. We can certainly see the patient much sooner if necessary  Orders Placed This Encounter   Procedures  . For home use only DME Other see comment    Shower chair and walker    Order Specific Question:   Length of Need    Answer:   12 Months  . Ambulatory referral to Nutrition and Diabetic E    Referral Priority:   Routine    Referral Type:   Consultation    Referral Reason:   Specialty Services Required    Number of Visits Requested:   [place], PA-C 02/11/20  ADDENDUM: Hematology/Oncology Attending: I had a face-to-face encounter with the patient today.  I recommended his care plan.  This is a very pleasant 74 years old African-American male with recurrent non-small cell lung cancer, squamous cell carcinoma status post systemic chemotherapy with carboplatin, paclitaxel, ipilimumab and nivolumab for 2 cycles and he is currently undergoing maintenance treatment with ipilimumab and nivolumab.  He is tolerating his treatment well with no concerning adverse effects.  The patient has more confusion and weakness recently as well as tremor.  He has multiple changes of his psychiatric medication over the last few months which may have been contributing to his changes.  He has abdominal pain and he had x-ray of the abdomen that showed no evidence for obstruction or ileus. I recommended for the patient to proceed with his treatment with ipilimumab and nivolumab today as planned. His TSH is very low on the lab work performed earlier today and the patient may be developing hyperparathyroidism secondary to his immunotherapy or his other medications. We will order T3 and T4 and consider referring the patient to endocrinology if he has persistent hyper parathyroidism. The patient will come back for follow-up visit in 3 weeks for evaluation before the next treatment. He was advised to call immediately if he has any concerning symptoms in the interval.  Disclaimer: This note was dictated with voice recognition software. Similar sounding words can inadvertently be transcribed  and may be missed upon review. Eilleen Kempf, MD 02/11/20

## 2020-02-11 ENCOUNTER — Inpatient Hospital Stay: Payer: Medicare Other

## 2020-02-11 ENCOUNTER — Other Ambulatory Visit: Payer: Self-pay

## 2020-02-11 ENCOUNTER — Encounter: Payer: Self-pay | Admitting: Physician Assistant

## 2020-02-11 ENCOUNTER — Inpatient Hospital Stay: Payer: Medicare Other | Attending: Internal Medicine | Admitting: Physician Assistant

## 2020-02-11 VITALS — BP 125/69 | HR 117 | Temp 98.1°F | Resp 20 | Ht 71.0 in | Wt 147.1 lb

## 2020-02-11 DIAGNOSIS — J189 Pneumonia, unspecified organism: Secondary | ICD-10-CM | POA: Diagnosis not present

## 2020-02-11 DIAGNOSIS — A419 Sepsis, unspecified organism: Secondary | ICD-10-CM | POA: Diagnosis not present

## 2020-02-11 DIAGNOSIS — Z5112 Encounter for antineoplastic immunotherapy: Secondary | ICD-10-CM

## 2020-02-11 DIAGNOSIS — Z66 Do not resuscitate: Secondary | ICD-10-CM | POA: Diagnosis not present

## 2020-02-11 DIAGNOSIS — C3411 Malignant neoplasm of upper lobe, right bronchus or lung: Secondary | ICD-10-CM

## 2020-02-11 DIAGNOSIS — R110 Nausea: Secondary | ICD-10-CM | POA: Insufficient documentation

## 2020-02-11 DIAGNOSIS — C3491 Malignant neoplasm of unspecified part of right bronchus or lung: Secondary | ICD-10-CM

## 2020-02-11 DIAGNOSIS — R531 Weakness: Secondary | ICD-10-CM

## 2020-02-11 DIAGNOSIS — R42 Dizziness and giddiness: Secondary | ICD-10-CM | POA: Insufficient documentation

## 2020-02-11 DIAGNOSIS — J690 Pneumonitis due to inhalation of food and vomit: Secondary | ICD-10-CM | POA: Diagnosis not present

## 2020-02-11 DIAGNOSIS — Z20822 Contact with and (suspected) exposure to covid-19: Secondary | ICD-10-CM | POA: Diagnosis not present

## 2020-02-11 DIAGNOSIS — R634 Abnormal weight loss: Secondary | ICD-10-CM

## 2020-02-11 DIAGNOSIS — J449 Chronic obstructive pulmonary disease, unspecified: Secondary | ICD-10-CM

## 2020-02-11 DIAGNOSIS — G9341 Metabolic encephalopathy: Secondary | ICD-10-CM | POA: Diagnosis not present

## 2020-02-11 DIAGNOSIS — R5383 Other fatigue: Secondary | ICD-10-CM

## 2020-02-11 DIAGNOSIS — R410 Disorientation, unspecified: Secondary | ICD-10-CM | POA: Insufficient documentation

## 2020-02-11 DIAGNOSIS — F209 Schizophrenia, unspecified: Secondary | ICD-10-CM | POA: Insufficient documentation

## 2020-02-11 DIAGNOSIS — C3490 Malignant neoplasm of unspecified part of unspecified bronchus or lung: Secondary | ICD-10-CM | POA: Diagnosis not present

## 2020-02-11 DIAGNOSIS — J869 Pyothorax without fistula: Secondary | ICD-10-CM | POA: Diagnosis not present

## 2020-02-11 DIAGNOSIS — R251 Tremor, unspecified: Secondary | ICD-10-CM | POA: Insufficient documentation

## 2020-02-11 DIAGNOSIS — Z79899 Other long term (current) drug therapy: Secondary | ICD-10-CM | POA: Insufficient documentation

## 2020-02-11 DIAGNOSIS — E876 Hypokalemia: Secondary | ICD-10-CM | POA: Diagnosis not present

## 2020-02-11 DIAGNOSIS — R000 Tachycardia, unspecified: Secondary | ICD-10-CM | POA: Diagnosis not present

## 2020-02-11 DIAGNOSIS — E871 Hypo-osmolality and hyponatremia: Secondary | ICD-10-CM | POA: Diagnosis not present

## 2020-02-11 DIAGNOSIS — R109 Unspecified abdominal pain: Secondary | ICD-10-CM | POA: Insufficient documentation

## 2020-02-11 LAB — CBC WITH DIFFERENTIAL (CANCER CENTER ONLY)
Abs Immature Granulocytes: 0.02 10*3/uL (ref 0.00–0.07)
Basophils Absolute: 0 10*3/uL (ref 0.0–0.1)
Basophils Relative: 1 %
Eosinophils Absolute: 0.1 10*3/uL (ref 0.0–0.5)
Eosinophils Relative: 2 %
HCT: 31.1 % — ABNORMAL LOW (ref 39.0–52.0)
Hemoglobin: 10.2 g/dL — ABNORMAL LOW (ref 13.0–17.0)
Immature Granulocytes: 1 %
Lymphocytes Relative: 29 %
Lymphs Abs: 1.1 10*3/uL (ref 0.7–4.0)
MCH: 27.8 pg (ref 26.0–34.0)
MCHC: 32.8 g/dL (ref 30.0–36.0)
MCV: 84.7 fL (ref 80.0–100.0)
Monocytes Absolute: 0.7 10*3/uL (ref 0.1–1.0)
Monocytes Relative: 16 %
Neutro Abs: 2.1 10*3/uL (ref 1.7–7.7)
Neutrophils Relative %: 51 %
Platelet Count: 286 10*3/uL (ref 150–400)
RBC: 3.67 MIL/uL — ABNORMAL LOW (ref 4.22–5.81)
RDW: 15.5 % (ref 11.5–15.5)
WBC Count: 4 10*3/uL (ref 4.0–10.5)
nRBC: 0 % (ref 0.0–0.2)

## 2020-02-11 LAB — CMP (CANCER CENTER ONLY)
ALT: 27 U/L (ref 0–44)
AST: 43 U/L — ABNORMAL HIGH (ref 15–41)
Albumin: 2.8 g/dL — ABNORMAL LOW (ref 3.5–5.0)
Alkaline Phosphatase: 41 U/L (ref 38–126)
Anion gap: 9 (ref 5–15)
BUN: 9 mg/dL (ref 8–23)
CO2: 27 mmol/L (ref 22–32)
Calcium: 9.4 mg/dL (ref 8.9–10.3)
Chloride: 97 mmol/L — ABNORMAL LOW (ref 98–111)
Creatinine: 0.83 mg/dL (ref 0.61–1.24)
GFR, Estimated: >60 mL/min (ref >60)
Glucose, Bld: 90 mg/dL (ref 70–99)
Potassium: 3.4 mmol/L — ABNORMAL LOW (ref 3.5–5.1)
Sodium: 133 mmol/L — ABNORMAL LOW (ref 135–145)
Total Bilirubin: 0.6 mg/dL (ref 0.3–1.2)
Total Protein: 6.9 g/dL (ref 6.5–8.1)

## 2020-02-11 LAB — TSH: TSH: <0.08 u[IU]/mL — ABNORMAL LOW (ref 0.320–4.118)

## 2020-02-11 MED ORDER — SODIUM CHLORIDE 0.9% FLUSH
10.0000 mL | INTRAVENOUS | Status: DC | PRN
  Administered 2020-02-11: 10 mL
  Filled 2020-02-11: qty 10

## 2020-02-11 MED ORDER — SODIUM CHLORIDE 0.9 % IV SOLN
Freq: Once | INTRAVENOUS | Status: AC
  Filled 2020-02-11: qty 250

## 2020-02-11 MED ORDER — SODIUM CHLORIDE 0.9 % IV SOLN
360.0000 mg | Freq: Once | INTRAVENOUS | Status: AC
  Administered 2020-02-11: 360 mg via INTRAVENOUS
  Filled 2020-02-11: qty 12

## 2020-02-11 MED ORDER — SODIUM CHLORIDE 0.9 % IV SOLN
INTRAVENOUS | Status: DC
  Filled 2020-02-11: qty 250

## 2020-02-11 NOTE — Patient Instructions (Signed)
[facility] Discharge Instructions for Patients Receiving Chemotherapy  Today you received the following chemotherapy agents Opdivo  To help prevent nausea and vomiting after your treatment, we encourage you to take your nausea medication as directed   If you develop nausea and vomiting that is not controlled by your nausea medication, call the clinic.   BELOW ARE SYMPTOMS THAT SHOULD BE REPORTED IMMEDIATELY:  *FEVER GREATER THAN 100.5 F  *CHILLS WITH OR WITHOUT FEVER  NAUSEA AND VOMITING THAT IS NOT CONTROLLED WITH YOUR NAUSEA MEDICATION  *UNUSUAL SHORTNESS OF BREATH  *UNUSUAL BRUISING OR BLEEDING  TENDERNESS IN MOUTH AND THROAT WITH OR WITHOUT PRESENCE OF ULCERS  *URINARY PROBLEMS  *BOWEL PROBLEMS  UNUSUAL RASH Items with * indicate a potential emergency and should be followed up as soon as possible.  Feel free to call the clinic should you have any questions or concerns. The clinic phone number is [phone].  Please show the CHEMO ALERT CARD at check-in to the Emergency Department and triage nurse.   

## 2020-02-11 NOTE — Patient Instructions (Signed)

## 2020-02-12 ENCOUNTER — Inpatient Hospital Stay (HOSPITAL_COMMUNITY): Payer: Medicare Other

## 2020-02-12 ENCOUNTER — Other Ambulatory Visit: Payer: Self-pay

## 2020-02-12 ENCOUNTER — Inpatient Hospital Stay (HOSPITAL_COMMUNITY)
Admission: EM | Admit: 2020-02-12 | Discharge: 2020-03-08 | DRG: 177 | Disposition: E | Payer: Medicare Other | Attending: Internal Medicine | Admitting: Internal Medicine

## 2020-02-12 ENCOUNTER — Emergency Department (HOSPITAL_COMMUNITY): Payer: Medicare Other

## 2020-02-12 ENCOUNTER — Encounter (HOSPITAL_COMMUNITY): Payer: Self-pay

## 2020-02-12 DIAGNOSIS — Z66 Do not resuscitate: Secondary | ICD-10-CM | POA: Diagnosis present

## 2020-02-12 DIAGNOSIS — Z6820 Body mass index (BMI) 20.0-20.9, adult: Secondary | ICD-10-CM

## 2020-02-12 DIAGNOSIS — J439 Emphysema, unspecified: Secondary | ICD-10-CM | POA: Diagnosis present

## 2020-02-12 DIAGNOSIS — Z7902 Long term (current) use of antithrombotics/antiplatelets: Secondary | ICD-10-CM

## 2020-02-12 DIAGNOSIS — F200 Paranoid schizophrenia: Secondary | ICD-10-CM | POA: Diagnosis present

## 2020-02-12 DIAGNOSIS — F17210 Nicotine dependence, cigarettes, uncomplicated: Secondary | ICD-10-CM | POA: Diagnosis present

## 2020-02-12 DIAGNOSIS — C3491 Malignant neoplasm of unspecified part of right bronchus or lung: Secondary | ICD-10-CM | POA: Diagnosis not present

## 2020-02-12 DIAGNOSIS — D689 Coagulation defect, unspecified: Secondary | ICD-10-CM | POA: Diagnosis present

## 2020-02-12 DIAGNOSIS — E871 Hypo-osmolality and hyponatremia: Secondary | ICD-10-CM

## 2020-02-12 DIAGNOSIS — N179 Acute kidney failure, unspecified: Secondary | ICD-10-CM | POA: Diagnosis present

## 2020-02-12 DIAGNOSIS — Z803 Family history of malignant neoplasm of breast: Secondary | ICD-10-CM

## 2020-02-12 DIAGNOSIS — R000 Tachycardia, unspecified: Secondary | ICD-10-CM | POA: Diagnosis not present

## 2020-02-12 DIAGNOSIS — N400 Enlarged prostate without lower urinary tract symptoms: Secondary | ICD-10-CM | POA: Diagnosis present

## 2020-02-12 DIAGNOSIS — T17908A Unspecified foreign body in respiratory tract, part unspecified causing other injury, initial encounter: Secondary | ICD-10-CM | POA: Diagnosis not present

## 2020-02-12 DIAGNOSIS — R1319 Other dysphagia: Secondary | ICD-10-CM | POA: Diagnosis not present

## 2020-02-12 DIAGNOSIS — J690 Pneumonitis due to inhalation of food and vomit: Principal | ICD-10-CM | POA: Diagnosis present

## 2020-02-12 DIAGNOSIS — R1310 Dysphagia, unspecified: Secondary | ICD-10-CM

## 2020-02-12 DIAGNOSIS — R1110 Vomiting, unspecified: Secondary | ICD-10-CM | POA: Diagnosis not present

## 2020-02-12 DIAGNOSIS — J189 Pneumonia, unspecified organism: Secondary | ICD-10-CM | POA: Diagnosis not present

## 2020-02-12 DIAGNOSIS — Z20822 Contact with and (suspected) exposure to covid-19: Secondary | ICD-10-CM | POA: Diagnosis present

## 2020-02-12 DIAGNOSIS — D630 Anemia in neoplastic disease: Secondary | ICD-10-CM | POA: Diagnosis present

## 2020-02-12 DIAGNOSIS — Z923 Personal history of irradiation: Secondary | ICD-10-CM

## 2020-02-12 DIAGNOSIS — Z515 Encounter for palliative care: Secondary | ICD-10-CM

## 2020-02-12 DIAGNOSIS — R112 Nausea with vomiting, unspecified: Secondary | ICD-10-CM | POA: Diagnosis not present

## 2020-02-12 DIAGNOSIS — J910 Malignant pleural effusion: Secondary | ICD-10-CM | POA: Diagnosis not present

## 2020-02-12 DIAGNOSIS — R110 Nausea: Secondary | ICD-10-CM | POA: Diagnosis not present

## 2020-02-12 DIAGNOSIS — G9341 Metabolic encephalopathy: Secondary | ICD-10-CM | POA: Diagnosis present

## 2020-02-12 DIAGNOSIS — R0600 Dyspnea, unspecified: Secondary | ICD-10-CM | POA: Diagnosis not present

## 2020-02-12 DIAGNOSIS — Z7951 Long term (current) use of inhaled steroids: Secondary | ICD-10-CM

## 2020-02-12 DIAGNOSIS — Z7982 Long term (current) use of aspirin: Secondary | ICD-10-CM

## 2020-02-12 DIAGNOSIS — C3411 Malignant neoplasm of upper lobe, right bronchus or lung: Secondary | ICD-10-CM | POA: Diagnosis not present

## 2020-02-12 DIAGNOSIS — Z7189 Other specified counseling: Secondary | ICD-10-CM | POA: Diagnosis not present

## 2020-02-12 DIAGNOSIS — R630 Anorexia: Secondary | ICD-10-CM | POA: Diagnosis present

## 2020-02-12 DIAGNOSIS — Z833 Family history of diabetes mellitus: Secondary | ICD-10-CM

## 2020-02-12 DIAGNOSIS — J869 Pyothorax without fistula: Secondary | ICD-10-CM | POA: Diagnosis present

## 2020-02-12 DIAGNOSIS — E876 Hypokalemia: Secondary | ICD-10-CM | POA: Diagnosis present

## 2020-02-12 DIAGNOSIS — K222 Esophageal obstruction: Secondary | ICD-10-CM | POA: Diagnosis present

## 2020-02-12 DIAGNOSIS — E785 Hyperlipidemia, unspecified: Secondary | ICD-10-CM | POA: Diagnosis present

## 2020-02-12 DIAGNOSIS — I5021 Acute systolic (congestive) heart failure: Secondary | ICD-10-CM | POA: Diagnosis not present

## 2020-02-12 DIAGNOSIS — Z9221 Personal history of antineoplastic chemotherapy: Secondary | ICD-10-CM

## 2020-02-12 DIAGNOSIS — T17908S Unspecified foreign body in respiratory tract, part unspecified causing other injury, sequela: Secondary | ICD-10-CM | POA: Diagnosis not present

## 2020-02-12 DIAGNOSIS — J90 Pleural effusion, not elsewhere classified: Secondary | ICD-10-CM | POA: Diagnosis present

## 2020-02-12 DIAGNOSIS — Z9889 Other specified postprocedural states: Secondary | ICD-10-CM

## 2020-02-12 DIAGNOSIS — Z83438 Family history of other disorder of lipoprotein metabolism and other lipidemia: Secondary | ICD-10-CM

## 2020-02-12 DIAGNOSIS — C3490 Malignant neoplasm of unspecified part of unspecified bronchus or lung: Secondary | ICD-10-CM | POA: Diagnosis present

## 2020-02-12 DIAGNOSIS — A419 Sepsis, unspecified organism: Secondary | ICD-10-CM | POA: Diagnosis not present

## 2020-02-12 DIAGNOSIS — R54 Age-related physical debility: Secondary | ICD-10-CM | POA: Diagnosis present

## 2020-02-12 DIAGNOSIS — R0602 Shortness of breath: Secondary | ICD-10-CM | POA: Diagnosis not present

## 2020-02-12 DIAGNOSIS — K219 Gastro-esophageal reflux disease without esophagitis: Secondary | ICD-10-CM | POA: Diagnosis present

## 2020-02-12 DIAGNOSIS — Z79899 Other long term (current) drug therapy: Secondary | ICD-10-CM

## 2020-02-12 LAB — CBC
HCT: 31.6 % — ABNORMAL LOW (ref 39.0–52.0)
HCT: 30 % — ABNORMAL LOW (ref 39.0–52.0)
Hemoglobin: 10.1 g/dL — ABNORMAL LOW (ref 13.0–17.0)
Hemoglobin: 9.8 g/dL — ABNORMAL LOW (ref 13.0–17.0)
MCH: 28.2 pg (ref 26.0–34.0)
MCH: 28.7 pg (ref 26.0–34.0)
MCHC: 32 g/dL (ref 30.0–36.0)
MCHC: 32.7 g/dL (ref 30.0–36.0)
MCV: 88.3 fL (ref 80.0–100.0)
Platelets: 312 10*3/uL (ref 150–400)
RBC: 3.58 MIL/uL — ABNORMAL LOW (ref 4.22–5.81)
RBC: 3.42 MIL/uL — ABNORMAL LOW (ref 4.22–5.81)
RDW: 15.8 % — ABNORMAL HIGH (ref 11.5–15.5)
RDW: 15.9 % — ABNORMAL HIGH (ref 11.5–15.5)
WBC: 4.9 10*3/uL (ref 4.0–10.5)
WBC: 4.7 10*3/uL (ref 4.0–10.5)
nRBC: 0 % (ref 0.0–0.2)
nRBC: 0 % (ref 0.0–0.2)

## 2020-02-12 LAB — BASIC METABOLIC PANEL
Anion gap: 11 (ref 5–15)
BUN: 9 mg/dL (ref 8–23)
CO2: 22 mmol/L (ref 22–32)
Calcium: 8.3 mg/dL — ABNORMAL LOW (ref 8.9–10.3)
Chloride: 99 mmol/L (ref 98–111)
Creatinine, Ser: 0.92 mg/dL (ref 0.61–1.24)
GFR calc non Af Amer: >60 mL/min (ref >60)
Glucose, Bld: 97 mg/dL (ref 70–99)
Potassium: 3.4 mmol/L — ABNORMAL LOW (ref 3.5–5.1)
Sodium: 132 mmol/L — ABNORMAL LOW (ref 135–145)

## 2020-02-12 LAB — URINALYSIS, ROUTINE W REFLEX MICROSCOPIC
Bilirubin Urine: NEGATIVE
Glucose, UA: NEGATIVE mg/dL
Hgb urine dipstick: NEGATIVE
Ketones, ur: 80 mg/dL — AB
Leukocytes,Ua: NEGATIVE
Nitrite: NEGATIVE
Protein, ur: NEGATIVE mg/dL
Specific Gravity, Urine: 1.019 (ref 1.005–1.030)
pH: 5 (ref 5.0–8.0)

## 2020-02-12 LAB — LACTIC ACID, PLASMA: Lactic Acid, Venous: 1 mmol/L (ref 0.5–1.9)

## 2020-02-12 LAB — STREP PNEUMONIAE URINARY ANTIGEN: Strep Pneumo Urinary Antigen: NEGATIVE

## 2020-02-12 LAB — RESPIRATORY PANEL BY RT PCR (FLU A&B, COVID)
Influenza A by PCR: NEGATIVE
Influenza B by PCR: NEGATIVE
SARS Coronavirus 2 by RT PCR: NEGATIVE

## 2020-02-12 LAB — CBG MONITORING, ED: Glucose-Capillary: 82 mg/dL (ref 70–99)

## 2020-02-12 LAB — MAGNESIUM: Magnesium: 1.9 mg/dL (ref 1.7–2.4)

## 2020-02-12 MED ORDER — SODIUM CHLORIDE 0.9 % IV BOLUS
1000.0000 mL | Freq: Once | INTRAVENOUS | Status: AC
  Administered 2020-02-12: 1000 mL via INTRAVENOUS

## 2020-02-12 MED ORDER — CLOPIDOGREL BISULFATE 75 MG PO TABS
75.0000 mg | ORAL_TABLET | Freq: Every day | ORAL | Status: DC
  Administered 2020-02-13: 75 mg via ORAL
  Filled 2020-02-12 (×2): qty 1

## 2020-02-12 MED ORDER — POTASSIUM CHLORIDE 10 MEQ/100ML IV SOLN
10.0000 meq | INTRAVENOUS | Status: AC
  Administered 2020-02-12 – 2020-02-13 (×4): 10 meq via INTRAVENOUS
  Filled 2020-02-12 (×4): qty 100

## 2020-02-12 MED ORDER — SODIUM CHLORIDE 0.9% FLUSH: 10.0000 mL | INTRAVENOUS | Status: DC | PRN

## 2020-02-12 MED ORDER — HALOPERIDOL 5 MG PO TABS
5.0000 mg | ORAL_TABLET | Freq: Every day | ORAL | Status: DC
  Administered 2020-02-12 – 2020-02-13 (×2): 5 mg via ORAL
  Filled 2020-02-12 (×2): qty 1

## 2020-02-12 MED ORDER — SODIUM CHLORIDE 0.9 % IV SOLN
500.0000 mg | Freq: Once | INTRAVENOUS | Status: AC
  Administered 2020-02-12: 500 mg via INTRAVENOUS
  Filled 2020-02-12: qty 500

## 2020-02-12 MED ORDER — SODIUM CHLORIDE 0.9 % IV SOLN
1.0000 g | Freq: Once | INTRAVENOUS | Status: AC
  Administered 2020-02-12: 1 g via INTRAVENOUS
  Filled 2020-02-12: qty 10

## 2020-02-12 MED ORDER — ACETAMINOPHEN 325 MG PO TABS: 650.0000 mg | ORAL_TABLET | Freq: Four times a day (QID) | ORAL | Status: DC | PRN

## 2020-02-12 MED ORDER — ONDANSETRON HCL 4 MG PO TABS: 4.0000 mg | ORAL_TABLET | Freq: Four times a day (QID) | ORAL | Status: DC | PRN

## 2020-02-12 MED ORDER — CHLORHEXIDINE GLUCONATE 0.12 % MT SOLN: 5.0000 mL | OROMUCOSAL | Status: DC | PRN

## 2020-02-12 MED ORDER — DEXTROSE-NACL 5-0.9 % IV SOLN: INTRAVENOUS | Status: DC

## 2020-02-12 MED ORDER — LATANOPROSTENE BUNOD 0.024 % OP SOLN: 1.0000 [drp] | Freq: Every day | OPHTHALMIC | Status: DC

## 2020-02-12 MED ORDER — ACETAMINOPHEN 650 MG RE SUPP
650.0000 mg | Freq: Four times a day (QID) | RECTAL | Status: DC | PRN
  Administered 2020-02-15 – 2020-02-19 (×3): 650 mg via RECTAL
  Filled 2020-02-12 (×4): qty 1

## 2020-02-12 MED ORDER — ENOXAPARIN SODIUM 40 MG/0.4ML ~~LOC~~ SOLN
40.0000 mg | SUBCUTANEOUS | Status: DC
  Administered 2020-02-12 – 2020-02-13 (×2): 40 mg via SUBCUTANEOUS
  Filled 2020-02-12 (×2): qty 0.4

## 2020-02-12 MED ORDER — UMECLIDINIUM-VILANTEROL 62.5-25 MCG/INH IN AEPB
1.0000 | INHALATION_SPRAY | Freq: Every day | RESPIRATORY_TRACT | Status: DC
  Administered 2020-02-13 – 2020-02-18 (×6): 1 via RESPIRATORY_TRACT
  Filled 2020-02-12: qty 14

## 2020-02-12 MED ORDER — SIMVASTATIN 20 MG PO TABS
20.0000 mg | ORAL_TABLET | Freq: Every day | ORAL | Status: DC
  Administered 2020-02-12 – 2020-02-13 (×2): 20 mg via ORAL
  Filled 2020-02-12 (×2): qty 1

## 2020-02-12 MED ORDER — CHLORHEXIDINE GLUCONATE CLOTH 2 % EX PADS
6.0000 | MEDICATED_PAD | Freq: Every day | CUTANEOUS | Status: DC
  Administered 2020-02-12 – 2020-02-19 (×8): 6 via TOPICAL

## 2020-02-12 MED ORDER — ASPIRIN EC 81 MG PO TBEC
81.0000 mg | DELAYED_RELEASE_TABLET | Freq: Two times a day (BID) | ORAL | Status: DC
  Administered 2020-02-12 – 2020-02-13 (×3): 81 mg via ORAL
  Filled 2020-02-12 (×4): qty 1

## 2020-02-12 MED ORDER — GUAIFENESIN ER 600 MG PO TB12
600.0000 mg | ORAL_TABLET | Freq: Two times a day (BID) | ORAL | Status: DC
  Administered 2020-02-12 – 2020-02-14 (×4): 600 mg via ORAL
  Filled 2020-02-12 (×4): qty 1

## 2020-02-12 MED ORDER — LEVALBUTEROL HCL 0.63 MG/3ML IN NEBU
0.6300 mg | INHALATION_SOLUTION | Freq: Four times a day (QID) | RESPIRATORY_TRACT | Status: DC | PRN
  Administered 2020-02-13: 0.63 mg via RESPIRATORY_TRACT
  Filled 2020-02-12: qty 3

## 2020-02-12 MED ORDER — ONDANSETRON HCL 4 MG/2ML IJ SOLN
4.0000 mg | Freq: Four times a day (QID) | INTRAMUSCULAR | Status: DC | PRN
  Administered 2020-02-13 – 2020-02-17 (×2): 4 mg via INTRAVENOUS
  Filled 2020-02-12 (×2): qty 2

## 2020-02-12 MED ORDER — SODIUM CHLORIDE 0.9 % IV SOLN
500.0000 mg | INTRAVENOUS | Status: AC
  Administered 2020-02-13 – 2020-02-16 (×4): 500 mg via INTRAVENOUS
  Filled 2020-02-12 (×4): qty 500

## 2020-02-12 MED ORDER — SODIUM CHLORIDE 0.9 % IV SOLN
2.0000 g | INTRAVENOUS | Status: AC
  Administered 2020-02-13 – 2020-02-16 (×4): 2 g via INTRAVENOUS
  Filled 2020-02-12 (×2): qty 20
  Filled 2020-02-12 (×2): qty 2

## 2020-02-12 MED ORDER — BENZTROPINE MESYLATE 0.5 MG PO TABS
0.5000 mg | ORAL_TABLET | Freq: Every day | ORAL | Status: DC
  Administered 2020-02-13: 0.5 mg via ORAL
  Filled 2020-02-12 (×2): qty 1

## 2020-02-12 NOTE — Progress Notes (Signed)
MD made aware of yellow MEWS. Patient tachypneic and tachycardic, otherwise stable. Patient was yellow in ED before transfer. Will continue q4 vitals. No new orders.

## 2020-02-12 NOTE — ED Notes (Signed)
This RN called to give report to Hinton Dyer, Therapist, sports. Receiving RN asked for a few more minutes. Will call back.

## 2020-02-12 NOTE — ED Provider Notes (Signed)
[facility] DEPT Provider Note   CSN: [id number] Arrival date & time: 02/11/2020  1038     History Chief Complaint  Patient presents with  . Dizziness    Ivan Daniels is a 74 y.o. male.  HPI 74 year old male with a history of non-small cell lung cancer, anemia, prior alcohol abuse, paranoid schizophrenia, hyperlipidemia, GERD who presents to the ER with ongoing nausea, vomiting and weakness.  History provided largely by his sister at bedside.  She states that he has been having severe weakness and dizziness even at rest, he has not had any falls but there is concerns for this.  Sister relates that the symptoms have been going on for approximately 2 weeks.  Patient has been seen here for similar complaints on 10/2, work-up overall was reassuring, chest x-ray was without acute abnormalities, lab work not significant abnormalities.  He has continued to have similar symptoms, however his sister states that his symptoms not been worsening but staying at baseline.  He was seen yesterday at Dr. Worthy Flank office and was given a shower chair and a walker.  He was told to follow-up with his psychiatrist to reevaluate his psychiatry meds which could be contributing to his dizziness and weakness.  He also has been having nonbloody nonbilious vomiting and poor p.o. intake for the last 2 weeks as well.  Patient sister states that they called the psychiatrist today and he instructed them to go to the ER.  Patient is on Plavix currently.  She states that he has been coughing, but this is at his baseline given his history of lung cancer.  He received his last chemo treatment yesterday.  Denies any fevers, dysuria.  Patient had an MRI done on 9/20 of the brain which did not show any evidence of metastases    Past Medical History:  Diagnosis Date  . ALCOHOL ABUSE 11/12/2009   Qualifier: Diagnosis of  By: Hassell Done FNP, Tori Milks    . Allergy   . Anemia   . Anxiety   . BENIGN  PROSTATIC HYPERTROPHY, HX OF 12/19/2006   Qualifier: Diagnosis of  By: Radene Ou MD, [place]    . Blood transfusion without reported diagnosis 2017  . Clotting disorder ([place])    takes PLAVIX  . Depression   . Emphysema of lung ([place])   . Full code status 02/10/2015  . GERD (gastroesophageal reflux disease)   . Hyperlipidemia   . Lung mass 01/25/2015   3.7 x 3.6 cm RUL spiculated lung mass with 2.0 cm right paratracheal lymph node suspicious for bronchogenic CA  . Non-small cell lung cancer ([place]) 01/27/15   non small-cell,squamous cell ca RUL  . Paranoid schizophrenia ([place]) 10/31/2009   Qualifier: Diagnosis of  By: Jorene Minors, [place]    . Primary spontaneous pneumothorax, left 01/25/2015  . TOBACCO ABUSE 05/24/2009   Qualifier: Diagnosis of  By: Hassell Done FNP, Tori Milks      Patient Active Problem List   Diagnosis Date Noted  . Intractable nausea and vomiting 02/11/2020  . Weakness 02/11/2020  . Encounter for antineoplastic immunotherapy 01/01/2019  . Goals of care, counseling/discussion 12/18/2018  . COPD GOLD 0  06/15/2016  . Long term current use of antithrombotics/antiplatelets 06/07/2016  . Abnormal esophagram 06/07/2016  . Esophageal stricture 06/07/2016  . Pulmonary emphysema ([place]) 05/12/2016  . Dysphagia 05/12/2016  . 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 Encounter for antineoplastic chemotherapy 03/22/2015  . Primary cancer of right upper lobe of lung ([place]) 02/17/2015  . Non-small cell lung  cancer ([place]) 02/10/2015  . Full code status 02/10/2015  . Primary spontaneous pneumothorax, left 01/25/2015  . Lung mass 01/25/2015  . KERATOSIS 04/12/2010  . 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 Paranoid schizophrenia ([place]) 10/31/2009  . EPISTAXIS 11/19/2008  . 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 UNSPECIFIED ANEMIA 11/12/2007  . ATHEROSCLEROSIS, CEREBRAL 02/18/2007  . WEIGHT LOSS, ABNORMAL 02/18/2007  . DYSLIPIDEMIA 12/19/2006  . SUBSTANCE ABUSE, MULTIPLE  12/19/2006  . CAROTID ARTERY STENOSIS, LEFT 12/19/2006  . BENIGN PROSTATIC HYPERTROPHY, HX OF 12/19/2006    Past Surgical History:  Procedure Laterality Date  . APPENDECTOMY    . CHEST TUBE INSERTION Left   . COLONOSCOPY    . INSERTION CENTRAL VENOUS ACCESS DEVICE W/ SUBCUTANEOUS PORT Right   . LUNG BIOPSY Right 01/27/2015   Procedure: Right Upper Lobe Bronchus BIOPSY;  Surgeon: Grace Isaac, MD;  Location: [facility];  Service: Thoracic;  Laterality: Right;  Marland Kitchen VIDEO BRONCHOSCOPY WITH ENDOBRONCHIAL ULTRASOUND N/A 01/27/2015   Procedure: VIDEO BRONCHOSCOPY WITH ENDOBRONCHIAL ULTRASOUND;  Surgeon: Grace Isaac, MD;  Location: [facility] OR;  Service: Thoracic;  Laterality: N/A;       Family History  Problem Relation Age of Onset  . Breast cancer Sister   . Diabetes Sister   . Hyperlipidemia Sister   . Lung disease Neg Hx   . Colon cancer Neg Hx   . Esophageal cancer Neg Hx   . Stomach cancer Neg Hx   . Pancreatic cancer Neg Hx   . Prostate cancer Neg Hx   . Rectal cancer Neg Hx     Social History   Tobacco Use  . Smoking status: Former Smoker    Packs/day: 0.25    Years: 52.00    Pack years: 13.00    Types: Cigarettes    Quit date: 01/07/2016    Years since quitting: 4.1  . Smokeless tobacco: Never Used  . Tobacco comment: Peak rate of 4-5 cigarettes per day  Vaping Use  . Vaping Use: Never used  Substance Use Topics  . Alcohol use: Not Currently    Alcohol/week: 0.0 standard drinks  . Drug use: No    Home Medications Prior to Admission medications   Medication Sig Start Date End Date Taking? Authorizing Provider  aspirin EC 81 MG tablet Take 81 mg by mouth 2 (two) times daily.   Yes [provider]  benztropine (COGENTIN) 0.5 MG tablet Take 0.5 mg by mouth daily.    Yes [provider]  chlorhexidine (PERIDEX) 0.12 % solution 5 mLs by Mouth Rinse route as needed (gingivitis).  06/15/15  Yes [provider]  clopidogrel (PLAVIX) 75 MG tablet  Take 75 mg by mouth daily.  12/09/12  Yes [provider]  ferrous fumarate (HEMOCYTE - 106 MG FE) 325 (106 FE) MG TABS tablet Take 1 tablet by mouth daily.    Yes [provider]  haloperidol (HALDOL) 5 MG tablet Take 5 mg by mouth at bedtime.  02/21/13  Yes [provider]  ondansetron (ZOFRAN) 8 MG tablet Take 1 tablet (8 mg total) by mouth every 8 (eight) hours as needed for nausea or vomiting. 01/08/19  Yes Curt Bears, MD  simvastatin (ZOCOR) 20 MG tablet Take 20 mg by mouth at bedtime.  02/14/13  Yes [provider]  umeclidinium-vilanterol (ANORO ELLIPTA) 62.5-25 MCG/INH AEPB Inhale 1 puff into the lungs daily. 04/11/19  Yes Tanda Rockers, MD  VYZULTA 0.024 % SOLN Place 1 drop into both eyes daily.  09/19/19  Yes [provider]  NONFORMULARY OR COMPOUNDED [place]  Combination Pain Cream -  Baclofen 2%, Doxepin 5%, Gabapentin 6%, Topiramate 2%, Pentoxifylline 3% Apply 1-2 grams to affected area 3-4 times daily Qty. 120 gm 3 refills Patient not taking: Reported on 02/23/2020 07/03/17   Gardiner Barefoot, DPM    Allergies    Benztropine mesylate and Chlorpromazine hcl  Review of Systems   Review of Systems  Constitutional: Positive for chills. Negative for fever.  HENT: Negative for ear pain and sore throat.   Eyes: Negative for pain and visual disturbance.  Respiratory: Positive for cough and shortness of breath.   Cardiovascular: Negative for chest pain and palpitations.  Gastrointestinal: Positive for nausea and vomiting. Negative for abdominal pain.  Genitourinary: Negative for dysuria and hematuria.  Musculoskeletal: Negative for arthralgias and back pain.  Skin: Negative for color change and rash.  Neurological: Positive for dizziness and weakness. Negative for tremors, seizures, syncope, light-headedness and headaches.  All other systems reviewed and are negative.   Physical Exam Updated Vital Signs BP (!) 150/65    Pulse (!) 112   Temp 98.7 F (37.1 C) (Oral)   Resp (!) 30   SpO2 100%   Physical Exam Vitals and nursing note reviewed.  Constitutional:      General: He is not in acute distress.    Appearance: He is well-developed. He is not ill-appearing, toxic-appearing or diaphoretic.     Comments: Chronically ill-appearing male  HENT:     Head: Normocephalic and atraumatic.  Eyes:     Conjunctiva/sclera: Conjunctivae normal.  Cardiovascular:     Rate and Rhythm: Regular rhythm. Tachycardia present.     Heart sounds: No murmur heard.   Pulmonary:     Effort: Pulmonary effort is normal. No respiratory distress.     Breath sounds: Normal breath sounds. No wheezing, rhonchi or rales.     Comments: Decreased in the RLB Abdominal:     Palpations: Abdomen is soft.     Tenderness: There is no abdominal tenderness.  Musculoskeletal:        General: No swelling, tenderness or deformity. Normal range of motion.     Cervical back: Neck supple.     Comments: 5/5 strength upper and lower extremities bilaterally  Skin:    General: Skin is warm and dry.     Capillary Refill: Capillary refill takes less than 2 seconds.     Findings: No erythema or rash.  Neurological:     General: No focal deficit present.     Mental Status: He is alert and oriented to person, place, and time.     Comments: Equal strength in upper and lower extremities, gross sensations intact.  Smile equal, no evidence of word slurring.  Equal eyebrow raise; A&Ox2 (baseline)   Psychiatric:     Comments: Answers questions appropriately, flat affect      ED Results / Procedures / Treatments   Labs (all labs ordered are listed, but only abnormal results are displayed) Labs Reviewed  BASIC METABOLIC PANEL - Abnormal; Notable for the following components:      Result Value   Sodium 132 (*)    Potassium 3.4 (*)    Calcium 8.3 (*)    All other components within normal limits  CBC - Abnormal; Notable for the following  components:   RBC 3.58 (*)    Hemoglobin 10.1 (*)    HCT 31.6 (*)    RDW 15.8 (*)  All other components within normal limits  URINALYSIS, ROUTINE W REFLEX MICROSCOPIC - Abnormal; Notable for the following components:   Ketones, ur 80 (*)    All other components within normal limits  CULTURE, BLOOD (ROUTINE X 2)  RESPIRATORY PANEL BY RT PCR (FLU A&B, COVID)  CULTURE, BLOOD (ROUTINE X 2)  LACTIC ACID, PLASMA  LACTIC ACID, PLASMA  CBG MONITORING, ED    EKG None  Radiology DG Abd 1 View  Result Date: 02/24/2020 CLINICAL DATA:  Decreased appetite EXAM: ABDOMEN - 1 VIEW COMPARISON:  February 07, 2020 FINDINGS: Relatively mild volume of stool. Postoperative change in lower abdomen. No bowel dilatation or air-fluid level to suggest bowel obstruction. No free air. Vascular calcifications noted in the pelvis. Visualized lung bases clear. IMPRESSION: No bowel obstruction or free air on supine examination. Postoperative changes noted. Electronically Signed   By: Lowella Grip III M.D.   On: 03/01/2020 14:24   DG Chest Portable 1 View  Result Date: 02/28/2020 CLINICAL DATA:  Tachycardia.  Lung cancer EXAM: PORTABLE CHEST 1 VIEW COMPARISON:  02/07/2020, 01/26/2020 FINDINGS: Right-sided Port-A-Cath remains in place. Normal heart size. Irregular masslike density within the right upper lobe, grossly unchanged compared to priors. Interval development of hazy opacity within the right lung base and small right pleural effusion. No pneumothorax. IMPRESSION: 1. Interval development of hazy opacity within the right lung base and small right pleural effusion. Findings may represent atelectasis, aspiration and/or pneumonia. 2. Grossly stable appearance of right upper lobe mass. Electronically Signed   By: Davina Poke D.O.   On: 02/08/2020 11:43    Procedures Procedures (including critical care time)  Medications Ordered in ED Medications  azithromycin (ZITHROMAX) 500 mg in sodium chloride 0.9 %  250 mL IVPB (has no administration in time range)  sodium chloride 0.9 % bolus 1,000 mL (0 mLs Intravenous Stopped 03/04/2020 1245)  cefTRIAXone (ROCEPHIN) 1 g in sodium chloride 0.9 % 100 mL IVPB (1 g Intravenous New Bag/Given 02/14/2020 1245)    ED Course  I have reviewed the triage vital signs and the nursing notes.  Pertinent labs & imaging results that were available during my care of the patient were reviewed by me and considered in my medical decision making (see chart for details).    MDM Rules/Calculators/A&P                         74 year old male with complaints of ongoing weakness and dizziness On presentation, he is alert, oriented, with a flat affect, with no acute neuro deficits.  Lung sounds decreased in the right lower lobe, abdomen is soft and nontender.  Equal strength in lower extremities.  On presentation, he was consistently tachycardic with a pulse of 124 which slightly improved to 112 throughout the ED course.  He was slightly tachypneic.  Remained fairly hypertensive throughout the ED course with a blood pressure 150/65.  He has had recent imaging of the head in September which did not show any acute findings of metastases.  His CBC is without leukocytosis, hemoglobin of 10.1 which appears to be stable.  BMP with mild hyponatremia and mild hypokalemia, clean the setting of vomiting, however no other significant abnormalities.  Normal renal function.  UA with some ketones likely in the setting of some dehydration, but no evidence of UTI.  His Covid test is negative.  Lactate of 1.  His chest x-ray suggestive of right lower lobe pneumonia.  EKG is sinus tach.  Patient  was unable to complete the entire set of orthostatic vital signs, unable to stand for 3 minutes, however there was no significant change from lying to sitting to standing and his blood pressure.  He was given 1 L fluid bolus here in the ED.  Started empirically on Rocephin and azithromycin for CAP.  Given these  findings with the history of lung cancer, consulted hospitalist Dr. Marylyn Ishihara for further admission.  Patient remains hemodynamically stable here in the ED.  Case discussed with Dr. Zenia Resides who is agreeable to the above plan and disposition  Final Clinical Impression(s) / ED Diagnoses Final diagnoses:  Community acquired pneumonia of right lower lobe of lung  Hypokalemia  Hyponatremia    Rx / DC Orders ED Discharge Orders    None       Lyndel Safe 03/04/2020 1441    Lacretia Leigh, MD 03/06/2020 1353

## 2020-02-12 NOTE — ED Triage Notes (Signed)
Pt arrives today with wife. Pt has been having dizziness and weakness. Pt was seen on Saturday for the same, and reports there has not been improvement. Wife also adds that pt has not had an appetite. Pt given walker via cancer center yesterday.  Hx lung CA.

## 2020-02-12 NOTE — H&P (Addendum)
History and Physical    Ivan Daniels KGU:[id number] DOB: [birth year] DOA: 02/27/2020  PCP: [name], FNP  Patient coming from: Home  Chief Complaint: N/V/weakness  HPI: Ivan Daniels is a 74 y.o. male with medical history significant of non-small cell lung CA. Presenting with 2 weeks of nausea, vomiting, and generalized weakness. Hx is per sister at bedside. She notes that he was in his normal state of health until about 2 weeks ago. At that time, she notes that he complained about nausea and was unable to keep heavy food down. This progressed to problems w/ holding down liquids and his medications. She denies he had any complaints of ab pain, CP, or any other symptoms. She noted that he was becoming progressively weak and required more and more help with daily activities. She tried seeing his regular doctors, but was unable to get relief. She decided this morning it was time to come to the ED. She reports no other aggravating or alleviating factors. She reports no other treatments given for these symptoms. Of note, the patient has chemotherapy ongoing for his NSCLC.   ED Course: CXR was positive for RLL PNA. He was noted to be tachycardic and tachypneic. TRH was called for admission.    Review of Systems:  He denies CP, palpitations, syncopal episodes. He reports weakness in legs and tremor. Review of systems is otherwise negative for all not mentioned in HPI.   PMHx Past Medical History:  Diagnosis Date  . ALCOHOL ABUSE 11/12/2009   Qualifier: Diagnosis of  By: Hassell Done FNP, Tori Milks    . Allergy   . Anemia   . Anxiety   . BENIGN PROSTATIC HYPERTROPHY, HX OF 12/19/2006   Qualifier: Diagnosis of  By: Radene Ou MD, [place]    . Blood transfusion without reported diagnosis 2017  . Clotting disorder ([place])    takes PLAVIX  . Depression   . Emphysema of lung ([place])   . Full code status 02/10/2015  . GERD (gastroesophageal reflux disease)   . Hyperlipidemia   . Lung mass  01/25/2015   3.7 x 3.6 cm RUL spiculated lung mass with 2.0 cm right paratracheal lymph node suspicious for bronchogenic CA  . Non-small cell lung cancer ([place]) 01/27/15   non small-cell,squamous cell ca RUL  . Paranoid schizophrenia ([place]) 10/31/2009   Qualifier: Diagnosis of  By: Jorene Minors, [place]    . Primary spontaneous pneumothorax, left 01/25/2015  . TOBACCO ABUSE 05/24/2009   Qualifier: Diagnosis of  By: Hassell Done FNP, Tori Milks      PSHx Past Surgical History:  Procedure Laterality Date  . APPENDECTOMY    . CHEST TUBE INSERTION Left   . COLONOSCOPY    . INSERTION CENTRAL VENOUS ACCESS DEVICE W/ SUBCUTANEOUS PORT Right   . LUNG BIOPSY Right 01/27/2015   Procedure: Right Upper Lobe Bronchus BIOPSY;  Surgeon: Grace Isaac, MD;  Location: [facility];  Service: Thoracic;  Laterality: Right;  Marland Kitchen VIDEO BRONCHOSCOPY WITH ENDOBRONCHIAL ULTRASOUND N/A 01/27/2015   Procedure: VIDEO BRONCHOSCOPY WITH ENDOBRONCHIAL ULTRASOUND;  Surgeon: Grace Isaac, MD;  Location: [facility];  Service: Thoracic;  Laterality: N/A;    SocHx  reports that he quit smoking about 4 years ago. His smoking use included cigarettes. He has a 13.00 pack-year smoking history. He has never used smokeless tobacco. He reports previous alcohol use. He reports that he does not use drugs.  Allergies  Allergen Reactions  . Benztropine Mesylate     REACTION: decrease ROM neck. Pt  states he is not allergic   . Chlorpromazine Hcl     Unknown/ per pt causes him to draw up    FamHx Family History  Problem Relation Age of Onset  . Breast cancer Sister   . Diabetes Sister   . Hyperlipidemia Sister   . Lung disease Neg Hx   . Colon cancer Neg Hx   . Esophageal cancer Neg Hx   . Stomach cancer Neg Hx   . Pancreatic cancer Neg Hx   . Prostate cancer Neg Hx   . Rectal cancer Neg Hx     Prior to Admission medications   Medication Sig Start Date End Date Taking? Authorizing Provider  aspirin EC 81 MG tablet Take 81 mg by mouth 2  (two) times daily.    [provider]  benztropine (COGENTIN) 0.5 MG tablet Take 0.5 mg by mouth 2 (two) times daily.    [provider]  chlorhexidine (PERIDEX) 0.12 % solution 1 mL by Mouth Rinse route as needed.  06/15/15   [provider]  clopidogrel (PLAVIX) 75 MG tablet Take 75 mg by mouth daily.  12/09/12   [provider]  ferrous fumarate (HEMOCYTE - 106 MG FE) 325 (106 FE) MG TABS tablet Take 1 tablet by mouth daily.     [provider]  haloperidol (HALDOL) 5 MG tablet Take 5 mg by mouth at bedtime.  02/21/13   [provider]  Multiple Vitamins-Minerals (MULTIVITAMIN WITH MINERALS) tablet Take 1 tablet by mouth daily.    [provider]  NONFORMULARY OR COMPOUNDED [place]  Combination Pain Cream -  Baclofen 2%, Doxepin 5%, Gabapentin 6%, Topiramate 2%, Pentoxifylline 3% Apply 1-2 grams to affected area 3-4 times daily Qty. 120 gm 3 refills 07/03/17   Gardiner Barefoot, DPM  ondansetron (ZOFRAN) 8 MG tablet Take 1 tablet (8 mg total) by mouth every 8 (eight) hours as needed for nausea or vomiting. 01/08/19   Curt Bears, MD  simvastatin (ZOCOR) 20 MG tablet Take 20 mg by mouth at bedtime.  02/14/13   [provider]  umeclidinium-vilanterol (ANORO ELLIPTA) 62.5-25 MCG/INH AEPB Inhale 1 puff into the lungs daily. 04/11/19   Tanda Rockers, MD  VYZULTA 0.024 % SOLN Place 1 drop into both eyes daily.  09/19/19   [provider]    Physical Exam: Vitals:   03/02/2020 1100 03/04/2020 1130 02/28/2020 1207 02/11/2020 1259  BP: 128/68 126/61 127/64 101/80  Pulse: (!) 121 (!) 118 (!) 116 (!) 116  Resp: (!) 31 (!) 30 (!) 25 (!) 34  Temp:      TempSrc:      SpO2: 97% 100% 93% 100%    General: 74 y.o. male resting in bed in NAD Eyes: PERRL, normal sclera ENMT: Nares patent w/o discharge, orophaynx clear, dentition normal, ears w/o discharge/lesions/ulcers Neck: Supple, trachea midline Cardiovascular:  tachy, +S1, S2, no m/g/r, equal pulses throughout; right chest port noted Respiratory: decreased at right base, no w/r/r, normal WOB on RA GI: BS+, NDNT, no masses noted, no organomegaly noted MSK: No e/c/c Skin: No rashes, bruises, ulcerations noted Neuro: A&O x 2 (person, place), no focal deficits Psyc: Appropriate interaction but flat affect, calm/cooperative  Labs on Admission: I have personally reviewed following labs and imaging studies  CBC: Recent Labs  Lab 02/07/20 1009 02/07/20 1146 02/11/20 1425 02/20/2020 1137  WBC 4.8  --  4.0 4.9  NEUTROABS 2.5  --  2.1  --   HGB 10.7* 10.9* 10.2*  10.1*  HCT 32.3* 32.0* 31.1* 31.6*  MCV 86.4  --  84.7 88.3  PLT 311  --  286 010   Basic Metabolic Panel: Recent Labs  Lab 02/07/20 1009 02/07/20 1146 02/11/20 1425 02/14/2020 1137  NA 132* 134* 133* 132*  K 3.6 3.6 3.4* 3.4*  CL 97* 94* 97* 99  CO2 26  --  27 22  GLUCOSE 96 93 90 97  BUN 8 5* 9 9  CREATININE 0.89 0.80 0.83 0.92  CALCIUM 8.4*  --  9.4 8.3*   GFR: Estimated Creatinine Clearance: 68.5 mL/min (by C-G formula based on SCr of 0.92 mg/dL). Liver Function Tests: Recent Labs  Lab 02/07/20 1009 02/11/20 1425  AST 46* 43*  ALT 31 27  ALKPHOS 35* 41  BILITOT 0.9 0.6  PROT 6.7 6.9  ALBUMIN 3.1* 2.8*   No results for input(s): LIPASE, AMYLASE in the last 168 hours. No results for input(s): AMMONIA in the last 168 hours. Coagulation Profile: No results for input(s): INR, PROTIME in the last 168 hours. Cardiac Enzymes: No results for input(s): CKTOTAL, CKMB, CKMBINDEX, TROPONINI in the last 168 hours. BNP (last 3 results) No results for input(s): PROBNP in the last 8760 hours. HbA1C: No results for input(s): HGBA1C in the last 72 hours. CBG: Recent Labs  Lab 02/13/2020 1051  GLUCAP 82   Lipid Profile: No results for input(s): CHOL, HDL, LDLCALC, TRIG, CHOLHDL, LDLDIRECT in the last 72 hours. Thyroid Function Tests: Recent Labs    02/11/20 1425  TSH  <0.080*   Anemia Panel: No results for input(s): VITAMINB12, FOLATE, FERRITIN, TIBC, IRON, RETICCTPCT in the last 72 hours. Urine analysis:    Component Value Date/Time   COLORURINE YELLOW 03/04/2020 [place] 02/24/2020 1317   LABSPEC 1.019 02/09/2020 1317   PHURINE 5.0 02/28/2020 1317   [place] 03/04/2020 1317   HGBUR NEGATIVE 02/23/2020 1317   BILIRUBINUR NEGATIVE 02/18/2020 1317   KETONESUR 80 (A) 02/10/2020 1317   PROTEINUR NEGATIVE 02/07/2020 1317   NITRITE NEGATIVE 02/13/2020 1317   LEUKOCYTESUR NEGATIVE 02/11/2020 1317    Radiological Exams on Admission: DG Chest Portable 1 View  Result Date: 02/09/2020 CLINICAL DATA:  Tachycardia.  Lung cancer EXAM: PORTABLE CHEST 1 VIEW COMPARISON:  02/07/2020, 01/26/2020 FINDINGS: Right-sided Port-A-Cath remains in place. Normal heart size. Irregular masslike density within the right upper lobe, grossly unchanged compared to priors. Interval development of hazy opacity within the right lung base and small right pleural effusion. No pneumothorax. IMPRESSION: 1. Interval development of hazy opacity within the right lung base and small right pleural effusion. Findings may represent atelectasis, aspiration and/or pneumonia. 2. Grossly stable appearance of right upper lobe mass. Electronically Signed   By: Davina Poke D.O.   On: 02/25/2020 11:43    EKG: Independently reviewed. Sinus tach  Assessment/Plan RLL PNA (CAP)     - admit to inpatient, med-surg     - continue azithro, rocephin (received doses in ED)     - O2 as needed     - IS, PRN anti-tussives, PRN breathing tx     - blood cultures pending  Intractable N/V Anorexia     - CLD     - check KUB     - anti-emetics     - D5NS at 100cc/hr  Generalized weakness     - PT/OT  Recurrent NSCLC S3a     - receiving chemo w/ oncology (Dr. Earlie Server); will notify of patient arrival  Hypokalemia Hyponatremia (mild)     -  check Mg2+     - replace K+,  follow     - IVF  Normocytic anemia Hx of iron deficiency anemia     - no evidence of bleed     - at baseline, follow  Hx of schizophrenia     - continue home meds (haloperidol)  DVT prophylaxis: lovenox  Code Status: DNR (confirmed w/ sister at bedside)  Family Communication: With sister at bedside  Consults called: None  Admission status: Inpatient d/t inability to take PO.   Status is: Inpatient  Remains inpatient appropriate because:IV treatments appropriate due to intensity of illness or inability to take PO   Dispo: The patient is from: Home              Anticipated d/c is to: Home              Anticipated d/c date is: 2 days              Patient currently is not medically stable to d/c.  Jonnie Finner DO [facility]  If 7PM-7AM, please contact night-coverage [email or web address]  02/14/2020, 1:38 PM

## 2020-02-12 NOTE — ED Notes (Signed)
Pt able to stand at bedside with assistance. Pt unsteady to stand for 3 minutes to obtain last orthostatic. Pt returned to bed with assistance. Pt placed on primofit at 60 mmHg.

## 2020-02-13 DIAGNOSIS — E871 Hypo-osmolality and hyponatremia: Secondary | ICD-10-CM

## 2020-02-13 DIAGNOSIS — R112 Nausea with vomiting, unspecified: Secondary | ICD-10-CM | POA: Diagnosis not present

## 2020-02-13 DIAGNOSIS — E876 Hypokalemia: Secondary | ICD-10-CM | POA: Diagnosis not present

## 2020-02-13 LAB — COMPREHENSIVE METABOLIC PANEL
ALT: 22 U/L (ref 0–44)
AST: 33 U/L (ref 15–41)
Albumin: 2.6 g/dL — ABNORMAL LOW (ref 3.5–5.0)
Alkaline Phosphatase: 30 U/L — ABNORMAL LOW (ref 38–126)
Anion gap: 9 (ref 5–15)
BUN: 7 mg/dL — ABNORMAL LOW (ref 8–23)
CO2: 21 mmol/L — ABNORMAL LOW (ref 22–32)
Calcium: 7.9 mg/dL — ABNORMAL LOW (ref 8.9–10.3)
Chloride: 104 mmol/L (ref 98–111)
Creatinine, Ser: 0.78 mg/dL (ref 0.61–1.24)
GFR calc non Af Amer: >60 mL/min (ref >60)
Glucose, Bld: 112 mg/dL — ABNORMAL HIGH (ref 70–99)
Potassium: 3.6 mmol/L (ref 3.5–5.1)
Sodium: 134 mmol/L — ABNORMAL LOW (ref 135–145)
Total Bilirubin: 1 mg/dL (ref 0.3–1.2)
Total Protein: 5.8 g/dL — ABNORMAL LOW (ref 6.5–8.1)

## 2020-02-13 LAB — CBC
HCT: 28.6 % — ABNORMAL LOW (ref 39.0–52.0)
Hemoglobin: 9.3 g/dL — ABNORMAL LOW (ref 13.0–17.0)
MCH: 28.5 pg (ref 26.0–34.0)
MCHC: 32.5 g/dL (ref 30.0–36.0)
MCV: 87.7 fL (ref 80.0–100.0)
Platelets: 252 10*3/uL (ref 150–400)
RBC: 3.26 MIL/uL — ABNORMAL LOW (ref 4.22–5.81)
RDW: 15.9 % — ABNORMAL HIGH (ref 11.5–15.5)
WBC: 3.2 10*3/uL — ABNORMAL LOW (ref 4.0–10.5)
nRBC: 0 % (ref 0.0–0.2)

## 2020-02-13 MED ORDER — ORAL CARE MOUTH RINSE
15.0000 mL | Freq: Two times a day (BID) | OROMUCOSAL | Status: DC
  Administered 2020-02-15 – 2020-02-17 (×3): 15 mL via OROMUCOSAL

## 2020-02-13 MED ORDER — PROSOURCE PLUS PO LIQD
30.0000 mL | Freq: Two times a day (BID) | ORAL | Status: DC
  Administered 2020-02-13: 30 mL via ORAL
  Filled 2020-02-13: qty 30

## 2020-02-13 MED ORDER — LATANOPROST 0.005 % OP SOLN
1.0000 [drp] | Freq: Every day | OPHTHALMIC | Status: DC
  Administered 2020-02-13 – 2020-02-19 (×7): 1 [drp] via OPHTHALMIC
  Filled 2020-02-13 (×2): qty 2.5

## 2020-02-13 MED ORDER — HYDROXYZINE HCL 10 MG PO TABS
10.0000 mg | ORAL_TABLET | Freq: Three times a day (TID) | ORAL | Status: DC | PRN
  Filled 2020-02-13: qty 1

## 2020-02-13 MED ORDER — CHLORHEXIDINE GLUCONATE 0.12 % MT SOLN
15.0000 mL | Freq: Two times a day (BID) | OROMUCOSAL | Status: DC
  Administered 2020-02-13 – 2020-02-17 (×6): 15 mL via OROMUCOSAL
  Filled 2020-02-13 (×7): qty 15

## 2020-02-13 MED ORDER — ADULT MULTIVITAMIN LIQUID CH
15.0000 mL | Freq: Every day | ORAL | Status: DC
  Filled 2020-02-13 (×2): qty 15

## 2020-02-13 MED ORDER — ADULT MULTIVITAMIN W/MINERALS CH
1.0000 | ORAL_TABLET | Freq: Every day | ORAL | Status: DC
  Filled 2020-02-13: qty 1

## 2020-02-13 MED ORDER — BOOST / RESOURCE BREEZE PO LIQD CUSTOM
1.0000 | Freq: Three times a day (TID) | ORAL | Status: DC
  Administered 2020-02-13 (×2): 1 via ORAL

## 2020-02-13 NOTE — Progress Notes (Signed)
PROGRESS NOTE    Ivan Daniels  ZOX:[id number] DOB: 01-Oct-1945 DOA: 02/27/2020 PCP: [name], FNP    Brief Narrative:   74 y.o. male with medical history significant of non-small cell lung CA. Presenting with 2 weeks of nausea, vomiting, and generalized weakness. Hx is per sister at bedside. She notes that he was in his normal state of health until about 2 weeks ago. At that time, she notes that he complained about nausea and was unable to keep heavy food down. This progressed to problems w/ holding down liquids and his medications. She denies he had any complaints of ab pain, CP, or any other symptoms. She noted that he was becoming progressively weak and required more and more help with daily activities. She tried seeing his regular doctors, but was unable to get relief. She decided this morning it was time to come to the ED. She reports no other aggravating or alleviating factors. She reports no other treatments given for these symptoms. Of note, the patient has chemotherapy ongoing for his NSCLC.   ED Course: CXR was positive for RLL PNA. He was noted to be tachycardic and tachypneic. TRH was called for admission.    Assessment & Plan:   Active Problems:   Intractable nausea and vomiting  RLL PNA (CAP) - Pt is continued on azithro, rocephin (received doses in ED)  - Currently on [facility]. Pt with tachypnea with RR in the 30-40's however pt is not in distress -continue  IS, PRN anti-tussives, PRN breathing tx as needed -Will request SLP eval to r/o aspiration  Intractable N/V -Seems stable this AM without recurrence thus far -abd xray personally reviewed by myself. Findings of non-obstructive bowel gas noted -Question if related to current chemo for lung cancer. Most recent infusion was 9/16  Anorexia -abd xray per above -continue with anti-emetics as needed - Continue with D5NS at 100cc/hr  Generalized weakness     - PT/OT consulted - Therapy recs for SNF  noted  Recurrent NSCLC S3a     - receiving chemo w/ oncology (Dr. Earlie Server) -See above, pt underwent last chemo on 9/16  Hypokalemia Hyponatremia (mild) -Improved with fluids -Potassium level improved -Repeat bmet in AM  Normocytic anemia Hx of iron deficiency anemia - no evidence of bleed - Hgb seems to be near baseline at this time -Repeat cbc in AM  Hx of schizophrenia - continue home meds (haloperidol)  DVT prophylaxis: Lovenox subq Code Status: DNR Family Communication: Pt in room, family is at bedside  Status is: Inpatient  Remains inpatient appropriate because:Unsafe d/c plan, IV treatments appropriate due to intensity of illness or inability to take PO and Inpatient level of care appropriate due to severity of illness   Dispo: The patient is from: Home              Anticipated d/c is to: SNF              Anticipated d/c date is: > 3 days              Patient currently is not medically stable to d/c.   Consultants:     Procedures:     Antimicrobials: Anti-infectives (From admission, onward)   Start     Dose/Rate Route Frequency Ordered Stop   02/13/20 1000  cefTRIAXone (ROCEPHIN) 2 g in sodium chloride 0.9 % 100 mL IVPB        2 g 200 mL/hr over 30 Minutes Intravenous Every 24 hours 02/25/2020 2050  02/17/20 0959   02/13/20 1000  azithromycin (ZITHROMAX) 500 mg in sodium chloride 0.9 % 250 mL IVPB        500 mg 250 mL/hr over 60 Minutes Intravenous Every 24 hours 02/27/2020 2050 02/17/20 0959   02/17/2020 1230  cefTRIAXone (ROCEPHIN) 1 g in sodium chloride 0.9 % 100 mL IVPB        1 g 200 mL/hr over 30 Minutes Intravenous  Once 02/11/2020 1222 02/22/2020 1535   02/10/2020 1230  azithromycin (ZITHROMAX) 500 mg in sodium chloride 0.9 % 250 mL IVPB        500 mg 250 mL/hr over 60 Minutes Intravenous  Once 02/18/2020 1222 03/07/2020 1851       Subjective: Denies chest pain or abd pain. Denies feeling sob  Objective: Vitals:   02/13/20 0808 02/13/20 0859  02/13/20 1012 02/13/20 1429  BP:  134/66 130/69 106/61  Pulse:  (!) 108 (!) 109 (!) 113  Resp:  (!) 36 (!) 30 (!) 40  Temp:  98.7 F (37.1 C) 98 F (36.7 C) 98.9 F (37.2 C)  TempSrc:  Oral Oral Oral  SpO2: 99% 95% 100%   Weight:      Height:        Intake/Output Summary (Last 24 hours) at 02/13/2020 1444 Last data filed at 02/13/2020 1000 Gross per 24 hour  Intake 1450 ml  Output 500 ml  Net 950 ml   Filed Weights   02/10/2020 2050  Weight: 66.7 kg    Examination:  General exam: Appears calm and comfortable  Respiratory system: Clear to auscultation. Respiratory effort normal. Cardiovascular system: S1 & S2 heard, Regular Gastrointestinal system: Abdomen is nondistended, soft and nontender. No organomegaly or masses felt. Normal bowel sounds heard. Central nervous system: Alert and oriented. No focal neurological deficits. Extremities: Symmetric 5 x 5 power. Skin: No rashes, lesions  Psychiatry: Judgement and insight appear normal. Mood & affect appropriate.   Data Reviewed: I have personally reviewed following labs and imaging studies  CBC: Recent Labs  Lab 02/07/20 1009 02/07/20 1009 02/07/20 1146 02/11/20 1425 02/27/2020 1137 02/11/2020 2130 02/13/20 0333  WBC 4.8  --   --  4.0 4.9 4.7 3.2*  NEUTROABS 2.5  --   --  2.1  --   --   --   HGB 10.7*   < > 10.9* 10.2* 10.1* 9.8* 9.3*  HCT 32.3*   < > 32.0* 31.1* 31.6* 30.0* 28.6*  MCV 86.4  --   --  84.7 88.3 87.7 87.7  PLT 311  --   --  286 312 267 252   < > = values in this interval not displayed.   Basic Metabolic Panel: Recent Labs  Lab 02/07/20 1009 02/07/20 1009 02/07/20 1146 02/11/20 1425 02/27/2020 1137 03/05/2020 2130 02/13/20 0333  NA 132*  --  134* 133* 132*  --  134*  K 3.6  --  3.6 3.4* 3.4*  --  3.6  CL 97*  --  94* 97* 99  --  104  CO2 26  --   --  27 22  --  21*  GLUCOSE 96  --  93 90 97  --  112*  BUN 8  --  5* 9 9  --  7*  CREATININE 0.89   < > 0.80 0.83 0.92 0.85 0.78  CALCIUM 8.4*  --    --  9.4 8.3*  --  7.9*  MG  --   --   --   --   --  1.9  --    < > = values in this interval not displayed.   GFR: Estimated Creatinine Clearance: 78.7 mL/min (by C-G formula based on SCr of 0.78 mg/dL). Liver Function Tests: Recent Labs  Lab 02/07/20 1009 02/11/20 1425 02/13/20 0333  AST 46* 43* 33  ALT 31 27 22   ALKPHOS 35* 41 30*  BILITOT 0.9 0.6 1.0  PROT 6.7 6.9 5.8*  ALBUMIN 3.1* 2.8* 2.6*   No results for input(s): LIPASE, AMYLASE in the last 168 hours. No results for input(s): AMMONIA in the last 168 hours. Coagulation Profile: No results for input(s): INR, PROTIME in the last 168 hours. Cardiac Enzymes: No results for input(s): CKTOTAL, CKMB, CKMBINDEX, TROPONINI in the last 168 hours. BNP (last 3 results) No results for input(s): PROBNP in the last 8760 hours. HbA1C: No results for input(s): HGBA1C in the last 72 hours. CBG: Recent Labs  Lab 02/22/2020 1051  GLUCAP 82   Lipid Profile: No results for input(s): CHOL, HDL, LDLCALC, TRIG, CHOLHDL, LDLDIRECT in the last 72 hours. Thyroid Function Tests: Recent Labs    02/11/20 1425  TSH <0.080*   Anemia Panel: No results for input(s): VITAMINB12, FOLATE, FERRITIN, TIBC, IRON, RETICCTPCT in the last 72 hours. Sepsis Labs: Recent Labs  Lab 02/07/20 1009 03/03/2020 1240  LATICACIDVEN 1.0 1.0    Recent Results (from the past 240 hour(s))  Respiratory Panel by RT PCR (Flu A&B, Covid) - Nasopharyngeal Swab     Status: None   Collection Time: 02/07/20 10:10 AM   Specimen: Nasopharyngeal Swab  Result Value Ref Range Status   SARS Coronavirus 2 by RT PCR NEGATIVE NEGATIVE Final    Comment: (NOTE) SARS-CoV-2 target nucleic acids are NOT DETECTED.  The SARS-CoV-2 RNA is generally detectable in upper respiratoy specimens during the acute phase of infection. The lowest concentration of SARS-CoV-2 viral copies this assay can detect is 131 copies/mL. A negative result does not preclude SARS-Cov-2 infection and  should not be used as the sole basis for treatment or other patient management decisions. A negative result may occur with  improper specimen collection/handling, submission of specimen other than nasopharyngeal swab, presence of viral mutation(s) within the areas targeted by this assay, and inadequate number of viral copies (<131 copies/mL). A negative result must be combined with clinical observations, patient history, and epidemiological information. The expected result is Negative.  Fact Sheet for Patients:  PinkCheek.be  Fact Sheet for Healthcare Providers:  GravelBags.it  This test is no t yet approved or cleared by the [place] FDA and  has been authorized for detection and/or diagnosis of SARS-CoV-2 by FDA under an Emergency Use Authorization (EUA). This EUA will remain  in effect (meaning this test can be used) for the duration of the COVID-19 declaration under Section 564(b)(1) of the Act, 21 U.S.C. section 360bbb-3(b)(1), unless the authorization is terminated or revoked sooner.     Influenza A by PCR NEGATIVE NEGATIVE Final   Influenza B by PCR NEGATIVE NEGATIVE Final    Comment: (NOTE) The Xpert Xpress SARS-CoV-2/FLU/RSV assay is intended as an aid in  the diagnosis of influenza from Nasopharyngeal swab specimens and  should not be used as a sole basis for treatment. Nasal washings and  aspirates are unacceptable for Xpert Xpress SARS-CoV-2/FLU/RSV  testing.  Fact Sheet for Patients: PinkCheek.be  Fact Sheet for Healthcare Providers: GravelBags.it  This test is not yet approved or cleared by the [place] FDA and  has been authorized for detection and/or diagnosis of  SARS-CoV-2 by  FDA under an Emergency Use Authorization (EUA). This EUA will remain  in effect (meaning this test can be used) for the duration of the  Covid-19 declaration  under Section 564(b)(1) of the Act, 21  U.S.C. section 360bbb-3(b)(1), unless the authorization is  terminated or revoked. Performed at [facility], [place] [address], [place], [place] [address]   Blood culture (routine x 2)     Status: None (Preliminary result)   Collection Time: 02/28/2020 12:40 PM   Specimen: BLOOD  Result Value Ref Range Status   Specimen Description   Final    BLOOD LEFT ANTECUBITAL Performed at [facility] [address], [place], [place] [address]    Special Requests   Final    BOTTLES DRAWN AEROBIC AND ANAEROBIC Blood Culture adequate volume Performed at [facility] [address] [place], [place] [address]    Culture   Final    NO GROWTH < 24 HOURS Performed at [facility] [address], [place], [place] [address]    Report Status PENDING  Incomplete  Blood culture (routine x 2)     Status: None (Preliminary result)   Collection Time: 02/27/2020 12:50 PM   Specimen: BLOOD  Result Value Ref Range Status   Specimen Description   Final    BLOOD RIGHT ANTECUBITAL Performed at [facility] [address], [place], [place] [address]    Special Requests   Final    BOTTLES DRAWN AEROBIC AND ANAEROBIC Blood Culture adequate volume   Culture   Final    NO GROWTH < 24 HOURS Performed at [facility], [place] [address] [place],  [address]    Report Status PENDING  Incomplete  Respiratory Panel by RT PCR (Flu A&B, Covid) - Nasopharyngeal Swab     Status: None   Collection Time: 02/08/2020 12:55 PM   Specimen: Nasopharyngeal Swab  Result Value Ref Range Status   SARS Coronavirus 2 by RT PCR NEGATIVE NEGATIVE Final    Comment: (NOTE) SARS-CoV-2 target nucleic acids are NOT DETECTED.  The SARS-CoV-2 RNA is generally detectable in upper respiratoy specimens during the acute phase of infection. The lowest concentration of SARS-CoV-2 viral copies this assay can detect  is 131 copies/mL. A negative result does not preclude SARS-Cov-2 infection and should not be used as the sole basis for treatment or other patient management decisions. A negative result may occur with  improper specimen collection/handling, submission of specimen other than nasopharyngeal swab, presence of viral mutation(s) within the areas targeted by this assay, and inadequate number of viral copies (<131 copies/mL). A negative result must be combined with clinical observations, patient history, and epidemiological information. The expected result is Negative.  Fact Sheet for Patients:  PinkCheek.be  Fact Sheet for Healthcare Providers:  GravelBags.it  This test is no t yet approved or cleared by the [place] FDA and  has been authorized for detection and/or diagnosis of SARS-CoV-2 by FDA under an Emergency Use Authorization (EUA). This EUA will remain  in effect (meaning this test can be used) for the duration of the COVID-19 declaration under Section 564(b)(1) of the Act, 21 U.S.C. section 360bbb-3(b)(1), unless the authorization is terminated or revoked sooner.     Influenza A by PCR NEGATIVE NEGATIVE Final   Influenza B by PCR NEGATIVE NEGATIVE Final    Comment: (NOTE) The Xpert Xpress SARS-CoV-2/FLU/RSV assay is intended as an aid in  the diagnosis of  influenza from Nasopharyngeal swab specimens and  should not be used as a sole basis for treatment. Nasal washings and  aspirates are unacceptable for Xpert Xpress SARS-CoV-2/FLU/RSV  testing.  Fact Sheet for Patients: PinkCheek.be  Fact Sheet for Healthcare Providers: GravelBags.it  This test is not yet approved or cleared by the [place] FDA and  has been authorized for detection and/or diagnosis of SARS-CoV-2 by  FDA under an Emergency Use Authorization (EUA). This EUA will remain  in effect  (meaning this test can be used) for the duration of the  Covid-19 declaration under Section 564(b)(1) of the Act, 21  U.S.C. section 360bbb-3(b)(1), unless the authorization is  terminated or revoked. Performed at [facility], [place] [address] [place], [place] [address]      Radiology Studies: DG Abd 1 View  Result Date: 02/23/2020 CLINICAL DATA:  Decreased appetite EXAM: ABDOMEN - 1 VIEW COMPARISON:  February 07, 2020 FINDINGS: Relatively mild volume of stool. Postoperative change in lower abdomen. No bowel dilatation or air-fluid level to suggest bowel obstruction. No free air. Vascular calcifications noted in the pelvis. Visualized lung bases clear. IMPRESSION: No bowel obstruction or free air on supine examination. Postoperative changes noted. Electronically Signed   By: Lowella Grip III M.D.   On: 02/11/2020 14:24   DG Chest Portable 1 View  Result Date: 03/03/2020 CLINICAL DATA:  Tachycardia.  Lung cancer EXAM: PORTABLE CHEST 1 VIEW COMPARISON:  02/07/2020, 01/26/2020 FINDINGS: Right-sided Port-A-Cath remains in place. Normal heart size. Irregular masslike density within the right upper lobe, grossly unchanged compared to priors. Interval development of hazy opacity within the right lung base and small right pleural effusion. No pneumothorax. IMPRESSION: 1. Interval development of hazy opacity within the right lung base and small right pleural effusion. Findings may represent atelectasis, aspiration and/or pneumonia. 2. Grossly stable appearance of right upper lobe mass. Electronically Signed   By: Davina Poke D.O.   On: 02/25/2020 11:43    Scheduled Meds: . (feeding supplement) PROSource Plus  30 mL Oral BID BM  . aspirin EC  81 mg Oral BID  . benztropine  0.5 mg Oral Daily  . Chlorhexidine Gluconate Cloth  6 each Topical Daily  . clopidogrel  75 mg Oral Daily  . enoxaparin (LOVENOX) injection  40 mg Subcutaneous Q24H  . feeding supplement  1 Container Oral  TID BM  . guaiFENesin  600 mg Oral BID  . haloperidol  5 mg Oral QHS  . latanoprost  1 drop Both Eyes QHS  . multivitamin with minerals  1 tablet Oral Daily  . simvastatin  20 mg Oral QHS  . umeclidinium-vilanterol  1 puff Inhalation Daily   Continuous Infusions: . azithromycin 500 mg (02/13/20 1149)  . cefTRIAXone (ROCEPHIN)  IV 2 g (02/13/20 1032)  . dextrose 5 % and 0.9% NaCl 100 mL/hr at 02/13/20 0755     LOS: 1 day   Marylu Lund, MD [facility] Pager On Amion  If 7PM-7AM, please contact night-coverage 02/13/2020, 2:44 PM

## 2020-02-13 NOTE — Evaluation (Signed)
Clinical/Bedside Swallow Evaluation Patient Details  Name: Ivan Daniels MRN: [id number] Date of Birth: 1945-07-25  Today's Date: 02/13/2020 Time: SLP Start Time (ACUTE ONLY): 1609 SLP Stop Time (ACUTE ONLY): 1632 SLP Time Calculation (min) (ACUTE ONLY): 23 min  Past Medical History:  Past Medical History:  Diagnosis Date  . ALCOHOL ABUSE 11/12/2009   Qualifier: Diagnosis of  By: Hassell Done FNP, Tori Milks    . Allergy   . Anemia   . Anxiety   . BENIGN PROSTATIC HYPERTROPHY, HX OF 12/19/2006   Qualifier: Diagnosis of  By: Radene Ou MD, [place]    . Blood transfusion without reported diagnosis 2017  . Clotting disorder ([place])    takes PLAVIX  . Depression   . Emphysema of lung ([place])   . Full code status 02/10/2015  . GERD (gastroesophageal reflux disease)   . Hyperlipidemia   . Lung mass 01/25/2015   3.7 x 3.6 cm RUL spiculated lung mass with 2.0 cm right paratracheal lymph node suspicious for bronchogenic CA  . Non-small cell lung cancer ([place]) 01/27/15   non small-cell,squamous cell ca RUL  . Paranoid schizophrenia ([place]) 10/31/2009   Qualifier: Diagnosis of  By: Jorene Minors, [place]    . Primary spontaneous pneumothorax, left 01/25/2015  . TOBACCO ABUSE 05/24/2009   Qualifier: Diagnosis of  By: Hassell Done FNP, Tori Milks     Past Surgical History:  Past Surgical History:  Procedure Laterality Date  . APPENDECTOMY    . CHEST TUBE INSERTION Left   . COLONOSCOPY    . INSERTION CENTRAL VENOUS ACCESS DEVICE W/ SUBCUTANEOUS PORT Right   . LUNG BIOPSY Right 01/27/2015   Procedure: Right Upper Lobe Bronchus BIOPSY;  Surgeon: Grace Isaac, MD;  Location: [facility];  Service: Thoracic;  Laterality: Right;  Marland Kitchen VIDEO BRONCHOSCOPY WITH ENDOBRONCHIAL ULTRASOUND N/A 01/27/2015   Procedure: VIDEO BRONCHOSCOPY WITH ENDOBRONCHIAL ULTRASOUND;  Surgeon: Grace Isaac, MD;  Location: [facility];  Service: Thoracic;  Laterality: N/A;   HPI:  Patient is a 74 year old male with medical history significant of  non-small cell lung CA, GERD, esophageal dilation 2018, ETOH abuse, paranoid schizophrenia. presenting with 2 weeks of nausea, vomiting, and generalized weakness. CXR Interval development of hazy opacity within the right lung base and small right pleural effusion. Findings may represent atelectasis, aspiration and/or pneumonia, grossly stable appearance of right upper lobe mass.   Assessment / Plan / Recommendation Clinical Impression  Swallow assessment completed with sister at bedside. Suspect pt's symptoms including coughing with meals, coughing during the night may be from an esophageal source. Pt consumed limited volume and needed encouragement to consume any po's. He did not cough however eructation present and appeared to have mild discomfort with swallowing. Pt's sister stated morning of admit he took pills in applesauce and then began to cough/gag and vomit. His esophagus was dilated in 2018 and may benefit from GI consult to reassess need for intervention. Educated pt and sister re: reflux precautions (sister aware and encourages at home). Recommend regular texture diet (using caution with meat/bread), thin liquids, pills with puree, remain upright after meals, alternate liquids and solids. ST will follow briefly for tolerance and education.     SLP Visit Diagnosis: Dysphagia, unspecified (R13.10)    Aspiration Risk  Mild aspiration risk    Diet Recommendation Regular;Thin liquid   Liquid Administration via: Cup;Straw Medication Administration: Whole meds with puree Supervision: Patient able to self feed;Full supervision/cueing for compensatory strategies Compensations: Minimize environmental distractions;Slow rate;Small sips/bites Postural Changes: Seated upright  at 90 degrees;Remain upright for at least 30 minutes after po intake    Other  Recommendations Recommended Consults: Consider GI evaluation Oral Care Recommendations: Oral care BID   Follow up Recommendations None       Frequency and Duration min 2x/week  2 weeks       Prognosis Prognosis for Safe Diet Advancement: Good      Swallow Study   General HPI: Patient is a 74 year old male with medical history significant of non-small cell lung CA, GERD, esophageal dilation 2018, ETOH abuse, paranoid schizophrenia. presenting with 2 weeks of nausea, vomiting, and generalized weakness. CXR Interval development of hazy opacity within the right lung base and small right pleural effusion. Findings may represent atelectasis, aspiration and/or pneumonia, grossly stable appearance of right upper lobe mass. Type of Study: Bedside Swallow Evaluation Previous Swallow Assessment:  (none) Diet Prior to this Study: NPO Temperature Spikes Noted: No Respiratory Status: Nasal cannula History of Recent Intubation: No Behavior/Cognition: Alert;Cooperative;Requires cueing Oral Cavity Assessment: Within Functional Limits Oral Care Completed by SLP: No Oral Cavity - Dentition: Dentures, bottom;Dentures, top Vision:  (left eye impairment) Self-Feeding Abilities: Able to feed self Patient Positioning: Upright in chair Baseline Vocal Quality: Normal Volitional Cough: Strong Volitional Swallow: Able to elicit    Oral/Motor/Sensory Function Overall Oral Motor/Sensory Function: Within functional limits   Ice Chips Ice chips: Not tested   Thin Liquid Thin Liquid: Within functional limits Presentation: Cup;Straw    Nectar Thick Nectar Thick Liquid: Not tested   Honey Thick Honey Thick Liquid: Not tested   Puree Puree: Within functional limits   Solid     Solid: Within functional limits      Mick Sell, Orbie Pyo 02/13/2020,4:57 PM   Orbie Pyo [place].Ed Risk analyst [phone] Office [phone]

## 2020-02-13 NOTE — Progress Notes (Signed)
Initial Nutrition Assessment  DOCUMENTATION CODES:   Not applicable  INTERVENTION:  - will order Boost Breeze TID, each supplement provides 250 kcal and 9 grams of protein. - will order 30 ml Prosource Plus BID, each supplement provides 100 kcal and 15 grams protein.  - will order 1 tablet multivitamin with minerals/day. - diet advancement as medically feasible.   NUTRITION DIAGNOSIS:   Increased nutrient needs related to catabolic illness, cancer and cancer related treatments as evidenced by estimated needs.  GOAL:   Patient will meet greater than or equal to 90% of their needs  MONITOR:   PO intake, Supplement acceptance, Diet advancement, Labs, Weight trends  REASON FOR ASSESSMENT:   Malnutrition Screening Tool  ASSESSMENT:   74 y.o. male with medical history of NSCLC on chemo, HLD, alcohol and tobacco abuse, paranoid schizophrenia, BPH, emphysema, GERD, anemia, anxiety, depression, and clotting disorder. He presented to the ED for 2 week hx of N/V with inability to keep food down and generalized weakness.  Patient consuming a popsicle during RD visit. His sister was at bedside and provided nearly all information.   Patient lives with her and she aids with all meals/providing items for him to eat. Over the past 1-2 weeks he was having difficulty keeping anything down so she was giving him applesauce with meds and otherwise providing liquids like beef or chicken broth or jello.   She has tried to give him both Boost and Ensure in the past but patient does not like either of these. Suggested trying either Premier Protein or/and Muscle Milk.   Patient does have difficulty swallowing items unless they are soft and thoroughly chewed. He sometimes coughs with consuming liquids but nothing in particular that causes this.   Sister reports patient's UBW is 150-160 lb and that he last weighed this maybe 2 weeks ago. Weight yesterday was 147 lb. Weight on 9/20 was 150 lb. This  indicates 3 lb weight loss (2% body weight) in the past 1.5 weeks; not significant for time frame.    Labs reviewed; Na: 134 mmol/l, BUN: 7 mg/dl, Ca: 7.9 mg/dl, Alk Phos low.  Medications reviewed; 10 mEq IV KCl x4 runs 10/7. IVF; D5-NS @ 100 ml/hr (408 kcal).    NUTRITION - FOCUSED PHYSICAL EXAM:    Most Recent Value  Orbital Region No depletion  Upper Arm Region No depletion  Thoracic and Lumbar Region Unable to assess  Buccal Region No depletion  Temple Region No depletion  Clavicle Bone Region Mild depletion  Clavicle and Acromion Bone Region Mild depletion  Scapular Bone Region Unable to assess  Dorsal Hand No depletion  Patellar Region Mild depletion  Anterior Thigh Region Unable to assess  Posterior Calf Region Mild depletion  Edema (RD Assessment) None  Hair Reviewed  Eyes Reviewed  Mouth Reviewed  Skin Reviewed  Nails Reviewed       Diet Order:   Diet Order            Diet clear liquid Room service appropriate? Yes; Fluid consistency: Thin  Diet effective now                 EDUCATION NEEDS:   No education needs have been identified at this time  Skin:  Skin Assessment: Reviewed RN Assessment  Last BM:  PTA/unknown  Height:   Ht Readings from Last 1 Encounters:  02/29/2020 5' 11"  (1.803 m)    Weight:   Wt Readings from Last 1 Encounters:  02/25/2020 66.7 kg  Estimated Nutritional Needs:  Kcal:  4680-3212 kcal Protein:  105-120 grams Fluid:  >/= 2.2 L/day     Jarome Matin, MS, RD, LDN, CNSC Inpatient Clinical Dietitian RD pager # available in [facility]  After hours/weekend pager # available in [facility]

## 2020-02-13 NOTE — Progress Notes (Signed)
Patient MEWS is red d/t RR and HR. MD made aware.

## 2020-02-13 NOTE — Progress Notes (Signed)
MD made aware that patients RR is 43, O2 WNL on 3/L. Awaiting new orders.

## 2020-02-13 NOTE — Evaluation (Signed)
Occupational Therapy Evaluation Patient Details Name: Ivan Daniels MRN: [id number] DOB: [birth year] Today's Date: 02/13/2020    History of Present Illness Patient is a 74 year old male with medical history significant of non-small cell lung CA. Presenting with 2 weeks of nausea, vomiting, and generalized weakness. PMH includes alcohol abuse, depression, paranoid schizophrenia   Clinical Impression   Patient with functional deficits listed below impacting safety and independence with self care. Patient with decreased safety and with decreased task initiation, problem solving requiring verbal cues for sequencing bed mobility, safe hand placement with transfers. Patient mod A for bed mobility, min A x2 from elevated bed height to stand and mod A for sitting into chair due to decreased eccentric control.      Follow Up Recommendations  SNF;Other (comment) (unless sister able to provide care then [facility])    Equipment Recommendations  Other (comment);3 in 1 bedside commode (rolling walker)       Precautions / Restrictions Precautions Precautions: Fall Restrictions Weight Bearing Restrictions: No      Mobility Bed Mobility Overal bed mobility: Needs Assistance Bed Mobility: Rolling;Sidelying to Sit Rolling: Mod assist Sidelying to sit: Mod assist;HOB elevated       General bed mobility comments: decreased initiation requires assist to roll with cues to push through R UE and mod A to elevate trunk  Transfers Overall transfer level: Needs assistance Equipment used: Rolling walker (2 wheeled) Transfers: Sit to/from Stand Sit to Stand: Min assist;+2 physical assistance;+2 safety/equipment;From elevated surface         General transfer comment: verbal cues for hand placement and assist to power up to standing, stabilize and min A x1 for ambulation     Balance Overall balance assessment: Needs assistance Sitting-balance support: Feet supported Sitting balance-Leahy Scale: Fair      Standing balance support: Bilateral upper extremity supported Standing balance-Leahy Scale: Poor                             ADL either performed or assessed with clinical judgement   ADL Overall ADL's : Needs assistance/impaired     Grooming: Supervision/safety;Sitting   Upper Body Bathing: Minimal assistance;Sitting   Lower Body Bathing: Maximal assistance;Sit to/from stand;Sitting/lateral leans   Upper Body Dressing : Minimal assistance;Sitting   Lower Body Dressing: Maximal assistance;Sitting/lateral leans;Sit to/from stand   Toilet Transfer: Moderate assistance;Cueing for safety;Cueing for sequencing;Ambulation   Toileting- Clothing Manipulation and Hygiene: Total assistance;Sitting/lateral lean;Sit to/from stand       Functional mobility during ADLs: Moderate assistance General ADL Comments: patient requiring increased assistance with self care due to decreased activity tolerance, strength, safety,                   Pertinent Vitals/Pain Pain Assessment: No/denies pain     Hand Dominance Right   Extremity/Trunk Assessment Upper Extremity Assessment Upper Extremity Assessment: Generalized weakness   Lower Extremity Assessment Lower Extremity Assessment: Defer to PT evaluation       Communication Communication Communication: No difficulties   Cognition Arousal/Alertness: Awake/alert Behavior During Therapy: Flat affect Overall Cognitive Status: Impaired/Different from baseline Area of Impairment: Orientation;Problem solving;Safety/judgement                 Orientation Level: Disoriented to;Time;Situation       Safety/Judgement: Decreased awareness of safety   Problem Solving: Slow processing;Decreased initiation;Requires verbal cues;Requires tactile cues  Home Living Family/patient expects to be discharged to:: Private residence Living Arrangements: Other relatives (sister) Available Help at Discharge:  Family Type of Home: House Home Access: Stairs to enter Technical brewer of Steps: 4 Entrance Stairs-Rails:  (center rail) Home Layout: One level     Bathroom Shower/Tub: Teacher, early years/pre: Standard     Home Equipment: None          Prior Functioning/Environment Level of Independence: Needs assistance  Gait / Transfers Assistance Needed: no AD at baseline for ambulation ADL's / Homemaking Assistance Needed: sister helps as needed with dressing, manages his medications/IADLs            OT Problem List: Decreased strength;Decreased activity tolerance;Impaired balance (sitting and/or standing);Decreased safety awareness;Decreased knowledge of use of DME or AE      OT Treatment/Interventions: Self-care/ADL training;Therapeutic exercise;DME and/or AE instruction;Therapeutic activities;Patient/family education;Balance training;Energy conservation    OT Goals(Current goals can be found in the care plan section) Acute Rehab OT Goals Patient Stated Goal: agreeable to get up to chair OT Goal Formulation: With patient Time For Goal Achievement: 02/27/20 Potential to Achieve Goals: Good  OT Frequency: Min 2X/week           Co-evaluation PT/OT/SLP Co-Evaluation/Treatment: Yes Reason for Co-Treatment: For patient/therapist safety;To address functional/ADL transfers   OT goals addressed during session: ADL's and self-care      AM-PAC OT "6 Clicks" Daily Activity     Outcome Measure Help from another person eating meals?: A Little Help from another person taking care of personal grooming?: A Little Help from another person toileting, which includes using toliet, bedpan, or urinal?: A Lot Help from another person bathing (including washing, rinsing, drying)?: A Lot Help from another person to put on and taking off regular upper body clothing?: A Little Help from another person to put on and taking off regular lower body clothing?: A Lot 6 Click Score:  15   End of Session Equipment Utilized During Treatment: Rolling walker;Gait belt;Oxygen Nurse Communication: Mobility status  Activity Tolerance: Patient tolerated treatment well Patient left: in chair;with call bell/phone within reach;with chair alarm set;with family/visitor present  OT Visit Diagnosis: Other abnormalities of gait and mobility (R26.89);Unsteadiness on feet (R26.81);Muscle weakness (generalized) (M62.81);Other symptoms and signs involving cognitive function                Time: 8144-8185 OT Time Calculation (min): 23 min Charges:  OT General Charges $OT Visit: 1 Visit OT Evaluation $OT Eval Low Complexity: [place] OT OT pager: [place] 02/13/2020, 1:22 PM

## 2020-02-13 NOTE — Evaluation (Signed)
Physical Therapy Evaluation Patient Details Name: Ivan Daniels MRN: [id number] DOB: 10-06-1945 Today's Date: 02/13/2020   History of Present Illness  Patient is a 74 year old male with medical history significant of non-small cell lung CA. Presenting with 2 weeks of nausea, vomiting, and generalized weakness. PMH includes alcohol abuse, depression, paranoid schizophrenia  Clinical Impression  Pt admitted as above and presenting with functional mobility limitations 2* generalized weakness, ambulatory balance deficits and limited endurance.  Pt hopes to progress to dc home with family assist and would benefit from follow up HHPT to further address deficits.    Follow Up Recommendations Home health PT    Equipment Recommendations  Rolling walker with 5" wheels    Recommendations for Other Services       Precautions / Restrictions Precautions Precautions: Fall Restrictions Weight Bearing Restrictions: No      Mobility  Bed Mobility Overal bed mobility: Needs Assistance Bed Mobility: Rolling;Sidelying to Sit Rolling: Mod assist Sidelying to sit: Mod assist;HOB elevated       General bed mobility comments: decreased initiation requires assist to roll with cues to push through R UE and mod A to elevate trunk  Transfers Overall transfer level: Needs assistance Equipment used: Rolling walker (2 wheeled) Transfers: Sit to/from Stand Sit to Stand: Min assist;+2 physical assistance;+2 safety/equipment;From elevated surface         General transfer comment: verbal cues for hand placement and assist to power up to standing, stabilize and min A x1 for ambulation   Ambulation/Gait Ambulation/Gait assistance: Min assist;+2 safety/equipment Gait Distance (Feet): 52 Feet Assistive device: Rolling walker (2 wheeled) Gait Pattern/deviations: Step-to pattern;Decreased step length - right;Decreased step length - left;Shuffle;Trunk flexed Gait velocity: decr   General Gait Details:  short shuffling step with cues for posture and position from RW and physical assist for balance and RW management  Stairs            Wheelchair Mobility    Modified Rankin (Stroke Patients Only)       Balance Overall balance assessment: Needs assistance Sitting-balance support: Feet supported Sitting balance-Leahy Scale: Fair     Standing balance support: Bilateral upper extremity supported Standing balance-Leahy Scale: Poor                               Pertinent Vitals/Pain Pain Assessment: No/denies pain    Home Living Family/patient expects to be discharged to:: Private residence Living Arrangements: Other relatives Available Help at Discharge: Family Type of Home: House Home Access: Stairs to enter Entrance Stairs-Rails:  (center rail) Technical brewer of Steps: 4 Home Layout: One level Home Equipment: None      Prior Function Level of Independence: Needs assistance   Gait / Transfers Assistance Needed: no AD at baseline for ambulation  ADL's / Homemaking Assistance Needed: sister helps as needed with dressing, manages his medications/IADLs        Hand Dominance   Dominant Hand: Right    Extremity/Trunk Assessment   Upper Extremity Assessment Upper Extremity Assessment: Generalized weakness    Lower Extremity Assessment Lower Extremity Assessment: Generalized weakness    Cervical / Trunk Assessment Cervical / Trunk Assessment: Kyphotic  Communication   Communication: No difficulties  Cognition Arousal/Alertness: Awake/alert Behavior During Therapy: Flat affect Overall Cognitive Status: Impaired/Different from baseline Area of Impairment: Orientation;Problem solving;Safety/judgement                 Orientation Level: Disoriented to;Time;Situation  Safety/Judgement: Decreased awareness of safety   Problem Solving: Slow processing;Decreased initiation;Requires verbal cues;Requires tactile cues         General Comments      Exercises     Assessment/Plan    PT Assessment Patient needs continued PT services  PT Problem List Decreased strength;Decreased activity tolerance;Decreased balance;Decreased mobility;Decreased knowledge of use of DME       PT Treatment Interventions DME instruction;Gait training;Stair training;Functional mobility training;Therapeutic activities;Therapeutic exercise;Patient/family education    PT Goals (Current goals can be found in the Care Plan section)  Acute Rehab PT Goals Patient Stated Goal: agreeable to get up to chair PT Goal Formulation: With patient Time For Goal Achievement: 02/27/20 Potential to Achieve Goals: Fair    Frequency Min 3X/week   Barriers to discharge        Co-evaluation PT/OT/SLP Co-Evaluation/Treatment: Yes Reason for Co-Treatment: For patient/therapist safety PT goals addressed during session: Mobility/safety with mobility OT goals addressed during session: ADL's and self-care       AM-PAC PT "6 Clicks" Mobility  Outcome Measure Help needed turning from your back to your side while in a flat bed without using bedrails?: A Lot Help needed moving from lying on your back to sitting on the side of a flat bed without using bedrails?: A Lot Help needed moving to and from a bed to a chair (including a wheelchair)?: A Lot Help needed standing up from a chair using your arms (e.g., wheelchair or bedside chair)?: A Lot Help needed to walk in hospital room?: A Little Help needed climbing 3-5 steps with a railing? : A Lot 6 Click Score: 13    End of Session Equipment Utilized During Treatment: Gait belt Activity Tolerance: Patient limited by fatigue Patient left: in chair;with call bell/phone within reach;with chair alarm set;with family/visitor present Nurse Communication: Mobility status PT Visit Diagnosis: Muscle weakness (generalized) (M62.81);Difficulty in walking, not elsewhere classified (R26.2)    Time:  7116-5790 PT Time Calculation (min) (ACUTE ONLY): 23 min   Charges:   PT Evaluation $PT Eval Low Complexity: 1 Low          [place] [facility] Pager [phone] Office [phone]   Onetta Spainhower 02/13/2020, 4:01 PM

## 2020-02-14 ENCOUNTER — Inpatient Hospital Stay (HOSPITAL_COMMUNITY): Payer: Medicare Other

## 2020-02-14 ENCOUNTER — Encounter (HOSPITAL_COMMUNITY): Payer: Self-pay | Admitting: Anesthesiology

## 2020-02-14 DIAGNOSIS — E876 Hypokalemia: Secondary | ICD-10-CM | POA: Diagnosis not present

## 2020-02-14 DIAGNOSIS — R1310 Dysphagia, unspecified: Secondary | ICD-10-CM

## 2020-02-14 DIAGNOSIS — I5021 Acute systolic (congestive) heart failure: Secondary | ICD-10-CM | POA: Diagnosis not present

## 2020-02-14 DIAGNOSIS — T17908A Unspecified foreign body in respiratory tract, part unspecified causing other injury, initial encounter: Secondary | ICD-10-CM | POA: Diagnosis not present

## 2020-02-14 DIAGNOSIS — R112 Nausea with vomiting, unspecified: Secondary | ICD-10-CM | POA: Diagnosis not present

## 2020-02-14 DIAGNOSIS — J189 Pneumonia, unspecified organism: Secondary | ICD-10-CM | POA: Diagnosis not present

## 2020-02-14 LAB — COMPREHENSIVE METABOLIC PANEL
ALT: 20 U/L (ref 0–44)
AST: 33 U/L (ref 15–41)
Albumin: 2.6 g/dL — ABNORMAL LOW (ref 3.5–5.0)
Alkaline Phosphatase: 33 U/L — ABNORMAL LOW (ref 38–126)
Anion gap: 10 (ref 5–15)
BUN: <5 mg/dL — ABNORMAL LOW (ref 8–23)
CO2: 23 mmol/L (ref 22–32)
Calcium: 8.1 mg/dL — ABNORMAL LOW (ref 8.9–10.3)
Chloride: 101 mmol/L (ref 98–111)
Creatinine, Ser: 0.76 mg/dL (ref 0.61–1.24)
GFR, Estimated: >60 mL/min (ref >60)
Glucose, Bld: 132 mg/dL — ABNORMAL HIGH (ref 70–99)
Potassium: 3.5 mmol/L (ref 3.5–5.1)
Sodium: 134 mmol/L — ABNORMAL LOW (ref 135–145)
Total Bilirubin: 0.8 mg/dL (ref 0.3–1.2)
Total Protein: 5.9 g/dL — ABNORMAL LOW (ref 6.5–8.1)

## 2020-02-14 LAB — CBC
HCT: 29.9 % — ABNORMAL LOW (ref 39.0–52.0)
Hemoglobin: 9.7 g/dL — ABNORMAL LOW (ref 13.0–17.0)
MCH: 28.4 pg (ref 26.0–34.0)
MCHC: 32.4 g/dL (ref 30.0–36.0)
MCV: 87.7 fL (ref 80.0–100.0)
Platelets: 291 10*3/uL (ref 150–400)
RBC: 3.41 MIL/uL — ABNORMAL LOW (ref 4.22–5.81)
RDW: 15.9 % — ABNORMAL HIGH (ref 11.5–15.5)
WBC: 4.3 10*3/uL (ref 4.0–10.5)
nRBC: 0 % (ref 0.0–0.2)

## 2020-02-14 LAB — ECHOCARDIOGRAM COMPLETE
Height: 71 in
Weight: 2352.75 oz

## 2020-02-14 LAB — MRSA PCR SCREENING: MRSA by PCR: NEGATIVE

## 2020-02-14 MED ORDER — ENOXAPARIN SODIUM 40 MG/0.4ML ~~LOC~~ SOLN
40.0000 mg | SUBCUTANEOUS | Status: DC
  Administered 2020-02-16 – 2020-02-17 (×2): 40 mg via SUBCUTANEOUS
  Filled 2020-02-14 (×2): qty 0.4

## 2020-02-14 MED ORDER — FUROSEMIDE 10 MG/ML IJ SOLN
40.0000 mg | Freq: Once | INTRAMUSCULAR | Status: AC
  Administered 2020-02-14: 40 mg via INTRAVENOUS
  Filled 2020-02-14: qty 4

## 2020-02-14 MED ORDER — PANTOPRAZOLE SODIUM 40 MG IV SOLR
40.0000 mg | Freq: Two times a day (BID) | INTRAVENOUS | Status: DC
  Administered 2020-02-14 – 2020-02-19 (×10): 40 mg via INTRAVENOUS
  Filled 2020-02-14 (×10): qty 40

## 2020-02-14 MED ORDER — IOHEXOL 300 MG/ML  SOLN
75.0000 mL | Freq: Once | INTRAMUSCULAR | Status: AC | PRN
  Administered 2020-02-14: 75 mL via INTRAVENOUS

## 2020-02-14 MED ORDER — PANTOPRAZOLE SODIUM 40 MG PO TBEC: 40.0000 mg | DELAYED_RELEASE_TABLET | Freq: Two times a day (BID) | ORAL | Status: DC

## 2020-02-14 MED ORDER — SODIUM CHLORIDE 0.9 % IV SOLN: INTRAVENOUS | Status: DC

## 2020-02-14 MED ORDER — DEXTROSE-NACL 5-0.9 % IV SOLN: INTRAVENOUS | Status: DC

## 2020-02-14 NOTE — Progress Notes (Signed)
  Echocardiogram 2D Echocardiogram has been performed.  Jannett Celestine 02/14/2020, 3:38 PM

## 2020-02-14 NOTE — Progress Notes (Signed)
PT Cancellation Note  Patient Details Name: Ivan Daniels MRN: [id number] DOB: Aug 08, 1945   Cancelled Treatment:     PT deferred this am, RN advises pt to transfer to tele.  Will follow.   [name] 02/14/2020, 1:57 PM

## 2020-02-14 NOTE — Progress Notes (Addendum)
PROGRESS NOTE    Ivan Daniels  UXN:[id number] DOB: August 22, 1945 DOA: 02/06/2020 PCP: [name], FNP    Brief Narrative:   74 y.o. male with medical history significant of non-small cell lung CA. Presenting with 2 weeks of nausea, vomiting, and generalized weakness. Hx is per sister at bedside. She notes that he was in his normal state of health until about 2 weeks ago. At that time, she notes that he complained about nausea and was unable to keep heavy food down. This progressed to problems w/ holding down liquids and his medications. She denies he had any complaints of ab pain, CP, or any other symptoms. She noted that he was becoming progressively weak and required more and more help with daily activities. She tried seeing his regular doctors, but was unable to get relief. She decided this morning it was time to come to the ED. She reports no other aggravating or alleviating factors. She reports no other treatments given for these symptoms. Of note, the patient has chemotherapy ongoing for his NSCLC.   ED Course: CXR was positive for RLL PNA. He was noted to be tachycardic and tachypneic. TRH was called for admission.    Assessment & Plan:   Active Problems:   Intractable nausea and vomiting  RLL PNA, suspect aspiration pneumonia - Pt is continued on azithro, rocephin (received doses in ED)  - Currently on [facility]. Pt with continued tachypnea with RR in the 40's however pt denies feeling sob -continue  IS, PRN anti-tussives, PRN breathing tx as needed -Appreciate SLP eval. Pt was cleared for dysphagia 3 with thin liquids. Concern was made for possible recurrent esophageal stricture. Pt previously required EGD dilation in 2018 -Will keep NPO for now, consulted GI for assistance. Recommendation for EGD -Repeat bmet and CBC in AM -Given tachypnea, repeat CXR ordered. Per my own review, appears to have worse B patchy infiltrates and B effusions. Will give trial of 40mg  IV lasix.  Have ordered 2d echo UPDATE: Spoke with pt's daughter over phone. Pt's daughter states she was present at time of initial onset of symptoms. States she witnessed patient choking during dinner with subsequent bouts of heavy emesis. Daughter agrees pt likely aspirated during this episode  Intractable N/V -Seems stable this AM without recurrence thus far -abd xray personally reviewed by myself. Findings of non-obstructive bowel gas noted -Question if related to current chemo for lung cancer. Most recent chemo infusion was 9/16  Anorexia -abd xray per above -continue with anti-emetics as needed - Holding IVF for now given lasix given above  Generalized weakness     - PT/OT consulted  - Therapy recs for HHPT noted  Recurrent NSCLC S3a     - receiving chemo w/ oncology (Dr. Earlie Server) -See above, pt underwent last chemo on 9/16  Hypokalemia Hyponatremia (mild) -Remains stable -Potassium level improved since presentation -Repeat bmet in AM  Normocytic anemia Hx of iron deficiency anemia - no evidence of bleed - Hgb seems to be near baseline at this time -Recheck cbc in AM  Hx of schizophrenia - continue home meds (haloperidol)  DVT prophylaxis: Lovenox subq Code Status: DNR Family Communication: Pt in room, family is at bedside  Status is: Inpatient  Remains inpatient appropriate because:Unsafe d/c plan, IV treatments appropriate due to intensity of illness or inability to take PO and Inpatient level of care appropriate due to severity of illness   Dispo: The patient is from: Home  Anticipated d/c is to: SNF              Anticipated d/c date is: > 3 days              Patient currently is not medically stable to d/c.   Consultants:   GI  Procedures:     Antimicrobials: Anti-infectives (From admission, onward)   Start     Dose/Rate Route Frequency Ordered Stop   02/13/20 1000  cefTRIAXone (ROCEPHIN) 2 g in sodium chloride 0.9 % 100 mL IVPB         2 g 200 mL/hr over 30 Minutes Intravenous Every 24 hours 02/18/2020 2050 02/17/20 0959   02/13/20 1000  azithromycin (ZITHROMAX) 500 mg in sodium chloride 0.9 % 250 mL IVPB        500 mg 250 mL/hr over 60 Minutes Intravenous Every 24 hours 02/11/2020 2050 02/17/20 0959   02/10/2020 1230  cefTRIAXone (ROCEPHIN) 1 g in sodium chloride 0.9 % 100 mL IVPB        1 g 200 mL/hr over 30 Minutes Intravenous  Once 02/08/2020 1222 02/28/2020 1535   02/25/2020 1230  azithromycin (ZITHROMAX) 500 mg in sodium chloride 0.9 % 250 mL IVPB        500 mg 250 mL/hr over 60 Minutes Intravenous  Once 02/26/2020 1222 02/29/2020 1851      Subjective: Denies chest pain or feeling sob despite pt having tachhypnea  Objective: Vitals:   02/14/20 0400 02/14/20 0751 02/14/20 0954 02/14/20 1211  BP: 132/61  133/63 115/61  Pulse: (!) 120  (!) 122 (!) 117  Resp: (!) 40  (!) 40 (!) 40  Temp: 99 F (37.2 C)  (!) 100.5 F (38.1 C) 99.8 F (37.7 C)  TempSrc: Oral  Oral Oral  SpO2: 92% 93% 99% 96%  Weight:      Height:        Intake/Output Summary (Last 24 hours) at 02/14/2020 1337 Last data filed at 02/14/2020 1000 Gross per 24 hour  Intake 3431.32 ml  Output 650 ml  Net 2781.32 ml   Filed Weights   03/07/2020 2050  Weight: 66.7 kg    Examination: General exam: Awake, laying in bed, in nad Respiratory system: increased resp effort, no wheezing Cardiovascular system: regular rate, s1, s2 Gastrointestinal system: Soft, nondistended, positive BS Central nervous system: CN2-12 grossly intact, strength intact Extremities: Perfused, no clubbing Skin: Normal skin turgor, no notable skin lesions seen Psychiatry: Mood normal // no visual hallucinations     Data Reviewed: I have personally reviewed following labs and imaging studies  CBC: Recent Labs  Lab 02/11/20 1425 02/15/2020 1137 02/23/2020 2130 02/13/20 0333 02/14/20 0355  WBC 4.0 4.9 4.7 3.2* 4.3  NEUTROABS 2.1  --   --   --   --   HGB 10.2* 10.1* 9.8* 9.3* 9.7*   HCT 31.1* 31.6* 30.0* 28.6* 29.9*  MCV 84.7 88.3 87.7 87.7 87.7  PLT 286 312 267 252 426   Basic Metabolic Panel: Recent Labs  Lab 02/11/20 1425 02/26/2020 1137 02/13/2020 2130 02/13/20 0333 02/14/20 0355  NA 133* 132*  --  134* 134*  K 3.4* 3.4*  --  3.6 3.5  CL 97* 99  --  104 101  CO2 27 22  --  21* 23  GLUCOSE 90 97  --  112* 132*  BUN 9 9  --  7* <5*  CREATININE 0.83 0.92 0.85 0.78 0.76  CALCIUM 9.4 8.3*  --  7.9* 8.1*  MG  --   --  1.9  --   --    GFR: Estimated Creatinine Clearance: 78.7 mL/min (by C-G formula based on SCr of 0.76 mg/dL). Liver Function Tests: Recent Labs  Lab 02/11/20 1425 02/13/20 0333 02/14/20 0355  AST 43* 33 33  ALT 27 22 20   ALKPHOS 41 30* 33*  BILITOT 0.6 1.0 0.8  PROT 6.9 5.8* 5.9*  ALBUMIN 2.8* 2.6* 2.6*   No results for input(s): LIPASE, AMYLASE in the last 168 hours. No results for input(s): AMMONIA in the last 168 hours. Coagulation Profile: No results for input(s): INR, PROTIME in the last 168 hours. Cardiac Enzymes: No results for input(s): CKTOTAL, CKMB, CKMBINDEX, TROPONINI in the last 168 hours. BNP (last 3 results) No results for input(s): PROBNP in the last 8760 hours. HbA1C: No results for input(s): HGBA1C in the last 72 hours. CBG: Recent Labs  Lab 02/28/2020 1051  GLUCAP 82   Lipid Profile: No results for input(s): CHOL, HDL, LDLCALC, TRIG, CHOLHDL, LDLDIRECT in the last 72 hours. Thyroid Function Tests: Recent Labs    02/11/20 1425  TSH <0.080*   Anemia Panel: No results for input(s): VITAMINB12, FOLATE, FERRITIN, TIBC, IRON, RETICCTPCT in the last 72 hours. Sepsis Labs: Recent Labs  Lab 02/09/2020 1240  LATICACIDVEN 1.0    Recent Results (from the past 240 hour(s))  Respiratory Panel by RT PCR (Flu A&B, Covid) - Nasopharyngeal Swab     Status: None   Collection Time: 02/07/20 10:10 AM   Specimen: Nasopharyngeal Swab  Result Value Ref Range Status   SARS Coronavirus 2 by RT PCR NEGATIVE NEGATIVE Final      Comment: (NOTE) SARS-CoV-2 target nucleic acids are NOT DETECTED.  The SARS-CoV-2 RNA is generally detectable in upper respiratoy specimens during the acute phase of infection. The lowest concentration of SARS-CoV-2 viral copies this assay can detect is 131 copies/mL. A negative result does not preclude SARS-Cov-2 infection and should not be used as the sole basis for treatment or other patient management decisions. A negative result may occur with  improper specimen collection/handling, submission of specimen other than nasopharyngeal swab, presence of viral mutation(s) within the areas targeted by this assay, and inadequate number of viral copies (<131 copies/mL). A negative result must be combined with clinical observations, patient history, and epidemiological information. The expected result is Negative.  Fact Sheet for Patients:  PinkCheek.be  Fact Sheet for Healthcare Providers:  GravelBags.it  This test is no t yet approved or cleared by the [place] FDA and  has been authorized for detection and/or diagnosis of SARS-CoV-2 by FDA under an Emergency Use Authorization (EUA). This EUA will remain  in effect (meaning this test can be used) for the duration of the COVID-19 declaration under Section 564(b)(1) of the Act, 21 U.S.C. section 360bbb-3(b)(1), unless the authorization is terminated or revoked sooner.     Influenza A by PCR NEGATIVE NEGATIVE Final   Influenza B by PCR NEGATIVE NEGATIVE Final    Comment: (NOTE) The Xpert Xpress SARS-CoV-2/FLU/RSV assay is intended as an aid in  the diagnosis of influenza from Nasopharyngeal swab specimens and  should not be used as a sole basis for treatment. Nasal washings and  aspirates are unacceptable for Xpert Xpress SARS-CoV-2/FLU/RSV  testing.  Fact Sheet for Patients: PinkCheek.be  Fact Sheet for Healthcare  Providers: GravelBags.it  This test is not yet approved or cleared by the [place] FDA and  has been authorized for detection and/or diagnosis of SARS-CoV-2 by  FDA under an Emergency Use Authorization (  EUA). This EUA will remain  in effect (meaning this test can be used) for the duration of the  Covid-19 declaration under Section 564(b)(1) of the Act, 21  U.S.C. section 360bbb-3(b)(1), unless the authorization is  terminated or revoked. Performed at [facility], [place] [address], [place], [place] [address]   Blood culture (routine x 2)     Status: None (Preliminary result)   Collection Time: 02/24/2020 12:40 PM   Specimen: BLOOD  Result Value Ref Range Status   Specimen Description   Final    BLOOD LEFT ANTECUBITAL Performed at [place] [address] [place], [place] [address]    Special Requests   Final    BOTTLES DRAWN AEROBIC AND ANAEROBIC Blood Culture adequate volume Performed at [facility] [address] [place], [place] [address]    Culture   Final    NO GROWTH 2 DAYS Performed at [facility] [address], [place], [place] [address]    Report Status PENDING  Incomplete  Blood culture (routine x 2)     Status: None (Preliminary result)   Collection Time: 02/11/2020 12:50 PM   Specimen: BLOOD  Result Value Ref Range Status   Specimen Description   Final    BLOOD RIGHT ANTECUBITAL Performed at [facility] [address], [place], [place] [address]    Special Requests   Final    BOTTLES DRAWN AEROBIC AND ANAEROBIC Blood Culture adequate volume   Culture   Final    NO GROWTH 2 DAYS Performed at [facility], [place] [address], [place], [place] [address]    Report Status PENDING  Incomplete  Respiratory Panel by RT PCR (Flu A&B, Covid) - Nasopharyngeal Swab     Status: None   Collection Time: 02/14/2020 12:55 PM   Specimen: Nasopharyngeal  Swab  Result Value Ref Range Status   SARS Coronavirus 2 by RT PCR NEGATIVE NEGATIVE Final    Comment: (NOTE) SARS-CoV-2 target nucleic acids are NOT DETECTED.  The SARS-CoV-2 RNA is generally detectable in upper respiratoy specimens during the acute phase of infection. The lowest concentration of SARS-CoV-2 viral copies this assay can detect is 131 copies/mL. A negative result does not preclude SARS-Cov-2 infection and should not be used as the sole basis for treatment or other patient management decisions. A negative result may occur with  improper specimen collection/handling, submission of specimen other than nasopharyngeal swab, presence of viral mutation(s) within the areas targeted by this assay, and inadequate number of viral copies (<131 copies/mL). A negative result must be combined with clinical observations, patient history, and epidemiological information. The expected result is Negative.  Fact Sheet for Patients:  PinkCheek.be  Fact Sheet for Healthcare Providers:  GravelBags.it  This test is no t yet approved or cleared by the [place] FDA and  has been authorized for detection and/or diagnosis of SARS-CoV-2 by FDA under an Emergency Use Authorization (EUA). This EUA will remain  in effect (meaning this test can be used) for the duration of the COVID-19 declaration under Section 564(b)(1) of the Act, 21 U.S.C. section 360bbb-3(b)(1), unless the authorization is terminated or revoked sooner.     Influenza A by PCR NEGATIVE NEGATIVE Final   Influenza B by PCR NEGATIVE NEGATIVE Final    Comment: (NOTE) The Xpert Xpress SARS-CoV-2/FLU/RSV assay is intended as an aid in  the diagnosis of influenza from Nasopharyngeal swab specimens and  should not be used as  a sole basis for treatment. Nasal washings and  aspirates are unacceptable for Xpert Xpress SARS-CoV-2/FLU/RSV  testing.  Fact Sheet for  Patients: PinkCheek.be  Fact Sheet for Healthcare Providers: GravelBags.it  This test is not yet approved or cleared by the [place] FDA and  has been authorized for detection and/or diagnosis of SARS-CoV-2 by  FDA under an Emergency Use Authorization (EUA). This EUA will remain  in effect (meaning this test can be used) for the duration of the  Covid-19 declaration under Section 564(b)(1) of the Act, 21  U.S.C. section 360bbb-3(b)(1), unless the authorization is  terminated or revoked. Performed at [facility], [place] [address] [place], [place] [address]      Radiology Studies: DG Abd 1 View  Result Date: 03/04/2020 CLINICAL DATA:  Decreased appetite EXAM: ABDOMEN - 1 VIEW COMPARISON:  February 07, 2020 FINDINGS: Relatively mild volume of stool. Postoperative change in lower abdomen. No bowel dilatation or air-fluid level to suggest bowel obstruction. No free air. Vascular calcifications noted in the pelvis. Visualized lung bases clear. IMPRESSION: No bowel obstruction or free air on supine examination. Postoperative changes noted. Electronically Signed   By: Lowella Grip III M.D.   On: 02/07/2020 14:24    Scheduled Meds: . chlorhexidine  15 mL Mouth Rinse BID  . Chlorhexidine Gluconate Cloth  6 each Topical Daily  . [START ON 02/15/2020] enoxaparin (LOVENOX) injection  40 mg Subcutaneous Q24H  . latanoprost  1 drop Both Eyes QHS  . mouth rinse  15 mL Mouth Rinse q12n4p  . pantoprazole  40 mg Oral BID  . umeclidinium-vilanterol  1 puff Inhalation Daily   Continuous Infusions: . azithromycin 500 mg (02/14/20 0903)  . cefTRIAXone (ROCEPHIN)  IV 2 g (02/14/20 0818)     LOS: 2 days   Marylu Lund, MD [facility] Pager On Amion  If 7PM-7AM, please contact night-coverage 02/14/2020, 1:37 PM

## 2020-02-14 NOTE — Consult Note (Signed)
Consultation  Referring Provider:   Dr. Wyline Copas Primary Care Physician:  [name], FNP Primary Gastroenterologist: Dr. Henrene Pastor        Reason for Consultation: Dysphagia             HPI:   Ivan Daniels is a 74 y.o. male with a past medical history significant for non-small cell lung cancer, who presented to the ER on 03/04/2020 with 2 weeks of nausea, vomiting and generalized weakness.  We are consulted in regards to dysphagia.    At time of admission his sister accompanied him and described that he had complained about nausea and was able to keep down heavy food.  This progressed with problems and holding down even liquids and his medications.  He then became progressively weak and required more and more help with daily activities.  She brought him to the ER.    02/13/2020 patient seen by speech pathology for bedside swallow eval.  It was thought that his symptoms including coughing with meals and coughing during the night may be from an esophageal source.  Apparently he described mild discomfort with his swallowing.    Today, patient is found with his daughter by his bedside and his sister on the phone who takes him to all of his appointments and is his POA.  They explained that for at least the past month he has been having trouble swallowing notuing that he will take 1 bite of food and then cough/gag and seem like this is stuck.  This is only gotten worse and he is gotten more and more weak.  Patient denies any abdominal pain.    Denies fever, chills or blood in his stools.  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 - Normal  stomach. - A single angiodysplastic lesion in the duodenum. - No specimens collected   Past Medical History:  Diagnosis Date  . ALCOHOL ABUSE 11/12/2009   Qualifier: Diagnosis of  By: Hassell Done FNP, Tori Milks    . Allergy   . Anemia   . Anxiety   . BENIGN PROSTATIC HYPERTROPHY, HX OF 12/19/2006   Qualifier: Diagnosis of  By: Radene Ou MD, [place]    . Blood transfusion without reported diagnosis 2017  . Clotting disorder ([place])    takes PLAVIX  . Depression   . Emphysema of lung ([place])   . Full code status 02/10/2015  . GERD (gastroesophageal reflux disease)   . Hyperlipidemia   . Lung mass 01/25/2015   3.7 x 3.6 cm RUL spiculated lung mass with 2.0 cm right paratracheal lymph node suspicious for bronchogenic CA  . Non-small cell lung cancer ([place]) 01/27/15   non small-cell,squamous cell ca RUL  . Paranoid schizophrenia ([place]) 10/31/2009   Qualifier: Diagnosis of  By: Jorene Minors, [place]    . Primary spontaneous pneumothorax, left 01/25/2015  . TOBACCO ABUSE 05/24/2009   Qualifier: Diagnosis of  By: Hassell Done FNP, Tori Milks      Past Surgical History:  Procedure Laterality Date  . APPENDECTOMY    . CHEST TUBE INSERTION Left   . COLONOSCOPY    . INSERTION CENTRAL VENOUS ACCESS DEVICE W/ SUBCUTANEOUS PORT Right   . LUNG BIOPSY Right 01/27/2015  Procedure: Right Upper Lobe Bronchus BIOPSY;  Surgeon: Grace Isaac, MD;  Location: [place];  Service: Thoracic;  Laterality: Right;  Marland Kitchen VIDEO BRONCHOSCOPY WITH ENDOBRONCHIAL ULTRASOUND N/A 01/27/2015   Procedure: VIDEO BRONCHOSCOPY WITH ENDOBRONCHIAL ULTRASOUND;  Surgeon: Grace Isaac, MD;  Location: [facility] OR;  Service: Thoracic;  Laterality: N/A;    Family History  Problem Relation Age of Onset  . Breast cancer Sister   . Diabetes Sister   . Hyperlipidemia Sister   . Lung disease Neg Hx   . Colon cancer Neg Hx   . Esophageal cancer Neg Hx   . Stomach cancer Neg Hx   . Pancreatic cancer Neg Hx   . Prostate cancer Neg Hx   . Rectal cancer Neg  Hx      Social History   Tobacco Use  . Smoking status: Former Smoker    Packs/day: 0.25    Years: 52.00    Pack years: 13.00    Types: Cigarettes    Quit date: 01/07/2016    Years since quitting: 4.1  . Smokeless tobacco: Never Used  . Tobacco comment: Peak rate of 4-5 cigarettes per day  Vaping Use  . Vaping Use: Never used  Substance Use Topics  . Alcohol use: Not Currently    Alcohol/week: 0.0 standard drinks  . Drug use: No    Prior to Admission medications   Medication Sig Start Date End Date Taking? Authorizing Provider  aspirin EC 81 MG tablet Take 81 mg by mouth 2 (two) times daily.   Yes [provider]  benztropine (COGENTIN) 0.5 MG tablet Take 0.5 mg by mouth daily.    Yes [provider]  chlorhexidine (PERIDEX) 0.12 % solution 5 mLs by Mouth Rinse route as needed (gingivitis).  06/15/15  Yes [provider]  clopidogrel (PLAVIX) 75 MG tablet Take 75 mg by mouth daily.  12/09/12  Yes [provider]  ferrous fumarate (HEMOCYTE - 106 MG FE) 325 (106 FE) MG TABS tablet Take 1 tablet by mouth daily.    Yes [provider]  haloperidol (HALDOL) 5 MG tablet Take 5 mg by mouth at bedtime.  02/21/13  Yes [provider]  ondansetron (ZOFRAN) 8 MG tablet Take 1 tablet (8 mg total) by mouth every 8 (eight) hours as needed for nausea or vomiting. 01/08/19  Yes Curt Bears, MD  simvastatin (ZOCOR) 20 MG tablet Take 20 mg by mouth at bedtime.  02/14/13  Yes [provider]  umeclidinium-vilanterol (ANORO ELLIPTA) 62.5-25 MCG/INH AEPB Inhale 1 puff into the lungs daily. 04/11/19  Yes Tanda Rockers, MD  VYZULTA 0.024 % SOLN Place 1 drop into both eyes daily.  09/19/19  Yes [provider]  NONFORMULARY OR COMPOUNDED [place]  Combination Pain Cream -  Baclofen 2%, Doxepin 5%, Gabapentin 6%, Topiramate 2%, Pentoxifylline 3% Apply 1-2 grams to affected area 3-4 times daily Qty. 120 gm 3  refills Patient not taking: Reported on 02/29/2020 07/03/17   Gardiner Barefoot, DPM    Current Facility-Administered Medications  Medication Dose Route Frequency Provider Last Rate Last Admin  . acetaminophen (TYLENOL) tablet 650 mg  650 mg Oral Q6H PRN Marylyn Ishihara, Tyrone A, DO       Or  . acetaminophen (TYLENOL) suppository 650 mg  650 mg Rectal Q6H PRN Marylyn Ishihara, Tyrone A, DO      . azithromycin (ZITHROMAX) 500 mg in sodium chloride 0.9 % 250 mL IVPB  500 mg Intravenous Q24H Kyle, Tyrone A, DO  250 mL/hr at 02/14/20 0903 500 mg at 02/14/20 0903  . cefTRIAXone (ROCEPHIN) 2 g in sodium chloride 0.9 % 100 mL IVPB  2 g Intravenous Q24H Kyle, Tyrone A, DO 200 mL/hr at 02/14/20 0818 2 g at 02/14/20 0818  . chlorhexidine (PERIDEX) 0.12 % solution 15 mL  15 mL Mouth Rinse BID Donne Hazel, MD   15 mL at 02/13/20 2120  . chlorhexidine (PERIDEX) 0.12 % solution 5 mL  5 mL Mouth Rinse PRN Marylyn Ishihara, Tyrone A, DO      . Chlorhexidine Gluconate Cloth 2 % PADS 6 each  6 each Topical Daily Kyle, Tyrone A, DO   6 each at 02/13/20 1029  . dextrose 5 %-0.9 % sodium chloride infusion   Intravenous Continuous Kyle, Tyrone A, DO 100 mL/hr at 02/14/20 0639 Rate Verify at 02/14/20 0639  . enoxaparin (LOVENOX) injection 40 mg  40 mg Subcutaneous Q24H Kyle, Tyrone A, DO   40 mg at 02/13/20 2120  . latanoprost (XALATAN) 0.005 % ophthalmic solution 1 drop  1 drop Both Eyes QHS Donne Hazel, MD   1 drop at 02/13/20 2121  . levalbuterol (XOPENEX) nebulizer solution 0.63 mg  0.63 mg Nebulization Q6H PRN Marylyn Ishihara, Tyrone A, DO   0.63 mg at 02/13/20 0548  . MEDLINE mouth rinse  15 mL Mouth Rinse q12n4p Donne Hazel, MD      . ondansetron [facility]) tablet 4 mg  4 mg Oral Q6H PRN Marylyn Ishihara, Tyrone A, DO       Or  . ondansetron (ZOFRAN) injection 4 mg  4 mg Intravenous Q6H PRN Marylyn Ishihara, Tyrone A, DO   4 mg at 02/13/20 1145  . sodium chloride flush (NS) 0.9 % injection 10-40 mL  10-40 mL Intracatheter PRN Marylyn Ishihara, Tyrone A, DO      .  umeclidinium-vilanterol (ANORO ELLIPTA) 62.5-25 MCG/INH 1 puff  1 puff Inhalation Daily Kyle, Tyrone A, DO   1 puff at 02/14/20 0751    Allergies as of 02/11/2020 - Review Complete 02/15/2020  Allergen Reaction Noted  . Benztropine mesylate    . Chlorpromazine hcl  08/26/2008     Review of Systems:    Constitutional: No fever or chills Skin: No rash  Cardiovascular: No chest pain Respiratory: +SOB Gastrointestinal: See HPI and otherwise negative Genitourinary: No dysuria Neurological: No headache, dizziness or syncope Musculoskeletal: No new muscle or joint pain Hematologic: No bleeding Psychiatric: No history of depression or anxiety    Physical Exam:  Vital signs in last 24 hours: Temp:  [98.6 F (37 C)-100.5 F (38.1 C)] 99.8 F (37.7 C) (10/09 1211) Pulse Rate:  [113-127] 117 (10/09 1211) Resp:  [36-40] 40 (10/09 1211) BP: (106-137)/(61-65) 115/61 (10/09 1211) SpO2:  [91 %-99 %] 96 % (10/09 1211) Last BM Date:  (no value) General:   Pleasant AA male appears to be in NAD, Well developed, Well nourished, alert and cooperative Head:  Normocephalic and atraumatic. Eyes:   PEERL, EOMI. No icterus. Conjunctiva pink. Ears:  Normal auditory acuity. Neck:  Supple Throat: Oral cavity and pharynx without inflammation, swelling or lesion. Teeth in good condition. Lungs: Respirations even and unlabored. +b/l wheezes Heart: Normal S1, S2. No MRG. Regular rate and rhythm. No peripheral edema, cyanosis or pallor.  Abdomen:  Soft, nondistended, nontender. No rebound or guarding. Normal bowel sounds. No appreciable masses or hepatomegaly. Rectal:  Not performed.  Msk:  Symmetrical without gross deformities. Peripheral pulses intact.  Extremities:  Without edema, no deformity or joint abnormality.  Neurologic:  Alert and  oriented x4;  Unable to provide much history Skin:   Dry and intact without significant lesions or rashes. Psychiatric: Demonstrates good judgement and reason  without abnormal affect or behaviors.+flat affect    LAB RESULTS: Recent Labs    02/11/2020 2130 02/13/20 0333 02/14/20 0355  WBC 4.7 3.2* 4.3  HGB 9.8* 9.3* 9.7*  HCT 30.0* 28.6* 29.9*  PLT 267 252 291   BMET Recent Labs    02/21/2020 1137 03/05/2020 1137 03/05/2020 2130 02/13/20 0333 02/14/20 0355  NA 132*  --   --  134* 134*  K 3.4*  --   --  3.6 3.5  CL 99  --   --  104 101  CO2 22  --   --  21* 23  GLUCOSE 97  --   --  112* 132*  BUN 9  --   --  7* <5*  CREATININE 0.92   < > 0.85 0.78 0.76  CALCIUM 8.3*  --   --  7.9* 8.1*   < > = values in this interval not displayed.   LFT Recent Labs    02/14/20 0355  PROT 5.9*  ALBUMIN 2.6*  AST 33  ALT 20  ALKPHOS 33*  BILITOT 0.8   STUDIES: DG Abd 1 View  Result Date: 02/10/2020 CLINICAL DATA:  Decreased appetite EXAM: ABDOMEN - 1 VIEW COMPARISON:  February 07, 2020 FINDINGS: Relatively mild volume of stool. Postoperative change in lower abdomen. No bowel dilatation or air-fluid level to suggest bowel obstruction. No free air. Vascular calcifications noted in the pelvis. Visualized lung bases clear. IMPRESSION: No bowel obstruction or free air on supine examination. Postoperative changes noted. Electronically Signed   By: Lowella Grip III M.D.   On: 02/29/2020 14:24     Impression / Plan:   Impression: 1.  Intractable nausea/vomiting (regurgitation?)  With dysphagia: Over the past month per family, speech pathology with question of esophageal origin, history of esophageal stricture last dilated in 2018 that due to past radiation in this area; consider stricture versus other 2.  Right lower lobe pneumonia: Thought due to aspiration 3.  Generalized weakness 4.  Recurrent non-small cell lung cancer: Currently receiving chemo with oncology 5.  Normocytic anemia 6.  History of schizophrenia  Plan: 1.  Plan will be for an EGD.  Did discuss risks, benefits, limitations and alternatives with the patient as well as his sister  is POA and they agreed to proceed. 2.  This will likely be scheduled for tomorrow with Dr. Havery Moros. 3.  For now patient to remain on current diet and n.p.o. after midnight 4.  Please await any further recommendations from Dr. Havery Moros later today  Thank you for your kind consultation, we will continue to follow.  Lavone Nian Timouthy Gilardi  02/14/2020, 12:14 PM

## 2020-02-15 ENCOUNTER — Inpatient Hospital Stay (HOSPITAL_COMMUNITY): Payer: Medicare Other

## 2020-02-15 DIAGNOSIS — E876 Hypokalemia: Secondary | ICD-10-CM | POA: Diagnosis not present

## 2020-02-15 DIAGNOSIS — R1319 Other dysphagia: Secondary | ICD-10-CM | POA: Diagnosis not present

## 2020-02-15 DIAGNOSIS — J189 Pneumonia, unspecified organism: Secondary | ICD-10-CM | POA: Diagnosis not present

## 2020-02-15 DIAGNOSIS — E871 Hypo-osmolality and hyponatremia: Secondary | ICD-10-CM | POA: Diagnosis not present

## 2020-02-15 DIAGNOSIS — T17908S Unspecified foreign body in respiratory tract, part unspecified causing other injury, sequela: Secondary | ICD-10-CM

## 2020-02-15 DIAGNOSIS — R112 Nausea with vomiting, unspecified: Secondary | ICD-10-CM | POA: Diagnosis not present

## 2020-02-15 LAB — COMPREHENSIVE METABOLIC PANEL
ALT: 19 U/L (ref 0–44)
AST: 35 U/L (ref 15–41)
Albumin: 2.5 g/dL — ABNORMAL LOW (ref 3.5–5.0)
Alkaline Phosphatase: 33 U/L — ABNORMAL LOW (ref 38–126)
Anion gap: 10 (ref 5–15)
BUN: <5 mg/dL — ABNORMAL LOW (ref 8–23)
CO2: 26 mmol/L (ref 22–32)
Calcium: 8.3 mg/dL — ABNORMAL LOW (ref 8.9–10.3)
Chloride: 97 mmol/L — ABNORMAL LOW (ref 98–111)
Creatinine, Ser: 0.85 mg/dL (ref 0.61–1.24)
GFR, Estimated: >60 mL/min (ref >60)
Glucose, Bld: 116 mg/dL — ABNORMAL HIGH (ref 70–99)
Potassium: 3.2 mmol/L — ABNORMAL LOW (ref 3.5–5.1)
Sodium: 133 mmol/L — ABNORMAL LOW (ref 135–145)
Total Bilirubin: 0.8 mg/dL (ref 0.3–1.2)
Total Protein: 5.9 g/dL — ABNORMAL LOW (ref 6.5–8.1)

## 2020-02-15 LAB — LEGIONELLA PNEUMOPHILA SEROGP 1 UR AG: L. pneumophila Serogp 1 Ur Ag: NEGATIVE

## 2020-02-15 LAB — CBC
HCT: 30 % — ABNORMAL LOW (ref 39.0–52.0)
Hemoglobin: 9.7 g/dL — ABNORMAL LOW (ref 13.0–17.0)
MCH: 28.2 pg (ref 26.0–34.0)
MCHC: 32.3 g/dL (ref 30.0–36.0)
MCV: 87.2 fL (ref 80.0–100.0)
Platelets: 285 10*3/uL (ref 150–400)
RBC: 3.44 MIL/uL — ABNORMAL LOW (ref 4.22–5.81)
RDW: 15.9 % — ABNORMAL HIGH (ref 11.5–15.5)
WBC: 4.7 10*3/uL (ref 4.0–10.5)
nRBC: 0 % (ref 0.0–0.2)

## 2020-02-15 MED ORDER — POTASSIUM CHLORIDE 10 MEQ/100ML IV SOLN
10.0000 meq | INTRAVENOUS | Status: AC
  Administered 2020-02-15 (×6): 10 meq via INTRAVENOUS
  Filled 2020-02-15 (×6): qty 100

## 2020-02-15 MED ORDER — FUROSEMIDE 10 MG/ML IJ SOLN
40.0000 mg | Freq: Once | INTRAMUSCULAR | Status: AC
  Administered 2020-02-15: 40 mg via INTRAVENOUS
  Filled 2020-02-15: qty 4

## 2020-02-15 MED ORDER — LIDOCAINE HCL 1 % IJ SOLN
INTRAMUSCULAR | Status: AC
  Filled 2020-02-15: qty 20

## 2020-02-15 NOTE — Progress Notes (Signed)
Progress Note   Subjective  Chief Complaint: Dysphagia  Today, patient found laying comfortably in bed.  He was found to have worsening pulmonary effusions overnight and EGD was canceled for today, now s/p thoracentesis-no fluid could be aspirated.  There is concern that he possibly aspirated again yesterday during dinner.    Objective   Vital signs in last 24 hours: Temp:  [97.6 F (36.4 C)-99.5 F (37.5 C)] 99.5 F (37.5 C) (10/10 1342) Pulse Rate:  [109-121] 121 (10/10 1342) Resp:  [17-34] 25 (10/10 1342) BP: (109-121)/(55-70) 119/65 (10/10 1342) SpO2:  [91 %-98 %] 91 % (10/10 1342) Last BM Date:  (unknown) General:    Ill-appearing AA male in NAD Heart:  Regular rate and rhythm; no murmurs Lungs: Respirations even and unlabored, lungs CTA bilaterally Abdomen:  Soft, nontender and nondistended. Normal bowel sounds. Extremities:  Without edema. Neurologic:  Alert and oriented,  grossly normal neurologically.  Intake/Output from previous day: 10/09 0701 - 10/10 0700 In: 470 [P.O.:120; IV Piggyback:350] Out: 2100 [Urine:2100] Intake/Output this shift: Total I/O In: 0  Out: 2700 [Urine:2700]  Lab Results: Recent Labs    02/13/20 0333 02/14/20 0355 02/15/20 0400  WBC 3.2* 4.3 4.7  HGB 9.3* 9.7* 9.7*  HCT 28.6* 29.9* 30.0*  PLT 252 291 285   BMET Recent Labs    02/13/20 0333 02/14/20 0355 02/15/20 0400  NA 134* 134* 133*  K 3.6 3.5 3.2*  CL 104 101 97*  CO2 21* 23 26  GLUCOSE 112* 132* 116*  BUN 7* <5* <5*  CREATININE 0.78 0.76 0.85  CALCIUM 7.9* 8.1* 8.3*   LFT Recent Labs    02/15/20 0400  PROT 5.9*  ALBUMIN 2.5*  AST 35  ALT 19  ALKPHOS 33*  BILITOT 0.8   Studies/Results: DG Chest 1 View  Result Date: 02/15/2020 CLINICAL DATA:  Status post thoracentesis. EXAM: CHEST  1 VIEW COMPARISON:  Chest CT 02/14/2020; chest radiograph 02/14/2020 FINDINGS: Stabled Port-A-Cath. Similar-appearing masslike consolidation right upper hemithorax. Mild  interval decrease in size of moderate loculated right pleural effusion. Similar bibasilar heterogeneous opacities. No definite pneumothorax. IMPRESSION: Mild interval decrease in size of moderate loculated right pleural effusion. No pneumothorax. Similar bilateral lower lung airspace opacities. Electronically Signed   By: Lovey Newcomer M.D.   On: 02/15/2020 13:39   CT CHEST W CONTRAST  Result Date: 02/14/2020 CLINICAL DATA:  Progressive consolidation despite antibiotic therapy, lung cancer, pneumonia EXAM: CT CHEST WITH CONTRAST TECHNIQUE: Multidetector CT imaging of the chest was performed during intravenous contrast administration. CONTRAST:  8mL OMNIPAQUE IOHEXOL 300 MG/ML  SOLN COMPARISON:  12/30/2019, chest radiographs 03/05/2020 and 02/14/2020 FINDINGS: Cardiovascular: Post radiation changes are noted involving the right upper lobe with attenuation of the right upper lobe pulmonary arterial vasculature. The central pulmonary arteries are of normal caliber and are otherwise unremarkable. Moderate coronary artery calcification. Global cardiac size within normal limits. No pericardial effusion. The thoracic aorta is of normal caliber. Minimal atherosclerotic calcification is seen within the descending thoracic aorta and proximal arch vasculature. Right internal jugular chest port is seen with its tip within the right atrium. Mediastinum/Nodes: Thyroid unremarkable. No pathologic thoracic adenopathy. Esophagus unremarkable. Lungs/Pleura: Post radiation changes are noted within the right upper lobe with dense consolidation again noted. Additionally, there is more nodular soft tissue noted within the central right middle lobe, best appreciated on axial image # 53/2, which appears stable since prior examination and is suspicious for residual disease. There are, however, superimposed airspace infiltrates noted  within the peripheral right apex as well as within the lingula and within the lateral segment of the left  lower lobe in keeping with multifocal pneumonic infiltrate, new since prior examination. Additionally, there has developed moderate bilateral pleural effusions, right greater than left. Multiple apical blebs are seen bilaterally. There is no pneumothorax. There is debris within the right upper lobe bronchus, best appreciated on axial image # 45/5. No central obstructing mass, however, is identified. Upper Abdomen: Multiple cysts are seen scattered throughout the liver. Limited images of the upper abdomen are otherwise unremarkable. Musculoskeletal: No acute bone abnormality. IMPRESSION: Multifocal pulmonary infiltrates in keeping with atypical infection in the appropriate clinical setting. Post radiation changes within the right upper lobe with more nodular soft tissue again identified within the right middle lobe centrally suspicious for residual disease. This likely represents the residual mass noted on prior PET CT examination of 10/03/2019. There is, however, no central obstructing mass and the central airways are patent, though debris-filled within the right upper lobe. Moderate bilateral pleural effusions, right greater than left. Aortic Atherosclerosis (ICD10-I70.0). Electronically Signed   By: Fidela Salisbury MD   On: 02/14/2020 19:27   DG CHEST PORT 1 VIEW  Result Date: 02/14/2020 CLINICAL DATA:  Patient with history of lung cancer. Nausea and vomiting. EXAM: PORTABLE CHEST 1 VIEW COMPARISON:  Chest radiograph 02/06/2020. FINDINGS: Right anterior chest wall Port-A-Cath is present with tip projecting over the superior vena cava. Stable cardiac and mediastinal contours. Persistent masslike opacity right upper hemithorax. Slight interval increase in moderate right pleural effusion. Increased bilateral mid lower lung airspace opacities. IMPRESSION: 1. Interval increase in size of moderate right pleural effusion. 2. Increasing bilateral mid and lower lung airspace opacities which may represent infection in  the appropriate clinical setting. Electronically Signed   By: Lovey Newcomer M.D.   On: 02/14/2020 15:26   ECHOCARDIOGRAM COMPLETE  Result Date: 02/14/2020    ECHOCARDIOGRAM REPORT   Patient Name:   Ivan Daniels Date of Exam: 02/14/2020 Medical Rec #:  [id number]       Height:       71.0 in Accession #:    [id number]      Weight:       147.0 lb Date of Birth:  July 17, 1945      BSA:          1.850 m Patient Age:    74 years        BP:           115/61 mmHg Patient Gender: M               HR:           120 bpm. Exam Location:  Inpatient Procedure: 2D Echo Indications:    428.21 CHF  History:        Patient has no prior history of Echocardiogram examinations.                 Risk Factors:Dyslipidemia and Former Smoker. ETOH Abuse.                 emphysema of lung. lung cancer. hx of pneumothorax (2016).                 tobacco abuse hx.  Sonographer:    Jannett Celestine RDCS (AE) Referring Phys: [id number] STEPHEN K CHIU  Sonographer Comments: Technically difficult study due to poor echo windows, no parasternal window and no apical window. Image acquisition challenging due to respiratory motion.  IMPRESSIONS  1. Technically difficult echo with very poor image quality.  2. Left ventricular ejection fraction, by estimation, is 55 to 60%. The left ventricle has normal function. The left ventricle has no regional wall motion abnormalities. There is not well visualized left ventricular hypertrophy. Left ventricular diastolic function could not be evaluated.  3. Right ventricular systolic function was not well visualized. The right ventricular size is not well visualized.  4. A small pericardial effusion is present.  5. The mitral valve was not well visualized. No evidence of mitral valve regurgitation.  6. The aortic valve was not well visualized. Aortic valve regurgitation is not visualized. FINDINGS  Left Ventricle: Left ventricular ejection fraction, by estimation, is 55 to 60%. The left ventricle has normal function. The left  ventricle has no regional wall motion abnormalities. The left ventricular internal cavity size was normal in size. There is  not well visualized left ventricular hypertrophy. Left ventricular diastolic function could not be evaluated. Right Ventricle: The right ventricular size is not well visualized. Right vetricular wall thickness was not well visualized. Right ventricular systolic function was not well visualized. Left Atrium: Left atrial size was not well visualized. Right Atrium: Right atrial size was not well visualized. Pericardium: A small pericardial effusion is present. Mitral Valve: The mitral valve was not well visualized. No evidence of mitral valve regurgitation. Tricuspid Valve: The tricuspid valve is not well visualized. Tricuspid valve regurgitation is not demonstrated. Aortic Valve: The aortic valve was not well visualized. Aortic valve regurgitation is not visualized. Pulmonic Valve: The pulmonic valve was not well visualized. Pulmonic valve regurgitation is not visualized. Aorta: The aortic root was not well visualized. IAS/Shunts: The interatrial septum was not well visualized. Additional Comments: Technically difficult echo with very poor image quality. Mertie Moores MD Electronically signed by Mertie Moores MD Signature Date/Time: 02/14/2020/4:20:53 PM    Final        Assessment / Plan:   Assessment: 1.  Intractable nausea/vomiting (regurgitation?)  With dysphagia: Over the past month per family, speech pathology with question of esophageal origin, history of esophageal stricture last dilated in 2018 2.  Right lower lobe pneumonia 3.  Generalized weakness 4.  Recurrent non-small cell lung cancer 5.  Normocytic anemia 6.  History of schizophrenia  Plan: 1.  EGD had to be postponed today due to worsening pleural effusions overnight/declining respiratory status-now s/p thoracentesis.  We are hopeful that we can reschedule this for tomorrow, but this will depend be dependent upon the  patient's status in the morning. 2.  For now continue n.p.o. 3.  Please await further recommendations from Dr. Havery Moros later today  Thank you for kind consultation, we will continue to follow.    LOS: 3 days   Levin Erp  02/15/2020, 2:44 PM

## 2020-02-15 NOTE — Progress Notes (Signed)
PROGRESS NOTE    Ivan Daniels  AUQ:[id number] DOB: 25-Feb-1946 DOA: 02/22/2020 PCP: [name], FNP    Brief Narrative:   74 y.o. male with medical history significant of non-small cell lung CA. Presenting with 2 weeks of nausea, vomiting, and generalized weakness. Hx is per sister at bedside. She notes that he was in his normal state of health until about 2 weeks ago. At that time, she notes that he complained about nausea and was unable to keep heavy food down. This progressed to problems w/ holding down liquids and his medications. She denies he had any complaints of ab pain, CP, or any other symptoms. She noted that he was becoming progressively weak and required more and more help with daily activities. She tried seeing his regular doctors, but was unable to get relief. She decided this morning it was time to come to the ED. She reports no other aggravating or alleviating factors. She reports no other treatments given for these symptoms. Of note, the patient has chemotherapy ongoing for his NSCLC.   ED Course: CXR was positive for RLL PNA. He was noted to be tachycardic and tachypneic. TRH was called for admission.    Assessment & Plan:   Active Problems:   Intractable nausea and vomiting   Aspiration into airway  RLL PNA, suspect aspiration pneumonia -witnessed choking and likely aspiration while at dinner with family at onset of symtpoms - Pt is continued on azithro, rocephin   - Currently on [facility] with continued tachypnea with RR in the 30-40's even though pt denies feeling sob -Appreciate SLP eval. Pt was initially cleared for dysphagia 3 with thin liquids. There was concern for possible recurrent esophageal stricture. Pt is s/p EGD dilation in 2018 -Will keep NPO for now, GI consulted, appreciate assistance. Recommendation for EGD when respiratory function improves -CT chest ordered and reviewed. Finding suggestive of atypical PNA and moderate bilateral effusions. Of  note, RUL is debris-filled  -Attempted US guided thoracentesis x2 tries without success. Consulted CT surgery who has requested transfer to [facility] for further eval -Will repeat CBC and CMP in AM  Intractable N/V -Seems stable this AM without recurrence thus far -abd xray personally reviewed by myself. Findings of non-obstructive bowel gas noted -Question if related to current chemo for lung cancer vs symptomatic esophageal stricture -GI following, planning EGD when respiratory function improves  Anorexia -abd xray per above -continue with anti-emetics as needed - Holding IVF for now given lasix given above  Generalized weakness     - PT/OT consulted  - Therapy recs for HHPT noted  Recurrent NSCLC S3a     - receiving chemo w/ oncology (Dr. Earlie Server) -See above, pt underwent last chemo on 9/16  Hypokalemia Hyponatremia (mild) -Potassium low at 3.2 today. Will replace -Repeat bmet in AM  Normocytic anemia Hx of iron deficiency anemia - no evidence of bleed - Hgb seems to be near baseline at this time -Repeat cbc in AM  Hx of schizophrenia - on haldol prior to admit -PO meds held secondary to concerns of aspiration  DVT prophylaxis: Lovenox subq Code Status: DNR Family Communication: Pt in room, family is at bedside  Status is: Inpatient  Remains inpatient appropriate because:Unsafe d/c plan, IV treatments appropriate due to intensity of illness or inability to take PO and Inpatient level of care appropriate due to severity of illness   Dispo: The patient is from: Home  Anticipated d/c is to: SNF              Anticipated d/c date is: > 3 days              Patient currently is not medically stable to d/c.   Consultants:   GI  CT Surgery  IR  Procedures:   Attempted US guided thoracentesis 10/10  Antimicrobials: Anti-infectives (From admission, onward)   Start     Dose/Rate Route Frequency Ordered Stop   02/13/20 1000  cefTRIAXone (ROCEPHIN)  2 g in sodium chloride 0.9 % 100 mL IVPB        2 g 200 mL/hr over 30 Minutes Intravenous Every 24 hours 03/03/2020 2050 02/17/20 0959   02/13/20 1000  azithromycin (ZITHROMAX) 500 mg in sodium chloride 0.9 % 250 mL IVPB        500 mg 250 mL/hr over 60 Minutes Intravenous Every 24 hours 02/17/2020 2050 02/17/20 0959   03/05/2020 1230  cefTRIAXone (ROCEPHIN) 1 g in sodium chloride 0.9 % 100 mL IVPB        1 g 200 mL/hr over 30 Minutes Intravenous  Once 02/27/2020 1222 03/03/2020 1535   02/07/2020 1230  azithromycin (ZITHROMAX) 500 mg in sodium chloride 0.9 % 250 mL IVPB        500 mg 250 mL/hr over 60 Minutes Intravenous  Once 02/10/2020 1222 03/06/2020 1851      Subjective: Without complaints. Denies sob  Objective: Vitals:   02/15/20 0751 02/15/20 0956 02/15/20 1002 02/15/20 1342  BP:  121/65  119/65  Pulse:  (!) 117  (!) 121  Resp:  (!) 33 (!) 28 (!) 25  Temp:  98.6 F (37 C)  99.5 F (37.5 C)  TempSrc:  Axillary  Oral  SpO2: 98% 95%  91%  Weight:      Height:        Intake/Output Summary (Last 24 hours) at 02/15/2020 1348 Last data filed at 02/15/2020 1343 Gross per 24 hour  Intake 350 ml  Output 4550 ml  Net -4200 ml   Filed Weights   02/11/2020 2050  Weight: 66.7 kg    Examination: General exam: Awake, laying in bed, in nad Respiratory system: Increased resp effort, tachypnea, no wheezing Cardiovascular system: tachycardia, s1, s2 Gastrointestinal system: Soft, nondistended, positive BS Central nervous system: CN2-12 grossly intact, strength intact Extremities: Perfused, no clubbing Skin: Normal skin turgor, no notable skin lesions seen Psychiatry: Mood seems normal // affect seems normal  Data Reviewed: I have personally reviewed following labs and imaging studies  CBC: Recent Labs  Lab 02/11/20 1425 02/11/20 1425 02/10/2020 1137 02/11/2020 2130 02/13/20 0333 02/14/20 0355 02/15/20 0400  WBC 4.0  --  4.9 4.7 3.2* 4.3 4.7  NEUTROABS 2.1  --   --   --   --   --   --    HGB 10.2*  --  10.1* 9.8* 9.3* 9.7* 9.7*  HCT 31.1*   < > 31.6* 30.0* 28.6* 29.9* 30.0*  MCV 84.7   < > 88.3 87.7 87.7 87.7 87.2  PLT 286  --  312 267 252 291 285   < > = values in this interval not displayed.   Basic Metabolic Panel: Recent Labs  Lab 02/11/20 1425 02/08/2020 1137 02/22/2020 2130 02/13/20 0333 02/14/20 0355 02/15/20 0400  NA 133* 132*  --  134* 134* 133*  K 3.4* 3.4*  --  3.6 3.5 3.2*  CL 97* 99  --  104 101 97*  CO2 27 22  --  21* 23 26  GLUCOSE 90 97  --  112* 132* 116*  BUN 9 9  --  7* <5* <5*  CREATININE 0.83 0.92 0.85 0.78 0.76 0.85  CALCIUM 9.4 8.3*  --  7.9* 8.1* 8.3*  MG  --   --  1.9  --   --   --    GFR: Estimated Creatinine Clearance: 74.1 mL/min (by C-G formula based on SCr of 0.85 mg/dL). Liver Function Tests: Recent Labs  Lab 02/11/20 1425 02/13/20 0333 02/14/20 0355 02/15/20 0400  AST 43* 33 33 35  ALT 27 22 20 19   ALKPHOS 41 30* 33* 33*  BILITOT 0.6 1.0 0.8 0.8  PROT 6.9 5.8* 5.9* 5.9*  ALBUMIN 2.8* 2.6* 2.6* 2.5*   No results for input(s): LIPASE, AMYLASE in the last 168 hours. No results for input(s): AMMONIA in the last 168 hours. Coagulation Profile: No results for input(s): INR, PROTIME in the last 168 hours. Cardiac Enzymes: No results for input(s): CKTOTAL, CKMB, CKMBINDEX, TROPONINI in the last 168 hours. BNP (last 3 results) No results for input(s): PROBNP in the last 8760 hours. HbA1C: No results for input(s): HGBA1C in the last 72 hours. CBG: Recent Labs  Lab 03/04/2020 1051  GLUCAP 82   Lipid Profile: No results for input(s): CHOL, HDL, LDLCALC, TRIG, CHOLHDL, LDLDIRECT in the last 72 hours. Thyroid Function Tests: No results for input(s): TSH, T4TOTAL, FREET4, T3FREE, THYROIDAB in the last 72 hours. Anemia Panel: No results for input(s): VITAMINB12, FOLATE, FERRITIN, TIBC, IRON, RETICCTPCT in the last 72 hours. Sepsis Labs: Recent Labs  Lab 02/08/2020 1240  LATICACIDVEN 1.0    Recent Results (from the past  240 hour(s))  Respiratory Panel by RT PCR (Flu A&B, Covid) - Nasopharyngeal Swab     Status: None   Collection Time: 02/07/20 10:10 AM   Specimen: Nasopharyngeal Swab  Result Value Ref Range Status   SARS Coronavirus 2 by RT PCR NEGATIVE NEGATIVE Final    Comment: (NOTE) SARS-CoV-2 target nucleic acids are NOT DETECTED.  The SARS-CoV-2 RNA is generally detectable in upper respiratoy specimens during the acute phase of infection. The lowest concentration of SARS-CoV-2 viral copies this assay can detect is 131 copies/mL. A negative result does not preclude SARS-Cov-2 infection and should not be used as the sole basis for treatment or other patient management decisions. A negative result may occur with  improper specimen collection/handling, submission of specimen other than nasopharyngeal swab, presence of viral mutation(s) within the areas targeted by this assay, and inadequate number of viral copies (<131 copies/mL). A negative result must be combined with clinical observations, patient history, and epidemiological information. The expected result is Negative.  Fact Sheet for Patients:  PinkCheek.be  Fact Sheet for Healthcare Providers:  GravelBags.it  This test is no t yet approved or cleared by the [place] FDA and  has been authorized for detection and/or diagnosis of SARS-CoV-2 by FDA under an Emergency Use Authorization (EUA). This EUA will remain  in effect (meaning this test can be used) for the duration of the COVID-19 declaration under Section 564(b)(1) of the Act, 21 U.S.C. section 360bbb-3(b)(1), unless the authorization is terminated or revoked sooner.     Influenza A by PCR NEGATIVE NEGATIVE Final   Influenza B by PCR NEGATIVE NEGATIVE Final    Comment: (NOTE) The Xpert Xpress SARS-CoV-2/FLU/RSV assay is intended as an aid in  the diagnosis of influenza from Nasopharyngeal swab specimens and  should  not  be used as a sole basis for treatment. Nasal washings and  aspirates are unacceptable for Xpert Xpress SARS-CoV-2/FLU/RSV  testing.  Fact Sheet for Patients: PinkCheek.be  Fact Sheet for Healthcare Providers: GravelBags.it  This test is not yet approved or cleared by the [place] FDA and  has been authorized for detection and/or diagnosis of SARS-CoV-2 by  FDA under an Emergency Use Authorization (EUA). This EUA will remain  in effect (meaning this test can be used) for the duration of the  Covid-19 declaration under Section 564(b)(1) of the Act, 21  U.S.C. section 360bbb-3(b)(1), unless the authorization is  terminated or revoked. Performed at [facility], [place] [address], [place], [place] [address]   Blood culture (routine x 2)     Status: None (Preliminary result)   Collection Time: 02/15/2020 12:40 PM   Specimen: BLOOD  Result Value Ref Range Status   Specimen Description   Final    BLOOD LEFT ANTECUBITAL Performed at [place] [address], [place], [place] [address]    Special Requests   Final    BOTTLES DRAWN AEROBIC AND ANAEROBIC Blood Culture adequate volume Performed at [place] [address] [place], [place] [address]    Culture   Final    NO GROWTH 2 DAYS Performed at [place] [address], [place], [place] [address]    Report Status PENDING  Incomplete  Blood culture (routine x 2)     Status: None (Preliminary result)   Collection Time: 02/22/2020 12:50 PM   Specimen: BLOOD  Result Value Ref Range Status   Specimen Description   Final    BLOOD RIGHT ANTECUBITAL Performed at [place] [address], [place], [place] [address]    Special Requests   Final    BOTTLES DRAWN AEROBIC AND ANAEROBIC Blood Culture adequate volume   Culture   Final    NO GROWTH 2 DAYS Performed at [place], [place] [address], [place], [place] [address]    Report Status PENDING  Incomplete  Respiratory Panel by RT PCR (Flu A&B, Covid) - Nasopharyngeal Swab     Status: None   Collection Time: 02/27/2020 12:55 PM   Specimen: Nasopharyngeal Swab  Result Value Ref Range Status   SARS Coronavirus 2 by RT PCR NEGATIVE NEGATIVE Final    Comment: (NOTE) SARS-CoV-2 target nucleic acids are NOT DETECTED.  The SARS-CoV-2 RNA is generally detectable in upper respiratoy specimens during the acute phase of infection. The lowest concentration of SARS-CoV-2 viral copies this assay can detect is 131 copies/mL. A negative result does not preclude SARS-Cov-2 infection and should not be used as the sole basis for treatment or other patient management decisions. A negative result may occur with  improper specimen collection/handling, submission of specimen other than nasopharyngeal swab, presence of viral mutation(s) within the areas targeted by this assay, and inadequate number of viral copies (<131 copies/mL). A negative result must be combined with clinical observations, patient history, and epidemiological information. The expected result is Negative.  Fact Sheet for Patients:  PinkCheek.be  Fact Sheet for Healthcare Providers:  GravelBags.it  This test is no t yet approved or cleared by the [place] FDA and  has been authorized for detection and/or diagnosis of SARS-CoV-2 by FDA under an Emergency Use Authorization (EUA). This EUA will remain  in effect (meaning this test can be used) for the duration of the COVID-19 declaration under Section 564(b)(1)  of the Act, 21 U.S.C. section 360bbb-3(b)(1), unless the authorization is terminated or revoked sooner.     Influenza A by PCR NEGATIVE NEGATIVE Final   Influenza B by PCR NEGATIVE NEGATIVE Final    Comment: (NOTE) The Xpert Xpress SARS-CoV-2/FLU/RSV assay is intended as an aid in  the  diagnosis of influenza from Nasopharyngeal swab specimens and  should not be used as a sole basis for treatment. Nasal washings and  aspirates are unacceptable for Xpert Xpress SARS-CoV-2/FLU/RSV  testing.  Fact Sheet for Patients: PinkCheek.be  Fact Sheet for Healthcare Providers: GravelBags.it  This test is not yet approved or cleared by the [place] FDA and  has been authorized for detection and/or diagnosis of SARS-CoV-2 by  FDA under an Emergency Use Authorization (EUA). This EUA will remain  in effect (meaning this test can be used) for the duration of the  Covid-19 declaration under Section 564(b)(1) of the Act, 21  U.S.C. section 360bbb-3(b)(1), unless the authorization is  terminated or revoked. Performed at [facility], [place] [address] [place], [place] [address]   MRSA PCR Screening     Status: None   Collection Time: 02/14/20 10:27 AM   Specimen: Nasopharyngeal  Result Value Ref Range Status   MRSA by PCR NEGATIVE NEGATIVE Final    Comment:        The GeneXpert MRSA Assay (FDA approved for NASAL specimens only), is one component of a comprehensive MRSA colonization surveillance program. It is not intended to diagnose MRSA infection nor to guide or monitor treatment for MRSA infections. Performed at [facility], [place] [address] [place], [place] [address]      Radiology Studies: DG Chest 1 View  Result Date: 02/15/2020 CLINICAL DATA:  Status post thoracentesis. EXAM: CHEST  1 VIEW COMPARISON:  Chest CT 02/14/2020; chest radiograph 02/14/2020 FINDINGS: Stabled Port-A-Cath. Similar-appearing masslike consolidation right upper hemithorax. Mild interval decrease in size of moderate loculated right pleural effusion. Similar bibasilar heterogeneous opacities. No definite pneumothorax. IMPRESSION: Mild interval decrease in size of moderate loculated right pleural  effusion. No pneumothorax. Similar bilateral lower lung airspace opacities. Electronically Signed   By: Lovey Newcomer M.D.   On: 02/15/2020 13:39   CT CHEST W CONTRAST  Result Date: 02/14/2020 CLINICAL DATA:  Progressive consolidation despite antibiotic therapy, lung cancer, pneumonia EXAM: CT CHEST WITH CONTRAST TECHNIQUE: Multidetector CT imaging of the chest was performed during intravenous contrast administration. CONTRAST:  6mL OMNIPAQUE IOHEXOL 300 MG/ML  SOLN COMPARISON:  12/30/2019, chest radiographs 02/15/2020 and 02/14/2020 FINDINGS: Cardiovascular: Post radiation changes are noted involving the right upper lobe with attenuation of the right upper lobe pulmonary arterial vasculature. The central pulmonary arteries are of normal caliber and are otherwise unremarkable. Moderate coronary artery calcification. Global cardiac size within normal limits. No pericardial effusion. The thoracic aorta is of normal caliber. Minimal atherosclerotic calcification is seen within the descending thoracic aorta and proximal arch vasculature. Right internal jugular chest port is seen with its tip within the right atrium. Mediastinum/Nodes: Thyroid unremarkable. No pathologic thoracic adenopathy. Esophagus unremarkable. Lungs/Pleura: Post radiation changes are noted within the right upper lobe with dense consolidation again noted. Additionally, there is more nodular soft tissue noted within the central right middle lobe, best appreciated on axial image # 53/2, which appears stable since prior examination and is suspicious for residual disease. There are, however, superimposed airspace infiltrates noted within the peripheral right apex as well as within the lingula and within the lateral segment of the  left lower lobe in keeping with multifocal pneumonic infiltrate, new since prior examination. Additionally, there has developed moderate bilateral pleural effusions, right greater than left. Multiple apical blebs are seen  bilaterally. There is no pneumothorax. There is debris within the right upper lobe bronchus, best appreciated on axial image # 45/5. No central obstructing mass, however, is identified. Upper Abdomen: Multiple cysts are seen scattered throughout the liver. Limited images of the upper abdomen are otherwise unremarkable. Musculoskeletal: No acute bone abnormality. IMPRESSION: Multifocal pulmonary infiltrates in keeping with atypical infection in the appropriate clinical setting. Post radiation changes within the right upper lobe with more nodular soft tissue again identified within the right middle lobe centrally suspicious for residual disease. This likely represents the residual mass noted on prior PET CT examination of 10/03/2019. There is, however, no central obstructing mass and the central airways are patent, though debris-filled within the right upper lobe. Moderate bilateral pleural effusions, right greater than left. Aortic Atherosclerosis (ICD10-I70.0). Electronically Signed   By: Fidela Salisbury MD   On: 02/14/2020 19:27   DG CHEST PORT 1 VIEW  Result Date: 02/14/2020 CLINICAL DATA:  Patient with history of lung cancer. Nausea and vomiting. EXAM: PORTABLE CHEST 1 VIEW COMPARISON:  Chest radiograph 02/22/2020. FINDINGS: Right anterior chest wall Port-A-Cath is present with tip projecting over the superior vena cava. Stable cardiac and mediastinal contours. Persistent masslike opacity right upper hemithorax. Slight interval increase in moderate right pleural effusion. Increased bilateral mid lower lung airspace opacities. IMPRESSION: 1. Interval increase in size of moderate right pleural effusion. 2. Increasing bilateral mid and lower lung airspace opacities which may represent infection in the appropriate clinical setting. Electronically Signed   By: Lovey Newcomer M.D.   On: 02/14/2020 15:26   ECHOCARDIOGRAM COMPLETE  Result Date: 02/14/2020    ECHOCARDIOGRAM REPORT   Patient Name:   ELMORE HYSLOP  Date of Exam: 02/14/2020 Medical Rec #:  [id number]       Height:       71.0 in Accession #:    [id number]      Weight:       147.0 lb Date of Birth:  06-23-1945      BSA:          1.850 m Patient Age:    13 years        BP:           115/61 mmHg Patient Gender: M               HR:           120 bpm. Exam Location:  Inpatient Procedure: 2D Echo Indications:    428.21 CHF  History:        Patient has no prior history of Echocardiogram examinations.                 Risk Factors:Dyslipidemia and Former Smoker. ETOH Abuse.                 emphysema of lung. lung cancer. hx of pneumothorax (2016).                 tobacco abuse hx.  Sonographer:    Jannett Celestine RDCS (AE) Referring Phys: [id number] K Minda Faas  Sonographer Comments: Technically difficult study due to poor echo windows, no parasternal window and no apical window. Image acquisition challenging due to respiratory motion. IMPRESSIONS  1. Technically difficult echo with very poor image quality.  2. Left ventricular ejection fraction, by  estimation, is 55 to 60%. The left ventricle has normal function. The left ventricle has no regional wall motion abnormalities. There is not well visualized left ventricular hypertrophy. Left ventricular diastolic function could not be evaluated.  3. Right ventricular systolic function was not well visualized. The right ventricular size is not well visualized.  4. A small pericardial effusion is present.  5. The mitral valve was not well visualized. No evidence of mitral valve regurgitation.  6. The aortic valve was not well visualized. Aortic valve regurgitation is not visualized. FINDINGS  Left Ventricle: Left ventricular ejection fraction, by estimation, is 55 to 60%. The left ventricle has normal function. The left ventricle has no regional wall motion abnormalities. The left ventricular internal cavity size was normal in size. There is  not well visualized left ventricular hypertrophy. Left ventricular diastolic function could  not be evaluated. Right Ventricle: The right ventricular size is not well visualized. Right vetricular wall thickness was not well visualized. Right ventricular systolic function was not well visualized. Left Atrium: Left atrial size was not well visualized. Right Atrium: Right atrial size was not well visualized. Pericardium: A small pericardial effusion is present. Mitral Valve: The mitral valve was not well visualized. No evidence of mitral valve regurgitation. Tricuspid Valve: The tricuspid valve is not well visualized. Tricuspid valve regurgitation is not demonstrated. Aortic Valve: The aortic valve was not well visualized. Aortic valve regurgitation is not visualized. Pulmonic Valve: The pulmonic valve was not well visualized. Pulmonic valve regurgitation is not visualized. Aorta: The aortic root was not well visualized. IAS/Shunts: The interatrial septum was not well visualized. Additional Comments: Technically difficult echo with very poor image quality. Mertie Moores MD Electronically signed by Mertie Moores MD Signature Date/Time: 02/14/2020/4:20:53 PM    Final     Scheduled Meds: . chlorhexidine  15 mL Mouth Rinse BID  . Chlorhexidine Gluconate Cloth  6 each Topical Daily  . enoxaparin (LOVENOX) injection  40 mg Subcutaneous Q24H  . latanoprost  1 drop Both Eyes QHS  . mouth rinse  15 mL Mouth Rinse q12n4p  . pantoprazole (PROTONIX) IV  40 mg Intravenous Q12H  . umeclidinium-vilanterol  1 puff Inhalation Daily   Continuous Infusions: . sodium chloride    . azithromycin 500 mg (02/15/20 0919)  . cefTRIAXone (ROCEPHIN)  IV 2 g (02/15/20 0834)  . dextrose 5 % and 0.9% NaCl 50 mL/hr at 02/14/20 1644  . potassium chloride 10 mEq (02/15/20 1259)     LOS: 3 days   Marylu Lund, MD [facility] Pager On Amion  If 7PM-7AM, please contact night-coverage 02/15/2020, 1:48 PM

## 2020-02-15 NOTE — Progress Notes (Signed)
TCTS Consult Note  Kindly asked to review CT scan of chest on this 74 yo man after aborted attempt at thoracentesis.  CT shows loculated right fluid collection, possibly empyema, and a more free flowing effusion on the left. Suggest txfer to [facility] for definitive care; will likely need right VATS. Jamai Dolce Z. Orvan Seen, [place]

## 2020-02-15 NOTE — Progress Notes (Signed)
Patient presented to [facility] Ultrasound department for thoracentesis. Limited ultrasound imaging identified what appeared to be a pocket of fluid on the right, however, no fluid could be aspirated. Two attempts made to aspirate fluid. Imaging saved, Dr. Vernard Gambles notified. Post-procedure CXR demonstrated no pneumothorax.  Ivan Daniels, [place] [phone] 02/15/2020, 12:53 PM

## 2020-02-16 ENCOUNTER — Encounter (HOSPITAL_COMMUNITY): Admission: EM | Disposition: E | Payer: Self-pay | Source: Home / Self Care | Attending: Internal Medicine

## 2020-02-16 ENCOUNTER — Inpatient Hospital Stay (HOSPITAL_COMMUNITY): Payer: Medicare Other

## 2020-02-16 DIAGNOSIS — J869 Pyothorax without fistula: Secondary | ICD-10-CM

## 2020-02-16 DIAGNOSIS — J189 Pneumonia, unspecified organism: Secondary | ICD-10-CM | POA: Diagnosis not present

## 2020-02-16 DIAGNOSIS — E876 Hypokalemia: Secondary | ICD-10-CM | POA: Diagnosis not present

## 2020-02-16 DIAGNOSIS — T17908S Unspecified foreign body in respiratory tract, part unspecified causing other injury, sequela: Secondary | ICD-10-CM | POA: Diagnosis not present

## 2020-02-16 DIAGNOSIS — R1319 Other dysphagia: Secondary | ICD-10-CM | POA: Diagnosis not present

## 2020-02-16 LAB — COMPREHENSIVE METABOLIC PANEL
ALT: 21 U/L (ref 0–44)
AST: 34 U/L (ref 15–41)
Albumin: 2.6 g/dL — ABNORMAL LOW (ref 3.5–5.0)
Alkaline Phosphatase: 35 U/L — ABNORMAL LOW (ref 38–126)
Anion gap: 9 (ref 5–15)
BUN: 7 mg/dL — ABNORMAL LOW (ref 8–23)
CO2: 27 mmol/L (ref 22–32)
Calcium: 8.7 mg/dL — ABNORMAL LOW (ref 8.9–10.3)
Chloride: 100 mmol/L (ref 98–111)
Creatinine, Ser: 0.86 mg/dL (ref 0.61–1.24)
GFR, Estimated: >60 mL/min (ref >60)
Glucose, Bld: 125 mg/dL — ABNORMAL HIGH (ref 70–99)
Potassium: 3.4 mmol/L — ABNORMAL LOW (ref 3.5–5.1)
Sodium: 136 mmol/L (ref 135–145)
Total Bilirubin: 0.7 mg/dL (ref 0.3–1.2)
Total Protein: 6.2 g/dL — ABNORMAL LOW (ref 6.5–8.1)

## 2020-02-16 LAB — CBC
HCT: 30.8 % — ABNORMAL LOW (ref 39.0–52.0)
Hemoglobin: 10 g/dL — ABNORMAL LOW (ref 13.0–17.0)
MCH: 28.5 pg (ref 26.0–34.0)
MCHC: 32.5 g/dL (ref 30.0–36.0)
MCV: 87.7 fL (ref 80.0–100.0)
Platelets: 321 10*3/uL (ref 150–400)
RBC: 3.51 MIL/uL — ABNORMAL LOW (ref 4.22–5.81)
RDW: 15.9 % — ABNORMAL HIGH (ref 11.5–15.5)
WBC: 4.3 10*3/uL (ref 4.0–10.5)
nRBC: 0 % (ref 0.0–0.2)

## 2020-02-16 LAB — MAGNESIUM: Magnesium: 1.8 mg/dL (ref 1.7–2.4)

## 2020-02-16 SURGERY — CANCELLED PROCEDURE

## 2020-02-16 MED ORDER — LABETALOL HCL 5 MG/ML IV SOLN: 5.0000 mg | INTRAVENOUS | Status: DC | PRN

## 2020-02-16 MED ORDER — KETOROLAC TROMETHAMINE 30 MG/ML IJ SOLN
30.0000 mg | Freq: Once | INTRAMUSCULAR | Status: AC
  Administered 2020-02-16: 30 mg via INTRAVENOUS
  Filled 2020-02-16: qty 1

## 2020-02-16 MED ORDER — POTASSIUM CHLORIDE 10 MEQ/100ML IV SOLN
10.0000 meq | INTRAVENOUS | Status: AC
  Administered 2020-02-16 (×5): 10 meq via INTRAVENOUS
  Filled 2020-02-16 (×6): qty 100

## 2020-02-16 MED ORDER — PIPERACILLIN-TAZOBACTAM 3.375 G IVPB
3.3750 g | Freq: Three times a day (TID) | INTRAVENOUS | Status: DC
  Administered 2020-02-16 – 2020-02-19 (×9): 3.375 g via INTRAVENOUS
  Filled 2020-02-16 (×9): qty 50

## 2020-02-16 SURGICAL SUPPLY — 14 items

## 2020-02-16 NOTE — TOC Progression Note (Signed)
Transition of Care [facility]) - Progression Note    Patient Details  Name: JONCARLO FRIBERG MRN: [id number] Date of Birth: [birth year]  Transition [facility]) CM/SW Contact  Purcell Mouton, RN Phone Number: 02/21/2020, 10:06 AM  Clinical Narrative:    Spoke with pt's sister Tamela Oddi concerning [place]. [facility] will follow pt for Home Health needs at discharge.    Expected Discharge Plan: [place] Barriers to Discharge: No Barriers Identified  Expected Discharge Plan and Services Expected Discharge Plan: [place] arrangements for the past 2 months: Single Family Home                                       Social Determinants of Health (SDOH) Interventions    Readmission Risk Interventions No flowsheet data found.

## 2020-02-16 NOTE — Progress Notes (Signed)
Pt received from [facility] via [employer]. Oriented to self. CHG bath complete. Pt connected to tele monitor; pt tachypneic and tachycardic. TCTS notified of pt arrival. Call bell in reach. Will continue to monitor.  Arletta Bale, RN
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
## 2020-02-16 NOTE — Progress Notes (Signed)
PT Cancellation Note  Patient Details Name: Ivan Daniels MRN: [id number] DOB: [birth year]   Cancelled Treatment:    Reason Eval/Treat Not Completed: Other (comment) Pt to transfer to [facility].   Jeni Duling,KATHrine E 02/21/2020, 9:11 AM Jannette Spanner PT, DPT Acute Rehabilitation Services Pager: [phone] Office: [phone]

## 2020-02-16 NOTE — Progress Notes (Signed)
Bed approved for patient to transfer to [facility] 4E01C.  Report given to shift RN. Transport provided by [employer] .  Patient belongings taken by sister.  Patient walker and shower chair sent with [facility] per request of sister.

## 2020-02-16 NOTE — Progress Notes (Signed)
PROGRESS NOTE    Ivan Daniels  QPY:[id number] DOB: 1945/06/23 DOA: 02/11/2020 PCP: [name], FNP    Brief Narrative:   74 y.o. male with medical history significant of non-small cell lung CA. Presenting with 2 weeks of nausea, vomiting, and generalized weakness. Hx is per sister at bedside. She notes that he was in his normal state of health until about 2 weeks ago. At that time, she notes that he complained about nausea and was unable to keep heavy food down. This progressed to problems w/ holding down liquids and his medications. She denies he had any complaints of ab pain, CP, or any other symptoms. She noted that he was becoming progressively weak and required more and more help with daily activities. She tried seeing his regular doctors, but was unable to get relief. She decided this morning it was time to come to the ED. She reports no other aggravating or alleviating factors. She reports no other treatments given for these symptoms. Of note, the patient has chemotherapy ongoing for his NSCLC.   ED Course: CXR was positive for RLL PNA. He was noted to be tachycardic and tachypneic. TRH was called for admission.    Assessment & Plan:   Active Problems:   Intractable nausea and vomiting   Aspiration into airway  RLL PNA, suspect aspiration pneumonia -witnessed choking and likely aspiration while at dinner with family at onset of symtpoms - Pt is continued on azithro, rocephin   - Currently on [facility] with continued tachypnea with RR in the 30-40's even though pt denies feeling sob -Appreciate SLP eval. Pt was initially cleared for dysphagia 3 with thin liquids. There was concern for possible recurrent esophageal stricture. Pt is s/p EGD dilation in 2018 -Will keep NPO for now, GI consulted, appreciate assistance. Recommendation for EGD when respiratory function improves -CT chest ordered and reviewed. Finding suggestive of atypical PNA and moderate bilateral effusions. Of  note, RUL is debris-filled -Attempted US guided thoracentesis x2 tries without success. Consulted CT surgery who has requested transfer to [facility] for further eval -Recheck CBC and CMP in AM -Will transfer to [facility] today  Intractable N/V -Seems stable this AM without recurrence thus far -abd xray personally reviewed by myself. Findings of non-obstructive bowel gas noted -Question if related to current chemo for lung cancer vs symptomatic esophageal stricture -GI is following, planning EGD when respiratory function improves -Keeping NPO for now  Anorexia -continue with anti-emetics as needed -Continuing with gentle basal IVF hydration -Pending EGD per GI when respiratory function improves  Generalized weakness     - PT/OT consulted  - Therapy recs thus far for HHPT noted. May need re-eval when pt is closer to discharge  Recurrent NSCLC S3a     - receiving chemo w/ oncology (Dr. Earlie Server) -See above, pt underwent last chemo on 9/16  Hypokalemia Hyponatremia (mild) -Potassium low at 3.4 today. Will replace -Repeat bmet in AM  Normocytic anemia Hx of iron deficiency anemia - no evidence of bleed - Hgb seems to be near baseline at this time, currently 10.0 -Repeat cbc in AM  Hx of schizophrenia - on haldol prior to admit -PO meds held secondary to concerns of aspiration  DVT prophylaxis: Lovenox subq Code Status: DNR Family Communication: Pt in room, family is at bedside  Status is: Inpatient  Remains inpatient appropriate because:Unsafe d/c plan, IV treatments appropriate due to intensity of illness or inability to take PO and Inpatient level of care appropriate due  to severity of illness   Dispo: The patient is from: Home              Anticipated d/c is to: SNF              Anticipated d/c date is: > 3 days              Patient currently is not medically stable to d/c.   Consultants:   GI  CT Surgery  IR  Procedures:   Attempted US guided  thoracentesis 10/10  Antimicrobials: Anti-infectives (From admission, onward)   Start     Dose/Rate Route Frequency Ordered Stop   02/13/20 1000  cefTRIAXone (ROCEPHIN) 2 g in sodium chloride 0.9 % 100 mL IVPB        2 g 200 mL/hr over 30 Minutes Intravenous Every 24 hours 02/11/2020 2050 02/14/2020 1001   02/13/20 1000  azithromycin (ZITHROMAX) 500 mg in sodium chloride 0.9 % 250 mL IVPB        500 mg 250 mL/hr over 60 Minutes Intravenous Every 24 hours 02/11/2020 2050 02/09/2020 1116   02/11/2020 1230  cefTRIAXone (ROCEPHIN) 1 g in sodium chloride 0.9 % 100 mL IVPB        1 g 200 mL/hr over 30 Minutes Intravenous  Once 02/13/2020 1222 02/21/2020 1535   02/15/2020 1230  azithromycin (ZITHROMAX) 500 mg in sodium chloride 0.9 % 250 mL IVPB        500 mg 250 mL/hr over 60 Minutes Intravenous  Once 03/03/2020 1222 02/06/2020 1851      Subjective: Without complaints this AM although pt visibly appears to have tachypenea  Objective: Vitals:   02/26/2020 0841 03/02/2020 1000 02/21/2020 1103 03/03/2020 1200  BP:  126/64  124/83  Pulse:  (!) 116  (!) 119  Resp:  (!) 31  (!) 39  Temp:   98.1 F (36.7 C)   TempSrc:   Oral   SpO2: 99% 98%  98%  Weight:      Height:        Intake/Output Summary (Last 24 hours) at 02/26/2020 1441 Last data filed at 03/03/2020 1016 Gross per 24 hour  Intake 2766.77 ml  Output 775 ml  Net 1991.77 ml   Filed Weights   02/15/2020 2050  Weight: 66.7 kg    Examination: General exam: Conversant, in no acute distress Respiratory system: increased resp effort, tachypnea, clear, no audible wheezing Cardiovascular system: tachycardic, s1-s2 Gastrointestinal system: Nondistended, nontender, pos BS Central nervous system: No seizures, no tremors Extremities: No cyanosis, no joint deformities Skin: No rashes, no pallor Psychiatry: Affect normal // no auditory hallucinations   Data Reviewed: I have personally reviewed following labs and imaging studies  CBC: Recent Labs  Lab  02/11/20 1425 03/02/2020 1137 02/23/2020 2130 02/13/20 0333 02/14/20 0355 02/15/20 0400 02/23/2020 0315  WBC 4.0   < > 4.7 3.2* 4.3 4.7 4.3  NEUTROABS 2.1  --   --   --   --   --   --   HGB 10.2*   < > 9.8* 9.3* 9.7* 9.7* 10.0*  HCT 31.1*   < > 30.0* 28.6* 29.9* 30.0* 30.8*  MCV 84.7   < > 87.7 87.7 87.7 87.2 87.7  PLT 286   < > 267 252 291 285 321   < > = values in this interval not displayed.   Basic Metabolic Panel: Recent Labs  Lab 02/13/2020 1137 02/11/2020 1137 02/13/2020 2130 02/13/20 0333 02/14/20 0355 02/15/20 0400 02/11/2020 0315  NA 132*  --   --  134* 134* 133* 136  K 3.4*  --   --  3.6 3.5 3.2* 3.4*  CL 99  --   --  104 101 97* 100  CO2 22  --   --  21* 23 26 27   GLUCOSE 97  --   --  112* 132* 116* 125*  BUN 9  --   --  7* <5* <5* 7*  CREATININE 0.92   < > 0.85 0.78 0.76 0.85 0.86  CALCIUM 8.3*  --   --  7.9* 8.1* 8.3* 8.7*  MG  --   --  1.9  --   --   --  1.8   < > = values in this interval not displayed.   GFR: Estimated Creatinine Clearance: 73.2 mL/min (by C-G formula based on SCr of 0.86 mg/dL). Liver Function Tests: Recent Labs  Lab 02/11/20 1425 02/13/20 0333 02/14/20 0355 02/15/20 0400 02/15/2020 0315  AST 43* 33 33 35 34  ALT 27 22 20 19 21   ALKPHOS 41 30* 33* 33* 35*  BILITOT 0.6 1.0 0.8 0.8 0.7  PROT 6.9 5.8* 5.9* 5.9* 6.2*  ALBUMIN 2.8* 2.6* 2.6* 2.5* 2.6*   No results for input(s): LIPASE, AMYLASE in the last 168 hours. No results for input(s): AMMONIA in the last 168 hours. Coagulation Profile: No results for input(s): INR, PROTIME in the last 168 hours. Cardiac Enzymes: No results for input(s): CKTOTAL, CKMB, CKMBINDEX, TROPONINI in the last 168 hours. BNP (last 3 results) No results for input(s): PROBNP in the last 8760 hours. HbA1C: No results for input(s): HGBA1C in the last 72 hours. CBG: Recent Labs  Lab 02/11/2020 1051  GLUCAP 82   Lipid Profile: No results for input(s): CHOL, HDL, LDLCALC, TRIG, CHOLHDL, LDLDIRECT in the last 72  hours. Thyroid Function Tests: No results for input(s): TSH, T4TOTAL, FREET4, T3FREE, THYROIDAB in the last 72 hours. Anemia Panel: No results for input(s): VITAMINB12, FOLATE, FERRITIN, TIBC, IRON, RETICCTPCT in the last 72 hours. Sepsis Labs: Recent Labs  Lab 02/25/2020 1240  LATICACIDVEN 1.0    Recent Results (from the past 240 hour(s))  Respiratory Panel by RT PCR (Flu A&B, Covid) - Nasopharyngeal Swab     Status: None   Collection Time: 02/07/20 10:10 AM   Specimen: Nasopharyngeal Swab  Result Value Ref Range Status   SARS Coronavirus 2 by RT PCR NEGATIVE NEGATIVE Final    Comment: (NOTE) SARS-CoV-2 target nucleic acids are NOT DETECTED.  The SARS-CoV-2 RNA is generally detectable in upper respiratoy specimens during the acute phase of infection. The lowest concentration of SARS-CoV-2 viral copies this assay can detect is 131 copies/mL. A negative result does not preclude SARS-Cov-2 infection and should not be used as the sole basis for treatment or other patient management decisions. A negative result may occur with  improper specimen collection/handling, submission of specimen other than nasopharyngeal swab, presence of viral mutation(s) within the areas targeted by this assay, and inadequate number of viral copies (<131 copies/mL). A negative result must be combined with clinical observations, patient history, and epidemiological information. The expected result is Negative.  Fact Sheet for Patients:  PinkCheek.be  Fact Sheet for Healthcare Providers:  GravelBags.it  This test is no t yet approved or cleared by the [place] FDA and  has been authorized for detection and/or diagnosis of SARS-CoV-2 by FDA under an Emergency Use Authorization (EUA). This EUA will remain  in effect (meaning this test can be used)  for the duration of the COVID-19 declaration under Section 564(b)(1) of the Act, 21  U.S.C. section 360bbb-3(b)(1), unless the authorization is terminated or revoked sooner.     Influenza A by PCR NEGATIVE NEGATIVE Final   Influenza B by PCR NEGATIVE NEGATIVE Final    Comment: (NOTE) The Xpert Xpress SARS-CoV-2/FLU/RSV assay is intended as an aid in  the diagnosis of influenza from Nasopharyngeal swab specimens and  should not be used as a sole basis for treatment. Nasal washings and  aspirates are unacceptable for Xpert Xpress SARS-CoV-2/FLU/RSV  testing.  Fact Sheet for Patients: PinkCheek.be  Fact Sheet for Healthcare Providers: GravelBags.it  This test is not yet approved or cleared by the [place] FDA and  has been authorized for detection and/or diagnosis of SARS-CoV-2 by  FDA under an Emergency Use Authorization (EUA). This EUA will remain  in effect (meaning this test can be used) for the duration of the  Covid-19 declaration under Section 564(b)(1) of the Act, 21  U.S.C. section 360bbb-3(b)(1), unless the authorization is  terminated or revoked. Performed at [facility], [place] [address] [place], [place] [address]   Blood culture (routine x 2)     Status: None (Preliminary result)   Collection Time: 02/06/2020 12:40 PM   Specimen: BLOOD  Result Value Ref Range Status   Specimen Description   Final    BLOOD LEFT ANTECUBITAL Performed at [facility] [address] [place], [place] [address]    Special Requests   Final    BOTTLES DRAWN AEROBIC AND ANAEROBIC Blood Culture adequate volume Performed at [place] [address], [place], [place] [address]    Culture   Final    NO GROWTH 4 DAYS Performed at [facility], [place] [address] [place], [place] [address]    Report Status PENDING  Incomplete  Blood culture (routine x 2)     Status: None (Preliminary result)   Collection Time: 03/01/2020 12:50 PM   Specimen: BLOOD   Result Value Ref Range Status   Specimen Description   Final    BLOOD RIGHT ANTECUBITAL Performed at [place] [address], [place], [place] [address]    Special Requests   Final    BOTTLES DRAWN AEROBIC AND ANAEROBIC Blood Culture adequate volume   Culture   Final    NO GROWTH 4 DAYS Performed at [facility], [place] [address], [place], [place] [address]    Report Status PENDING  Incomplete  Respiratory Panel by RT PCR (Flu A&B, Covid) - Nasopharyngeal Swab     Status: None   Collection Time: 02/15/2020 12:55 PM   Specimen: Nasopharyngeal Swab  Result Value Ref Range Status   SARS Coronavirus 2 by RT PCR NEGATIVE NEGATIVE Final    Comment: (NOTE) SARS-CoV-2 target nucleic acids are NOT DETECTED.  The SARS-CoV-2 RNA is generally detectable in upper respiratoy specimens during the acute phase of infection. The lowest concentration of SARS-CoV-2 viral copies this assay can detect is 131 copies/mL. A negative result does not preclude SARS-Cov-2 infection and should not be used as the sole basis for treatment or other patient management decisions. A negative result may occur with  improper specimen collection/handling, submission of specimen other than nasopharyngeal swab, presence of viral mutation(s) within the areas targeted by this assay, and inadequate number of viral copies (<131 copies/mL). A negative result must be combined with clinical observations, patient history, and epidemiological information. The expected  result is Negative.  Fact Sheet for Patients:  PinkCheek.be  Fact Sheet for Healthcare Providers:  GravelBags.it  This test is no t yet approved or cleared by the [place] FDA and  has been authorized for detection and/or diagnosis of SARS-CoV-2 by FDA under an Emergency Use Authorization (EUA). This EUA will remain  in effect (meaning this test can be used) for the  duration of the COVID-19 declaration under Section 564(b)(1) of the Act, 21 U.S.C. section 360bbb-3(b)(1), unless the authorization is terminated or revoked sooner.     Influenza A by PCR NEGATIVE NEGATIVE Final   Influenza B by PCR NEGATIVE NEGATIVE Final    Comment: (NOTE) The Xpert Xpress SARS-CoV-2/FLU/RSV assay is intended as an aid in  the diagnosis of influenza from Nasopharyngeal swab specimens and  should not be used as a sole basis for treatment. Nasal washings and  aspirates are unacceptable for Xpert Xpress SARS-CoV-2/FLU/RSV  testing.  Fact Sheet for Patients: PinkCheek.be  Fact Sheet for Healthcare Providers: GravelBags.it  This test is not yet approved or cleared by the [place] FDA and  has been authorized for detection and/or diagnosis of SARS-CoV-2 by  FDA under an Emergency Use Authorization (EUA). This EUA will remain  in effect (meaning this test can be used) for the duration of the  Covid-19 declaration under Section 564(b)(1) of the Act, 21  U.S.C. section 360bbb-3(b)(1), unless the authorization is  terminated or revoked. Performed at [facility], [place] [address] [place], [place] [address]   MRSA PCR Screening     Status: None   Collection Time: 02/14/20 10:27 AM   Specimen: Nasopharyngeal  Result Value Ref Range Status   MRSA by PCR NEGATIVE NEGATIVE Final    Comment:        The GeneXpert MRSA Assay (FDA approved for NASAL specimens only), is one component of a comprehensive MRSA colonization surveillance program. It is not intended to diagnose MRSA infection nor to guide or monitor treatment for MRSA infections. Performed at [facility], [place] [address], [place], [place] [address]      Radiology Studies: DG Chest 1 View  Result Date: 02/15/2020 CLINICAL DATA:  Status post thoracentesis. EXAM: CHEST  1 VIEW COMPARISON:  Chest CT  02/14/2020; chest radiograph 02/14/2020 FINDINGS: Stabled Port-A-Cath. Similar-appearing masslike consolidation right upper hemithorax. Mild interval decrease in size of moderate loculated right pleural effusion. Similar bibasilar heterogeneous opacities. No definite pneumothorax. IMPRESSION: Mild interval decrease in size of moderate loculated right pleural effusion. No pneumothorax. Similar bilateral lower lung airspace opacities. Electronically Signed   By: Lovey Newcomer M.D.   On: 02/15/2020 13:39   CT CHEST W CONTRAST  Result Date: 02/14/2020 CLINICAL DATA:  Progressive consolidation despite antibiotic therapy, lung cancer, pneumonia EXAM: CT CHEST WITH CONTRAST TECHNIQUE: Multidetector CT imaging of the chest was performed during intravenous contrast administration. CONTRAST:  84mL OMNIPAQUE IOHEXOL 300 MG/ML  SOLN COMPARISON:  12/30/2019, chest radiographs 02/14/2020 and 02/14/2020 FINDINGS: Cardiovascular: Post radiation changes are noted involving the right upper lobe with attenuation of the right upper lobe pulmonary arterial vasculature. The central pulmonary arteries are of normal caliber and are otherwise unremarkable. Moderate coronary artery calcification. Global cardiac size within normal limits. No pericardial effusion. The thoracic aorta is of normal caliber. Minimal atherosclerotic calcification is seen within the descending thoracic aorta and proximal arch vasculature. Right internal jugular chest port is seen with its tip within the right atrium. Mediastinum/Nodes: Thyroid unremarkable. No pathologic thoracic adenopathy. Esophagus  unremarkable. Lungs/Pleura: Post radiation changes are noted within the right upper lobe with dense consolidation again noted. Additionally, there is more nodular soft tissue noted within the central right middle lobe, best appreciated on axial image # 53/2, which appears stable since prior examination and is suspicious for residual disease. There are, however,  superimposed airspace infiltrates noted within the peripheral right apex as well as within the lingula and within the lateral segment of the left lower lobe in keeping with multifocal pneumonic infiltrate, new since prior examination. Additionally, there has developed moderate bilateral pleural effusions, right greater than left. Multiple apical blebs are seen bilaterally. There is no pneumothorax. There is debris within the right upper lobe bronchus, best appreciated on axial image # 45/5. No central obstructing mass, however, is identified. Upper Abdomen: Multiple cysts are seen scattered throughout the liver. Limited images of the upper abdomen are otherwise unremarkable. Musculoskeletal: No acute bone abnormality. IMPRESSION: Multifocal pulmonary infiltrates in keeping with atypical infection in the appropriate clinical setting. Post radiation changes within the right upper lobe with more nodular soft tissue again identified within the right middle lobe centrally suspicious for residual disease. This likely represents the residual mass noted on prior PET CT examination of 10/03/2019. There is, however, no central obstructing mass and the central airways are patent, though debris-filled within the right upper lobe. Moderate bilateral pleural effusions, right greater than left. Aortic Atherosclerosis (ICD10-I70.0). Electronically Signed   By: Fidela Salisbury MD   On: 02/14/2020 19:27   [place] CHEST (PLEURAL EFFUSION)  Result Date: 02/15/2020 CLINICAL DATA:  74 year old male with right-sided pleural effusion. EXAM: CHEST ULTRASOUND COMPARISON:  None. FINDINGS: There is a small right pleural effusion. IMPRESSION: Small right pleural effusion. Electronically Signed   By: Anner Crete M.D.   On: 02/15/2020 17:05   ECHOCARDIOGRAM COMPLETE  Result Date: 02/14/2020    ECHOCARDIOGRAM REPORT   Patient Name:   RICKIE GANGE Date of Exam: 02/14/2020 Medical Rec #:  [id number]       Height:       71.0 in Accession #:     [id number]      Weight:       147.0 lb Date of Birth:  13-Sep-1945      BSA:          1.850 m Patient Age:    72 years        BP:           115/61 mmHg Patient Gender: M               HR:           120 bpm. Exam Location:  Inpatient Procedure: 2D Echo Indications:    428.21 CHF  History:        Patient has no prior history of Echocardiogram examinations.                 Risk Factors:Dyslipidemia and Former Smoker. ETOH Abuse.                 emphysema of lung. lung cancer. hx of pneumothorax (2016).                 tobacco abuse hx.  Sonographer:    Jannett Celestine RDCS (AE) Referring Phys: [id number] Georgeana Oertel K Tanicka Bisaillon  Sonographer Comments: Technically difficult study due to poor echo windows, no parasternal window and no apical window. Image acquisition challenging due to respiratory motion. IMPRESSIONS  1. Technically difficult echo with very poor  image quality.  2. Left ventricular ejection fraction, by estimation, is 55 to 60%. The left ventricle has normal function. The left ventricle has no regional wall motion abnormalities. There is not well visualized left ventricular hypertrophy. Left ventricular diastolic function could not be evaluated.  3. Right ventricular systolic function was not well visualized. The right ventricular size is not well visualized.  4. A small pericardial effusion is present.  5. The mitral valve was not well visualized. No evidence of mitral valve regurgitation.  6. The aortic valve was not well visualized. Aortic valve regurgitation is not visualized. FINDINGS  Left Ventricle: Left ventricular ejection fraction, by estimation, is 55 to 60%. The left ventricle has normal function. The left ventricle has no regional wall motion abnormalities. The left ventricular internal cavity size was normal in size. There is  not well visualized left ventricular hypertrophy. Left ventricular diastolic function could not be evaluated. Right Ventricle: The right ventricular size is not well visualized. Right  vetricular wall thickness was not well visualized. Right ventricular systolic function was not well visualized. Left Atrium: Left atrial size was not well visualized. Right Atrium: Right atrial size was not well visualized. Pericardium: A small pericardial effusion is present. Mitral Valve: The mitral valve was not well visualized. No evidence of mitral valve regurgitation. Tricuspid Valve: The tricuspid valve is not well visualized. Tricuspid valve regurgitation is not demonstrated. Aortic Valve: The aortic valve was not well visualized. Aortic valve regurgitation is not visualized. Pulmonic Valve: The pulmonic valve was not well visualized. Pulmonic valve regurgitation is not visualized. Aorta: The aortic root was not well visualized. IAS/Shunts: The interatrial septum was not well visualized. Additional Comments: Technically difficult echo with very poor image quality. Mertie Moores MD Electronically signed by Mertie Moores MD Signature Date/Time: 02/14/2020/4:20:53 PM    Final    DG ESOPHAGUS W SINGLE CM (SOL OR THIN BA)  Result Date: 02/10/2020 CLINICAL DATA:  Dysphagia, history of lung cancer EXAM: Suggestion of fold thickening, moderately large hiatal hernia ESOPHOGRAM/BARIUM SWALLOW TECHNIQUE: Single contrast examination was performed using  thin barium. FLUOROSCOPY TIME:  Fluoroscopy Time:  3 minutes 7 seconds Radiation Exposure Index (if provided by the fluoroscopic device): 20.2 mGy Number of Acquired Spot Images: 0 COMPARISON:  CT chest from 02/14/2020 FINDINGS: The study is very limited due to difficulty in positioning of the patient and given that the patient is not able to swallow much more than a small amount of liquid at the time even with encouragement and repeated attempts. Upon swallowing of thin barium the esophagus shows diffuse fold thickening which is not well evaluated on single contrast evaluation. There is a moderate size hiatal hernia. Motility could not be well assessed nor could  luminal caliber due to the limited volume the could be ingested in given moment during the exam. Suggestion of some narrowing of the CP segment of the esophagus though this appears smooth on the LPO projection and could not be well assessed due to difficulties in patient positioning and overlapping structures on the the RPO projection best seen on image 21 of series 4. Limited distensibility of the esophagus diffusely particularly distally is suggested with potential pulsion diverticulum along the distal esophagus as well. On the final series even with head of bed elevated there is gross reflux into the distal esophagus from the stomach and into the mid and proximal esophagus as well. IMPRESSION: 1. Limited esophageal assessment due to limited ability to ingest and a barium to distend the esophagus adequately.  2. Suggestion of fold thickening on single contrast evaluation with moderate size hiatal hernia and evidence of reflux. 3. Question of narrowing at the proximal portion of the esophagus, cricopharyngeal segment, not well assessed. Electronically Signed   By: Zetta Bills M.D.   On: 02/06/2020 13:19    Scheduled Meds: . chlorhexidine  15 mL Mouth Rinse BID  . Chlorhexidine Gluconate Cloth  6 each Topical Daily  . enoxaparin (LOVENOX) injection  40 mg Subcutaneous Q24H  . latanoprost  1 drop Both Eyes QHS  . mouth rinse  15 mL Mouth Rinse q12n4p  . pantoprazole (PROTONIX) IV  40 mg Intravenous Q12H  . umeclidinium-vilanterol  1 puff Inhalation Daily   Continuous Infusions: . sodium chloride    . dextrose 5 % and 0.9% NaCl 50 mL/hr at 02/07/2020 0725     LOS: 4 days   Marylu Lund, MD [facility] Pager On Amion  If 7PM-7AM, please contact night-coverage 03/07/2020, 2:41 PM

## 2020-02-16 NOTE — Progress Notes (Signed)
Pt remains stable and awaiting on transfer to [facility] main campus. Pt sister took pt dentures with her home. Pt home DME equipments remains at bedside in room. Report off to oncoming RN. Delia Heady RN

## 2020-02-16 NOTE — Care Management Important Message (Signed)
Important Message  Patient Details IM Letter given to the Patient Name: Ivan Daniels MRN: [id number] Date of Birth: November 16, 1945   Medicare Important Message Given:  Yes     [name] 02/23/2020, 11:32 AM

## 2020-02-16 NOTE — Progress Notes (Signed)
Progress Note  Chief Complaint:  Dysphagia.        ASSESSMENT / PLAN:     # Vomiting / dysphagia / regurgitation / poor PO intake. Hx of peptic and XRT induced esophageal stricture 2018. Also, current on chemotherapy .  --Taking anti-emetics at home per sister so in addition to dysphagia / regurgitation it dose sound like he has also been nauseated with could be medication/ chemo related. No evidence for bowel obstruction or other GI issues on CT scan, --SLP feels dysphagia is esophageal in nature.  --No esophageal abnormalities on Chest CT scan 10/9.  --He has an esophagram ordered for tomorrow.  --EGD has been on hold until stable from pulmonary standpoint but also awaiting Plavix washout. His last dose of plavix was 10/8 though he may not have actually been able to swallow / keep it down.  --If esophagram negative for strictures then consider empirically treating for candida esophagitis since he had recent chemotherapy  # RLL PNA, ;? Aspiration. CT chest suggests atypical PNA and bilateral effusions. Thoracentesis aborted yesterday ( loculated fluid). Thoracic Surgery concerned about empyema and has requested transfer to [facility]. Temp 101.4 last night. On Rocephin and Zithromax     # Non-small cell lung cancer --S/p XRT, receiving chemotherapy.       SUBJECTIVE:   No specific complaints. NPO , apparently for possible VATs     OBJECTIVE:    Scheduled inpatient medications:  . chlorhexidine  15 mL Mouth Rinse BID  . Chlorhexidine Gluconate Cloth  6 each Topical Daily  . enoxaparin (LOVENOX) injection  40 mg Subcutaneous Q24H  . latanoprost  1 drop Both Eyes QHS  . mouth rinse  15 mL Mouth Rinse q12n4p  . pantoprazole (PROTONIX) IV  40 mg Intravenous Q12H  . umeclidinium-vilanterol  1 puff Inhalation Daily   Continuous inpatient infusions:  . sodium chloride    . azithromycin Stopped (02/15/20 1019)  . dextrose 5 % and 0.9% NaCl 50 mL/hr at 03/07/2020 0725   PRN  inpatient medications: acetaminophen **OR** acetaminophen, chlorhexidine, levalbuterol, ondansetron **OR** ondansetron (ZOFRAN) IV, sodium chloride flush  Vital signs in last 24 hours: Temp:  [98.7 F (37.1 C)-101.4 F (38.6 C)] 98.7 F (37.1 C) (10/11 0350) Pulse Rate:  [113-128] 116 (10/11 0800) Resp:  [24-41] 24 (10/11 0800) BP: (107-136)/(60-72) 131/62 (10/11 0800) SpO2:  [91 %-99 %] 99 % (10/11 0841) Last BM Date:  (unknown)  Intake/Output Summary (Last 24 hours) at 02/10/2020 1012 Last data filed at 02/13/2020 0725 Gross per 24 hour  Intake 2766.77 ml  Output 2175 ml  Net 591.77 ml     Physical Exam:  . General: Alert, male in NAD . Heart:  Regular rate  No lower extremity edema . Pulmonary: Normal respiratory effort . Abdomen: Soft, nondistended, nontender. Normal bowel sounds. No masses felt. . Neurologic: Alert and oriented . Psych:  Cooperative.   Filed Weights   03/03/2020 2050  Weight: 66.7 kg    Intake/Output from previous day: 10/10 0701 - 10/11 0700 In: 2613.5 [I.V.:1679.1; IV Piggyback:934.4] Out: 3275 [Urine:3275] Intake/Output this shift: Total I/O In: 153.3 [I.V.:153.3] Out: -     Lab Results: Recent Labs    02/14/20 0355 02/15/20 0400 02/10/2020 0315  WBC 4.3 4.7 4.3  HGB 9.7* 9.7* 10.0*  HCT 29.9* 30.0* 30.8*  PLT 291 285 321   BMET Recent Labs    02/14/20 0355 02/15/20 0400 02/08/2020 0315  NA 134* 133* 136  K 3.5 3.2* 3.4*  CL 101 97* 100  CO2 23 26 27   GLUCOSE 132* 116* 125*  BUN <5* <5* 7*  CREATININE 0.76 0.85 0.86  CALCIUM 8.1* 8.3* 8.7*   LFT Recent Labs    03/04/2020 0315  PROT 6.2*  ALBUMIN 2.6*  AST 34  ALT 21  ALKPHOS 35*  BILITOT 0.7   PT/INR No results for input(s): LABPROT, INR in the last 72 hours. Hepatitis Panel No results for input(s): HEPBSAG, HCVAB, HEPAIGM, HEPBIGM in the last 72 hours.  DG Chest 1 View  Result Date: 02/15/2020 CLINICAL DATA:  Status post thoracentesis. EXAM: CHEST  1 VIEW  COMPARISON:  Chest CT 02/14/2020; chest radiograph 02/14/2020 FINDINGS: Stabled Port-A-Cath. Similar-appearing masslike consolidation right upper hemithorax. Mild interval decrease in size of moderate loculated right pleural effusion. Similar bibasilar heterogeneous opacities. No definite pneumothorax. IMPRESSION: Mild interval decrease in size of moderate loculated right pleural effusion. No pneumothorax. Similar bilateral lower lung airspace opacities. Electronically Signed   By: Lovey Newcomer M.D.   On: 02/15/2020 13:39   CT CHEST W CONTRAST  Result Date: 02/14/2020 CLINICAL DATA:  Progressive consolidation despite antibiotic therapy, lung cancer, pneumonia EXAM: CT CHEST WITH CONTRAST TECHNIQUE: Multidetector CT imaging of the chest was performed during intravenous contrast administration. CONTRAST:  51mL OMNIPAQUE IOHEXOL 300 MG/ML  SOLN COMPARISON:  12/30/2019, chest radiographs 03/03/2020 and 02/14/2020 FINDINGS: Cardiovascular: Post radiation changes are noted involving the right upper lobe with attenuation of the right upper lobe pulmonary arterial vasculature. The central pulmonary arteries are of normal caliber and are otherwise unremarkable. Moderate coronary artery calcification. Global cardiac size within normal limits. No pericardial effusion. The thoracic aorta is of normal caliber. Minimal atherosclerotic calcification is seen within the descending thoracic aorta and proximal arch vasculature. Right internal jugular chest port is seen with its tip within the right atrium. Mediastinum/Nodes: Thyroid unremarkable. No pathologic thoracic adenopathy. Esophagus unremarkable. Lungs/Pleura: Post radiation changes are noted within the right upper lobe with dense consolidation again noted. Additionally, there is more nodular soft tissue noted within the central right middle lobe, best appreciated on axial image # 53/2, which appears stable since prior examination and is suspicious for residual disease.  There are, however, superimposed airspace infiltrates noted within the peripheral right apex as well as within the lingula and within the lateral segment of the left lower lobe in keeping with multifocal pneumonic infiltrate, new since prior examination. Additionally, there has developed moderate bilateral pleural effusions, right greater than left. Multiple apical blebs are seen bilaterally. There is no pneumothorax. There is debris within the right upper lobe bronchus, best appreciated on axial image # 45/5. No central obstructing mass, however, is identified. Upper Abdomen: Multiple cysts are seen scattered throughout the liver. Limited images of the upper abdomen are otherwise unremarkable. Musculoskeletal: No acute bone abnormality. IMPRESSION: Multifocal pulmonary infiltrates in keeping with atypical infection in the appropriate clinical setting. Post radiation changes within the right upper lobe with more nodular soft tissue again identified within the right middle lobe centrally suspicious for residual disease. This likely represents the residual mass noted on prior PET CT examination of 10/03/2019. There is, however, no central obstructing mass and the central airways are patent, though debris-filled within the right upper lobe. Moderate bilateral pleural effusions, right greater than left. Aortic Atherosclerosis (ICD10-I70.0). Electronically Signed   By: Fidela Salisbury MD   On: 02/14/2020 19:27   [place] CHEST (PLEURAL EFFUSION)  Result Date: 02/15/2020 CLINICAL DATA:  74 year old male with right-sided pleural effusion. EXAM: CHEST ULTRASOUND  COMPARISON:  None. FINDINGS: There is a small right pleural effusion. IMPRESSION: Small right pleural effusion. Electronically Signed   By: Anner Crete M.D.   On: 02/15/2020 17:05   DG CHEST PORT 1 VIEW  Result Date: 02/14/2020 CLINICAL DATA:  Patient with history of lung cancer. Nausea and vomiting. EXAM: PORTABLE CHEST 1 VIEW COMPARISON:  Chest radiograph  02/10/2020. FINDINGS: Right anterior chest wall Port-A-Cath is present with tip projecting over the superior vena cava. Stable cardiac and mediastinal contours. Persistent masslike opacity right upper hemithorax. Slight interval increase in moderate right pleural effusion. Increased bilateral mid lower lung airspace opacities. IMPRESSION: 1. Interval increase in size of moderate right pleural effusion. 2. Increasing bilateral mid and lower lung airspace opacities which may represent infection in the appropriate clinical setting. Electronically Signed   By: Lovey Newcomer M.D.   On: 02/14/2020 15:26   ECHOCARDIOGRAM COMPLETE  Result Date: 02/14/2020    ECHOCARDIOGRAM REPORT   Patient Name:   Ivan Daniels Date of Exam: 02/14/2020 Medical Rec #:  [id number]       Height:       71.0 in Accession #:    [id number]      Weight:       147.0 lb Date of Birth:  Feb 24, 1946      BSA:          1.850 m Patient Age:    74 years        BP:           115/61 mmHg Patient Gender: M               HR:           120 bpm. Exam Location:  Inpatient Procedure: 2D Echo Indications:    428.21 CHF  History:        Patient has no prior history of Echocardiogram examinations.                 Risk Factors:Dyslipidemia and Former Smoker. ETOH Abuse.                 emphysema of lung. lung cancer. hx of pneumothorax (2016).                 tobacco abuse hx.  Sonographer:    Jannett Celestine RDCS (AE) Referring Phys: [id number] STEPHEN K CHIU  Sonographer Comments: Technically difficult study due to poor echo windows, no parasternal window and no apical window. Image acquisition challenging due to respiratory motion. IMPRESSIONS  1. Technically difficult echo with very poor image quality.  2. Left ventricular ejection fraction, by estimation, is 55 to 60%. The left ventricle has normal function. The left ventricle has no regional wall motion abnormalities. There is not well visualized left ventricular hypertrophy. Left ventricular diastolic function could  not be evaluated.  3. Right ventricular systolic function was not well visualized. The right ventricular size is not well visualized.  4. A small pericardial effusion is present.  5. The mitral valve was not well visualized. No evidence of mitral valve regurgitation.  6. The aortic valve was not well visualized. Aortic valve regurgitation is not visualized. FINDINGS  Left Ventricle: Left ventricular ejection fraction, by estimation, is 55 to 60%. The left ventricle has normal function. The left ventricle has no regional wall motion abnormalities. The left ventricular internal cavity size was normal in size. There is  not well visualized left ventricular hypertrophy. Left ventricular diastolic function could not be evaluated. Right Ventricle:  The right ventricular size is not well visualized. Right vetricular wall thickness was not well visualized. Right ventricular systolic function was not well visualized. Left Atrium: Left atrial size was not well visualized. Right Atrium: Right atrial size was not well visualized. Pericardium: A small pericardial effusion is present. Mitral Valve: The mitral valve was not well visualized. No evidence of mitral valve regurgitation. Tricuspid Valve: The tricuspid valve is not well visualized. Tricuspid valve regurgitation is not demonstrated. Aortic Valve: The aortic valve was not well visualized. Aortic valve regurgitation is not visualized. Pulmonic Valve: The pulmonic valve was not well visualized. Pulmonic valve regurgitation is not visualized. Aorta: The aortic root was not well visualized. IAS/Shunts: The interatrial septum was not well visualized. Additional Comments: Technically difficult echo with very poor image quality. Mertie Moores MD Electronically signed by Mertie Moores MD Signature Date/Time: 02/14/2020/4:20:53 PM    Final       Active Problems:   Intractable nausea and vomiting   Aspiration into airway     LOS: 4 days   Tye Savoy ,NP 02/21/2020,  10:12 AM

## 2020-02-16 NOTE — Progress Notes (Signed)
HOSPITAL MEDICINE OVERNIGHT EVENT NOTE    Notified by nursing that patient is developing recurrent fever at 830PM despite being dosed with Tylenol just several hours prior for yet another fever.  Chart reviewed, patient admitted for aspiration pneumonia with concern for possible empyema, transferred to [facility] for CT surgery evaluation and possible VATS procedure.  Considering persisting fevers despite intravenous antibiotics with ceftriaxone and azithromycin since 10/7 (which were somehow discontinued this AM anyway) we will transition patient intravenous Zosyn for improved anaerobic coverage per IDSA guidelines (specifically providing anaerobic coverage in cases of possible empyema).   For persisting fever, provide a one-time dose of intravenous Toradol.  Repeat blood cultures have already been obtained earlier today at the recommendation of CT surgery.  Patient is currently n.p.o. and on intravenous fluids for further CT surgery evaluation and possible VATS procedure.  Ivan Emerald  MD [facility]

## 2020-02-17 DIAGNOSIS — C3491 Malignant neoplasm of unspecified part of right bronchus or lung: Secondary | ICD-10-CM

## 2020-02-17 DIAGNOSIS — J869 Pyothorax without fistula: Secondary | ICD-10-CM

## 2020-02-17 DIAGNOSIS — T17908S Unspecified foreign body in respiratory tract, part unspecified causing other injury, sequela: Secondary | ICD-10-CM | POA: Diagnosis not present

## 2020-02-17 DIAGNOSIS — F200 Paranoid schizophrenia: Secondary | ICD-10-CM

## 2020-02-17 DIAGNOSIS — J189 Pneumonia, unspecified organism: Secondary | ICD-10-CM

## 2020-02-17 DIAGNOSIS — J439 Emphysema, unspecified: Secondary | ICD-10-CM

## 2020-02-17 DIAGNOSIS — R1319 Other dysphagia: Secondary | ICD-10-CM | POA: Diagnosis not present

## 2020-02-17 DIAGNOSIS — K222 Esophageal obstruction: Secondary | ICD-10-CM

## 2020-02-17 LAB — CULTURE, BLOOD (ROUTINE X 2)
Culture: NO GROWTH
Culture: NO GROWTH
Special Requests: ADEQUATE
Special Requests: ADEQUATE

## 2020-02-17 LAB — COMPREHENSIVE METABOLIC PANEL
ALT: 18 U/L (ref 0–44)
AST: 33 U/L (ref 15–41)
Albumin: 2.1 g/dL — ABNORMAL LOW (ref 3.5–5.0)
Alkaline Phosphatase: 33 U/L — ABNORMAL LOW (ref 38–126)
Anion gap: 10 (ref 5–15)
BUN: 6 mg/dL — ABNORMAL LOW (ref 8–23)
CO2: 23 mmol/L (ref 22–32)
Calcium: 9 mg/dL (ref 8.9–10.3)
Chloride: 103 mmol/L (ref 98–111)
Creatinine, Ser: 1.12 mg/dL (ref 0.61–1.24)
GFR, Estimated: >60 mL/min (ref >60)
Glucose, Bld: 104 mg/dL — ABNORMAL HIGH (ref 70–99)
Potassium: 4.1 mmol/L (ref 3.5–5.1)
Sodium: 136 mmol/L (ref 135–145)
Total Bilirubin: 0.7 mg/dL (ref 0.3–1.2)
Total Protein: 5.9 g/dL — ABNORMAL LOW (ref 6.5–8.1)

## 2020-02-17 LAB — CBC
HCT: 32.3 % — ABNORMAL LOW (ref 39.0–52.0)
Hemoglobin: 10.1 g/dL — ABNORMAL LOW (ref 13.0–17.0)
MCH: 27.2 pg (ref 26.0–34.0)
MCHC: 31.3 g/dL (ref 30.0–36.0)
MCV: 87.1 fL (ref 80.0–100.0)
Platelets: 291 10*3/uL (ref 150–400)
RBC: 3.71 MIL/uL — ABNORMAL LOW (ref 4.22–5.81)
RDW: 15.8 % — ABNORMAL HIGH (ref 11.5–15.5)
WBC: 4.6 10*3/uL (ref 4.0–10.5)
nRBC: 0 % (ref 0.0–0.2)

## 2020-02-17 MED ORDER — HALOPERIDOL 5 MG PO TABS
5.0000 mg | ORAL_TABLET | Freq: Every day | ORAL | Status: DC
  Administered 2020-02-17: 5 mg via ORAL
  Filled 2020-02-17 (×3): qty 1

## 2020-02-17 MED ORDER — DEXTROSE IN LACTATED RINGERS 5 % IV SOLN: INTRAVENOUS | Status: DC

## 2020-02-17 NOTE — Plan of Care (Signed)
Continue to monitor

## 2020-02-17 NOTE — Progress Notes (Signed)
PROGRESS NOTE    Maribel Luis Vernet  VEH:[id number] DOB: Jan 14, 1946 DOA: 02/13/2020 PCP: [name], FNP    Brief Narrative:  Mr. Buckle was admitted with the working diagnosis of right lower lobe aspiration pneumonia complicated with right empyema.   74 year old male with past medical history for non-small cell lung cancer (on chemotherapy), reported nausea, vomiting and generalized weakness for 2 weeks.  Significant decrease in p.o. intake, due to rapid and progressive deconditioning he was brought to the hospital by his sister.  On his initial physical examination blood pressure 128/68, heart rate 121, respiratory rate 34, oxygen saturation 93%, heart S1-S2, present, tachycardic, lungs with decreased breath sounds, mainly at the right side, abdomen soft, no lower extremity edema. Sodium 132, potassium 3.4, chloride 99, bicarb 22, glucose 97, BUN 9, creatinine 0.92, white count 4.9, hemoglobin 10.1, hematocrit 31.6, platelets 312.  SARS COVID-19 negative.  Urinalysis negative for infection. Chest radiograph with right pleural effusion, right lower lobe opacity, right upper lobe infiltrate. EKG 125 bpm, normal axis, normal intervals, sinus rhythm, no ST segment or T wave changes.  Patient was placed on antibiotic therapy and supplemental 02 per Nikolai. Speech evaluation recommended dysphagia 3 diet and possible further GI evaluation for esophageal stricture.  Unsuccessful thoracentesis, likely loculated effusion. Consulted CT surgery with recommendations to transfer from [facility] to [facility] for possible surgical intervention VATS.  Patient transferred to [facility] on 10/11.    Assessment & Plan:   Principal Problem:   Right lower lobe pneumonia Active Problems:   Paranoid schizophrenia ([place])   Non-small cell lung cancer ([place])   Pulmonary emphysema ([facility])   Dysphagia   Esophageal stricture   Intractable nausea and vomiting   Aspiration into airway   Empyema ([place])   1. Right lower lobe aspiration  pneumonia, community acquired (present on amdission), complicated with right loculated effusion possible empyema. Patient continue to have fever, last night change antibiotic therapy with Zosyn. Wbc is 4,6, blood cultures remained with no growth.   Continue antibiotic therapy with IV zosyn, currently NPO for possible surgical procedure.  Follow up on cell count and temperature curve. Pleural fluids analysis.   2. COPD with no exacerbation. Continue bronchodilator therapy with albuterol and inhaled corticosteroids.   3. Non small cell lung cancer/ anemia of malignancy/ severe physical functional deconditioning. Old records personally reviewed from office visit to oncology on 10/06. Recurrent disease, right upper lobe mass. On chemotherapy as outpatient.   hgb stable at 10.1 with hct at 32,3  4. Hypokalemia. K this am is up to 4,1 and renal function stable with serum cr at 1,12. Continue gently hydration with balanced electrolyte solutions with dextrose. At 75 ml per H.   Follow up on renal function in am.   5. Swallow dysfunction with possible esophageal stricture. Continue with pantoprazole and advance diet if no procedure schedule for this am.  Possible endoscopy on this admission.   6. Schizophrenia. Will resume home meds, to prevent decompensation.   Patient continue to be at high risk for worsening pneumonia and empyema.   Status is: Inpatient  Remains inpatient appropriate because:IV treatments appropriate due to intensity of illness or inability to take PO   Dispo: The patient is from: Home              Anticipated d/c is to: Home              Anticipated d/c date is: 3 days  Patient currently is not medically stable to d/c.    DVT prophylaxis: Enoxaparin   Code Status:   dnr  Family Communication:  I spoke with patient's sister at the bedside, we talked in detail about patient's condition, plan of care and prognosis and all questions were addressed.       Nutrition Status: Nutrition Problem: Increased nutrient needs Etiology: catabolic illness, cancer and cancer related treatments Signs/Symptoms: estimated needs Interventions: Boost Breeze, MVI, Other (Comment) (Pro[employer] Plus)     Skin Documentation:     Consultants:   GI   CT surgery    Antimicrobials:   Zosyn     Subjective: Patient is very weak and deconditioned, he denies any chest pain or dyspnea at rest, no nausea or vomiting ,he has been npo for the last few days.   Objective: Vitals:   02/17/20 0000 02/17/20 0045 02/17/20 0400 02/17/20 0806  BP: 113/73  134/64 113/72  Pulse: (!) 116  (!) 112 (!) 114  Resp: (!) 39  19 20  Temp: 98.6 F (37 C) 99 F (37.2 C) (!) 97.5 F (36.4 C) 98.2 F (36.8 C)  TempSrc: Oral Rectal Oral Oral  SpO2: 95%  94% 92%  Weight:      Height:        Intake/Output Summary (Last 24 hours) at 02/17/2020 9628 Last data filed at 02/17/2020 3662 Gross per 24 hour  Intake 1101.7 ml  Output 650 ml  Net 451.7 ml   Filed Weights   02/21/2020 2050  Weight: 66.7 kg    Examination:   General: Not in pain or dyspnea, deconditioned  Neurology: Awake and alert, non focal  E ENT: no pallor, no icterus, oral mucosa moist Cardiovascular: No JVD. S1-S2 present, rhythmic, no gallops, rubs, or murmurs. No lower extremity edema. Pulmonary:  Positive  breath sounds bilaterally,  with wheezing, rhonchi or rales. Decreased breath sounds at the right side.  Gastrointestinal. Abdomen soft and non tender Skin. No rashes Musculoskeletal: no joint deformities     Data Reviewed: I have personally reviewed following labs and imaging studies  CBC: Recent Labs  Lab 02/11/20 1425 02/07/2020 1137 02/13/20 0333 02/14/20 0355 02/15/20 0400 03/01/2020 0315 02/17/20 0149  WBC 4.0   < > 3.2* 4.3 4.7 4.3 4.6  NEUTROABS 2.1  --   --   --   --   --   --   HGB 10.2*   < > 9.3* 9.7* 9.7* 10.0* 10.1*  HCT 31.1*   < > 28.6* 29.9* 30.0* 30.8* 32.3*   MCV 84.7   < > 87.7 87.7 87.2 87.7 87.1  PLT 286   < > 252 291 285 321 291   < > = values in this interval not displayed.   Basic Metabolic Panel: Recent Labs  Lab 02/09/2020 1137 03/05/2020 2130 02/13/20 0333 02/14/20 0355 02/15/20 0400 03/07/2020 0315 02/17/20 0149  NA   < >  --  134* 134* 133* 136 136  K   < >  --  3.6 3.5 3.2* 3.4* 4.1  CL   < >  --  104 101 97* 100 103  CO2   < >  --  21* 23 26 27 23   GLUCOSE   < >  --  112* 132* 116* 125* 104*  BUN   < >  --  7* <5* <5* 7* 6*  CREATININE   < > 0.85 0.78 0.76 0.85 0.86 1.12  CALCIUM   < >  --  7.9*  8.1* 8.3* 8.7* 9.0  MG  --  1.9  --   --   --  1.8  --    < > = values in this interval not displayed.   GFR: Estimated Creatinine Clearance: 55.4 mL/min (by C-G formula based on SCr of 1.12 mg/dL). Liver Function Tests: Recent Labs  Lab 02/13/20 0333 02/14/20 0355 02/15/20 0400 02/11/2020 0315 02/17/20 0149  AST 33 33 35 34 33  ALT 22 20 19 21 18   ALKPHOS 30* 33* 33* 35* 33*  BILITOT 1.0 0.8 0.8 0.7 0.7  PROT 5.8* 5.9* 5.9* 6.2* 5.9*  ALBUMIN 2.6* 2.6* 2.5* 2.6* 2.1*   No results for input(s): LIPASE, AMYLASE in the last 168 hours. No results for input(s): AMMONIA in the last 168 hours. Coagulation Profile: No results for input(s): INR, PROTIME in the last 168 hours. Cardiac Enzymes: No results for input(s): CKTOTAL, CKMB, CKMBINDEX, TROPONINI in the last 168 hours. BNP (last 3 results) No results for input(s): PROBNP in the last 8760 hours. HbA1C: No results for input(s): HGBA1C in the last 72 hours. CBG: Recent Labs  Lab 03/06/2020 1051  GLUCAP 82   Lipid Profile: No results for input(s): CHOL, HDL, LDLCALC, TRIG, CHOLHDL, LDLDIRECT in the last 72 hours. Thyroid Function Tests: No results for input(s): TSH, T4TOTAL, FREET4, T3FREE, THYROIDAB in the last 72 hours. Anemia Panel: No results for input(s): VITAMINB12, FOLATE, FERRITIN, TIBC, IRON, RETICCTPCT in the last 72 hours.    Radiology Studies: I have  reviewed all of the imaging during this hospital visit personally     Scheduled Meds: . chlorhexidine  15 mL Mouth Rinse BID  . Chlorhexidine Gluconate Cloth  6 each Topical Daily  . enoxaparin (LOVENOX) injection  40 mg Subcutaneous Q24H  . latanoprost  1 drop Both Eyes QHS  . mouth rinse  15 mL Mouth Rinse q12n4p  . pantoprazole (PROTONIX) IV  40 mg Intravenous Q12H  . umeclidinium-vilanterol  1 puff Inhalation Daily   Continuous Infusions: . dextrose 5 % and 0.9% NaCl 50 mL/hr at 02/17/20 0200  . piperacillin-tazobactam (ZOSYN)  IV 3.375 g (02/17/20 0544)     LOS: 5 days        Haylin Camilli Gerome Apley, MD

## 2020-02-17 NOTE — Progress Notes (Signed)
Patient ate a few small bites of dinner tray and felt nauseated. Zofran given per order. Will continue to monitor.

## 2020-02-17 NOTE — Progress Notes (Signed)
      [address]       [place] [address]             [phone]      1 Day Post-Op Procedure(s) (LRB): ESOPHAGOGASTRODUODENOSCOPY (EGD) WITH PROPOFOL (N/A) Subjective: No pain today or shortness of breath.   Objective: Vital signs in last 24 hours: Temp:  [97.5 F (36.4 C)-101.4 F (38.6 C)] 98.6 F (37 C) (10/12 1238) Pulse Rate:  [112-127] 117 (10/12 1238) Cardiac Rhythm: Sinus tachycardia (10/12 0729) Resp:  [19-45] 20 (10/12 1238) BP: (105-142)/(54-81) 119/68 (10/12 1238) SpO2:  [92 %-100 %] 100 % (10/12 1238)     Intake/Output from previous day: 10/11 0701 - 10/12 0700 In: 1255 [I.V.:855; IV Piggyback:400] Out: 650 [Urine:650] Intake/Output this shift: No intake/output data recorded.  General appearance: cooperative and more alert today Heart: sinus tachycardia Lungs: clear to auscultation bilaterally Abdomen: soft, non-tender; bowel sounds normal; no masses,  no organomegaly Extremities: extremities normal, atraumatic, no cyanosis or edema Wound: n/a  Lab Results: Recent Labs    02/18/2020 0315 02/17/20 0149  WBC 4.3 4.6  HGB 10.0* 10.1*  HCT 30.8* 32.3*  PLT 321 291   BMET:  Recent Labs    03/06/2020 0315 02/17/20 0149  NA 136 136  K 3.4* 4.1  CL 100 103  CO2 27 23  GLUCOSE 125* 104*  BUN 7* 6*  CREATININE 0.86 1.12  CALCIUM 8.7* 9.0    PT/INR: No results for input(s): LABPROT, INR in the last 72 hours. ABG    Component Value Date/Time   TCO2 25 02/07/2020 1146   CBG (last 3)  No results for input(s): GLUCAP in the last 72 hours.  Assessment/Plan: S/P Procedure(s) (LRB): ESOPHAGOGASTRODUODENOSCOPY (EGD) WITH PROPOFOL (N/A)  1. Loculated pleural effusion. Plans for CT guided chest tube placement on the right. Not a surgical candidate. Timing will depend on plavix washout 2. Following blood cultures and sputum cultures.  3. On IV Zosyn for broad spectrum coverage. 4. Dysphagia-limited esophageal assessment on  swallow. Suggestion of fold thickening on single contrast evaluation with moderate size hiatal hernia and evidence of reflux.   Plan: Will let medicine decide on timing of CT guided chest tube placement by IR depending on plavix washout.    LOS: 5 days    Elgie Collard 02/17/2020

## 2020-02-17 NOTE — Progress Notes (Signed)
No plan for procedures today, will advance diet to liquids with aspiration precautions, if good toleration will continue to advance as tolerated.  Plan for CT guided chest tube drainage for right pleural effusion, will consult IR for procedure. Last dose of clopidogrel on 02/14/20.

## 2020-02-17 NOTE — Progress Notes (Signed)
Physical Therapy Treatment Patient Details Name: Ivan Daniels MRN: [id number] DOB: [birth year] Today's Date: 02/17/2020    History of Present Illness Patient is a 74 year old male with medical history significant of non-small cell lung CA. Presenting with 2 weeks of nausea, vomiting, and generalized weakness. PMH includes alcohol abuse, depression, paranoid schizophrenia    PT Comments    Pt supine in bed on arrival.  He required max cues and assistance to mobilize to edge of bed and move from bed to recliner chair.  Based on his functional decline will update recommendations to SNF placement at this time.  Continue to follow acutely to maximize functional gains during acute stay.  Will inform supervising PT of change in recommendations at this time.     Follow Up Recommendations  SNF;Supervision/Assistance - 24 hour     Equipment Recommendations  Rolling walker with 5" wheels    Recommendations for Other Services       Precautions / Restrictions Precautions Precautions: Fall Restrictions Weight Bearing Restrictions: No    Mobility  Bed Mobility Overal bed mobility: Needs Assistance Bed Mobility: Supine to Sit Rolling: Max assist Sidelying to sit: Max assist       General bed mobility comments: Max assistance to roll and elevate trunk into a seated position from sidelying.  Pt presents with posterior pelvic tilt and lean.  Transfers Overall transfer level: Needs assistance Equipment used: Rolling walker (2 wheeled) Transfers: Sit to/from Stand Sit to Stand: Max assist         General transfer comment: Cues for sequencing and hand placement.  Presents with flexed hip and trunk and facilitation to extend hips and upper trunk control/head control.  Pt very fatigue HR elevated to 133 bpm with standing.  Ambulation/Gait Ambulation/Gait assistance: Max assist Gait Distance (Feet): 5 Feet (steps from bed to recliner.) Assistive device: Rolling walker (2  wheeled) Gait Pattern/deviations: Step-to pattern;Decreased step length - right;Decreased step length - left;Shuffle;Trunk flexed Gait velocity: decr   General Gait Details: Short shuffling steps with poor posture and trunk control.  Knees noted to flex more when fatigued.   Stairs             Wheelchair Mobility    Modified Rankin (Stroke Patients Only)       Balance Overall balance assessment: Needs assistance Sitting-balance support: Feet supported Sitting balance-Leahy Scale: Fair       Standing balance-Leahy Scale: Poor                              Cognition Arousal/Alertness: Awake/alert Behavior During Therapy: Flat affect Overall Cognitive Status: Impaired/Different from baseline Area of Impairment: Orientation;Memory;Problem solving                 Orientation Level: Disoriented to;Time;Situation       Safety/Judgement: Decreased awareness of safety   Problem Solving: Slow processing;Decreased initiation;Requires verbal cues;Requires tactile cues        Exercises Other Exercises Other Exercises: Cervical flexion and extension 1x 10 reps.  AAROM. Other Exercises: Cervical rotation to R and L side.  1x10 reps.  AAROM.    General Comments        Pertinent Vitals/Pain Pain Assessment: Faces Faces Pain Scale: Hurts a little bit Pain Location: neck Pain Descriptors / Indicators: Guarding Pain Intervention(s): Monitored during session;Repositioned    Home Living  Prior Function            PT Goals (current goals can now be found in the care plan section) Acute Rehab PT Goals Patient Stated Goal: Pt agreeable to therapy Potential to Achieve Goals: Fair Progress towards PT goals: Progressing toward goals    Frequency    Min 3X/week      PT Plan Discharge plan needs to be updated    Co-evaluation              AM-PAC PT "6 Clicks" Mobility   Outcome Measure  Help needed  turning from your back to your side while in a flat bed without using bedrails?: A Lot Help needed moving from lying on your back to sitting on the side of a flat bed without using bedrails?: Total Help needed moving to and from a bed to a chair (including a wheelchair)?: Total Help needed standing up from a chair using your arms (e.g., wheelchair or bedside chair)?: Total Help needed to walk in hospital room?: Total Help needed climbing 3-5 steps with a railing? : Total 6 Click Score: 7    End of Session Equipment Utilized During Treatment: Gait belt Activity Tolerance: Patient limited by fatigue Patient left: in chair;with call bell/phone within reach;with chair alarm set;with family/visitor present Nurse Communication: Mobility status PT Visit Diagnosis: Muscle weakness (generalized) (M62.81);Difficulty in walking, not elsewhere classified (R26.2)     Time: 8101-7510 PT Time Calculation (min) (ACUTE ONLY): 28 min  Charges:  $Therapeutic Activity: 23-37 mins                     Erasmo Leventhal , PTA Acute Rehabilitation Services Pager [phone] Office [phone]     Adeleine Pask Eli Hose 02/17/2020, 4:13 PM

## 2020-02-17 NOTE — Progress Notes (Signed)
SLP Cancellation Note  Patient Details Name: Ivan Daniels MRN: [id number] DOB: Nov 19, 1945   Cancelled treatment:       Reason Eval/Treat Not Completed: Other (comment) (Patient was NPO for procedure). Patient had been NPO for procedure but this was then cancelled. SLP was not able to return in PM to see him. Will attempt follow up next date.  Sonia Baller, MA, CCC-SLP Speech Therapy [facility] Acute Rehab

## 2020-02-17 NOTE — Progress Notes (Signed)
Daily Rounding Note  02/17/2020, 10:26 AM  LOS: 5 days   SUBJECTIVE:   Chief complaint:   dysphagia  Not nauseated.  Difficulty handling secretions at times  OBJECTIVE:         Vital signs in last 24 hours:    Temp:  [97.5 F (36.4 C)-101.4 F (38.6 C)] 98.2 F (36.8 C) (10/12 0806) Pulse Rate:  [112-127] 114 (10/12 0806) Resp:  [19-45] 20 (10/12 0806) BP: (105-142)/(54-83) 113/72 (10/12 0806) SpO2:  [92 %-99 %] 92 % (10/12 0806) Last BM Date:  (unk) Filed Weights   02/15/2020 2050  Weight: 66.7 kg   General: thin, looks unwell   Heart: RRR Chest: bil ronchi, no cough, no dyspnea.   Abdomen: soft, NT, ND.  Active BS  Extremities: all toes amputated bil.   Neuro/Psych:  Paucity of speech.  Follows commands, no tremors.  Moves all 4 limbs.    Intake/Output from previous day: 10/11 0701 - 10/12 0700 In: 1255 [I.V.:855; IV Piggyback:400] Out: 650 [Urine:650]  Intake/Output this shift: No intake/output data recorded.  Lab Results: Recent Labs    02/15/20 0400 02/25/2020 0315 02/17/20 0149  WBC 4.7 4.3 4.6  HGB 9.7* 10.0* 10.1*  HCT 30.0* 30.8* 32.3*  PLT 285 321 291   BMET Recent Labs    02/15/20 0400 02/15/2020 0315 02/17/20 0149  NA 133* 136 136  K 3.2* 3.4* 4.1  CL 97* 100 103  CO2 26 27 23   GLUCOSE 116* 125* 104*  BUN <5* 7* 6*  CREATININE 0.85 0.86 1.12  CALCIUM 8.3* 8.7* 9.0   LFT Recent Labs    02/15/20 0400 03/02/2020 0315 02/17/20 0149  PROT 5.9* 6.2* 5.9*  ALBUMIN 2.5* 2.6* 2.1*  AST 35 34 33  ALT 19 21 18   ALKPHOS 33* 35* 33*  BILITOT 0.8 0.7 0.7   PT/INR No results for input(s): LABPROT, INR in the last 72 hours. Hepatitis Panel No results for input(s): HEPBSAG, HCVAB, HEPAIGM, HEPBIGM in the last 72 hours.  Studies/Results: DG Chest 1 View  Result Date: 02/15/2020 CLINICAL DATA:  Status post thoracentesis. EXAM: CHEST  1 VIEW COMPARISON:  Chest CT 02/14/2020; chest  radiograph 02/14/2020 FINDINGS: Stabled Port-A-Cath. Similar-appearing masslike consolidation right upper hemithorax. Mild interval decrease in size of moderate loculated right pleural effusion. Similar bibasilar heterogeneous opacities. No definite pneumothorax. IMPRESSION: Mild interval decrease in size of moderate loculated right pleural effusion. No pneumothorax. Similar bilateral lower lung airspace opacities. Electronically Signed   By: Lovey Newcomer M.D.   On: 02/15/2020 13:39   [place] CHEST (PLEURAL EFFUSION)  Result Date: 02/15/2020 CLINICAL DATA:  74 year old male with right-sided pleural effusion. EXAM: CHEST ULTRASOUND COMPARISON:  None. FINDINGS: There is a small right pleural effusion. IMPRESSION: Small right pleural effusion. Electronically Signed   By: Anner Crete M.D.   On: 02/15/2020 17:05   DG ESOPHAGUS W SINGLE CM (SOL OR THIN BA)  Result Date: 02/24/2020 CLINICAL DATA:  Dysphagia, history of lung cancer EXAM: Suggestion of fold thickening, moderately large hiatal hernia ESOPHOGRAM/BARIUM SWALLOW TECHNIQUE: Single contrast examination was performed using  thin barium. FLUOROSCOPY TIME:  Fluoroscopy Time:  3 minutes 7 seconds Radiation Exposure Index (if provided by the fluoroscopic device): 20.2 mGy Number of Acquired Spot Images: 0 COMPARISON:  CT chest from 02/14/2020 FINDINGS: The study is very limited due to difficulty in positioning of the patient and given that the patient is not able to swallow much more than a  small amount of liquid at the time even with encouragement and repeated attempts. Upon swallowing of thin barium the esophagus shows diffuse fold thickening which is not well evaluated on single contrast evaluation. There is a moderate size hiatal hernia. Motility could not be well assessed nor could luminal caliber due to the limited volume the could be ingested in given moment during the exam. Suggestion of some narrowing of the CP segment of the esophagus though this  appears smooth on the LPO projection and could not be well assessed due to difficulties in patient positioning and overlapping structures on the the RPO projection best seen on image 21 of series 4. Limited distensibility of the esophagus diffusely particularly distally is suggested with potential pulsion diverticulum along the distal esophagus as well. On the final series even with head of bed elevated there is gross reflux into the distal esophagus from the stomach and into the mid and proximal esophagus as well. IMPRESSION: 1. Limited esophageal assessment due to limited ability to ingest and a barium to distend the esophagus adequately. 2. Suggestion of fold thickening on single contrast evaluation with moderate size hiatal hernia and evidence of reflux. 3. Question of narrowing at the proximal portion of the esophagus, cricopharyngeal segment, not well assessed. Electronically Signed   By: Zetta Bills M.D.   On: 02/17/2020 13:19    Scheduled Meds: . chlorhexidine  15 mL Mouth Rinse BID  . Chlorhexidine Gluconate Cloth  6 each Topical Daily  . enoxaparin (LOVENOX) injection  40 mg Subcutaneous Q24H  . haloperidol  5 mg Oral QHS  . latanoprost  1 drop Both Eyes QHS  . mouth rinse  15 mL Mouth Rinse q12n4p  . pantoprazole (PROTONIX) IV  40 mg Intravenous Q12H  . umeclidinium-vilanterol  1 puff Inhalation Daily   Continuous Infusions: . dextrose 5% lactated ringers    . piperacillin-tazobactam (ZOSYN)  IV 3.375 g (02/17/20 0544)   PRN Meds:.acetaminophen **OR** acetaminophen, chlorhexidine, labetalol, levalbuterol, ondansetron **OR** ondansetron (ZOFRAN) IV, sodium chloride flush   ASSESMENT:   *   Dysphagia, regurgitation/vomiting.  XRT and peptic esophageal stricture 2018.   Limited esophagram 10/11: s/o diffuse esoph fold thickening, ? Proximal esophageal narrowing and cricophayrgeal area, and a moderate HH observed Protonix 40 IV bid in place, no PPI or H2B at home.   .   *    Non-small cell lung Ca.  S/p XRT, receiving chemo.    *    RLL PNA, due to aspiration? Temp to 101.4 10/10 at 2030, afebrile since.   Loculated effusion, ? Empyema.  Consideration for VATS, not yet scheduled.  Dr Lowella Dell saw pt this AM, await plans    *   Tachycardia.  Improved.  Rated in 1teens, were in 120s.   *   Chronic Plavix for "clotting disorder" first notation in 2014.  LICA stenosis 9381.  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  Last dose Plavix was 10/8.  *   H/o severe anemia treated with transfusion with PRBCs and referral to GI 02/2008.  Colon normal 03/2005 (Dr Amedeo Plenty at [facility]). On iron therapy ever since.  Hemocyte daily now.   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     *   Suspect malnutrition.  Low albumin and limited po intake. Wt 69.2 kg late 12/2019, 66.7 kg now.      PLAN   *   EGD w  esoph dilatation once pulmonary issues finalized and thoracic surgery ok w proceeding to EGD.   Will follow pt peripherally via chart review, please feel free to call us back once we can proceed w EGD.    *   Pre-albumin in AM.    *   If unable to tolerate, keep down liquid supplements, may need cortrak tube placed for tube feeds or TPN initiated.      Azucena Freed  02/17/2020, 10:26 AM Phone [phone]

## 2020-02-17 NOTE — Progress Notes (Signed)
Dr. Vicente Serene text paged that Patient had Tylenol Supp. P.R. at 17:30 and his Temp remains high 101.4 Rectal H.R. in the 120's S.T. and Resp. 30-40's . Reden R.N. aware awaiting return call.

## 2020-02-17 NOTE — Progress Notes (Signed)
Occupational Therapy Treatment Patient Details Name: Ivan Daniels MRN: [id number] DOB: 27-Jul-1945 Today's Date: 02/17/2020    History of present illness Patient is a 74 year old male with medical history significant of non-small cell lung CA. Presenting with 2 weeks of nausea, vomiting, and generalized weakness. PMH includes alcohol abuse, depression, paranoid schizophrenia   OT comments  Pt. Was able to sit eob s to initally min guard assist. Attempted to stand pt. Twice and was unable to come to full stand. Pt. Was max a to side scoot to head of bed. Pt was mod A with sit to supine.   Follow Up Recommendations  SNF;Supervision/Assistance - 24 hour    Equipment Recommendations  3 in 1 bedside commode    Recommendations for Other Services      Precautions / Restrictions Precautions Precautions: Fall Restrictions Weight Bearing Restrictions: No       Mobility Bed Mobility Overal bed mobility: Needs Assistance Bed Mobility: Sit to Supine     Supine to sit: Max assist Sit to supine: Mod assist   General bed mobility comments: Pt. required cues to intiate tasks  Transfers Overall transfer level: Needs assistance     Sit to Stand: Max assist (partial stand)         General transfer comment: unable to come to full stand.    Balance     Sitting balance-Leahy Scale: Fair       Standing balance-Leahy Scale: Poor                             ADL either performed or assessed with clinical judgement   ADL Overall ADL's : Needs assistance/impaired                 Upper Body Dressing : Moderate assistance;Sitting   Lower Body Dressing: Sit to/from stand;Total assistance               Functional mobility during ADLs: Maximal assistance (sit to partial stand. ) General ADL Comments: Pt. required total assist to don socks.      Vision   Vision Assessment?: No apparent visual deficits   Perception     Praxis      Cognition  Arousal/Alertness: Awake/alert Behavior During Therapy: Flat affect Overall Cognitive Status: Impaired/Different from baseline Area of Impairment: Orientation;Memory;Problem solving                 Orientation Level: Disoriented to;Time;Situation           Problem Solving: Slow processing;Decreased initiation;Requires verbal cues;Requires tactile cues          Exercises     Shoulder Instructions       General Comments      Pertinent Vitals/ Pain       Pain Assessment: No/denies pain  Home Living                                          Prior Functioning/Environment              Frequency  Min 2X/week        Progress Toward Goals  OT Goals(current goals can now be found in the care plan section)  Progress towards OT goals: Progressing toward goals  Acute Rehab OT Goals Patient Stated Goal: Pt agreeable to therapy OT Goal Formulation: With patient  Time For Goal Achievement: 02/27/20 Potential to Achieve Goals: Good ADL Goals Pt Will Perform Grooming: with supervision;standing Pt Will Perform Lower Body Dressing: with min guard assist;sit to/from stand;sitting/lateral leans Pt Will Transfer to Toilet: with supervision;ambulating;regular height toilet Pt Will Perform Toileting - Clothing Manipulation and hygiene: with supervision;sit to/from stand;sitting/lateral leans  Plan Discharge plan remains appropriate    Co-evaluation                 AM-PAC OT "6 Clicks" Daily Activity     Outcome Measure   Help from another person eating meals?: A Little Help from another person taking care of personal grooming?: A Little Help from another person toileting, which includes using toliet, bedpan, or urinal?: Total Help from another person bathing (including washing, rinsing, drying)?: A Lot Help from another person to put on and taking off regular upper body clothing?: A Lot Help from another person to put on and taking off  regular lower body clothing?: Total 6 Click Score: 12    End of Session Equipment Utilized During Treatment: Gait belt;Oxygen  OT Visit Diagnosis: Other abnormalities of gait and mobility (R26.89);Unsteadiness on feet (R26.81);Muscle weakness (generalized) (M62.81);Other symptoms and signs involving cognitive function   Activity Tolerance Patient limited by lethargy   Patient Left in bed;with call bell/phone within reach;with bed alarm set;with family/visitor present   Nurse Communication  (ok therapy)        Time: 0950-1020 OT Time Calculation (min): 30 min  Charges: OT General Charges $OT Visit: 1 Visit OT Treatments $Self Care/Home Management : 8-22 mins $Therapeutic Activity: 8-22 mins  Reece Packer OT/L   Antoinette Haskett 02/17/2020, 1:01 PM

## 2020-02-17 NOTE — Progress Notes (Signed)
Spoke with Dr. Vicente Serene see new orders.

## 2020-02-18 ENCOUNTER — Inpatient Hospital Stay (HOSPITAL_COMMUNITY): Payer: Medicare Other

## 2020-02-18 DIAGNOSIS — T17908S Unspecified foreign body in respiratory tract, part unspecified causing other injury, sequela: Secondary | ICD-10-CM | POA: Diagnosis not present

## 2020-02-18 DIAGNOSIS — J189 Pneumonia, unspecified organism: Secondary | ICD-10-CM | POA: Diagnosis not present

## 2020-02-18 DIAGNOSIS — J869 Pyothorax without fistula: Secondary | ICD-10-CM | POA: Diagnosis not present

## 2020-02-18 DIAGNOSIS — K222 Esophageal obstruction: Secondary | ICD-10-CM | POA: Diagnosis not present

## 2020-02-18 LAB — BASIC METABOLIC PANEL
Anion gap: 8 (ref 5–15)
Anion gap: 11 (ref 5–15)
BUN: 5 mg/dL — ABNORMAL LOW (ref 8–23)
BUN: 5 mg/dL — ABNORMAL LOW (ref 8–23)
CO2: 25 mmol/L (ref 22–32)
CO2: 24 mmol/L (ref 22–32)
Calcium: 8.5 mg/dL — ABNORMAL LOW (ref 8.9–10.3)
Calcium: 8.8 mg/dL — ABNORMAL LOW (ref 8.9–10.3)
Chloride: 102 mmol/L (ref 98–111)
Chloride: 100 mmol/L (ref 98–111)
Creatinine, Ser: 1.01 mg/dL (ref 0.61–1.24)
Creatinine, Ser: 1.09 mg/dL (ref 0.61–1.24)
GFR, Estimated: >60 mL/min (ref >60)
GFR, Estimated: >60 mL/min (ref >60)
Glucose, Bld: 121 mg/dL — ABNORMAL HIGH (ref 70–99)
Glucose, Bld: 109 mg/dL — ABNORMAL HIGH (ref 70–99)
Potassium: 3.6 mmol/L (ref 3.5–5.1)
Potassium: 4.2 mmol/L (ref 3.5–5.1)
Sodium: 135 mmol/L (ref 135–145)
Sodium: 135 mmol/L (ref 135–145)

## 2020-02-18 LAB — CBC WITH DIFFERENTIAL/PLATELET
Abs Immature Granulocytes: 0.01 10*3/uL (ref 0.00–0.07)
Basophils Absolute: 0 10*3/uL (ref 0.0–0.1)
Basophils Relative: 0 %
Eosinophils Absolute: 0.3 10*3/uL (ref 0.0–0.5)
Eosinophils Relative: 6 %
HCT: 30.5 % — ABNORMAL LOW (ref 39.0–52.0)
Hemoglobin: 9.5 g/dL — ABNORMAL LOW (ref 13.0–17.0)
Immature Granulocytes: 0 %
Lymphocytes Relative: 27 %
Lymphs Abs: 1.2 10*3/uL (ref 0.7–4.0)
MCH: 27.2 pg (ref 26.0–34.0)
MCHC: 31.1 g/dL (ref 30.0–36.0)
MCV: 87.4 fL (ref 80.0–100.0)
Monocytes Absolute: 0.7 10*3/uL (ref 0.1–1.0)
Monocytes Relative: 16 %
Neutro Abs: 2.3 10*3/uL (ref 1.7–7.7)
Neutrophils Relative %: 51 %
Platelets: 309 10*3/uL (ref 150–400)
RBC: 3.49 MIL/uL — ABNORMAL LOW (ref 4.22–5.81)
RDW: 15.9 % — ABNORMAL HIGH (ref 11.5–15.5)
WBC: 4.5 10*3/uL (ref 4.0–10.5)
nRBC: 0 % (ref 0.0–0.2)

## 2020-02-18 LAB — GLUCOSE, CAPILLARY: Glucose-Capillary: 104 mg/dL — ABNORMAL HIGH (ref 70–99)

## 2020-02-18 LAB — PREALBUMIN: Prealbumin: <5 mg/dL — ABNORMAL LOW (ref 18–38)

## 2020-02-18 MED ORDER — MORPHINE SULFATE (PF) 2 MG/ML IV SOLN
1.0000 mg | INTRAVENOUS | Status: DC | PRN
  Administered 2020-02-18: 1 mg via INTRAVENOUS
  Filled 2020-02-18: qty 1

## 2020-02-18 MED ORDER — ENOXAPARIN SODIUM 40 MG/0.4ML ~~LOC~~ SOLN: 40.0000 mg | SUBCUTANEOUS | Status: DC

## 2020-02-18 MED ORDER — VANCOMYCIN HCL 1500 MG/300ML IV SOLN
1500.0000 mg | Freq: Once | INTRAVENOUS | Status: AC
  Administered 2020-02-18: 1500 mg via INTRAVENOUS
  Filled 2020-02-18: qty 300

## 2020-02-18 MED ORDER — VANCOMYCIN HCL 750 MG/150ML IV SOLN
750.0000 mg | Freq: Two times a day (BID) | INTRAVENOUS | Status: DC
  Administered 2020-02-19: 750 mg via INTRAVENOUS
  Filled 2020-02-18: qty 150

## 2020-02-18 MED ORDER — FUROSEMIDE 10 MG/ML IJ SOLN
40.0000 mg | Freq: Once | INTRAMUSCULAR | Status: AC
  Administered 2020-02-18: 40 mg via INTRAVENOUS
  Filled 2020-02-18: qty 4

## 2020-02-18 NOTE — Progress Notes (Signed)
Patient lying in bed. V/s checked. BP 128/63 HR 125, O2 98% [place] 1.5 liters. Resp 38. Patient does not appear in any distress. Dr. Cathlean Sauer and [place], [place] with RR notified at this time. Will continue to monitor.

## 2020-02-18 NOTE — Significant Event (Signed)
Rapid Response Event Note   Reason for Call :  Tachypnea (RR 45-55), Tachycardia (135 bpm), lethargy  Initial Focused Assessment:  I evaluated Mr. Boomhower this morning- see prior note. He was awake and speaking to me in short sentences. Tachypneic at 30-35 breaths, tachycardic at 120-125 bpm. He was unable to have chest tube placed today due to last dose of Plavix was on 10/9. The plan is to have his chest tube placed tomorrow 10/14.   Upon my assessment now at 1750, Mr. Kauer appears more tired. He will open eyes to voice, does not sustain eye contact. PERRLA, 89mm. Lung sounds are diminished, rhonchus. Pt oral suctioned. Cough is weak. His breathing is shallow, minimal accessory muscle use. He will not answer my questions. After speaking with Dr. Cathlean Sauer, orders received for BiPAP and PRN morphine. Sister at bedside updated.  Interventions:  -BiPAP 12/6 40%  Plan of Care:  -Oral care per protocol -Reassess effectiveness of BiPAP -PRN morphine  Call rapid response for additional needs.  Event Summary:  MD Notified: Dr. Cathlean Sauer Call Time: 4268 Arrival Time: 3419 End Time: [place], RN

## 2020-02-18 NOTE — Significant Event (Signed)
Rapid Response Event Note   Reason for Call :  MEWS  VS: T 99.2, BP 128/63, HR 125, RR 40, SpO2 96% on 2L Ivan Daniels has a medical history of non-small cell lung cancer. Admitted with right lower lobe pneumonia.  Initial Focused Assessment:  Daniels lying in bed. He is alert, oriented to person, following commands. His speech is clear, he has a weak cough. Expiratory wheeze noted in the upper lobes. He is noted to be tachypneic, he is able to communicate with me in short sentences. Daniels denies shortness of breath or respiratory distress. Mild accessory muscle use. Skin is warm, pink.   Interventions:  -No intervention from RR RN  Plan of Care:  -Plan for chest tube placement for right pleural effusion -Monitor Daniels for increased work of breathing, distress or fatigue, and increase supplemental oxygen needs -Monitor Daniels for change in mental status -Oral care per protocol -PRN Xopenex available for wheezing, shortness of breath  Call rapid response for additional needs  Event Summary:  MD Notified: Dr. Cathlean Sauer Call Time: [phone] Arrival Time: 9476 End Time: [place], RN

## 2020-02-18 NOTE — Progress Notes (Signed)
      [address]       [place] [address]             [phone]       Order placed by medicine for CT guided right chest tube placement yesterday. Hopeful for procedure today.  Diet is advanced to liquids with aspiration precautions. Continue broad spectrum abx. Blood cultures ntd.  Nicholes Rough, PA-C

## 2020-02-18 NOTE — Progress Notes (Signed)
PROGRESS NOTE    Ivan Daniels  GLO:[id number] DOB: 1946-03-27 DOA: 02/29/2020 PCP: [name], FNP    Brief Narrative:  Ivan Daniels was admitted with the working diagnosis of right lower lobe aspiration pneumonia complicated with right empyema.   74 year old male with past medical history for non-small cell lung cancer (on chemotherapy), reported nausea, vomiting and generalized weakness for 2 weeks.  Significant decrease in p.o. intake, due to rapid and progressive deconditioning he was brought to the hospital by his sister.  On his initial physical examination blood pressure 128/68, heart rate 121, respiratory rate 34, oxygen saturation 93%, heart S1-S2, present, tachycardic, lungs with decreased breath sounds, mainly at the right side, abdomen soft, no lower extremity edema. Sodium 132, potassium 3.4, chloride 99, bicarb 22, glucose 97, BUN 9, creatinine 0.92, white count 4.9, hemoglobin 10.1, hematocrit 31.6, platelets 312.  SARS COVID-19 negative.  Urinalysis negative for infection. Chest radiograph with right pleural effusion, right lower lobe opacity, right upper lobe infiltrate. EKG 125 bpm, normal axis, normal intervals, sinus rhythm, no ST segment or T wave changes.  Patient was placed on antibiotic therapy and supplemental 02 per [place]. Speech evaluation recommended dysphagia 3 diet and possible further GI evaluation for esophageal stricture.  Unsuccessful thoracentesis, likely loculated effusion. Consulted CT surgery with recommendations to transfer from [facility] to [facility] for possible surgical intervention VATS.  Patient transferred to [facility] on 10/11.  Changed antibiotic therapy to Zosyn due to fever. CT surgery recommended IR CT guided drainage of right loculated effusion.   Today noted to have worsening increase work of breathing and evident dyspnea.    Assessment & Plan:   Principal Problem:   Right lower lobe pneumonia Active Problems:   Paranoid schizophrenia ([place])    Non-small cell lung cancer ([place])   Pulmonary emphysema ([facility])   Dysphagia   Esophageal stricture   Intractable nausea and vomiting   Aspiration into airway   Empyema ([place])   1. Right lower lobe aspiration pneumonia, community acquired (present on amdission), complicated with right loculated effusion possible empyema/ now with sepsis (not present on admission). Today with increase work of breathing, his respiratory rate is 30 to 42 and HR up to 125 bpm, blood pressure 129 mmHg, oxygenation is 96% on 2 L/min per [place]. Wbc is 4,5.   In the setting of worsening respiratory distress, will add vancomycin and continue with Zosyn. Continue with IV fluids at 75 ml per H and follow up on chest film today stat. Change back to NPO due to risk of aspiration.   Plan for CT guided right pleural effusion drainage in am.   Patient with poor prognosis in the setting of recurrent lung cancer and severe functional physical deconditioning.    2. COPD with no exacerbation. On bronchodilator therapy with albuterol and inhaled corticosteroids.   3. 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 Old records personally reviewed from office visit to oncology on 10/06. Recurrent disease, right upper lobe mass. On chemotherapy as outpatient.   IF worsening condition, will consult palliative care, patient is very poor prognosis.,   4. Hypokalemia. Renal function with cr at 1,0 with k at 3,6 and bicarbonate at 25. Continue hydration with balanced electrolyte solutions. Keep NPO for now.  Follow on renal function in am.    5. Swallow dysfunction with possible esophageal stricture. Patient vomited clear liquid diet this am. Suspected persistent aspiration, will change to NPO and will continue aspiration precautions.  Plan for possible diagnostic EGD when more stable from respiratory status.   6. Schizophrenia. Patient with no agitation, possible confusion, not  very communicative. His sister is at the bedside  Continue with haldol at night.   Patient continue to be at high risk for worsening respiratory failure and sepsis   Status is: Inpatient  Remains inpatient appropriate because:IV treatments appropriate due to intensity of illness or inability to take PO   Dispo: The patient is from: Home              Anticipated d/c is to: SNF              Anticipated d/c date is: 3 days              Patient currently is not medically stable to d/c.   DVT prophylaxis: Enoxaparin   Code Status:    dnr   Family Communication:  I spoke with patient's sister at the bedside, we talked in detail about patient's condition, plan of care and prognosis and all questions were addressed.      Nutrition Status: Nutrition Problem: Increased nutrient needs Etiology: catabolic illness, cancer and cancer related treatments Signs/Symptoms: estimated needs Interventions: Boost Breeze, MVI, Other (Comment) ([employer])     Skin Documentation:     Consultants:   CT surgery   IR   Procedures:   US thoracentesis not successful   Antimicrobials:   Vancomycin and zosyn     Subjective: Patient in evident distress, denies any pain or dyspnea. He vomiting his clear liquid diet this am.   Objective: Vitals:   02/18/20 0900 02/18/20 0930 02/18/20 1000 02/18/20 1100  BP: 138/60 136/64 (!) 130/57 121/60  Pulse: (!) 125 (!) 126 (!) 126 (!) 125  Resp: (!) 44 (!) 40 (!) 48 (!) 48  Temp: 99.2 F (37.3 C)   99.7 F (37.6 C)  TempSrc: Oral   Oral  SpO2: 98% 94% 95% 96%  Weight:      Height:        Intake/Output Summary (Last 24 hours) at 02/18/2020 1352 Last data filed at 02/17/2020 1600 Gross per 24 hour  Intake 198.93 ml  Output --  Net 198.93 ml   Filed Weights   02/27/2020 2050  Weight: 66.7 kg    Examination:   General: deconditioned and ill looking appearing. Not very communicative Neurology: somnolent but easy to arouse, follows  simple commands and answers to simple questions.   E ENT: positive pallor, no icterus, oral mucosa moist Cardiovascular: No JVD. S1-S2 present, rhythmic, no gallops, rubs, or murmurs. No lower extremity edema. Pulmonary: positive breath sounds bilaterally, decreased inspiratory effort with bilateral rhonchi and rales. No wheezing.  Gastrointestinal. Abdomen soft and non tender Skin. No rashes Musculoskeletal: bilateral toes amputation.      Data Reviewed: I have personally reviewed following labs and imaging studies  CBC: Recent Labs  Lab 02/11/20 1425 03/04/2020 1137 02/14/20 0355 02/15/20 0400 03/07/2020 0315 02/17/20 0149 02/18/20 0345  WBC 4.0   < > 4.3 4.7 4.3 4.6 4.5  NEUTROABS 2.1  --   --   --   --   --  2.3  HGB 10.2*   < > 9.7* 9.7* 10.0* 10.1* 9.5*  HCT 31.1*   < > 29.9* 30.0* 30.8* 32.3* 30.5*  MCV 84.7   < > 87.7 87.2 87.7 87.1 87.4  PLT 286   < > 291 285 321 291 309   < > = values in this  interval not displayed.   Basic Metabolic Panel: Recent Labs  Lab 02/27/2020 2130 02/13/20 0333 02/14/20 0355 02/15/20 0400 02/25/2020 0315 02/17/20 0149 02/18/20 0345  NA  --    < > 134* 133* 136 136 135  K  --    < > 3.5 3.2* 3.4* 4.1 3.6  CL  --    < > 101 97* 100 103 102  CO2  --    < > 23 26 27 23 25   GLUCOSE  --    < > 132* 116* 125* 104* 121*  BUN  --    < > <5* <5* 7* 6* 5*  CREATININE 0.85   < > 0.76 0.85 0.86 1.12 1.01  CALCIUM  --    < > 8.1* 8.3* 8.7* 9.0 8.5*  MG 1.9  --   --   --  1.8  --   --    < > = values in this interval not displayed.   GFR: Estimated Creatinine Clearance: 61.5 mL/min (by C-G formula based on SCr of 1.01 mg/dL). Liver Function Tests: Recent Labs  Lab 02/13/20 0333 02/14/20 0355 02/15/20 0400 02/26/2020 0315 02/17/20 0149  AST 33 33 35 34 33  ALT 22 20 19 21 18   ALKPHOS 30* 33* 33* 35* 33*  BILITOT 1.0 0.8 0.8 0.7 0.7  PROT 5.8* 5.9* 5.9* 6.2* 5.9*  ALBUMIN 2.6* 2.6* 2.5* 2.6* 2.1*   No results for input(s): LIPASE, AMYLASE  in the last 168 hours. No results for input(s): AMMONIA in the last 168 hours. Coagulation Profile: No results for input(s): INR, PROTIME in the last 168 hours. Cardiac Enzymes: No results for input(s): CKTOTAL, CKMB, CKMBINDEX, TROPONINI in the last 168 hours. BNP (last 3 results) No results for input(s): PROBNP in the last 8760 hours. HbA1C: No results for input(s): HGBA1C in the last 72 hours. CBG: Recent Labs  Lab 02/18/2020 1051  GLUCAP 82   Lipid Profile: No results for input(s): CHOL, HDL, LDLCALC, TRIG, CHOLHDL, LDLDIRECT in the last 72 hours. Thyroid Function Tests: No results for input(s): TSH, T4TOTAL, FREET4, T3FREE, THYROIDAB in the last 72 hours. Anemia Panel: No results for input(s): VITAMINB12, FOLATE, FERRITIN, TIBC, IRON, RETICCTPCT in the last 72 hours.    Radiology Studies: I have reviewed all of the imaging during this hospital visit personally     Scheduled Meds: . chlorhexidine  15 mL Mouth Rinse BID  . Chlorhexidine Gluconate Cloth  6 each Topical Daily  . [START ON 03-13-2020] enoxaparin (LOVENOX) injection  40 mg Subcutaneous Q24H  . haloperidol  5 mg Oral QHS  . latanoprost  1 drop Both Eyes QHS  . mouth rinse  15 mL Mouth Rinse q12n4p  . pantoprazole (PROTONIX) IV  40 mg Intravenous Q12H  . umeclidinium-vilanterol  1 puff Inhalation Daily   Continuous Infusions: . dextrose 5% lactated ringers 75 mL/hr at 02/17/20 1319  . piperacillin-tazobactam (ZOSYN)  IV 3.375 g (02/18/20 0524)     LOS: 6 days        Ivan Curiale Gerome Apley, MD

## 2020-02-18 NOTE — Progress Notes (Signed)
SLP Cancellation Note  Patient Details Name: Ivan Daniels MRN: [id number] DOB: 12-Jan-1946   Cancelled treatment:       Reason Eval/Treat Not Completed: Patient at procedure or test/unavailable (Pt currently NPO and is with radiology tech at this time having CXR. SLP will f/u)  Tobie Poet I. Hardin Negus, [place], [place] Office number [phone] Pager [place] 02/18/2020, 3:13 PM

## 2020-02-18 NOTE — Progress Notes (Signed)
Pharmacy Antibiotic Note  Ivan Daniels is a 74 y.o. male  with pneumonia.  Pharmacy has been consulted for vancomycin dosing (he is also on zosyn). He is noted for right chest drain placement -WBC= 4.5, afebrile -SCr= 1, CrCl ~ 60  Plan: -vancomycin 1500mg  IV x1 followed by 750mg  IV q12h -Will follow renal function, cultures and clinical progress   Height: 5\' 11"  (180.3 cm) Weight: 66.7 kg (147 lb 0.8 oz) IBW/kg (Calculated) : 75.3  Temp (24hrs), Avg:99.1 F (37.3 C), Min:98.6 F (37 C), Max:99.7 F (37.6 C)  Recent Labs  Lab 02/24/2020 1240 02/10/2020 2130 02/14/20 0355 02/15/20 0400 02/06/2020 0315 02/17/20 0149 02/18/20 0345  WBC  --    < > 4.3 4.7 4.3 4.6 4.5  CREATININE  --    < > 0.76 0.85 0.86 1.12 1.01  LATICACIDVEN 1.0  --   --   --   --   --   --    < > = values in this interval not displayed.    Estimated Creatinine Clearance: 61.5 mL/min (by C-G formula based on SCr of 1.01 mg/dL).    Allergies  Allergen Reactions  . Benztropine Mesylate     REACTION: decrease ROM neck. Pt states he is not allergic   . Chlorpromazine Hcl     Unknown/ per pt causes him to draw up    Antimicrobials this admission: 10/11 zosyn 10/13 vanc  Dose adjustments this admission:   Microbiology results: 10/11 blood x2  Thank you for allowing pharmacy to be a part of this patient's care.  Hildred Laser, PharmD Clinical Pharmacist **Pharmacist phone directory can now be found on [email or web address] ([email or web address]).  Listed under [place].

## 2020-02-18 NOTE — Progress Notes (Signed)
Follow up chest film with signs of pulmonary edema, persistent right pleural effusion and right upper lobe mass.   Discontinue IV fluids and will do a trial of furosemide 40 mg IV x1. Continue close oxymetry monitoring.

## 2020-02-18 NOTE — Progress Notes (Signed)
Pt was placed on BIPAP per MD. RT will monitor.

## 2020-02-18 NOTE — Progress Notes (Signed)
Referring Physician(s): Arrien,M  Supervising Physician: Arne Cleveland  Patient Status:  [facility] - In-pt  Chief Complaint:  Dyspnea, lung cancer, pleural effusions  Subjective: Patient familiar to IR service from Port-A-Cath placement in 2016.  He has a history of metastatic non-small cell lung cancer currently on chemotherapy with prior radiation therapy.  He was recently admitted with nausea, vomiting, generalized weakness, decreased p.o. intake-deconditioning and with a working diagnosis of right lower lobe aspiration pneumonia/parapneumonic effusions.  Additional medical history sig for COPD, BPH, schizophrenia, clotting disorder currently on OP Plavix ,dysphagia with possible esophageal stricture.  Recent CT chest revealed multifocal pulmonary infiltrates with postradiation changes in the right upper lobe and more nodular soft tissue identified within the right middle lobe centrally suspicious for residual disease, right greater than left pleural effusions.  He was COVID-19 negative.  Currently 99.7, he is tachycardic.  BC normal, hemoglobin 9.5, platelets 309k, creat 1.01.  Latest blood cultures pending.  Request received for image guided drainage of complex right pleural effusion.  Past Medical History:  Diagnosis Date  . ALCOHOL ABUSE 11/12/2009   Qualifier: Diagnosis of  By: Hassell Done FNP, Tori Milks    . Allergy   . Anemia   . Anxiety   . BENIGN PROSTATIC HYPERTROPHY, HX OF 12/19/2006   Qualifier: Diagnosis of  By: Radene Ou MD, [place]    . Blood transfusion without reported diagnosis 2017  . Clotting disorder ([place])    takes PLAVIX  . Depression   . Emphysema of lung ([place])   . Full code status 02/10/2015  . GERD (gastroesophageal reflux disease)   . Hyperlipidemia   . Lung mass 01/25/2015   3.7 x 3.6 cm RUL spiculated lung mass with 2.0 cm right paratracheal lymph node suspicious for bronchogenic CA  . Non-small cell lung cancer ([place]) 01/27/15   non small-cell,squamous cell ca  RUL  . Paranoid schizophrenia ([place]) 10/31/2009   Qualifier: Diagnosis of  By: Jorene Minors, [place]    . Primary spontaneous pneumothorax, left 01/25/2015  . TOBACCO ABUSE 05/24/2009   Qualifier: Diagnosis of  By: Hassell Done FNP, Tori Milks     Past Surgical History:  Procedure Laterality Date  . APPENDECTOMY    . CHEST TUBE INSERTION Left   . COLONOSCOPY    . INSERTION CENTRAL VENOUS ACCESS DEVICE W/ SUBCUTANEOUS PORT Right   . LUNG BIOPSY Right 01/27/2015   Procedure: Right Upper Lobe Bronchus BIOPSY;  Surgeon: Grace Isaac, MD;  Location: [facility];  Service: Thoracic;  Laterality: Right;  Marland Kitchen VIDEO BRONCHOSCOPY WITH ENDOBRONCHIAL ULTRASOUND N/A 01/27/2015   Procedure: VIDEO BRONCHOSCOPY WITH ENDOBRONCHIAL ULTRASOUND;  Surgeon: Grace Isaac, MD;  Location: [facility] OR;  Service: Thoracic;  Laterality: N/A;      Allergies: Benztropine mesylate and Chlorpromazine hcl  Medications: Prior to Admission medications   Medication Sig Start Date End Date Taking? Authorizing Provider  aspirin EC 81 MG tablet Take 81 mg by mouth 2 (two) times daily.   Yes [provider]  benztropine (COGENTIN) 0.5 MG tablet Take 0.5 mg by mouth daily.    Yes [provider]  chlorhexidine (PERIDEX) 0.12 % solution 5 mLs by Mouth Rinse route as needed (gingivitis).  06/15/15  Yes [provider]  clopidogrel (PLAVIX) 75 MG tablet Take 75 mg by mouth daily.  12/09/12  Yes [provider]  ferrous fumarate (HEMOCYTE - 106 MG FE) 325 (106 FE) MG TABS tablet Take 1 tablet by mouth daily.    Yes [provider]  haloperidol (HALDOL) 5 MG tablet Take 5 mg by mouth at bedtime.  02/21/13  Yes [provider]  ondansetron (ZOFRAN) 8 MG tablet Take 1 tablet (8 mg total) by mouth every 8 (eight) hours as needed for nausea or vomiting. 01/08/19  Yes Curt Bears, MD  simvastatin (ZOCOR) 20 MG tablet Take 20 mg by mouth at bedtime.  02/14/13  Yes [provider]    umeclidinium-vilanterol (ANORO ELLIPTA) 62.5-25 MCG/INH AEPB Inhale 1 puff into the lungs daily. 04/11/19  Yes Tanda Rockers, MD  VYZULTA 0.024 % SOLN Place 1 drop into both eyes daily.  09/19/19  Yes [provider]  NONFORMULARY OR COMPOUNDED Belva  Combination Pain Cream -  Baclofen 2%, Doxepin 5%, Gabapentin 6%, Topiramate 2%, Pentoxifylline 3% Apply 1-2 grams to affected area 3-4 times daily Qty. 120 gm 3 refills Patient not taking: Reported on 02/17/2020 07/03/17   Gardiner Barefoot, DPM     Vital Signs: BP 121/60 (BP Location: Right Arm)   Pulse (!) 125   Temp 99.7 F (37.6 C) (Oral)   Resp (!) 48   Ht 5\' 11"  (1.803 m)   Wt 147 lb 0.8 oz (66.7 kg)   SpO2 96%   BMI 20.51 kg/m   Physical Exam patient awake but somewhat lethargic.  Does respond briefly to simple questions.  Sister in room.  Chest with diminished breath sounds bases.,  Intact right chest wall Port-A-Cath.  Heart with tachycardic but regular rhythm.  Abdomen soft, positive bowel sounds, nontender.  No lower extremity edema.  Imaging: DG Chest 1 View  Result Date: 02/15/2020 CLINICAL DATA:  Status post thoracentesis. EXAM: CHEST  1 VIEW COMPARISON:  Chest CT 02/14/2020; chest radiograph 02/14/2020 FINDINGS: Stabled Port-A-Cath. Similar-appearing masslike consolidation right upper hemithorax. Mild interval decrease in size of moderate loculated right pleural effusion. Similar bibasilar heterogeneous opacities. No definite pneumothorax. IMPRESSION: Mild interval decrease in size of moderate loculated right pleural effusion. No pneumothorax. Similar bilateral lower lung airspace opacities. Electronically Signed   By: Lovey Newcomer M.D.   On: 02/15/2020 13:39   CT CHEST W CONTRAST  Result Date: 02/14/2020 CLINICAL DATA:  Progressive consolidation despite antibiotic therapy, lung cancer, pneumonia EXAM: CT CHEST WITH CONTRAST TECHNIQUE: Multidetector CT imaging of the chest was performed during  intravenous contrast administration. CONTRAST:  23mL OMNIPAQUE IOHEXOL 300 MG/ML  SOLN COMPARISON:  12/30/2019, chest radiographs 03/05/2020 and 02/14/2020 FINDINGS: Cardiovascular: Post radiation changes are noted involving the right upper lobe with attenuation of the right upper lobe pulmonary arterial vasculature. The central pulmonary arteries are of normal caliber and are otherwise unremarkable. Moderate coronary artery calcification. Global cardiac size within normal limits. No pericardial effusion. The thoracic aorta is of normal caliber. Minimal atherosclerotic calcification is seen within the descending thoracic aorta and proximal arch vasculature. Right internal jugular chest port is seen with its tip within the right atrium. Mediastinum/Nodes: Thyroid unremarkable. No pathologic thoracic adenopathy. Esophagus unremarkable. Lungs/Pleura: Post radiation changes are noted within the right upper lobe with dense consolidation again noted. Additionally, there is more nodular soft tissue noted within the central right middle lobe, best appreciated on axial image # 53/2, which appears stable since prior examination and is suspicious for residual disease. There are, however, superimposed airspace infiltrates noted within the peripheral right apex as well as within the lingula and within the lateral segment of the left lower lobe in keeping with multifocal pneumonic infiltrate, new since prior examination. Additionally, there has developed moderate bilateral pleural effusions,  right greater than left. Multiple apical blebs are seen bilaterally. There is no pneumothorax. There is debris within the right upper lobe bronchus, best appreciated on axial image # 45/5. No central obstructing mass, however, is identified. Upper Abdomen: Multiple cysts are seen scattered throughout the liver. Limited images of the upper abdomen are otherwise unremarkable. Musculoskeletal: No acute bone abnormality. IMPRESSION: Multifocal  pulmonary infiltrates in keeping with atypical infection in the appropriate clinical setting. Post radiation changes within the right upper lobe with more nodular soft tissue again identified within the right middle lobe centrally suspicious for residual disease. This likely represents the residual mass noted on prior PET CT examination of 10/03/2019. There is, however, no central obstructing mass and the central airways are patent, though debris-filled within the right upper lobe. Moderate bilateral pleural effusions, right greater than left. Aortic Atherosclerosis (ICD10-I70.0). Electronically Signed   By: Fidela Salisbury MD   On: 02/14/2020 19:27   [place] CHEST (PLEURAL EFFUSION)  Result Date: 02/15/2020 CLINICAL DATA:  74 year old male with right-sided pleural effusion. EXAM: CHEST ULTRASOUND COMPARISON:  None. FINDINGS: There is a small right pleural effusion. IMPRESSION: Small right pleural effusion. Electronically Signed   By: Anner Crete M.D.   On: 02/15/2020 17:05   ECHOCARDIOGRAM COMPLETE  Result Date: 02/14/2020    ECHOCARDIOGRAM REPORT   Patient Name:   Ivan Daniels Date of Exam: 02/14/2020 Medical Rec #:  [id number]       Height:       71.0 in Accession #:    [id number]      Weight:       147.0 lb Date of Birth:  [birth year]      BSA:          1.850 m Patient Age:    74 years        BP:           115/61 mmHg Patient Gender: M               HR:           120 bpm. Exam Location:  Inpatient Procedure: 2D Echo Indications:    428.21 CHF  History:        Patient has no prior history of Echocardiogram examinations.                 Risk Factors:Dyslipidemia and Former Smoker. ETOH Abuse.                 emphysema of lung. lung cancer. hx of pneumothorax (2016).                 tobacco abuse hx.  Sonographer:    Jannett Celestine RDCS (AE) Referring Phys: [id number] STEPHEN K CHIU  Sonographer Comments: Technically difficult study due to poor echo windows, no parasternal window and no apical window. Image  acquisition challenging due to respiratory motion. IMPRESSIONS  1. Technically difficult echo with very poor image quality.  2. Left ventricular ejection fraction, by estimation, is 55 to 60%. The left ventricle has normal function. The left ventricle has no regional wall motion abnormalities. There is not well visualized left ventricular hypertrophy. Left ventricular diastolic function could not be evaluated.  3. Right ventricular systolic function was not well visualized. The right ventricular size is not well visualized.  4. A small pericardial effusion is present.  5. The mitral valve was not well visualized. No evidence of mitral valve regurgitation.  6. The aortic valve was not well visualized. Aortic  valve regurgitation is not visualized. FINDINGS  Left Ventricle: Left ventricular ejection fraction, by estimation, is 55 to 60%. The left ventricle has normal function. The left ventricle has no regional wall motion abnormalities. The left ventricular internal cavity size was normal in size. There is  not well visualized left ventricular hypertrophy. Left ventricular diastolic function could not be evaluated. Right Ventricle: The right ventricular size is not well visualized. Right vetricular wall thickness was not well visualized. Right ventricular systolic function was not well visualized. Left Atrium: Left atrial size was not well visualized. Right Atrium: Right atrial size was not well visualized. Pericardium: A small pericardial effusion is present. Mitral Valve: The mitral valve was not well visualized. No evidence of mitral valve regurgitation. Tricuspid Valve: The tricuspid valve is not well visualized. Tricuspid valve regurgitation is not demonstrated. Aortic Valve: The aortic valve was not well visualized. Aortic valve regurgitation is not visualized. Pulmonic Valve: The pulmonic valve was not well visualized. Pulmonic valve regurgitation is not visualized. Aorta: The aortic root was not well  visualized. IAS/Shunts: The interatrial septum was not well visualized. Additional Comments: Technically difficult echo with very poor image quality. Mertie Moores MD Electronically signed by Mertie Moores MD Signature Date/Time: 02/14/2020/4:20:53 PM    Final    DG ESOPHAGUS W SINGLE CM (SOL OR THIN BA)  Result Date: 02/11/2020 CLINICAL DATA:  Dysphagia, history of lung cancer EXAM: Suggestion of fold thickening, moderately large hiatal hernia ESOPHOGRAM/BARIUM SWALLOW TECHNIQUE: Single contrast examination was performed using  thin barium. FLUOROSCOPY TIME:  Fluoroscopy Time:  3 minutes 7 seconds Radiation Exposure Index (if provided by the fluoroscopic device): 20.2 mGy Number of Acquired Spot Images: 0 COMPARISON:  CT chest from 02/14/2020 FINDINGS: The study is very limited due to difficulty in positioning of the patient and given that the patient is not able to swallow much more than a small amount of liquid at the time even with encouragement and repeated attempts. Upon swallowing of thin barium the esophagus shows diffuse fold thickening which is not well evaluated on single contrast evaluation. There is a moderate size hiatal hernia. Motility could not be well assessed nor could luminal caliber due to the limited volume the could be ingested in given moment during the exam. Suggestion of some narrowing of the CP segment of the esophagus though this appears smooth on the LPO projection and could not be well assessed due to difficulties in patient positioning and overlapping structures on the the RPO projection best seen on image 21 of series 4. Limited distensibility of the esophagus diffusely particularly distally is suggested with potential pulsion diverticulum along the distal esophagus as well. On the final series even with head of bed elevated there is gross reflux into the distal esophagus from the stomach and into the mid and proximal esophagus as well. IMPRESSION: 1. Limited esophageal assessment  due to limited ability to ingest and a barium to distend the esophagus adequately. 2. Suggestion of fold thickening on single contrast evaluation with moderate size hiatal hernia and evidence of reflux. 3. Question of narrowing at the proximal portion of the esophagus, cricopharyngeal segment, not well assessed. Electronically Signed   By: Zetta Bills M.D.   On: 02/10/2020 13:19    Labs:  CBC: Recent Labs    02/15/20 0400 02/08/2020 0315 02/17/20 0149 02/18/20 0345  WBC 4.7 4.3 4.6 4.5  HGB 9.7* 10.0* 10.1* 9.5*  HCT 30.0* 30.8* 32.3* 30.5*  PLT 285 321 291 309    COAGS: No  results for input(s): INR, APTT in the last 8760 hours.  BMP: Recent Labs    12/31/19 1055 12/31/19 1055 01/22/20 1155 01/22/20 1155 01/26/20 0930 01/26/20 0930 02/07/20 1009 02/07/20 1146 02/15/20 0400 02/28/2020 0315 02/17/20 0149 02/18/20 0345  NA 139   < > 134*   < > 136   < > 132*   < > 133* 136 136 135  K 3.8   < > 3.4*   < > 3.5   < > 3.6   < > 3.2* 3.4* 4.1 3.6  CL 104   < > 99   < > 102   < > 97*   < > 97* 100 103 102  CO2 29   < > 28   < > 25   < > 26   < > 26 27 23 25   GLUCOSE 82   < > 109*   < > 99   < > 96   < > 116* 125* 104* 121*  BUN 10   < > 8   < > 7*   < > 8   < > <5* 7* 6* 5*  CALCIUM 9.9   < > 9.4   < > 8.5*   < > 8.4*   < > 8.3* 8.7* 9.0 8.5*  CREATININE 1.14   < > 1.09  --  0.99   < > 0.89   < > 0.85 0.86 1.12 1.01  GFRNONAA >60   < > >60  --  >60   < > >60   < > >60 >60 >60 >60  GFRAA >60  --  >60  --  >60  --  >60  --   --   --   --   --    < > = values in this interval not displayed.    LIVER FUNCTION TESTS: Recent Labs    02/14/20 0355 02/15/20 0400 02/28/2020 0315 02/17/20 0149  BILITOT 0.8 0.8 0.7 0.7  AST 33 35 34 33  ALT 20 19 21 18   ALKPHOS 33* 33* 35* 33*  PROT 5.9* 5.9* 6.2* 5.9*  ALBUMIN 2.6* 2.5* 2.6* 2.1*    Assessment and Plan: 74 yo male with history of metastatic non-small cell lung cancer currently on chemotherapy with prior radiation therapy.   He was recently admitted with nausea, vomiting, generalized weakness, decreased p.o. intake-deconditioning and with a working diagnosis of right lower lobe aspiration pneumonia/parapneumonic effusions.  Additional medical history sig for COPD, BPH, schizophrenia, clotting disorder currently on OP Plavix ,dysphagia with possible esophageal stricture.  Recent CT chest revealed multifocal pulmonary infiltrates with postradiation changes in the right upper lobe and more nodular soft tissue identified within the right middle lobe centrally suspicious for residual disease, right greater than left pleural effusions.  He was COVID-19 negative.  Currently 99.7, he is tachycardic.  BC normal, hemoglobin 9.5, platelets 309k, creat 1.01.  Latest blood cultures pending.  Request received for image guided drainage of complex right pleural effusion.  Imaging studies were reviewed by Dr. Kathlene Cote.  Details/risks of right chest drain placement, including but not limited to, internal bleeding, infection, injury to adjacent structures discussed with patient/sister with their understanding and consent.  Pt's last dose of Plavix was on 10/9, therefore procedure will be done on 10/14.   Electronically Signed: D. Rowe Robert, PA-C 02/18/2020, 11:56 AM   I spent a total of 25 minutes at the the patient's bedside AND on the patient's hospital floor or unit, greater than  50% of which was counseling/coordinating care for right chest drain placement    Patient ID: Ivan Daniels, male   DOB: [birth year], 74 y.o.   MRN: [id number]

## 2020-02-18 NOTE — Plan of Care (Signed)
Continue to monitor

## 2020-02-18 NOTE — Progress Notes (Addendum)
Patient's HR in the 130's BP 122/74, O2 100% on 2.5 liters. Resp 40-50's. Patient with increased work of breathing. Dr. Cathlean Sauer paged via [facility] system at this time. RR notified.   1752: Shelby RR RN at bedside.   1800: 117/74, HR 133, Resp. 47 O2 100% 2.5 Liters.

## 2020-02-19 DIAGNOSIS — K222 Esophageal obstruction: Secondary | ICD-10-CM | POA: Diagnosis not present

## 2020-02-19 DIAGNOSIS — Z515 Encounter for palliative care: Secondary | ICD-10-CM

## 2020-02-19 DIAGNOSIS — J189 Pneumonia, unspecified organism: Secondary | ICD-10-CM | POA: Diagnosis not present

## 2020-02-19 DIAGNOSIS — Z7189 Other specified counseling: Secondary | ICD-10-CM

## 2020-02-19 DIAGNOSIS — J869 Pyothorax without fistula: Secondary | ICD-10-CM | POA: Diagnosis not present

## 2020-02-19 DIAGNOSIS — C3491 Malignant neoplasm of unspecified part of right bronchus or lung: Secondary | ICD-10-CM | POA: Diagnosis not present

## 2020-02-19 DIAGNOSIS — R0600 Dyspnea, unspecified: Secondary | ICD-10-CM

## 2020-02-19 DIAGNOSIS — R1319 Other dysphagia: Secondary | ICD-10-CM | POA: Diagnosis not present

## 2020-02-19 LAB — BASIC METABOLIC PANEL
Anion gap: 16 — ABNORMAL HIGH (ref 5–15)
BUN: 9 mg/dL (ref 8–23)
CO2: 24 mmol/L (ref 22–32)
Calcium: 9.3 mg/dL (ref 8.9–10.3)
Chloride: 96 mmol/L — ABNORMAL LOW (ref 98–111)
Creatinine, Ser: 1.4 mg/dL — ABNORMAL HIGH (ref 0.61–1.24)
GFR, Estimated: 49 mL/min — ABNORMAL LOW (ref >60)
Glucose, Bld: 83 mg/dL (ref 70–99)
Potassium: 3.9 mmol/L (ref 3.5–5.1)
Sodium: 136 mmol/L (ref 135–145)

## 2020-02-19 MED ORDER — GLYCOPYRROLATE 0.2 MG/ML IJ SOLN: 0.2000 mg | INTRAMUSCULAR | Status: DC | PRN

## 2020-02-19 MED ORDER — VANCOMYCIN HCL 500 MG/100ML IV SOLN
500.0000 mg | Freq: Two times a day (BID) | INTRAVENOUS | Status: DC
  Filled 2020-02-19: qty 100

## 2020-02-19 MED ORDER — LORAZEPAM 2 MG/ML IJ SOLN: 1.0000 mg | INTRAMUSCULAR | Status: DC | PRN

## 2020-02-19 MED ORDER — HALOPERIDOL LACTATE 5 MG/ML IJ SOLN: 2.0000 mg | Freq: Four times a day (QID) | INTRAMUSCULAR | Status: DC | PRN

## 2020-02-19 MED ORDER — LORAZEPAM 2 MG/ML IJ SOLN: 0.5000 mg | INTRAMUSCULAR | Status: DC | PRN

## 2020-02-19 MED ORDER — MORPHINE SULFATE (PF) 2 MG/ML IV SOLN
2.0000 mg | INTRAVENOUS | Status: DC | PRN
  Administered 2020-02-19: 2 mg via INTRAVENOUS
  Filled 2020-02-19: qty 1

## 2020-02-19 MED ORDER — MORPHINE BOLUS VIA INFUSION
1.0000 mg | INTRAVENOUS | Status: DC | PRN
  Administered 2020-02-19: 1 mg via INTRAVENOUS
  Filled 2020-02-19: qty 2

## 2020-02-19 MED ORDER — MORPHINE 100MG IN NS 100ML (1MG/ML) PREMIX INFUSION
1.0000 mg/h | INTRAVENOUS | Status: DC
  Administered 2020-02-19: 1 mg/h via INTRAVENOUS
  Filled 2020-02-19: qty 100

## 2020-02-21 LAB — CULTURE, BLOOD (ROUTINE X 2)
Culture: NO GROWTH
Culture: NO GROWTH
Special Requests: ADEQUATE
Special Requests: ADEQUATE

## 2020-03-03 ENCOUNTER — Encounter: Payer: Medicare Other | Admitting: Nutrition

## 2020-03-08 NOTE — Progress Notes (Signed)
Patient deceased. Time of death 07/23/2231 02-26-2020 pronounced by Teola Bradley RN and myself, Salley Hews RN. Needle removed from pts port and disposed of in sharps container. Pts sister, Jetty Duhamel notified. Dr. Marlowe Sax notified. Pts sister and niece currently at the bedside.  Adella Hare, RN

## 2020-03-08 NOTE — Progress Notes (Signed)
Pharmacy Antibiotic Note  Ivan Daniels is a 74 y.o. male  with pneumonia.  Pharmacy has been consulted for vancomycin dosing (he is also on zosyn) due to worsening respiratory distress .  -WBC= 4.5, afebrile -SCr= 1> 1.4, CrCl ~ 45  Plan: -change vancomycin to 500mg  IV q12h -Will follow renal function, cultures and clinical progress   Height: 5\' 11"  (180.3 cm) Weight: 66.7 kg (147 lb 0.8 oz) IBW/kg (Calculated) : 75.3  Temp (24hrs), Avg:99.2 F (37.3 C), Min:98.2 F (36.8 C), Max:99.7 F (37.6 C)  Recent Labs  Lab 02/17/2020 1240 02/07/2020 2130 02/14/20 0355 02/14/20 0355 02/15/20 0400 02/15/20 0400 03/06/2020 0315 02/17/20 0149 02/18/20 0345 02/18/20 1534 2020/02/23 0426  WBC  --    < > 4.3  --  4.7  --  4.3 4.6 4.5  --   --   CREATININE  --    < > 0.76   < > 0.85   < > 0.86 1.12 1.01 1.09 1.40*  LATICACIDVEN 1.0  --   --   --   --   --   --   --   --   --   --    < > = values in this interval not displayed.    Estimated Creatinine Clearance: 44.3 mL/min (A) (by C-G formula based on SCr of 1.4 mg/dL (H)).    Allergies  Allergen Reactions  . Benztropine Mesylate     REACTION: decrease ROM neck. Pt states he is not allergic   . Chlorpromazine Hcl     Unknown/ per pt causes him to draw up    Antimicrobials this admission: 10/11 zosyn 10/13 vanc  Dose adjustments this admission:   Microbiology results: 10/11 blood x2  Thank you for allowing pharmacy to be a part of this patient's care.  Hildred Laser, PharmD Clinical Pharmacist **Pharmacist phone directory can now be found on [email or web address] ([email or web address]).  Listed under [place].

## 2020-03-08 NOTE — Progress Notes (Signed)
PROGRESS NOTE    Ivan Daniels  RSW:[id number] DOB: 11-06-1945 DOA: 03/07/2020 PCP: Ivan Broad, Ivan Daniels    Brief Narrative:  Ivan Daniels was admitted with the working diagnosis of right lower lobe aspiration pneumonia complicated with right empyema.  74 year old male with past medical history for non-small cell lung cancer(on chemotherapy), reported nausea, vomiting and generalized weakness for 2 weeks. Significant decrease in p.o. intake, due to rapid and progressive deconditioning he was brought to the hospital by his sister. On his initial physical examination blood pressure 128/68, heart rate 121, respiratory rate 34, oxygen saturation 93%, heart S1-S2, present, tachycardic, lungs with decreased breath sounds, mainly at the right side, abdomen soft, no lower extremity edema. Sodium 132, potassium 3.4, chloride 99, bicarb 22, glucose 97, BUN 9, creatinine 0.92, white count 4.9, hemoglobin 10.1, hematocrit 31.6, platelets 312. SARS COVID-19 negative. Urinalysis negative for infection. Chest radiograph with right pleural effusion, right lower lobe opacity, right upper lobe infiltrate. EKG 125 bpm, normal axis, normal intervals, sinus rhythm, no ST segment or T wave changes.  Patient was placed on antibiotic therapy and supplemental 02 per [place]. Speech evaluation recommended dysphagia 3 diet and possible further GI evaluation for esophageal stricture.  Unsuccessful thoracentesis, likely loculated effusion. Consulted CT surgery with recommendations to transfer from [facility] to [facility] for possible surgical intervention VATS.  Patient transferred to [facility] on 10/11.Changed antibiotic therapy to Zosyn due to fever. CT surgery recommended IR CT guided drainage of right loculated effusion.   10/13 noted to have worsening increase work of breathing and evident dyspnea. Placed on non invasive mechanical ventilation, Bipap.    Assessment & Plan:   Principal Problem:   Right lower lobe  pneumonia Active Problems:   Paranoid schizophrenia ([place])   Non-small cell lung cancer ([place])   Pulmonary emphysema ([facility])   Dysphagia   Esophageal stricture   Intractable nausea and vomiting   Aspiration into airway   Empyema ([place])   1. Right lower lobe aspiration pneumonia, community acquired (present on amdission), complicated with right loculated effusion possible empyema/  sepsis (not present on admission). Yesterday patient with worsening work of breathing and required non invasive mechanical ventilation Bipap, 12/6 Fi02 40%, making Vt 400 cc.  This am continue with respiratory distress with tachypnea 38 to 40 bpm and tachycardia 130 bpm, he is less reactive.  Follow up chest film yesterday PM with bilateral infiltrates at lower lobes stable right pleural effusion, mass at the right lower lobe.  No much improvement after diuresis with furosemide  Considering patient's condition and loculated nature of effusion CT guided drainage will not affect much patient's respiratory distress and certainly a pneumothorax will trigger a rapid decline in his condition.   Continue antibiotic therapy with Zosyn and Vancomycin, will repeat chest film this and continue with Bipap support. As needed morphine for severe pain or dyspnea.   Patient less reactive due to acute metabolic encephalopathy. Will consult palliative care services, patient has a poor prognosis and has imminent risk of worsening.   2. COPD with no exacerbation. Continue with as needed bronchodilator therapy.    3. 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 Old records personally reviewed from office visit to oncology on 10/06. Recurrent disease, right upper lobe mass. On chemotherapy as outpatient.   Worsening respiratory failure, very poor prognosis, will call palliative care team.   4. AKI with Hypokalemia. Worsening renal function with serum cr up to 1,40 with K at  3,9  and bicarbonate at 24.  Holding IV fluids for now due to suspected acute non cardiogenic pulmonary edema.  After furosemide urine output 3,900 ml with no improvement in respiratory status.   5. Swallow dysfunction with possible esophageal stricture. Patient critically ill. Now encephalopathy and not poorly responsive.    6. Schizophrenia.Not able to take po medications due to critical condition.   Patient continue to be at high risk for worsening respiratory failure   Status is: Inpatient  Remains inpatient appropriate because:IV treatments appropriate due to intensity of illness or inability to take PO   Dispo: The patient is from: Home              Anticipated d/c is to: Home              Anticipated d/c date is: 2 days              Patient currently is not medically stable to d/c.  DVT prophylaxis: Enoxaparin   Code Status:   DNR   Family Communication:  I spoke with patient's sister at the bedside, we talked in detail about patient's condition, plan of care and prognosis and all questions were addressed. Patient critically ill with very poor prognosis, will call palliative care.      Nutrition Status: Nutrition Problem: Increased nutrient needs Etiology: catabolic illness, cancer and cancer related treatments Signs/Symptoms: estimated needs Interventions: Boost Breeze, MVI, Other (Comment) ([employer])     Skin Documentation:     Consultants:   IR  CT surgery   Palliative care   Antimicrobials:   Vancomycin and Zosyn     Subjective: Patient in respiratory distress, less reactive now on full face mask bipap.   Objective: Vitals:   03-02-20 0250 2020-03-02 0300 03/02/20 0400 03/02/2020 0738  BP:  114/62 105/64 125/73  Pulse: (!) 129 (!) 132 (!) 130 (!) 133  Resp: (!) 35 (!) 39 (!) 38 (!) 42  Temp:   98.8 F (37.1 C) 99.8 F (37.7 C)  TempSrc:   Axillary Axillary  SpO2: 100% 100% 99% 99%  Weight:      Height:        Intake/Output Summary (Last  24 hours) at March 02, 2020 0758 Last data filed at 02-Mar-2020 5631 Gross per 24 hour  Intake 1829.61 ml  Output 3900 ml  Net -2070.39 ml   Filed Weights   02/11/2020 2050  Weight: 66.7 kg    Examination:   General: deconditioned and ill looking appearing  Neurology: opens eyes to touch but not follows commands, not able to speak, he is on full face mask bipap. E ENT: mild pallor, positive accessory respiratory muscle use.  Cardiovascular: No JVD. S1-S2 present, rhythmic, no gallops, rubs, or murmurs. No lower extremity edema. Pulmonary: positive breath sounds bilaterally,no wheezing, but positive bilateral rhonchi and rales. Gastrointestinal. Abdomen soft and non tender Skin. No rashes Musculoskeletal: no joint deformities     Data Reviewed: I have personally reviewed following labs and imaging studies  CBC: Recent Labs  Lab 02/14/20 0355 02/15/20 0400 03/01/2020 0315 02/17/20 0149 02/18/20 0345  WBC 4.3 4.7 4.3 4.6 4.5  NEUTROABS  --   --   --   --  2.3  HGB 9.7* 9.7* 10.0* 10.1* 9.5*  HCT 29.9* 30.0* 30.8* 32.3* 30.5*  MCV 87.7 87.2 87.7 87.1 87.4  PLT 291 285 321 291 497   Basic Metabolic Panel: Recent Labs  Lab 02/15/2020 2130 02/13/20 0333 02/26/2020 0315 02/17/20 0149 02/18/20 0345  02/18/20 1534 2020/02/28 0426  NA  --    < > 136 136 135 135 136  K  --    < > 3.4* 4.1 3.6 4.2 3.9  CL  --    < > 100 103 102 100 96*  CO2  --    < > 27 23 25 24 24   GLUCOSE  --    < > 125* 104* 121* 109* 83  BUN  --    < > 7* 6* 5* 5* 9  CREATININE 0.85   < > 0.86 1.12 1.01 1.09 1.40*  CALCIUM  --    < > 8.7* 9.0 8.5* 8.8* 9.3  MG 1.9  --  1.8  --   --   --   --    < > = values in this interval not displayed.   GFR: Estimated Creatinine Clearance: 44.3 mL/min (A) (by C-G formula based on SCr of 1.4 mg/dL (H)). Liver Function Tests: Recent Labs  Lab 02/13/20 0333 02/14/20 0355 02/15/20 0400 02/14/2020 0315 02/17/20 0149  AST 33 33 35 34 33  ALT 22 20 19 21 18   ALKPHOS 30*  33* 33* 35* 33*  BILITOT 1.0 0.8 0.8 0.7 0.7  PROT 5.8* 5.9* 5.9* 6.2* 5.9*  ALBUMIN 2.6* 2.6* 2.5* 2.6* 2.1*   No results for input(s): LIPASE, AMYLASE in the last 168 hours. No results for input(s): AMMONIA in the last 168 hours. Coagulation Profile: No results for input(s): INR, PROTIME in the last 168 hours. Cardiac Enzymes: No results for input(s): CKTOTAL, CKMB, CKMBINDEX, TROPONINI in the last 168 hours. BNP (last 3 results) No results for input(s): PROBNP in the last 8760 hours. HbA1C: No results for input(s): HGBA1C in the last 72 hours. CBG: Recent Labs  Lab 03/02/2020 1051 02/18/20 1823  GLUCAP 82 104*   Lipid Profile: No results for input(s): CHOL, HDL, LDLCALC, TRIG, CHOLHDL, LDLDIRECT in the last 72 hours. Thyroid Function Tests: No results for input(s): TSH, T4TOTAL, FREET4, T3FREE, THYROIDAB in the last 72 hours. Anemia Panel: No results for input(s): VITAMINB12, FOLATE, FERRITIN, TIBC, IRON, RETICCTPCT in the last 72 hours.    Radiology Studies: I have reviewed all of the imaging during this hospital visit personally     Scheduled Meds: . chlorhexidine  15 mL Mouth Rinse BID  . Chlorhexidine Gluconate Cloth  6 each Topical Daily  . enoxaparin (LOVENOX) injection  40 mg Subcutaneous Q24H  . haloperidol  5 mg Oral QHS  . latanoprost  1 drop Both Eyes QHS  . mouth rinse  15 mL Mouth Rinse q12n4p  . pantoprazole (PROTONIX) IV  40 mg Intravenous Q12H  . umeclidinium-vilanterol  1 puff Inhalation Daily   Continuous Infusions: . piperacillin-tazobactam (ZOSYN)  IV 3.375 g (February 28, 2020 0625)  . vancomycin       LOS: 7 days        Cortne Amara Gerome Apley, MD

## 2020-03-08 NOTE — Progress Notes (Signed)
Palliative care contacted, awaiting consult. Patient continues to decline with a change in MEWs score. Physician aware of status and is in contact with the family of the patient. Bipap on and functioning. We will continue to monitor.  Donnelly Angelica, RN

## 2020-03-08 NOTE — Death Summary Note (Signed)
Death Summary  Ivan Daniels EGB:[id number] DOB: [birth year] DOA: 02-14-20  PCP: [name], FNP  Admit date: February 14, 2020 Date of Death: 02/22/2020 Time of Death: 22:33 Notification: [name], FNP notified of death of February 22, 2020   History of present illness:  Ivan Daniels is a 74 y.o. Daniels with a history of non small cell lung cancer, COPD with emphysema, esophageal stricture, swallow dysfunction, and paranoid schizophrenia.  Ivan Daniels presented with complaint of nausea, vomiting and generalized weakness for 2 weeks. Ivan Daniels did not improve after aggressive medical therapy, including broad spectrum antibiotic therapy and non invasive mechanical ventilation.   Ivan Daniels was admitted with the working diagnosis of right lower lobe aspiration pneumonia complicated with right empyema.  74 year old Daniels with past medical history for non-small cell lung cancer(on chemotherapy), reported nausea, vomiting and generalized weakness for 2 weeks. Significant decrease in p.o. intake, due to rapid and progressive deconditioning he was brought to the hospital by his sister. On his initial physical examination blood pressure 128/68, heart rate 121, respiratory rate 34, oxygen saturation 93%, heart S1-S2, present, tachycardic, lungs with decreased breath sounds, mainly at the right side, abdomen soft, no lower extremity edema. Sodium 132, potassium 3.4, chloride 99, bicarb 22, glucose 97, BUN 9, creatinine 0.92, white count 4.9, hemoglobin 10.1, hematocrit 31.6, platelets 312. SARS COVID-19 negative. Urinalysis negative for infection. Chest radiograph with right pleural effusion, right lower lobe opacity, right upper lobe infiltrate. EKG 125 bpm, normal axis, normal intervals, sinus rhythm, no ST segment or T wave changes.  Ivan was placed on antibiotic therapy and supplemental 02 per [facility]. Speech evaluation recommended dysphagia 3 diet and possible further  GI evaluation for esophageal stricture.  Unsuccessful ultrasound guided thoracentesis, likely due to loculated effusion. Consulted CT surgery with recommendations to transfer from [facility] to [facility] for possible surgical intervention VATS.  Ivan transferred to [facility] on 10/11.Changed antibiotic therapy to Zosyn due to fever. CT surgery recommended IR CT guided drainage of right loculated effusion.  10/13 noted to have worsening increase work of breathing and evident dyspnea.Placed on non invasive mechanical ventilation, Bipap.   Despite non invasive mechanica ventilation, broad spectrum antibiotic therapy, and diuresis, Ivan continue to deteriorate, persistent increase work of breathing and respiratory distress. His mentation worsening and became poorly responsive.   Palliative care service was consulted.   Considering poor prognosis and severe respiratory distress Ivan was placed on continuous IV infusion of morphine, non invasive mechanical ventilator was removed and Ivan was allowed to have a natural death.   Final Diagnoses:  1. Right lower lobe aspiration pneumonia, community acquired (present on amdission), complicated with right loculated effusion possible empyema/  sepsis (not present on admission). 2. COPD with no exacerbation 3. Non small cell lung cancerstage 3a/ anemia of malignancy/ severe physical functional deconditioning. 4. AKI with Hypokalemia 5. Swallow dysfunction with possible esophageal stricture. 6. Schizophrenia  The results of significant diagnostics from this hospitalization (including imaging, microbiology, ancillary and laboratory) are listed below for reference.    Significant Diagnostic Studies: DG Chest 1 View  Result Date: 02/18/2020 CLINICAL DATA:  Shortness of breath EXAM: CHEST  1 VIEW COMPARISON:  February 15, 2020 chest radiograph. Chest CT February 14, 2020 FINDINGS: Port-A-Cath tip in superior vena cava, unchanged. The previously noted masslike  consolidation in the right upper lung region with volume loss on the right is stable. There may well be a degree of radiation therapy change in this area. Right pleural effusion stable.  On the left, there is ill-defined airspace opacity and a degree of pleural effusion. Heart size normal. Distortion of pulmonary vascularity on the right. Pulmonary vascular on the left within normal limits. No adenopathy appreciable by radiography. No bone lesions. IMPRESSION: Stable appearing chest compared to most recent study. Port-A-Cath position unchanged. Masslike consolidation with retraction right upper lobe with volume loss throughout much of the right lung. Stable right pleural effusion. Small left pleural effusion noted with ill-defined airspace opacity, likely pneumonia, in the left base, similar to recent study. Stable cardiac silhouette. No retraction in the right paratracheal region, stable. Electronically Signed   By: Lowella Grip III M.D.   On: 02/18/2020 15:39   DG Chest 1 View  Result Date: 02/15/2020 CLINICAL DATA:  Status post thoracentesis. EXAM: CHEST  1 VIEW COMPARISON:  Chest CT 02/14/2020; chest radiograph 02/14/2020 FINDINGS: Stabled Port-A-Cath. Similar-appearing masslike consolidation right upper hemithorax. Mild interval decrease in size of moderate loculated right pleural effusion. Similar bibasilar heterogeneous opacities. No definite pneumothorax. IMPRESSION: Mild interval decrease in size of moderate loculated right pleural effusion. No pneumothorax. Similar bilateral lower lung airspace opacities. Electronically Signed   By: Lovey Newcomer M.D.   On: 02/15/2020 13:39   DG Abd 1 View  Result Date: 02/18/2020 CLINICAL DATA:  Decreased appetite EXAM: ABDOMEN - 1 VIEW COMPARISON:  February 07, 2020 FINDINGS: Relatively mild volume of stool. Postoperative change in lower abdomen. No bowel dilatation or air-fluid level to suggest bowel obstruction. No free air. Vascular calcifications noted in  the pelvis. Visualized lung bases clear. IMPRESSION: No bowel obstruction or free air on supine examination. Postoperative changes noted. Electronically Signed   By: Lowella Grip III M.D.   On: 02/18/2020 14:24   DG Abdomen 1 View  Result Date: 02/07/2020 CLINICAL DATA:  Constipation. EXAM: ABDOMEN - 1 VIEW COMPARISON:  None. FINDINGS: No abnormal bowel dilatation is noted. Moderate amount of stool is noted throughout the colon. No radio-opaque calculi or other significant radiographic abnormality are seen. IMPRESSION: Moderate stool burden. No evidence of bowel obstruction or ileus. Electronically Signed   By: Marijo Conception M.D.   On: 02/07/2020 10:48   CT CHEST W CONTRAST  Result Date: 02/14/2020 CLINICAL DATA:  Progressive consolidation despite antibiotic therapy, lung cancer, pneumonia EXAM: CT CHEST WITH CONTRAST TECHNIQUE: Multidetector CT imaging of the chest was performed during intravenous contrast administration. CONTRAST:  36mL OMNIPAQUE IOHEXOL 300 MG/ML  SOLN COMPARISON:  12/30/2019, chest radiographs 03/03/2020 and 02/14/2020 FINDINGS: Cardiovascular: Post radiation changes are noted involving the right upper lobe with attenuation of the right upper lobe pulmonary arterial vasculature. The central pulmonary arteries are of normal caliber and are otherwise unremarkable. Moderate coronary artery calcification. Global cardiac size within normal limits. No pericardial effusion. The thoracic aorta is of normal caliber. Minimal atherosclerotic calcification is seen within the descending thoracic aorta and proximal arch vasculature. Right internal jugular chest port is seen with its tip within the right atrium. Mediastinum/Nodes: Thyroid unremarkable. No pathologic thoracic adenopathy. Esophagus unremarkable. Lungs/Pleura: Post radiation changes are noted within the right upper lobe with dense consolidation again noted. Additionally, there is more nodular soft tissue noted within the central  right middle lobe, best appreciated on axial image # 53/2, which appears stable since prior examination and is suspicious for residual disease. There are, however, superimposed airspace infiltrates noted within the peripheral right apex as well as within the lingula and within the lateral segment of the left lower lobe in keeping with multifocal pneumonic infiltrate,  new since prior examination. Additionally, there has developed moderate bilateral pleural effusions, right greater than left. Multiple apical blebs are seen bilaterally. There is no pneumothorax. There is debris within the right upper lobe bronchus, best appreciated on axial image # 45/5. No central obstructing mass, however, is identified. Upper Abdomen: Multiple cysts are seen scattered throughout the liver. Limited images of the upper abdomen are otherwise unremarkable. Musculoskeletal: No acute bone abnormality. IMPRESSION: Multifocal pulmonary infiltrates in keeping with atypical infection in the appropriate clinical setting. Post radiation changes within the right upper lobe with more nodular soft tissue again identified within the right middle lobe centrally suspicious for residual disease. This likely represents the residual mass noted on prior PET CT examination of 10/03/2019. There is, however, no central obstructing mass and the central airways are patent, though debris-filled within the right upper lobe. Moderate bilateral pleural effusions, right greater than left. Aortic Atherosclerosis (ICD10-I70.0). Electronically Signed   By: Fidela Salisbury MD   On: 02/14/2020 19:27   MR Brain W and Wo Contrast  Result Date: 01/26/2020 CLINICAL DATA:  Metastatic disease evaluation, dizziness and nausea. EXAM: MRI HEAD WITHOUT AND WITH CONTRAST TECHNIQUE: Multiplanar, multiecho pulse sequences of the brain and surrounding structures were obtained without and with intravenous contrast. CONTRAST:  30mL GADAVIST GADOBUTROL 1 MMOL/ML IV SOLN COMPARISON:   01/27/2015 MRI head. FINDINGS: Please note image quality is degraded by motion artifact, limiting evaluation. Brain: No acute infarct or intracranial hemorrhage. No midline shift, ventriculomegaly or extra-axial fluid collection. No mass lesion. No abnormal enhancement. Moderate cerebral atrophy with ex vacuo dilatation. Minimal chronic microvascular ischemic changes. Vascular: Major intracranial flow voids are grossly preserved. Left frontal developmental venous anomaly, unchanged. Skull and upper cervical spine: No focal lesions. Sinuses/Orbits: Motion artifact limits evaluation of the orbits. Clear paranasal sinuses and mastoid air cells. Other: None. IMPRESSION: No acute intracranial process. No evidence of intracranial metastases. Moderate cerebral atrophy and minimal chronic microvascular ischemic changes. Motion degraded exam. Electronically Signed   By: Primitivo Gauze M.D.   On: 01/26/2020 11:49   [place] CHEST (PLEURAL EFFUSION)  Result Date: 02/15/2020 CLINICAL DATA:  74 year old Daniels with right-sided pleural effusion. EXAM: CHEST ULTRASOUND COMPARISON:  None. FINDINGS: There is a small right pleural effusion. IMPRESSION: Small right pleural effusion. Electronically Signed   By: Anner Crete M.D.   On: 02/15/2020 17:05   DG CHEST PORT 1 VIEW  Result Date: 02/14/2020 CLINICAL DATA:  Ivan with history of lung cancer. Nausea and vomiting. EXAM: PORTABLE CHEST 1 VIEW COMPARISON:  Chest radiograph 03/05/2020. FINDINGS: Right anterior chest wall Port-A-Cath is present with tip projecting over the superior vena cava. Stable cardiac and mediastinal contours. Persistent masslike opacity right upper hemithorax. Slight interval increase in moderate right pleural effusion. Increased bilateral mid lower lung airspace opacities. IMPRESSION: 1. Interval increase in size of moderate right pleural effusion. 2. Increasing bilateral mid and lower lung airspace opacities which may represent infection in the  appropriate clinical setting. Electronically Signed   By: Lovey Newcomer M.D.   On: 02/14/2020 15:26   DG Chest Portable 1 View  Result Date: 03/06/2020 CLINICAL DATA:  Tachycardia.  Lung cancer EXAM: PORTABLE CHEST 1 VIEW COMPARISON:  02/07/2020, 01/26/2020 FINDINGS: Right-sided Port-A-Cath remains in place. Normal heart size. Irregular masslike density within the right upper lobe, grossly unchanged compared to priors. Interval development of hazy opacity within the right lung base and small right pleural effusion. No pneumothorax. IMPRESSION: 1. Interval development of hazy opacity within the right lung  base and small right pleural effusion. Findings may represent atelectasis, aspiration and/or pneumonia. 2. Grossly stable appearance of right upper lobe mass. Electronically Signed   By: Davina Poke D.O.   On: 02/11/2020 11:43   DG Chest Port 1 View  Result Date: 02/07/2020 CLINICAL DATA:  Cough, weakness. EXAM: PORTABLE CHEST 1 VIEW COMPARISON:  January 26, 2020. FINDINGS: Stable cardiac size. No pneumothorax is noted. Right internal jugular Port-A-Cath is unchanged in position. Stable irregular masslike density is noted in right upper lobe. Left lung is clear. Bony thorax is unremarkable. IMPRESSION: Stable irregular masslike density is noted in right upper lobe concerning for neoplasm. No acute abnormality is noted. Electronically Signed   By: Marijo Conception M.D.   On: 02/07/2020 10:30   DG Chest Port 1 View  Result Date: 01/26/2020 CLINICAL DATA:  Dyspnea.  History of lung cancer EXAM: PORTABLE CHEST 1 VIEW COMPARISON:  12/09/2019 FINDINGS: Right upper lobe masslike density stable.  Remaining lungs are clear Heart size and pulmonary vascularity normal. Port-A-Cath in the SVC unchanged. IMPRESSION: Right upper lobe mass lesion stable.  No acute findings. Electronically Signed   By: Franchot Gallo M.D.   On: 01/26/2020 09:43   ECHOCARDIOGRAM COMPLETE  Result Date: 02/14/2020    ECHOCARDIOGRAM  REPORT   Ivan Daniels Date of Exam: 02/14/2020 Medical Rec #:  [id number]       Height:       71.0 in Accession #:    [id number]      Weight:       147.0 lb Date of Birth:  January 08, 1946      BSA:          1.850 m Ivan Age:    Ivan years        BP:           115/61 mmHg Ivan Gender: M               HR:           120 bpm. Exam Location:  Inpatient Procedure: 2D Echo Indications:    428.21 CHF  History:        Ivan has no prior history of Echocardiogram examinations.                 Risk Factors:Dyslipidemia and Former Smoker. ETOH Abuse.                 emphysema of lung. lung cancer. hx of pneumothorax (2016).                 tobacco abuse hx.  Sonographer:    Jannett Celestine RDCS (AE) Referring Phys: [id number] STEPHEN K CHIU  Sonographer Comments: Technically difficult study due to poor echo windows, no parasternal window and no apical window. Image acquisition challenging due to respiratory motion. IMPRESSIONS  1. Technically difficult echo with very poor image quality.  2. Left ventricular ejection fraction, by estimation, is 55 to 60%. The left ventricle has normal function. The left ventricle has no regional wall motion abnormalities. There is not well visualized left ventricular hypertrophy. Left ventricular diastolic function could not be evaluated.  3. Right ventricular systolic function was not well visualized. The right ventricular size is not well visualized.  4. A small pericardial effusion is present.  5. The mitral valve was not well visualized. No evidence of mitral valve regurgitation.  6. The aortic valve was not well visualized. Aortic valve regurgitation is not visualized. FINDINGS  Left Ventricle: Left ventricular ejection fraction, by estimation, is 55 to 60%. The left ventricle has normal function. The left ventricle has no regional wall motion abnormalities. The left ventricular internal cavity size was normal in size. There is  not well visualized left ventricular hypertrophy.  Left ventricular diastolic function could not be evaluated. Right Ventricle: The right ventricular size is not well visualized. Right vetricular wall thickness was not well visualized. Right ventricular systolic function was not well visualized. Left Atrium: Left atrial size was not well visualized. Right Atrium: Right atrial size was not well visualized. Pericardium: A small pericardial effusion is present. Mitral Valve: The mitral valve was not well visualized. No evidence of mitral valve regurgitation. Tricuspid Valve: The tricuspid valve is not well visualized. Tricuspid valve regurgitation is not demonstrated. Aortic Valve: The aortic valve was not well visualized. Aortic valve regurgitation is not visualized. Pulmonic Valve: The pulmonic valve was not well visualized. Pulmonic valve regurgitation is not visualized. Aorta: The aortic root was not well visualized. IAS/Shunts: The interatrial septum was not well visualized. Additional Comments: Technically difficult echo with very poor image quality. Mertie Moores MD Electronically signed by Mertie Moores MD Signature Date/Time: 02/14/2020/4:20:53 PM    Final    DG ESOPHAGUS W SINGLE CM (SOL OR THIN BA)  Result Date: 02/28/2020 CLINICAL DATA:  Dysphagia, history of lung cancer EXAM: Suggestion of fold thickening, moderately large hiatal hernia ESOPHOGRAM/BARIUM SWALLOW TECHNIQUE: Single contrast examination was performed using  thin barium. FLUOROSCOPY TIME:  Fluoroscopy Time:  3 minutes 7 seconds Radiation Exposure Index (if provided by the fluoroscopic device): 20.2 mGy Number of Acquired Spot Images: 0 COMPARISON:  CT chest from 02/14/2020 FINDINGS: The study is very limited due to difficulty in positioning of the Ivan and given that the Ivan is not able to swallow much more than a small amount of liquid at the time even with encouragement and repeated attempts. Upon swallowing of thin barium the esophagus shows diffuse fold thickening which is not  well evaluated on single contrast evaluation. There is a moderate size hiatal hernia. Motility could not be well assessed nor could luminal caliber due to the limited volume the could be ingested in given moment during the exam. Suggestion of some narrowing of the CP segment of the esophagus though this appears smooth on the LPO projection and could not be well assessed due to difficulties in Ivan positioning and overlapping structures on the the RPO projection best seen on image 21 of series 4. Limited distensibility of the esophagus diffusely particularly distally is suggested with potential pulsion diverticulum along the distal esophagus as well. On the final series even with head of bed elevated there is gross reflux into the distal esophagus from the stomach and into the mid and proximal esophagus as well. IMPRESSION: 1. Limited esophageal assessment due to limited ability to ingest and a barium to distend the esophagus adequately. 2. Suggestion of fold thickening on single contrast evaluation with moderate size hiatal hernia and evidence of reflux. 3. Question of narrowing at the proximal portion of the esophagus, cricopharyngeal segment, not well assessed. Electronically Signed   By: Zetta Bills M.D.   On: 02/13/2020 13:19    Microbiology: Recent Results (from the past 240 hour(s))  Blood culture (routine x 2)     Status: None   Collection Time: 02/11/2020 12:40 PM   Specimen: BLOOD  Result Value Ref Range Status   Specimen Description   Final    BLOOD LEFT ANTECUBITAL Performed at  [facility], [place] [address] [place], [place] [address]    Special Requests   Final    BOTTLES DRAWN AEROBIC AND ANAEROBIC Blood Culture adequate volume Performed at [facility] [address] [place], [place] [address]    Culture   Final    NO GROWTH 5 DAYS Performed at [facility], [place] [address], [place], [place] [address]    Report Status 02/17/2020 FINAL   Final  Blood culture (routine x 2)     Status: None   Collection Time: 03/01/2020 12:50 PM   Specimen: BLOOD  Result Value Ref Range Status   Specimen Description   Final    BLOOD RIGHT ANTECUBITAL Performed at [facility] [address] [place], [place] [address]    Special Requests   Final    BOTTLES DRAWN AEROBIC AND ANAEROBIC Blood Culture adequate volume   Culture   Final    NO GROWTH 5 DAYS Performed at [facility], [place] [address], [place], [place] [address]    Report Status 02/17/2020 FINAL  Final  Respiratory Panel by RT PCR (Flu A&B, Covid) - Nasopharyngeal Swab     Status: None   Collection Time: 03/06/2020 12:55 PM   Specimen: Nasopharyngeal Swab  Result Value Ref Range Status   SARS Coronavirus 2 by RT PCR NEGATIVE NEGATIVE Final    Comment: (NOTE) SARS-CoV-2 target nucleic acids are NOT DETECTED.  The SARS-CoV-2 RNA is generally detectable in upper respiratoy specimens during the acute phase of infection. The lowest concentration of SARS-CoV-2 viral copies this assay can detect is 131 copies/mL. A negative result does not preclude SARS-Cov-2 infection and should not be used as the sole basis for treatment or other Ivan management decisions. A negative result may occur with  improper specimen collection/handling, submission of specimen other than nasopharyngeal swab, presence of viral mutation(s) within the areas targeted by this assay, and inadequate number of viral copies (<131 copies/mL). A negative result must be combined with clinical observations, Ivan history, and epidemiological information. The expected result is Negative.  Fact Sheet for Patients:  PinkCheek.be  Fact Sheet for Healthcare Providers:  GravelBags.it  This test is no t yet approved or cleared by the [place] FDA and  has been authorized for detection and/or diagnosis of SARS-CoV-2 by FDA under  an Emergency Use Authorization (EUA). This EUA will remain  in effect (meaning this test can be used) for the duration of the COVID-19 declaration under Section 564(b)(1) of the Act, 21 U.S.C. section 360bbb-3(b)(1), unless the authorization is terminated or revoked sooner.     Influenza A by PCR NEGATIVE NEGATIVE Final   Influenza B by PCR NEGATIVE NEGATIVE Final    Comment: (NOTE) The Xpert Xpress SARS-CoV-2/FLU/RSV assay is intended as an aid in  the diagnosis of influenza from Nasopharyngeal swab specimens and  should not be used as a sole basis for treatment. Nasal washings and  aspirates are unacceptable for Xpert Xpress SARS-CoV-2/FLU/RSV  testing.  Fact Sheet for Patients: PinkCheek.be  Fact Sheet for Healthcare Providers: GravelBags.it  This test is not yet approved or cleared by the [place] FDA and  has been authorized for detection and/or diagnosis of SARS-CoV-2 by  FDA under an Emergency Use Authorization (EUA). This EUA will remain  in effect (meaning this test can be used) for the duration of the  Covid-19 declaration under Section 564(b)(1) of the Act, 21  U.S.C. section 360bbb-3(b)(1), unless the authorization  is  terminated or revoked. Performed at [facility], [place] [address], [place], [place] [address]   MRSA PCR Screening     Status: None   Collection Time: 02/14/20 10:27 AM   Specimen: Nasopharyngeal  Result Value Ref Range Status   MRSA by PCR NEGATIVE NEGATIVE Final    Comment:        The GeneXpert MRSA Assay (FDA approved for NASAL specimens only), is one component of a comprehensive MRSA colonization surveillance program. It is not intended to diagnose MRSA infection nor to guide or monitor treatment for MRSA infections. Performed at [facility], [place] [address], [place], [place] [address]   Culture, blood (Routine X 2) w Reflex to ID Panel      Status: None (Preliminary result)   Collection Time: 02/13/2020  6:39 PM   Specimen: BLOOD RIGHT HAND  Result Value Ref Range Status   Specimen Description BLOOD RIGHT HAND  Final   Special Requests   Final    BOTTLES DRAWN AEROBIC ONLY Blood Culture adequate volume   Culture   Final    NO GROWTH 4 DAYS Performed at [facility], [place] [address], [place], [place] [address]    Report Status PENDING  Incomplete  Culture, blood (Routine X 2) w Reflex to ID Panel     Status: None (Preliminary result)   Collection Time: 02/11/2020  6:39 PM   Specimen: BLOOD LEFT HAND  Result Value Ref Range Status   Specimen Description BLOOD LEFT HAND  Final   Special Requests   Final    BOTTLES DRAWN AEROBIC ONLY Blood Culture adequate volume   Culture   Final    NO GROWTH 4 DAYS Performed at [facility], [place] [address], [place], [place] [address]    Report Status PENDING  Incomplete     Labs: Basic Metabolic Panel: Recent Labs  Lab 02/25/2020 0315 03/03/2020 0315 02/17/20 0149 02/17/20 0149 02/18/20 0345 02/18/20 0345 02/18/20 1534 Mar 16, 2020 0426  NA 136  --  136  --  135  --  135 136  K 3.4*   < > 4.1   < > 3.6   < > 4.2 3.9  CL 100  --  103  --  102  --  100 96*  CO2 27  --  23  --  25  --  24 24  GLUCOSE 125*  --  104*  --  121*  --  109* 83  BUN 7*  --  6*  --  5*  --  5* 9  CREATININE 0.86  --  1.12  --  1.01  --  1.09 1.40*  CALCIUM 8.7*  --  9.0  --  8.5*  --  8.8* 9.3  MG 1.8  --   --   --   --   --   --   --    < > = values in this interval not displayed.   Liver Function Tests: Recent Labs  Lab 02/14/20 0355 02/15/20 0400 02/10/2020 0315 02/17/20 0149  AST 33 35 34 33  ALT 20 19 21 18   ALKPHOS 33* 33* 35* 33*  BILITOT 0.8 0.8 0.7 0.7  PROT 5.9* 5.9* 6.2* 5.9*  ALBUMIN 2.6* 2.5* 2.6* 2.1*   No results for input(s): LIPASE, AMYLASE in the last 168 hours. No results for input(s): AMMONIA in the last 168 hours. CBC: Recent Labs  Lab 02/14/20 0355 02/15/20 0400  02/11/2020 0315 02/17/20  0149 02/18/20 0345  WBC 4.3 4.7 4.3 4.6 4.5  NEUTROABS  --   --   --   --  2.3  HGB 9.7* 9.7* 10.0* 10.1* 9.5*  HCT 29.9* 30.0* 30.8* 32.3* 30.5*  MCV 87.7 87.2 87.7 87.1 87.4  PLT 291 285 321 291 309   Cardiac Enzymes: No results for input(s): CKTOTAL, CKMB, CKMBINDEX, TROPONINI in the last 168 hours. D-Dimer No results for input(s): DDIMER in the last 72 hours. BNP: Invalid input(s): POCBNP CBG: Recent Labs  Lab 02/18/20 1823  GLUCAP 104*   Anemia work up No results for input(s): VITAMINB12, FOLATE, FERRITIN, TIBC, IRON, RETICCTPCT in the last 72 hours. Urinalysis    Component Value Date/Time   COLORURINE YELLOW 02/18/2020 1317   APPEARANCEUR CLEAR 03/05/2020 1317   LABSPEC 1.019 03/03/2020 1317   PHURINE 5.0 02/21/2020 1317   GLUCOSEU NEGATIVE 03/01/2020 1317   HGBUR NEGATIVE 03/07/2020 1317   BILIRUBINUR NEGATIVE 03/02/2020 1317   KETONESUR 80 (A) 03/06/2020 1317   PROTEINUR NEGATIVE 02/22/2020 1317   NITRITE NEGATIVE 03/07/2020 1317   LEUKOCYTESUR NEGATIVE 02/13/2020 1317   Sepsis Labs Invalid input(s): PROCALCITONIN,  WBC,  LACTICIDVEN     SIGNED:  Tawni Millers, MD  [facility] 02/09/2020, 3:27 PM Pager   If 7PM-7AM, please contact night-coverage [email or web address] Password [email or web address]

## 2020-03-08 NOTE — Consult Note (Addendum)
Consultation Note Date: 28-Feb-2020   Patient Name: Ivan Daniels  DOB: 1945/07/02  MRN: [id number]  Age / Sex: 74 y.o., male  PCP: Ivan Broad, FNP Referring Physician: Tawni Daniels  Reason for Consultation: Establishing goals of care and Terminal Care  HPI/Patient Profile: 74 y.o. male  with past medical history of non-small cell lung cancer diagnosed 2016 with recurrence 2020 on chemotherapy, HLD, schizophrenia, BPH, anxiety, anemia admitted on 03/05/2020 with nausea, vomiting and weakness. Reported on admission significant decrease in oral intake and functional status for 2 weeks. Hospital admission secondary to right lower lobe aspiration pneumonia complicated with right empyema. GI evaluation for esophageal stricture. Unsuccessful thoracentesis likely due to loculated effusion. CT surgery recommended IR CT guided drainage of loculated effusion. On 10/13, patient with worsening respiratory status with increased work of breath. Placed on BiPAP.  10/14 patient less responsive with ongoing respiratory distress and tachypnea. Palliative medicine consultation for goals of care.   Clinical Assessment and Goals of Care:  I have reviewed medical records, discussed with care team, and met with patient and sister Ivan Daniels) at bedside. Patient is lethargic on BiPAP. Appears slightly uncomfortable with accessory muscle use and tachypnea, RR 30. Per RN, previous morphine dose did decrease respiratory rate.   I introduced Palliative Medicine as specialized medical care for people living with serious illness. It focuses on providing relief from the symptoms and stress of a serious illness.   Prior to admission, patient living with his sister, Ivan Daniels. Two daughters who live in [place]. Progressive decline in functional and nutritional status in the last few weeks.   Sister, Ivan Daniels spoke with Dr.  Cathlean Daniels this morning along with other family members on speaker. Ivan Daniels understands her brother's condition and poor prognosis.   Ivan Daniels request I speak with his daughters. Provided daughters Ivan Daniels and Ivan Daniels) update on their father's course of hospitalization, diagnoses, interventions, ongoing decline and poor prognosis/concern he will not survive this hospitalization.   Discussed medical recommendation for transition to comfort measures only in order to allow him comfort and symptom management as he nears EOL. Educated on comfort medications and recommendation to initiate continuous morphine infusion. Discussed unrestricted visitor access and allowing family visit before removal of BiPAP. Explained that interventions not aimed at comfort will be discontinued.   Sister and daughters agree with transition to comfort measures only. Daughters will come from [place] this evening. Ivan Daniels states "he put up a good fight." Emotional support provided.  Also provided niece, Ivan Daniels update via telephone by Ivan Daniels's request.  Answered questions and concerns for the family.  Discussed in detail with nursing staff. Updated Dr. Cathlean Daniels on secure epic chat.     SUMMARY OF RECOMMENDATIONS    GOC discussion with patient's sister and two daughter. They understand diagnoses and poor prognosis.  Transition to comfort measures only.   Allow unrestricted visitor access as he is nearing EOL.   Symptom management--see below.  Comfort feeds per patient/family request.  Spiritual care consult for EOL care.   Continue BiPAP until  family arrives and are ready to transition off.   PMT provider will f/u in AM.   Code Status/Advance Care Planning:  DNR  Symptom Management:   Initiate morphine infusion 96m/hr  RN may bolus morphine via infusion [id number] pain/dyspnea/air hunger/tachypnea  Ativan 1mg59m q4h prn anxiety  Robinul 0.2mg 61mq4h prn secretions   Palliative Prophylaxis:    Aspiration, Delirium Protocol, Frequent Pain Assessment, Oral Care and Turn Reposition  Additional Recommendations (Limitations, Scope, Preferences):  Full Comfort Care  Psycho-social/Spiritual:   Desire for further Chaplaincy support: yes  Additional Recommendations: Caregiving  Support/Resources, Compassionate Wean Education and Education on Hospice  Prognosis:   Days if not hours  Discharge Planning: To Be Determined: anticipate hospital death      Primary Diagnoses: Present on Admission: . Intractable nausea and vomiting . Paranoid schizophrenia (HCC) [place] ([place]) [place] have reviewed the medical record, interviewed the patient and family, and examined the patient. The following aspects are pertinent.  Past Medical History:  Diagnosis Date  . ALCOHOL ABUSE 11/12/2009   Qualifier: Diagnosis of  By: Ivan Daniels Daniels   . Anemia   . Anxiety   . BENIGN PROSTATIC HYPERTROPHY, HX OF 12/19/2006   Qualifier: Diagnosis of  By: Ivan Daniels Blood transfusion without reported diagnosis 2017  . Clotting disorder (HCC) [place]  . Depression   . Emphysema of lung (HCC) Ivan Daniels Full code status 02/10/2015  . GERD (gastroesophageal reflux disease)   . Hyperlipidemia   . Lung mass 01/25/2015   3.7 x 3.6 cm RUL spiculated lung mass with 2.0 cm right paratracheal lymph node suspicious for bronchogenic CA  . Non-small cell lung cancer (HCC) Ivan Daniels/16   non small-cell,squamous cell ca RUL  . Paranoid schizophrenia (HCC) [place]6/2011   Qualifier: Diagnosis of  By: Ivan Daniels    . Primary spontaneous pneumothorax, left 01/25/2015  . TOBACCO ABUSE 05/24/2009   Qualifier: Diagnosis of  By: Ivan Done Ivan MilksSocial History   Socioeconomic History  . Marital status: Single    Spouse name: Not on file  . Number of children: Not on file  . Years of education: Not on file  . Highest education  level: Not on file  Occupational History  . Not on file  Tobacco Use  . Smoking status: Former Smoker    Packs/day: 0.25    Years: 52.00    Pack years: 13.00    Types: Cigarettes    Quit date: 01/07/2016    Years since quitting: 4.1  . Smokeless tobacco: Never Used  . Tobacco comment: Peak rate of 4-5 cigarettes per day  Vaping Use  . Vaping Use: Never used  Substance and Sexual Activity  . Alcohol use: Not Currently    Alcohol/week: 0.0 standard drinks  . Drug use: No  . Sexual activity: Never  Other Topics Concern  . Not on file  Social History Narrative   [facility] (05/12/16):   Originally from [place]. [facility] in . 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 Significant dust and cotton dust exposure. No pets currently. No bird or mold exposure.    Social Determinants of Health   Financial Resource Strain:   . Difficulty of Paying Living Expenses: Not on file  Food Insecurity:   . Worried About  Running Out of Food in the Last Year: Not on file  . Ran Out of Food in the Last Year: Not on file  Transportation Needs:   . Lack of Transportation (Medical): Not on file  . Lack of Transportation (Non-Medical): Not on file  Physical Activity:   . Days of Exercise per Week: Not on file  . Minutes of Exercise per Session: Not on file  Stress:   . Feeling of Stress : Not on file  Social Connections:   . Frequency of Communication with Friends and Family: Not on file  . Frequency of Social Gatherings with Friends and Family: Not on file  . Attends Religious Services: Not on file  . Active Member of Clubs or Organizations: Not on file  . Attends Archivist Meetings: Not on file  . Marital Status: Not on file   Family History  Problem Relation Age of Onset  . Breast cancer Sister   . Diabetes Sister   . Hyperlipidemia Sister   . Lung disease Neg Hx   . Colon cancer Neg Hx   . Esophageal cancer Neg Hx   .  Stomach cancer Neg Hx   . Pancreatic cancer Neg Hx   . Prostate cancer Neg Hx   . Rectal cancer Neg Hx    Scheduled Meds: . chlorhexidine  15 mL Mouth Rinse BID  . Chlorhexidine Gluconate Cloth  6 each Topical Daily  . latanoprost  1 drop Both Eyes QHS  . mouth rinse  15 mL Mouth Rinse q12n4p   Continuous Infusions: . morphine     PRN Meds:.acetaminophen **OR** acetaminophen, chlorhexidine, glycopyrrolate, haloperidol lactate, levalbuterol, LORazepam, morphine injection, morphine, ondansetron **OR** ondansetron (ZOFRAN) IV, sodium chloride flush Medications Prior to Admission:  Prior to Admission medications   Medication Sig Start Date End Date Taking? Authorizing Provider  aspirin EC 81 MG tablet Take 81 mg by mouth 2 (two) times daily.   Yes [provider]  benztropine (COGENTIN) 0.5 MG tablet Take 0.5 mg by mouth daily.    Yes [provider]  chlorhexidine (PERIDEX) 0.12 % solution 5 mLs by Mouth Rinse route as needed (gingivitis).  06/15/15  Yes [provider]  clopidogrel (PLAVIX) 75 MG tablet Take 75 mg by mouth daily.  12/09/12  Yes [provider]  ferrous fumarate (HEMOCYTE - 106 MG FE) 325 (106 FE) MG TABS tablet Take 1 tablet by mouth daily.    Yes [provider]  haloperidol (HALDOL) 5 MG tablet Take 5 mg by mouth at bedtime.  02/21/13  Yes [provider]  ondansetron (ZOFRAN) 8 MG tablet Take 1 tablet (8 mg total) by mouth every 8 (eight) hours as needed for nausea or vomiting. 01/08/19  Yes Curt Bears, MD  simvastatin (ZOCOR) 20 MG tablet Take 20 mg by mouth at bedtime.  02/14/13  Yes [provider]  umeclidinium-vilanterol (ANORO ELLIPTA) 62.5-25 MCG/INH AEPB Inhale 1 puff into the lungs daily. 04/11/19  Yes Tanda Rockers, MD  VYZULTA 0.024 % SOLN Place 1 drop into both eyes daily.  09/19/19  Yes [provider]  NONFORMULARY OR COMPOUNDED [place]  Combination Pain Cream -    Baclofen 2%, Doxepin 5%, Gabapentin 6%, Topiramate 2%, Pentoxifylline 3% Apply 1-2 grams to affected area 3-4 times daily Qty. 120 gm 3 refills Patient not taking: Reported on 02/14/2020 07/03/17   Gardiner Barefoot, DPM   Allergies  Allergen Reactions  . Benztropine Mesylate     REACTION: decrease  ROM neck. Pt states he is not allergic   . Chlorpromazine Hcl     Unknown/ per pt causes him to draw up   Review of Systems  Unable to perform ROS: Acuity of condition   Physical Exam Vitals and nursing note reviewed.  Constitutional:      Appearance: He is cachectic. He is ill-appearing.  HENT:     Head: Normocephalic and atraumatic.  Cardiovascular:     Rate and Rhythm: Tachycardia present.  Pulmonary:     Effort: Tachypnea and accessory muscle usage present.     Comments: BiPAP Abdominal:     Tenderness: There is no abdominal tenderness.  Skin:    General: Skin is warm and dry.  Neurological:     Mental Status: He is lethargic.    Vital Signs: BP 102/64   Pulse (!) 127   Temp 98.8 F (37.1 C) (Axillary)   Resp (!) 26   Ht _0  (1.803 m)   Wt 66.7 kg   SpO2 100%   BMI 20.51 kg/m  Pain Scale: 0-10   Pain Score: Asleep   SpO2: SpO2: 100 % O2 Device:SpO2: 100 % O2 Flow Rate: .O2 Flow Rate (L/min): 2 L/min  IO: Intake/output summary:   Intake/Output Summary (Last 24 hours) at 2020-02-26 1711 Last data filed at 2020-02-26 1537 Gross per 24 hour  Intake 1829.61 ml  Output 3400 ml  Net -1570.39 ml    LBM: Last BM Date:  (UTA) Baseline Weight: Weight: 66.7 kg Most recent weight: Weight: 66.7 kg     Palliative Assessment/Data:  PPS 20%      Time Total: 60 Greater than 50%  of this time was spent counseling and coordinating care related to the above assessment and plan.  Signed by:  Ihor Dow, DNP, FNP-C Palliative Medicine Team  Phone: [phone] Fax: [phone]   Please contact Palliative Medicine Team phone at [phone] for questions and  concerns.  For individual provider: See Shea Evans

## 2020-03-08 NOTE — Progress Notes (Signed)
PT Cancellation Note  Patient Details Name: Ivan Daniels MRN: [id number] DOB: December 11, 1945   Cancelled Treatment:    Reason Eval/Treat Not Completed: (P) Medical issues which prohibited therapy (Pt currently on BIPAP, elevated temp, increased RR/WOB, he is not responding to VCs.  RN deferred tx at this time.  Will f/u when patient is appropriate.)   Charlsey Moragne J Stann Mainland 2020-03-19, 11:46 AM  Erasmo Leventhal , PTA Acute Rehabilitation Services Pager [phone] Office [phone]

## 2020-03-08 NOTE — Progress Notes (Signed)
Rectal tylenol given, per order, for temp of 101. MD notified of pt's worsening condition. Advised to give 2mg  IV morphine and monitor pts status. If pt continues to worsen, MD will speak again to family about goals of care.

## 2020-03-08 NOTE — Progress Notes (Signed)
Bipap was removed from the patient and oxygen saturation was monitored until consistant. Stable sat at 93% on RA, started on [place] at 2L for comfort. Patient in no acute distress. Will continue to monitor.   Donnelly Angelica, RN

## 2020-03-08 NOTE — Progress Notes (Signed)
Pts status worsened, nonverbal. Please place palliative consult for family education and comfort care if appropriate.

## 2020-03-08 NOTE — Progress Notes (Addendum)
64ml of morphine remaining in bag. Wasted into stericycle with Teola Bradley RN as witness. Adella Hare, RN

## 2020-03-08 DEATH — deceased

## 2020-03-10 ENCOUNTER — Ambulatory Visit: Payer: Medicare Other | Admitting: Internal Medicine

## 2020-03-12 ENCOUNTER — Ambulatory Visit: Payer: Medicare Other | Admitting: Podiatry

## 2020-03-19 ENCOUNTER — Ambulatory Visit: Payer: Medicare Other | Admitting: Internal Medicine

## 2020-06-30 NOTE — H&P (Incomplete)
Behavioral Health Medical Screening Exam  Ivan Daniels is an 75 y.o. child transgender to male and prefers "He/Him" pronouns. Patient presented as a walk-in to [facility]. He is accompanied by his mother. Patient consented to interview/assessment with his mother present and participating.   Total Time spent with patient: {Time; 15 min - 8 hours:17441}  Psychiatric Specialty Exam: Physical Exam Vitals and nursing note reviewed.  Constitutional:      Appearance: Normal appearance.  HENT:     Head: Normocephalic and atraumatic.  Eyes:     General:        Right eye: No discharge.        Left eye: No discharge.     Conjunctiva/sclera: Conjunctivae normal.  Cardiovascular:     Rate and Rhythm: Normal rate.  Pulmonary:     Effort: Pulmonary effort is normal. No respiratory distress.  Musculoskeletal:     Cervical back: No rigidity.  Neurological:     Mental Status: He is alert and oriented to person, place, and time.  Psychiatric:        Attention and Perception: Attention normal. He is attentive. He does not perceive auditory or visual hallucinations.        Mood and Affect: Mood is depressed.        Speech: Speech normal.        Behavior: Behavior normal. Behavior is cooperative.        Thought Content: Thought content is not paranoid or delusional. Thought content includes suicidal ideation. Thought content does not include homicidal ideation. Thought content does not include homicidal or suicidal plan.        Cognition and Memory: Cognition normal.        Judgment: Judgment normal.    Review of Systems  Constitutional: Negative.   Eyes: Negative for pain, discharge and redness.  Respiratory: Negative for cough, choking and shortness of breath.   Cardiovascular: Negative for chest pain.  Gastrointestinal: Negative for vomiting.  Skin: Negative for color change.  Neurological: Negative for speech difficulty.  Psychiatric/Behavioral: Positive for suicidal ideas. Negative for agitation  and behavioral problems. The patient is nervous/anxious.    Blood pressure (!) 134/71, pulse (!) 114, temperature 98.7 F (37.1 C), temperature source Oral, resp. rate 20, SpO2 99 %.There is no height or weight on file to calculate BMI. General Appearance: {Appearance:22683} Eye Contact:  {BHH EYE CONTACT:22684} Speech:  {Speech:22685} Volume:  {Volume (PAA):22686} Mood:  {BHH MOOD:22306} Affect:  {Affect (PAA):22687} Thought Process:  {Thought Process (PAA):22688} Orientation:  {BHH ORIENTATION (PAA):22689} Thought Content:  {Thought Content:22690} Suicidal Thoughts:  {ST/HT (PAA):22692} Homicidal Thoughts:  {ST/HT (PAA):22692} Memory:  {BHH MEMORY:22881} Judgement:  {Judgement (PAA):22694} Insight:  {Insight (PAA):22695} Psychomotor Activity:  {Psychomotor (PAA):22696} Concentration: {Concentration:21399} Recall:  {BHH GOOD/FAIR/POOR:22877} Fund of Knowledge:{BHH GOOD/FAIR/POOR:22877} Language: {BHH GOOD/FAIR/POOR:22877} Akathisia:  {BHH YES OR NO:22294} Handed:  {Handed:22697} AIMS (if indicated):    Assets:  {Assets (PAA):22698} Sleep:     Musculoskeletal: Strength & Muscle Tone: {desc; muscle tone:32375} Gait & Station: {PE GAIT ED NATL:22525} Patient leans: {Patient Leans:21022755}  Blood pressure (!) 134/71, pulse (!) 114, temperature 98.7 F (37.1 C), temperature source Oral, resp. rate 20, SpO2 99 %.  Recommendations: {BHH MSE Recommendations:304701}  Ophelia Shoulder, NP 06/30/2020, 11:59 PM

## 2023-03-28 NOTE — Telephone Encounter (Signed)
Telephone call
